# Patient Record
Sex: Male | Born: 1940 | Race: Black or African American | Hispanic: No | Marital: Single | Smoking: Never smoker
Health system: Southern US, Community
[De-identification: ages and names within clinical notes are randomized; demographics above are authoritative.]

## PROBLEM LIST (undated history)

## (undated) DIAGNOSIS — F329 Major depressive disorder, single episode, unspecified: Secondary | ICD-10-CM

## (undated) DIAGNOSIS — D649 Anemia, unspecified: Secondary | ICD-10-CM

## (undated) DIAGNOSIS — K7460 Unspecified cirrhosis of liver: Secondary | ICD-10-CM

## (undated) DIAGNOSIS — I10 Essential (primary) hypertension: Secondary | ICD-10-CM

## (undated) DIAGNOSIS — R092 Respiratory arrest: Secondary | ICD-10-CM

## (undated) DIAGNOSIS — F0390 Unspecified dementia without behavioral disturbance: Secondary | ICD-10-CM

## (undated) DIAGNOSIS — F1010 Alcohol abuse, uncomplicated: Secondary | ICD-10-CM

## (undated) DIAGNOSIS — T783XXA Angioneurotic edema, initial encounter: Secondary | ICD-10-CM

## (undated) DIAGNOSIS — F32A Depression, unspecified: Secondary | ICD-10-CM

## (undated) DIAGNOSIS — E119 Type 2 diabetes mellitus without complications: Secondary | ICD-10-CM

## (undated) DIAGNOSIS — Z8719 Personal history of other diseases of the digestive system: Secondary | ICD-10-CM

## (undated) DIAGNOSIS — E7800 Pure hypercholesterolemia, unspecified: Secondary | ICD-10-CM

## (undated) DIAGNOSIS — Z8674 Personal history of sudden cardiac arrest: Secondary | ICD-10-CM

## (undated) DIAGNOSIS — F419 Anxiety disorder, unspecified: Secondary | ICD-10-CM

## (undated) DIAGNOSIS — I4891 Unspecified atrial fibrillation: Secondary | ICD-10-CM

## (undated) HISTORY — DX: Depression, unspecified: F32.A

## (undated) HISTORY — DX: Anxiety disorder, unspecified: F41.9

## (undated) HISTORY — DX: Type 2 diabetes mellitus without complications: E11.9

## (undated) HISTORY — DX: Major depressive disorder, single episode, unspecified: F32.9

---

## 2012-02-20 DIAGNOSIS — F1010 Alcohol abuse, uncomplicated: Secondary | ICD-10-CM

## 2012-02-20 HISTORY — DX: Alcohol abuse, uncomplicated: F10.10

## 2012-05-20 DIAGNOSIS — T783XXA Angioneurotic edema, initial encounter: Secondary | ICD-10-CM

## 2012-05-20 DIAGNOSIS — Z8674 Personal history of sudden cardiac arrest: Secondary | ICD-10-CM

## 2012-05-20 DIAGNOSIS — I4891 Unspecified atrial fibrillation: Secondary | ICD-10-CM

## 2012-05-20 DIAGNOSIS — R092 Respiratory arrest: Secondary | ICD-10-CM

## 2012-05-20 HISTORY — DX: Personal history of sudden cardiac arrest: Z86.74

## 2012-05-20 HISTORY — DX: Angioneurotic edema, initial encounter: T78.3XXA

## 2012-05-20 HISTORY — DX: Respiratory arrest: R09.2

## 2012-05-20 HISTORY — DX: Unspecified atrial fibrillation: I48.91

## 2012-05-22 ENCOUNTER — Emergency Department (HOSPITAL_COMMUNITY): Payer: Medicare Other

## 2012-05-22 ENCOUNTER — Encounter (HOSPITAL_COMMUNITY): Payer: Self-pay | Admitting: Anesthesiology

## 2012-05-22 ENCOUNTER — Encounter (HOSPITAL_COMMUNITY): Payer: Self-pay | Admitting: Pulmonary Disease

## 2012-05-22 ENCOUNTER — Inpatient Hospital Stay (HOSPITAL_COMMUNITY)
Admission: EM | Admit: 2012-05-22 | Discharge: 2012-05-30 | DRG: 004 | Disposition: A | Discharge status: Home / Self Care | Payer: Medicare Other | Attending: Pulmonary Disease | Admitting: Pulmonary Disease

## 2012-05-22 DIAGNOSIS — J9600 Acute respiratory failure, unspecified whether with hypoxia or hypercapnia: Secondary | ICD-10-CM | POA: Diagnosis present

## 2012-05-22 DIAGNOSIS — T46905A Adverse effect of unspecified agents primarily affecting the cardiovascular system, initial encounter: Secondary | ICD-10-CM | POA: Diagnosis present

## 2012-05-22 DIAGNOSIS — F1010 Alcohol abuse, uncomplicated: Secondary | ICD-10-CM

## 2012-05-22 DIAGNOSIS — T85898A Other specified complication of other internal prosthetic devices, implants and grafts, initial encounter: Secondary | ICD-10-CM | POA: Diagnosis not present

## 2012-05-22 DIAGNOSIS — Y833 Surgical operation with formation of external stoma as the cause of abnormal reaction of the patient, or of later complication, without mention of misadventure at the time of the procedure: Secondary | ICD-10-CM | POA: Diagnosis not present

## 2012-05-22 DIAGNOSIS — E785 Hyperlipidemia, unspecified: Secondary | ICD-10-CM | POA: Diagnosis present

## 2012-05-22 DIAGNOSIS — J9690 Respiratory failure, unspecified, unspecified whether with hypoxia or hypercapnia: Secondary | ICD-10-CM

## 2012-05-22 DIAGNOSIS — J988 Other specified respiratory disorders: Secondary | ICD-10-CM

## 2012-05-22 DIAGNOSIS — Z430 Encounter for attention to tracheostomy: Secondary | ICD-10-CM

## 2012-05-22 DIAGNOSIS — J384 Edema of larynx: Secondary | ICD-10-CM | POA: Diagnosis present

## 2012-05-22 DIAGNOSIS — F1090 Alcohol use, unspecified, uncomplicated: Secondary | ICD-10-CM | POA: Diagnosis present

## 2012-05-22 DIAGNOSIS — I10 Essential (primary) hypertension: Secondary | ICD-10-CM

## 2012-05-22 DIAGNOSIS — Z7289 Other problems related to lifestyle: Secondary | ICD-10-CM | POA: Diagnosis present

## 2012-05-22 DIAGNOSIS — R061 Stridor: Secondary | ICD-10-CM | POA: Diagnosis present

## 2012-05-22 DIAGNOSIS — T783XXA Angioneurotic edema, initial encounter: Principal | ICD-10-CM | POA: Diagnosis present

## 2012-05-22 DIAGNOSIS — E7800 Pure hypercholesterolemia, unspecified: Secondary | ICD-10-CM | POA: Diagnosis present

## 2012-05-22 DIAGNOSIS — R1311 Dysphagia, oral phase: Secondary | ICD-10-CM | POA: Diagnosis not present

## 2012-05-22 HISTORY — DX: Essential (primary) hypertension: I10

## 2012-05-22 HISTORY — PX: TRACHEOSTOMY: SUR1362

## 2012-05-22 HISTORY — DX: Pure hypercholesterolemia, unspecified: E78.00

## 2012-05-22 LAB — URINALYSIS, ROUTINE W REFLEX MICROSCOPIC
Bilirubin Urine: NEGATIVE
Nitrite: NEGATIVE
Specific Gravity, Urine: 1.018 (ref 1.005–1.030)
Urobilinogen, UA: 1 mg/dL (ref 0.0–1.0)
pH: 5 (ref 5.0–8.0)

## 2012-05-22 LAB — CBC WITH DIFFERENTIAL/PLATELET
HCT: 33 % — ABNORMAL LOW (ref 39.0–52.0)
Hemoglobin: 11.4 g/dL — ABNORMAL LOW (ref 13.0–17.0)
Lymphocytes Relative: 38 % (ref 12–46)
Lymphs Abs: 3.7 10*3/uL (ref 0.7–4.0)
MCHC: 34.5 g/dL (ref 30.0–36.0)
Monocytes Absolute: 0.8 10*3/uL (ref 0.1–1.0)
Monocytes Relative: 8 % (ref 3–12)
Neutro Abs: 5 10*3/uL (ref 1.7–7.7)
Neutrophils Relative %: 52 % (ref 43–77)
RBC: 3.59 MIL/uL — ABNORMAL LOW (ref 4.22–5.81)
WBC: 9.7 10*3/uL (ref 4.0–10.5)

## 2012-05-22 LAB — BLOOD GAS, ARTERIAL
Acid-base deficit: 2.3 mmol/L — ABNORMAL HIGH (ref 0.0–2.0)
Drawn by: 34779
FIO2: 1 %
MECHVT: 510 mL
O2 Saturation: 100 %
RATE: 16 resp/min

## 2012-05-22 LAB — COMPREHENSIVE METABOLIC PANEL
Albumin: 3.5 g/dL (ref 3.5–5.2)
Alkaline Phosphatase: 94 U/L (ref 39–117)
BUN: 24 mg/dL — ABNORMAL HIGH (ref 6–23)
CO2: 21 mEq/L (ref 19–32)
Chloride: 96 mEq/L (ref 96–112)
Creatinine, Ser: 1.24 mg/dL (ref 0.50–1.35)
GFR calc non Af Amer: 57 mL/min — ABNORMAL LOW (ref >90)
Glucose, Bld: 117 mg/dL — ABNORMAL HIGH (ref 70–99)
Potassium: 3.4 mEq/L — ABNORMAL LOW (ref 3.5–5.1)
Total Bilirubin: 0.9 mg/dL (ref 0.3–1.2)

## 2012-05-22 LAB — URINE MICROSCOPIC-ADD ON

## 2012-05-22 LAB — PRO B NATRIURETIC PEPTIDE: Pro B Natriuretic peptide (BNP): 22.4 pg/mL (ref 0–125)

## 2012-05-22 LAB — PROTIME-INR
INR: 1.23 (ref 0.00–1.49)
Prothrombin Time: 15.3 seconds — ABNORMAL HIGH (ref 11.6–15.2)

## 2012-05-22 MED ORDER — MIDAZOLAM HCL 2 MG/2ML IJ SOLN
2.0000 mg | INTRAMUSCULAR | Status: DC | PRN
  Administered 2012-05-23: 1 mg via INTRAVENOUS
  Administered 2012-05-24: 2 mg via INTRAVENOUS
  Filled 2012-05-22: qty 2

## 2012-05-22 MED ORDER — PROPOFOL 10 MG/ML IV EMUL
INTRAVENOUS | Status: AC
  Administered 2012-05-22: 18:00:00
  Filled 2012-05-22: qty 100

## 2012-05-22 MED ORDER — EPINEPHRINE 0.15 MG/0.3ML IJ DEVI
INTRAMUSCULAR | Status: AC
  Filled 2012-05-22: qty 0.3

## 2012-05-22 MED ORDER — SODIUM CHLORIDE 0.9 % IV SOLN
INTRAVENOUS | Status: DC
  Administered 2012-05-22: 23:00:00 via INTRAVENOUS
  Filled 2012-05-22 (×6): qty 1000

## 2012-05-22 MED ORDER — MIDAZOLAM HCL 2 MG/2ML IJ SOLN
INTRAMUSCULAR | Status: AC
  Administered 2012-05-22: 2 mg
  Filled 2012-05-22: qty 2

## 2012-05-22 MED ORDER — PANTOPRAZOLE SODIUM 40 MG IV SOLR
40.0000 mg | Freq: Every day | INTRAVENOUS | Status: DC
  Administered 2012-05-22 – 2012-05-27 (×6): 40 mg via INTRAVENOUS
  Filled 2012-05-22 (×7): qty 40

## 2012-05-22 MED ORDER — BIOTENE DRY MOUTH MT LIQD
15.0000 mL | Freq: Four times a day (QID) | OROMUCOSAL | Status: DC
  Administered 2012-05-23 – 2012-05-30 (×28): 15 mL via OROMUCOSAL

## 2012-05-22 MED ORDER — SODIUM CHLORIDE 0.9 % IV SOLN: 250.0000 mL | INTRAVENOUS | Status: DC | PRN

## 2012-05-22 MED ORDER — SUCCINYLCHOLINE CHLORIDE 20 MG/ML IJ SOLN
INTRAMUSCULAR | Status: AC
  Filled 2012-05-22: qty 1

## 2012-05-22 MED ORDER — ETOMIDATE 2 MG/ML IV SOLN
INTRAVENOUS | Status: AC
  Filled 2012-05-22: qty 20

## 2012-05-22 MED ORDER — FENTANYL CITRATE 0.05 MG/ML IJ SOLN
INTRAMUSCULAR | Status: AC
  Administered 2012-05-22: 50 ug via INTRAVENOUS
  Filled 2012-05-22: qty 2

## 2012-05-22 MED ORDER — METHYLPREDNISOLONE SODIUM SUCC 125 MG IJ SOLR
INTRAMUSCULAR | Status: AC
  Filled 2012-05-22: qty 2

## 2012-05-22 MED ORDER — PROPOFOL 10 MG/ML IV BOLUS
INTRAVENOUS | Status: AC
  Filled 2012-05-22: qty 20

## 2012-05-22 MED ORDER — LIDOCAINE HCL (CARDIAC) 20 MG/ML IV SOLN
INTRAVENOUS | Status: AC
  Filled 2012-05-22: qty 5

## 2012-05-22 MED ORDER — ROCURONIUM BROMIDE 50 MG/5ML IV SOLN
INTRAVENOUS | Status: AC
  Filled 2012-05-22: qty 2

## 2012-05-22 MED ORDER — FENTANYL CITRATE 0.05 MG/ML IJ SOLN
INTRAMUSCULAR | Status: AC
  Administered 2012-05-22: 100 ug
  Filled 2012-05-22: qty 2

## 2012-05-22 MED ORDER — MIDAZOLAM BOLUS VIA INFUSION: 2.0000 mg | INTRAVENOUS | Status: DC | PRN

## 2012-05-22 MED ORDER — FENTANYL BOLUS VIA INFUSION
25.0000 ug | INTRAVENOUS | Status: DC | PRN
  Filled 2012-05-22: qty 100

## 2012-05-22 MED ORDER — CHLORHEXIDINE GLUCONATE 0.12 % MT SOLN
15.0000 mL | Freq: Two times a day (BID) | OROMUCOSAL | Status: DC
  Administered 2012-05-22 – 2012-05-30 (×16): 15 mL via OROMUCOSAL
  Filled 2012-05-22 (×18): qty 15

## 2012-05-22 MED ORDER — ENOXAPARIN SODIUM 40 MG/0.4ML ~~LOC~~ SOLN
40.0000 mg | SUBCUTANEOUS | Status: DC
  Administered 2012-05-22: 40 mg via SUBCUTANEOUS
  Filled 2012-05-22 (×2): qty 0.4

## 2012-05-22 MED ORDER — FENTANYL CITRATE 0.05 MG/ML IJ SOLN
50.0000 ug | INTRAMUSCULAR | Status: DC | PRN
  Administered 2012-05-23 (×2): 100 ug via INTRAVENOUS
  Administered 2012-05-23 (×2): 50 ug via INTRAVENOUS
  Administered 2012-05-24 – 2012-05-26 (×12): 100 ug via INTRAVENOUS
  Administered 2012-05-27 – 2012-05-28 (×4): 50 ug via INTRAVENOUS
  Administered 2012-05-28 (×4): 100 ug via INTRAVENOUS
  Administered 2012-05-29: 50 ug via INTRAVENOUS
  Administered 2012-05-29 – 2012-05-30 (×3): 100 ug via INTRAVENOUS
  Filled 2012-05-22 (×26): qty 2

## 2012-05-22 MED ORDER — FENTANYL CITRATE 0.05 MG/ML IJ SOLN
50.0000 ug | Freq: Once | INTRAMUSCULAR | Status: DC
  Filled 2012-05-22: qty 2

## 2012-05-22 MED ORDER — MIDAZOLAM HCL 2 MG/2ML IJ SOLN
2.0000 mg | Freq: Once | INTRAMUSCULAR | Status: DC
  Filled 2012-05-22: qty 2

## 2012-05-22 MED ORDER — IOHEXOL 300 MG/ML  SOLN
75.0000 mL | Freq: Once | INTRAMUSCULAR | Status: AC | PRN
  Administered 2012-05-22: 75 mL via INTRAVENOUS

## 2012-05-22 NOTE — ED Notes (Signed)
LMA palced by anesthesia.  Pt being prepped for trach at this time per Dr.Thompson.

## 2012-05-22 NOTE — ED Notes (Signed)
Pt resting quietly will respond and answer questions appropriately by nodding his head.  Pt's wife at bedside.

## 2012-05-22 NOTE — ED Notes (Signed)
 NS infusion complete, 2nd liter infusing at 125/hr.

## 2012-05-22 NOTE — ED Notes (Signed)
Pt hypotensive, Diprovan paused at this time per Dr. Janee Morn.  IV NS bolus infusing

## 2012-05-22 NOTE — ED Notes (Addendum)
Attempting to place LMA at this time by anesthesia.

## 2012-05-22 NOTE — ED Notes (Signed)
Pt being put in soft restraints.  Time out by Dr. Janee Morn and Dr. Bernette Mayers.

## 2012-05-22 NOTE — Progress Notes (Signed)
Called by nurse to ED to suction pt. Suction moderate bloody secretions from pt. Vitals are stable HR 101 RR 28 Oxygen Saturation 100%. RT will continue to monitor pt.

## 2012-05-22 NOTE — Procedures (Signed)
Brief operative note  Preoperative diagnosis: Upper airway obstruction with need for emergent airway Postoperative diagnosis:1. Upper airway obstruction with need for emergent airway     2. Significant edema within the structures of the neck Procedure: Tracheostomy a #6 Shiley Surgeon: Violeta Gelinas, M.D. Assistant: Karie Soda, M.D. Procedure: Please refer to my consult note. The patient presented with stridor to the emergency department and anesthesia was unable to secure an airway. Emergency tracheostomy was done. Emergency consent was done and we did a time out procedure. Anterior neck was prepped in a sterile fashion. 1.5% lidocaine from the [employer] trach kit was used to infiltrate 2 cm cephalad to the sternal notch. His neck is very edematous. Vertical incision was made over the midline. Subcutaneous tissues were dissected down sharply through the platysma. Anterior jugular veins were quite distended. Neck anatomy was distorted due to significant edema. Strap muscles were split sharply and the anterior surface of the trachea was exposed. Patient was hemodynamically unstable. Blue Rhino kit was used and the Angiocath was inserted into the trachea. We got into her back. Guidewire was threaded. Trachea is dilated with a small blue dilator followed by the Blue Rhino dilator. Next, we did place a #6 trach over a 24 [place] dilator. It did not thread. It was removed. Trachea was reexposed. Incision was made in the trachea itself around the second tracheal ring this was dilated with with a Kelly clamp and a endotracheal tube was placed temporarily. We were able to bag came to achieve better saturations. When that improved, endotracheal tube was removed and the #6 Shiley was placed over 24 French Blue Rhino dilator. Cuff was inflated.We had good breath sounds and air return on the ventilator. Saturations improved into the 90s. A trach dressing was placed and the tracheostomy was sutured to the skin with  2-0 Prolene's. Velcro trach tie was applied. Quarter-inch iodoform was packed on either side to assist with hemostasis. He continued to have good ventilation via the trach. Chest x-ray showed pulmonary edema possibly from negative pressure, tracheostomy in place. Some possible subcutaneous emphysema but no pneumothorax. Patient will be evaluated and admitted to the critical care medicine service. We will follow him closely. His upper airway limited to be further evaluated to see what the cause of all of this is and swallowing study or other evaluation of his esophagus will need to be done before oral intake should be allowed.  Violeta Gelinas, MD, MPH, FACS Pager: [phone]

## 2012-05-22 NOTE — H&P (Signed)
PULMONARY  / CRITICAL CARE MEDICINE  Name: Keith [name] MRN: [id number] DOB: 1940/12/17    ADMISSION DATE:  05/22/2012 CONSULTATION DATE:  05/22/2012  REFERRING MD :  Bernette Mayers PRIMARY SERVICE: PCCM  CHIEF COMPLAINT:  Angioedema  BRIEF PATIENT DESCRIPTION: 72 y/o male with acute onset stridor on 4/3 required emergent tracheostomy in the [facility] ED.  SIGNIFICANT EVENTS / STUDIES:  4/3 emergent tracheostomy in ED >> 4/3 CT neck/soft tissue with contrast >>  LINES / TUBES: 4/3 Tracheostomy >>  CULTURES:   ANTIBIOTICS:    HISTORY OF PRESENT ILLNESS:  72 y/o male on lisinopril for years for HTN developed the sensation of "something in his throat" on 4/3.  This progressed rapidly over the course of three hours and he developed stridor and respiratory failure.  In the [facility] ED he was found to have laryngeal swelling, but no tongue swelling.  He never developed cardiac arrest and his hypoxemia was able to be corrected with bag mask ventilation.  An LMA was placed and an emergent tracheostomy was performed by Dr. Janee Morn of Trauma Surgery in the ED.   His fiance is with him and states that he has been feeling well lately.  She was with him nearly all day and reported no new medications, foods, insect stings or animal contact.  He did not have itching or a rash during the episode.  PAST MEDICAL HISTORY :  Past Medical History  Diagnosis Date  . HTN (hypertension)   . Hypercholesterolemia    Past Surgical History  Procedure Laterality Date  . Tracheostomy  05/22/12    emergent, angioedema   Prior to Admission medications   Medication Sig Start Date End Date Taking? Authorizing Provider  atenolol (TENORMIN) 50 MG tablet Take 50 mg by mouth every morning.   Yes Historical Provider, MD  atorvastatin (LIPITOR) 40 MG tablet Take 40 mg by mouth every morning.   Yes Historical Provider, MD  ipratropium (ATROVENT) 0.06 % nasal spray Place 2 sprays into the nose 4 (four) times daily.   Yes  Historical Provider, MD  lisinopril (PRINIVIL,ZESTRIL) 20 MG tablet Take 20 mg by mouth every morning.   Yes Historical Provider, MD  mometasone (NASONEX) 50 MCG/ACT nasal spray Place 2 sprays into the nose daily.   Yes Historical Provider, MD   No Known Allergies  FAMILY HISTORY:  Reviewed, not relevant to current illness SOCIAL HISTORY:  reports that he drinks about 7.0 ounces of alcohol per week. He reports that he does not use illicit drugs. His tobacco history is not on file.  REVIEW OF SYSTEMS:  Cannot obtain due to intubation  SUBJECTIVE:   VITAL SIGNS: Temp:  [96.8 F (36 C)] 96.8 F (36 C) (04/03 1900) Pulse Rate:  [77-125] 96 (04/03 1900) Resp:  [9-29] 16 (04/03 1900) BP: (65-207)/(40-107) 94/49 mmHg (04/03 1900) SpO2:  [52 %-100 %] 99 % (04/03 1900) FiO2 (%):  [100 %] 100 % (04/03 1800) HEMODYNAMICS:   VENTILATOR SETTINGS: Vent Mode:  [-] PRVC FiO2 (%):  [100 %] 100 % Set Rate:  [16 bmp] 16 bmp Vt Set:  [550 mL] 550 mL PEEP:  [5 cmH20] 5 cmH20 Plateau Pressure:  [19 cmH20] 19 cmH20 INTAKE / OUTPUT: Intake/Output     04/03 0701 - 04/04 0700   I.V. 1000   Total Intake 1000   Net +1000         PHYSICAL EXAMINATION:  Gen: resting comfortably on vent HEENT: NCAT, PERRL, EOMi, Tracheostomy in place, slight oozing; no  tongue swelling, some blood in mouth, no clear pharyngeal bleeding PULM: Rhonchi bilaterally CV: RRR, no mgr, no JVD AB: BS+, soft, nontender, no hsm Ext: warm, trace edema, no clubbing, no cyanosis Derm: no rash or skin breakdown Neuro: Sedated on vent but following commands, interacting with staff; maew   LABS:  Recent Labs Lab 05/22/12 1816 05/22/12 1817 05/22/12 1831  HGB 11.4*  --   --   WBC 9.7  --   --   PLT 130*  --   --   NA 131*  --   --   K 3.4*  --   --   CL 96  --   --   CO2 21  --   --   GLUCOSE 117*  --   --   BUN 24*  --   --   CREATININE 1.24  --   --   CALCIUM 8.6  --   --   AST 106*  --   --   ALT 34  --    --   ALKPHOS 94  --   --   BILITOT 0.9  --   --   PROT 8.5*  --   --   ALBUMIN 3.5  --   --   APTT 37  --   --   INR 1.23  --   --   LATICACIDVEN  --   --  2.80*  TROPONINI  --  <0.30  --   PROBNP  --  22.4  --    No results found for this basename: GLUCAP,  in the last 168 hours  4/3 CXR: bilateral airspace disease, sub cutaneous emphysema left neck, question pneumomediastinum  ASSESSMENT / PLAN:  PULMONARY A: Stridor presumably due to lisinopril, but given question of lesions in throat seen with laryngoscopy will order CT neck to rule out a fluid/blood/pus collection; no urticaria to suggest histamine mediated (ie allergic) angioedema  Negative pressure pulmonary edema improving P:   -CT neck with contrast -tracheostomy care per surgery -full vent support overnight -SBT in AM -trypsin level   CARDIOVASCULAR A: HTN P:  -no more Lisinopril -restart other home meds 4/4  RENAL A:  No acute issues P:   -monitor UOP  GASTROINTESTINAL A:  High risk for esophageal/pharyngeal injury from repeated laryngoscopy attempts P:   -CT neck -hold OG tube 4/3 (MD could not pass in ED) -consider barium swallow before he is allowed to eat  HEMATOLOGIC A: Some oozing from trach P:  -monitor  INFECTIOUS A:  No acute issues P:   -check CT neck to look for abscess  ENDOCRINE A:  No acute issues P:   -monitor glucose  NEUROLOGIC A:   Despite respiratory failure and hypoxemia, he is following commands and interacting normally 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  Lisinopril will be our diagnosis of exclusion if his CT neck is negative.  He has no urticaria to suggest a histamine mediated process.    I have personally obtained a history, examined the patient, evaluated laboratory and imaging results, formulated the assessment and plan  and placed orders. CRITICAL CARE: The patient is critically ill with multiple organ systems failure and requires high complexity decision making for assessment and support, frequent evaluation and titration of therapies, application of advanced monitoring technologies and extensive interpretation of multiple databases. Critical Care  Time devoted to patient care services described in this note is 60 minutes.   Fonnie Jarvis Pulmonary and Critical Care Medicine [facility] Pager: [phone]  05/22/2012, 7:58 PM

## 2012-05-22 NOTE — ED Notes (Signed)
Pt continues to be assisted with ventilations by bag valve mask.

## 2012-05-22 NOTE — ED Notes (Signed)
EMS reports pt st's he was walking in [place] when he had sudden onset of resp. Difficulty.  Pt then drove himself home and called EMS.  On arrival pt is resp. Distress with Epi neb being given.  Pt was also given Benadryl 50mg  IV.  Pt would not tolerate mask.  On arrival to ED pt very anxious with strider.

## 2012-05-22 NOTE — ED Notes (Signed)
#   6 trach placed by Dr. Janee Morn.

## 2012-05-22 NOTE — Consult Note (Signed)
Airway/Intubation Note Called by ED attending physician to evaluate and intubate patient in respiratory distress. Upon arrival noted patient in respiratory distress. ED staff had initiated local anesthesia by spray. By glide scope noted significant airway swelling and edema. Unable to get a clear view of the vocal cords with glide scope and bloody airway noted with no clear area of bleeding observed. Elected not to pass tube blindly with edema and bleeding.  #4  Supreme LMA utilized successfully to maintain airway with ability to maintain O2 sats in 90s occasionally 100%. ED team had simultaneously called trauma for possible trach. After unsuccsessful glide scope attempts Dr. Janee Morn and Dr. Michaell Cowing did a trach while maintaining airway with Supreme LMA. After trach secured. Positive ETCO2 with EZ Cap. BLBS. Tube secured. Plan Care per ED team. Judie Petit MD Anesthesia

## 2012-05-22 NOTE — ED Provider Notes (Signed)
History     CSN: [id number]  Arrival date & time 05/22/12  1712   First MD Initiated Contact with Patient 05/22/12 1803      Chief Complaint  Patient presents with  . Respiratory Distress    (Consider location/radiation/quality/duration/timing/severity/associated sxs/prior treatment) HPI - Level 5 caveat due to urgent need for intevention Pt brought to the ED via EMS who provides history of worsening SOB over the course of about 3 hours while at [facility] this afternoon. On their arrival patient was wheezing, given a neb but then progressed to stridor and hypoxia. Given Benadryl and Racemic epi which helped some initially, but SpO2 and mental status continued to deteriorate prior to arrival. On arrival to the resuscitation bay, he was minimally responsive with stridor and SpO2 60%.   No past medical history on file.  No past surgical history on file.  No family history on file.  History  Substance Use Topics  . Smoking status: Not on file  . Smokeless tobacco: Not on file  . Alcohol Use: Not on file      Review of Systems Unable to assess due to mental status.   Allergies  Review of patient's allergies indicates no known allergies.  Home Medications   Current Outpatient Rx  Name  Route  Sig  Dispense  Refill  . atenolol (TENORMIN) 50 MG tablet   Oral   Take 50 mg by mouth every morning.         Marland Kitchen atorvastatin (LIPITOR) 40 MG tablet   Oral   Take 40 mg by mouth every morning.         Marland Kitchen ipratropium (ATROVENT) 0.06 % nasal spray   Nasal   Place 2 sprays into the nose 4 (four) times daily.         Marland Kitchen lisinopril (PRINIVIL,ZESTRIL) 20 MG tablet   Oral   Take 20 mg by mouth every morning.         . mometasone (NASONEX) 50 MCG/ACT nasal spray   Nasal   Place 2 sprays into the nose daily.           BP 94/49  Pulse 96  Temp(Src) 96.8 F (36 C)  Resp 16  SpO2 99%  Physical Exam  Nursing note and vitals reviewed. Constitutional: He appears  well-developed and well-nourished.  HENT:  Head: Normocephalic and atraumatic.  Eyes: Pupils are equal, round, and reactive to light.  Neck: No tracheal deviation present.  Cardiovascular: Normal rate, normal heart sounds and intact distal pulses.   Pulmonary/Chest: Stridor present. He is in respiratory distress.  Abdominal: Bowel sounds are normal. He exhibits no distension. There is no tenderness.  Musculoskeletal: Normal range of motion. He exhibits no edema and no tenderness.  Neurological:  Moves all four extremities, unable to fully assess  Skin: Skin is warm and dry. No rash noted.  Psychiatric:  Unable to assess    ED Course  Procedures (including critical care time)  Labs Reviewed  CBC WITH DIFFERENTIAL - Abnormal; Notable for the following:    RBC 3.59 (*)    Hemoglobin 11.4 (*)    HCT 33.0 (*)    Platelets 130 (*)    All other components within normal limits  COMPREHENSIVE METABOLIC PANEL - Abnormal; Notable for the following:    Sodium 131 (*)    Potassium 3.4 (*)    Glucose, Bld 117 (*)    BUN 24 (*)    Total Protein 8.5 (*)    AST 106 (*)  GFR calc non Af Amer 57 (*)    GFR calc Af Amer 66 (*)    All other components within normal limits  PROTIME-INR - Abnormal; Notable for the following:    Prothrombin Time 15.3 (*)    All other components within normal limits  URINALYSIS, ROUTINE W REFLEX MICROSCOPIC - Abnormal; Notable for the following:    Color, Urine AMBER (*)    APPearance HAZY (*)    Hgb urine dipstick MODERATE (*)    Protein, ur 100 (*)    All other components within normal limits  URINE MICROSCOPIC-ADD ON - Abnormal; Notable for the following:    Casts HYALINE CASTS (*)    All other components within normal limits  CG4 I-STAT (LACTIC ACID) - Abnormal; Notable for the following:    Lactic Acid, Venous 2.80 (*)    All other components within normal limits  APTT  TROPONIN I  PRO B NATRIURETIC PEPTIDE   Dg Chest Port 1 View  05/22/2012   *RADIOLOGY REPORT*  Clinical Data: Tracheostomy tube  PORTABLE CHEST - 1 VIEW  Comparison: None.  Findings: Diffuse airspace disease right greater than left. Tracheostomy tube in place.  Tip is 7.5 cm from the carina.  Left supraclavicular subcutaneous emphysema is suspected.  No pneumothorax.  Normal heart size.  Low volumes.  IMPRESSION: Tracheostomy tube in place.  Diffuse airspace disease worrisome for pulmonary edema.  Left supraclavicular subcutaneous emphysema is suspected.  No pneumothorax.   Original Report Authenticated By: Jolaine Click, M.D.      1. Angioedema, initial encounter   2. Respiratory failure   3. Acute tracheostomy management   4. Habitual alcohol use   5. Hyperlipidemia   6. Hypertension   7. Laryngeal edema   8. Acute respiratory failure       MDM  Respiratory at bedside on arrival, began BVM with good improvement in SpO2. Anesthesia was called on arrival and promptly arrived at bedside, Trauma Surg also called for possible trach. Please see their notes for the details of their procedures, but briefly multiple attempts at Glidescope showed markedly edematous larynx and adenoids with poorly visualized land marks. Dr. Janee Morn and Dr. Acquanetta Belling with Surgery then performed bedside tracheostomy. With airway intact now will begin workup for etiology of his airway compromise. Suspect an angioedema reaction to lisinopril, but will also eval for other soft tissue neck mass. Discussed with PCCM who will come down to evaluate the patient as well. Discussed this plan and care to this point with fiance who is at bedside. Pt initially sedated with propofol, but BP dropped. Propofol stopped and given a saline bolus with good improvement. He is tolerating trach well now, responding to verbal stimuli.   CRITICAL CARE Performed by: Pollyann Savoy   Total critical care time: 90  Critical care time was exclusive of separately billable procedures and treating other patients.  Critical  care was necessary to treat or prevent imminent or life-threatening deterioration.  Critical care was time spent personally by me on the following activities: development of treatment plan with patient and/or surrogate as well as nursing, discussions with consultants, evaluation of patient's response to treatment, examination of patient, obtaining history from patient or surrogate, ordering and performing treatments and interventions, ordering and review of laboratory studies, ordering and review of radiographic studies, pulse oximetry and re-evaluation of patient's condition.      Willine Schwalbe B. Bernette Mayers, MD 05/22/12 [phone]

## 2012-05-22 NOTE — ED Notes (Signed)
Proprofol d'cd per Dr. Janee Morn

## 2012-05-22 NOTE — ED Notes (Signed)
Dr. Janee Morn at bedside for possible trach.  Pt's throat swollen unable to visualize vocal cords.

## 2012-05-22 NOTE — Consult Note (Signed)
Reason for Consult:Emergency airway Referring Physician: Judie Petit, MD  Keith [name] is an 72 y.o. male.  HPI: Patient presented to the emergency department with stridor. Anesthesia evaluated him and was unable to get an airway. I was: Emergently for possible tracheostomy. On my arrival, patient's saturations were 1%. Anesthesia was able to place a laryngeal mask airway and bagged him. Saturations went into the 90s. Attempts were made by anesthesia with the glide scope multiple times. No good view was able to be obtained. He has significant edema in his posterior pharynx. It was very friable and bloody as well. He continued episodically desaturating. We were able to secure the laryngeal mask airway to get his saturations into the 90s and felt we needed to proceed with emergency tracheostomy. That is dictated in an operative note.  No past medical history on file.  No past surgical history on file.  No family history on file.  Social History:  has no tobacco, alcohol, and drug history on file.  Allergies: Allergies no known allergies  Medications: Prior to Admission:  (Not in a hospital admission)  Results for orders placed during the hospital encounter of 05/22/12 (from the past 48 hour(s))  CBC WITH DIFFERENTIAL     Status: Abnormal   Collection Time    05/22/12  6:16 PM      Result Value Range   WBC 9.7  4.0 - 10.5 K/uL   RBC 3.59 (*) 4.22 - 5.81 MIL/uL   Hemoglobin 11.4 (*) 13.0 - 17.0 g/dL   HCT 16.1 (*) 09.6 - 04.5 %   MCV 91.9  78.0 - 100.0 fL   MCH 31.8  26.0 - 34.0 pg   MCHC 34.5  30.0 - 36.0 g/dL   RDW 40.9  81.1 - 91.4 %   Platelets 130 (*) 150 - 400 K/uL   Neutrophils Relative 52  43 - 77 %   Neutro Abs 5.0  1.7 - 7.7 K/uL   Lymphocytes Relative 38  12 - 46 %   Lymphs Abs 3.7  0.7 - 4.0 K/uL   Monocytes Relative 8  3 - 12 %   Monocytes Absolute 0.8  0.1 - 1.0 K/uL   Eosinophils Relative 2  0 - 5 %   Eosinophils Absolute 0.2  0.0 - 0.7 K/uL   Basophils Relative 0  0 - 1 %   Basophils Absolute 0.0  0.0 - 0.1 K/uL  COMPREHENSIVE METABOLIC PANEL     Status: Abnormal   Collection Time    05/22/12  6:16 PM      Result Value Range   Sodium 131 (*) 135 - 145 mEq/L   Potassium 3.4 (*) 3.5 - 5.1 mEq/L   Chloride 96  96 - 112 mEq/L   CO2 21  19 - 32 mEq/L   Glucose, Bld 117 (*) 70 - 99 mg/dL   BUN 24 (*) 6 - 23 mg/dL   Creatinine, Ser 7.82  0.50 - 1.35 mg/dL   Calcium 8.6  8.4 - 95.6 mg/dL   Total Protein 8.5 (*) 6.0 - 8.3 g/dL   Albumin 3.5  3.5 - 5.2 g/dL   AST 213 (*) 0 - 37 U/L   ALT 34  0 - 53 U/L   Alkaline Phosphatase 94  39 - 117 U/L   Total Bilirubin 0.9  0.3 - 1.2 mg/dL   GFR calc non Af Amer 57 (*) >90 mL/min   GFR calc Af Amer 66 (*) >90 mL/min   Comment:  The eGFR has been calculated     using the CKD EPI equation.     This calculation has not been     validated in all clinical     situations.     eGFR's persistently     <90 mL/min signify     possible Chronic Kidney Disease.  PROTIME-INR     Status: Abnormal   Collection Time    05/22/12  6:16 PM      Result Value Range   Prothrombin Time 15.3 (*) 11.6 - 15.2 seconds   INR 1.23  0.00 - 1.49  APTT     Status: None   Collection Time    05/22/12  6:16 PM      Result Value Range   aPTT 37  24 - 37 seconds   Comment:            IF BASELINE aPTT IS ELEVATED,     SUGGEST PATIENT RISK ASSESSMENT     BE USED TO DETERMINE APPROPRIATE     ANTICOAGULANT THERAPY.  TROPONIN I     Status: None   Collection Time    05/22/12  6:17 PM      Result Value Range   Troponin I <0.30  <0.30 ng/mL   Comment:            Due to the release kinetics of cTnI,     a negative result within the first hours     of the onset of symptoms does not rule out     myocardial infarction with certainty.     If myocardial infarction is still suspected,     repeat the test at appropriate intervals.  PRO B NATRIURETIC PEPTIDE     Status: None   Collection Time    05/22/12   6:17 PM      Result Value Range   Pro B Natriuretic peptide (BNP) 22.4  0 - 125 pg/mL  CG4 I-STAT (LACTIC ACID)     Status: Abnormal   Collection Time    05/22/12  6:31 PM      Result Value Range   Lactic Acid, Venous 2.80 (*) 0.5 - 2.2 mmol/L    Dg Chest Port 1 View  05/22/2012  *RADIOLOGY REPORT*  Clinical Data: Tracheostomy tube  PORTABLE CHEST - 1 VIEW  Comparison: None.  Findings: Diffuse airspace disease right greater than left. Tracheostomy tube in place.  Tip is 7.5 cm from the carina.  Left supraclavicular subcutaneous emphysema is suspected.  No pneumothorax.  Normal heart size.  Low volumes.  IMPRESSION: Tracheostomy tube in place.  Diffuse airspace disease worrisome for pulmonary edema.  Left supraclavicular subcutaneous emphysema is suspected.  No pneumothorax.   Original Report Authenticated By: Jolaine Click, M.D.     ROS Blood pressure 89/50, pulse 113, resp. rate 16, SpO2 100.00%. Physical Exam  Constitutional: He appears well-developed. He appears distressed.  HENT:  Head: Normocephalic.  Eyes: Pupils are equal, round, and reactive to light.  Neck:  Neck appeared swollen and trachea somewhat deviated to the left side  Cardiovascular: Normal rate.   Respiratory: He is in respiratory distress.  Coarse bilaterally with stridor  Neurological:  Spontaneously moving all extremities, unable to vocalize at all  Skin: Skin is warm.    Assessment/Plan: Need for emergency airway. See operative note.  Heaven Meeker E 05/22/2012, 7:02 PM

## 2012-05-22 NOTE — ED Notes (Signed)
Resp. In to suction pt.  Pt attempting to cough, wife at bedside.

## 2012-05-22 NOTE — ED Notes (Signed)
Anesthesia at bedside.  Pt remains awake and alert.  Pt's resp being assisted with bag valve mask.

## 2012-05-22 NOTE — ED Notes (Signed)
NS bolus infusing in right AC.

## 2012-05-22 NOTE — ED Notes (Signed)
Dr. Janee Morn placing trach at this time O2 sats 60%.

## 2012-05-23 ENCOUNTER — Inpatient Hospital Stay (HOSPITAL_COMMUNITY): Payer: Medicare Other

## 2012-05-23 LAB — BASIC METABOLIC PANEL
CO2: 20 mEq/L (ref 19–32)
Calcium: 8.9 mg/dL (ref 8.4–10.5)
Creatinine, Ser: 1.01 mg/dL (ref 0.50–1.35)
GFR calc non Af Amer: 73 mL/min — ABNORMAL LOW (ref >90)
Glucose, Bld: 136 mg/dL — ABNORMAL HIGH (ref 70–99)

## 2012-05-23 LAB — CBC
MCH: 32.3 pg (ref 26.0–34.0)
MCHC: 35.7 g/dL (ref 30.0–36.0)
MCV: 90.5 fL (ref 78.0–100.0)
Platelets: 107 10*3/uL — ABNORMAL LOW (ref 150–400)
RBC: 3.47 MIL/uL — ABNORMAL LOW (ref 4.22–5.81)

## 2012-05-23 LAB — GLUCOSE, CAPILLARY: Glucose-Capillary: 113 mg/dL — ABNORMAL HIGH (ref 70–99)

## 2012-05-23 LAB — PROTIME-INR: Prothrombin Time: 15.9 seconds — ABNORMAL HIGH (ref 11.6–15.2)

## 2012-05-23 LAB — APTT: aPTT: 41 seconds — ABNORMAL HIGH (ref 24–37)

## 2012-05-23 NOTE — Progress Notes (Signed)
Chaplain visited pt fiancee at Ed consultation room B at the request of a nurse. Pt was being accessed by ED doctors in Trauma A to determine the cause of his sob. Chaplain provided ministry of presence and empathically listened to fiancee's story until she was able to visit with pt in the trauma bay. Pt was later admitted to 2110. Family thanked chaplain for his presence and support. Kelle Darting [phone]  05/22/12 2220  Clinical Encounter Type  Visited With Family  Visit Type Initial;Spiritual support;Critical Care;ED;Trauma  Referral From Nurse  Spiritual Encounters  Spiritual Needs Emotional  Stress Factors  Patient Stress Factors Health changes  Family Stress Factors Other (Comment) (prognosis)

## 2012-05-23 NOTE — Progress Notes (Signed)
Patient is alert ans awake.  Wants ice chips.  Still bleeding around tracheostomy site.  Able to remove previous packing and repack for some control.  External drain dressings applied.  Will reassess in the near future.  Check coags again.  Patient probably has some anterior jugular venous bleeding that cannot be controlled surgically at this point because he needs his airway.  If it continues may need to take the patient to the OR for re-intubation and exploration and control of bleeding.  Marta Lamas. Gae Bon, MD, FACS [phone] Trauma Surgeon

## 2012-05-23 NOTE — Progress Notes (Signed)
Dr Lindie Spruce made aware of bleeding around tracheostomy site and new bleeding in urine.   Dawson Bills, RN

## 2012-05-23 NOTE — Progress Notes (Signed)
Care of pt assumed by Connye Burkitt RN at 3:30am.  Upon assessment of the patient I noticed a change in his urine.  It was obviously bloody in the urimeter and yellow in the foley bag.  MD notified.  Coags normal.  Pt denies trauma or pain.  Alicia Amel RN

## 2012-05-23 NOTE — Progress Notes (Signed)
PULMONARY  / CRITICAL CARE MEDICINE  Name: Keith [name] MRN: [id number] DOB: [birth year]    ADMISSION DATE:  05/22/2012 CONSULTATION DATE:  05/22/2012  REFERRING MD :  Bernette Mayers PRIMARY SERVICE: PCCM  CHIEF COMPLAINT:  Angioedema  BRIEF PATIENT DESCRIPTION: 72 y/o male with acute onset stridor on 4/3 required emergent tracheostomy in the [facility] ED.  SIGNIFICANT EVENTS / STUDIES:  4/3 emergent tracheostomy in ED >> 4/3 CT neck/soft tissue with contrast >>  LINES / TUBES: 4/3 Tracheostomy >>  CULTURES:   ANTIBIOTICS:    HISTORY OF PRESENT ILLNESS:  72 y/o male on lisinopril for years for HTN developed the sensation of "something in his throat" on 4/3.  This progressed rapidly over the course of three hours and he developed stridor and respiratory failure.  In the [facility] ED he was found to have laryngeal swelling, but no tongue swelling.  He never developed cardiac arrest and his hypoxemia was able to be corrected with bag mask ventilation.  An LMA was placed and an emergent tracheostomy was performed by Dr. Janee Morn of Trauma Surgery in the ED.   His fiance is with him and states that he has been feeling well lately.  She was with him nearly all day and reported no new medications, foods, insect stings or animal contact.  He did not have itching or a rash during the episode.   SUBJECTIVE:  He is feeling better.  Tolerating TC Bleeding from trach site, continued ooze   VITAL SIGNS: Temp:  [96.8 F (36 C)-98.5 F (36.9 C)] 98.5 F (36.9 C) (04/04 0800) Pulse Rate:  [77-125] 83 (04/04 0800) Resp:  [9-29] 18 (04/04 0800) BP: (65-207)/(40-107) 131/74 mmHg (04/04 0800) SpO2:  [52 %-100 %] 100 % (04/04 0800) FiO2 (%):  [40 %-100 %] 40 % (04/04 0740) Weight:  [204 lb 2.3 oz (92.6 kg)] 204 lb 2.3 oz (92.6 kg) (04/04 0300) HEMODYNAMICS:   VENTILATOR SETTINGS: Vent Mode:  [-] PRVC FiO2 (%):  [40 %-100 %] 40 % Set Rate:  [16 bmp] 16 bmp Vt Set:  [510 mL-550 mL] 510 mL PEEP:  [5  cmH20] 5 cmH20 Plateau Pressure:  [13 cmH20-30 cmH20] 17 cmH20 INTAKE / OUTPUT: Intake/Output     04/03 0701 - 04/04 0700 04/04 0701 - 04/05 0700   I.V. (mL/kg) 1760 (19)    Total Intake(mL/kg) 1760 (19)    Urine (mL/kg/hr) 1000    Total Output 1000     Net +760            PHYSICAL EXAMINATION: Gen: resting comfortably on trach HEENT: NCAT, PERRL, EOMi, Tracheostomy in place still bleed briskly; no tongue swelling, some old blood in pharynx PULM: Rhonchi bilaterally, decreased breath sound at bases L>>R  CV: RRR, no mgr, no JVD  AB: BS+, soft, nontender, no hsm Ext: warm, no edema, no clubbing, no cyanosis Derm: no rash or skin breakdown  Neuro: alert, asking questions  LABS:  Recent Labs Lab 05/22/12 1816 05/22/12 1817 05/22/12 1831 05/22/12 2245 05/23/12 0555 05/23/12 0635  HGB 11.4*  --   --   --  11.2*  --   WBC 9.7  --   --   --  10.2  --   PLT 130*  --   --   --  107*  --   NA 131*  --   --   --  135  --   K 3.4*  --   --   --  4.3  --  CL 96  --   --   --  100  --   CO2 21  --   --   --  20  --   GLUCOSE 117*  --   --   --  136*  --   BUN 24*  --   --   --  29*  --   CREATININE 1.24  --   --   --  1.01  --   CALCIUM 8.6  --   --   --  8.9  --   AST 106*  --   --   --   --   --   ALT 34  --   --   --   --   --   ALKPHOS 94  --   --   --   --   --   BILITOT 0.9  --   --   --   --   --   PROT 8.5*  --   --   --   --   --   ALBUMIN 3.5  --   --   --   --   --   APTT 37  --   --   --   --  41*  INR 1.23  --   --   --   --  1.30  LATICACIDVEN  --   --  2.80*  --   --   --   TROPONINI  --  <0.30  --   --   --   --   PROBNP  --  22.4  --   --   --   --   PHART  --   --   --  7.323*  --   --   PCO2ART  --   --   --  45.4*  --   --   PO2ART  --   --   --  392.0*  --   --     Recent Labs Lab 05/23/12 0027 05/23/12 0353 05/23/12 0828  GLUCAP 102* 113* 140*    4/3 CXR:  pulm edema improved significantly overnight.  Possible left atelectasis  ASSESSMENT  / PLAN:  PULMONARY A: Stridor presumably due to lisinopril. CT showed diffuse pharyngeal and upper airway edema from an unknown process. no urticaria to suggest histamine mediated (ie allergic) angioedema. Negative pressure pulmonary edema improving seen on CXR. P:   -tracheostomy care per surgery -try trach collar today >> try to go 24x7 -Pulmonary hygiene -speech rx for cuff down and PMV -will ask ENT to eval if cannot tolerate cuff down/PMV  CARDIOVASCULAR A: HTN P:  -avoid Lisinopril -resume home meds once he can tolerate PO  RENAL A:  Mild AKI. Cr 1.24--> 1.01 resolved P:   -monitor UOP  GASTROINTESTINAL A:  High risk for esophageal/pharyngeal injury from repeated laryngoscopy attempt. CT neck showed diffuse pharyngeal and upper airway edema from an unknown process. P:   -Will get barium swallow before he is allowed to eat if recommended by speech rx  HEMATOLOGIC A: Hb slightly low at 11.2 likely due to bleeding from trach. Plt is130 --> 107, likely due to alcoholic liver disease P:  -monitor CBC -dc Lovenox -SCD  INFECTIOUS A:  No acute issues. No leukocytosis.  Afebrile.  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 CT neck is negative for inflammatory processes.  Trach collar today to see if he can tolerate. If continue to bleed briskly, surgery may take him to OR for re-intubation.  Will transfer to SDU 4/5 if tolerates ATC all night    HO,MICHELE,PGY-3  05/23/2012, 8:59 AM  Levy Pupa, MD, PhD 05/23/2012, 2:03 PM [facility] [phone] or if no answer [phone]

## 2012-05-23 NOTE — Progress Notes (Signed)
INITIAL NUTRITION ASSESSMENT  DOCUMENTATION CODES Per approved criteria  -Obesity Unspecified   INTERVENTION:  Recommend swallow evaluation with SLP prior to initiating PO diet.  If unable to start PO diet within the next 48 hours, recommend initiate TF via NG tube with Jevity 1.2 at 20 ml/h, increase by 10 ml every 4 hours to goal rate of 60 ml/h with Prostat 30 ml once daily to provide 1828 kcals, 95 gm protein, 1166 ml free water daily.  NUTRITION DIAGNOSIS: Inadequate oral intake related to inability to eat as evidenced by NPO status.   Goal: Intake to meet >90% of estimated nutrition needs.  Monitor:  Diet advancement vs need to start TF, labs, weight trend.  Reason for Assessment: trach; unable to eat  72 y.o. male  Admitting Dx: Laryngeal edema  ASSESSMENT: Patient S/P emergent trach placement last night for angioedema.  Currently on trach collar. Still with a lot of bleeding around trach site this morning. Unsafe for swallow evaluation or PO's at this time per discussion with RN.    Height: Ht Readings from Last 1 Encounters:  05/22/12 5\' 6"  (1.676 m)    Weight: Wt Readings from Last 1 Encounters:  05/23/12 204 lb 2.3 oz (92.6 kg)    Ideal Body Weight: 64.5   % Ideal Body Weight: 144%  Wt Readings from Last 10 Encounters:  05/23/12 204 lb 2.3 oz (92.6 kg)    Usual Body Weight: unknown  BMI:  Body mass index is 32.97 kg/(m^2). obesity, class 1  Estimated Nutritional Needs: Kcal: 1850-2050 Protein: 95-105 gm Fluid: 1.9-2.1 L  Skin: no problems noted  Diet Order:  NPO  EDUCATION NEEDS: -Education not appropriate at this time   Intake/Output Summary (Last 24 hours) at 05/23/12 1034 Last data filed at 05/23/12 1000  Gross per 24 hour  Intake   2045 ml  Output   1000 ml  Net   1045 ml    Last BM: PTA   Labs:   Recent Labs Lab 05/22/12 1816 05/23/12 0555  NA 131* 135  K 3.4* 4.3  CL 96 100  CO2 21 20  BUN 24* 29*  CREATININE 1.24  1.01  CALCIUM 8.6 8.9  GLUCOSE 117* 136*    CBG (last 3)   Recent Labs  05/23/12 0027 05/23/12 0353 05/23/12 0828  GLUCAP 102* 113* 140*    Scheduled Meds: . antiseptic oral rinse  15 mL Mouth Rinse QID  . chlorhexidine  15 mL Mouth Rinse BID  . enoxaparin (LOVENOX) injection  40 mg Subcutaneous Q24H  . fentaNYL  50 mcg Intravenous Once  . midazolam  2 mg Intravenous Once  . pantoprazole (PROTONIX) IV  40 mg Intravenous QHS    Continuous Infusions: . banana bag IV 1000 mL ** MVI currently unavailable ** 75 mL/hr at 05/22/12 2328    Past Medical History  Diagnosis Date  . HTN (hypertension)   . Hypercholesterolemia     Past Surgical History  Procedure Laterality Date  . Tracheostomy  05/22/12    emergent, angioedema    Joaquin Courts, RD, LDN, CNSC Pager [phone] After Hours Pager [phone]

## 2012-05-23 NOTE — Progress Notes (Addendum)
Dr Delton Coombes made aware of patients continued bleeding around tracheostomy requiring multiple dressing changes and blood present in urine. No new orders at this time.   Dawson Bills, RN

## 2012-05-24 LAB — CBC
HCT: 29 % — ABNORMAL LOW (ref 39.0–52.0)
Hemoglobin: 10.2 g/dL — ABNORMAL LOW (ref 13.0–17.0)
MCH: 31.9 pg (ref 26.0–34.0)
MCHC: 35.2 g/dL (ref 30.0–36.0)

## 2012-05-24 LAB — BASIC METABOLIC PANEL
BUN: 24 mg/dL — ABNORMAL HIGH (ref 6–23)
CO2: 27 mEq/L (ref 19–32)
Calcium: 9 mg/dL (ref 8.4–10.5)
GFR calc non Af Amer: 89 mL/min — ABNORMAL LOW (ref >90)
Glucose, Bld: 120 mg/dL — ABNORMAL HIGH (ref 70–99)
Potassium: 3.9 mEq/L (ref 3.5–5.1)

## 2012-05-24 MED ORDER — DEXAMETHASONE SODIUM PHOSPHATE 10 MG/ML IJ SOLN
10.0000 mg | Freq: Three times a day (TID) | INTRAMUSCULAR | Status: AC
  Administered 2012-05-24 – 2012-05-26 (×6): 10 mg via INTRAVENOUS
  Filled 2012-05-24 (×8): qty 1

## 2012-05-24 MED ORDER — FAMOTIDINE IN NACL 20-0.9 MG/50ML-% IV SOLN
20.0000 mg | Freq: Two times a day (BID) | INTRAVENOUS | Status: DC
  Administered 2012-05-24 – 2012-05-27 (×8): 20 mg via INTRAVENOUS
  Filled 2012-05-24 (×10): qty 50

## 2012-05-24 MED ORDER — SODIUM CHLORIDE 0.9 % IV SOLN
2.0000 g | Freq: Three times a day (TID) | INTRAVENOUS | Status: AC
  Administered 2012-05-24 – 2012-05-28 (×14): 2 g via INTRAVENOUS
  Filled 2012-05-24 (×15): qty 2000

## 2012-05-24 MED ORDER — DIPHENHYDRAMINE HCL 50 MG/ML IJ SOLN
25.0000 mg | Freq: Four times a day (QID) | INTRAMUSCULAR | Status: DC
  Administered 2012-05-24 – 2012-05-27 (×12): 25 mg via INTRAVENOUS
  Filled 2012-05-24: qty 1
  Filled 2012-05-24 (×4): qty 0.5
  Filled 2012-05-24: qty 1
  Filled 2012-05-24: qty 0.5
  Filled 2012-05-24 (×2): qty 1
  Filled 2012-05-24 (×2): qty 0.5
  Filled 2012-05-24: qty 1
  Filled 2012-05-24: qty 0.5
  Filled 2012-05-24 (×2): qty 1
  Filled 2012-05-24: qty 0.5
  Filled 2012-05-24: qty 1
  Filled 2012-05-24 (×2): qty 0.5
  Filled 2012-05-24: qty 1
  Filled 2012-05-24: qty 0.5

## 2012-05-24 MED ORDER — THIAMINE HCL 100 MG/ML IJ SOLN
INTRAVENOUS | Status: DC
  Administered 2012-05-24 – 2012-05-27 (×3): via INTRAVENOUS
  Filled 2012-05-24 (×11): qty 1000

## 2012-05-24 NOTE — Evaluation (Addendum)
[facility]y-[facility] Speaking Valve - Evaluation Patient Details  Name: Keith [name] MRN: [id number] Date of Birth: 12-18-1940  Today's Date: 05/24/2012 Time: 7829-5621 SLP Time Calculation (min): 22 min  Past Medical History:  Past Medical History  Diagnosis Date  . HTN (hypertension)   . Hypercholesterolemia    Past Surgical History:  Past Surgical History  Procedure Laterality Date  . Tracheostomy  05/22/12    emergent, angioedema   HPI:  72 yo male admitted to [facility] with acute onset of stridor over 3 hours on 05/22/12, ? allergic reaction to Lisinopril.  Pt required emergent intubation and surgical trach placement after unable to be placed via laryngoscope after multiple attempts due to excessive bleeding/lack of view.  Per CT, pt with diffuse pharyngeal and upper airway edema - unknown source.  CXR 05/22/12 suggest pulmonary edema rather than infiltrates.     Assessment / Plan / Recommendation Clinical Impression  Pt nearly aphonic with phonation attempt using PMSV.  Pt quickly reported sensation of "clogging" in trach within 10 seconds of PMSV placement, SLP removed valve and pt immediately coughed and expectorated approx 1/2 tsp amount of moderately viscous blood tinged secretions.  Suspect edema impacting pt's tolerance, recommend continue cuff deflation if MD agrees.  Also recommend SLP to follow up for PMSV trials with SLP only, hopeful for improvement with decreased edema.   In the interim, pt tolerated finger occlusion on trach for communication.    Pt given communication board and he demonstrated adequate usage, also pt using writing to communicate.        SLP Assessment  Patient needs continued Speech Lanaguage Pathology Services    Follow Up Recommendations    TBD   Frequency and Duration min 2x/week  1 week   Pertinent Vitals/Pain Afebrile, decreased, appears comfortable    SLP Goals Potential to Achieve Goals: Fair SLP Goal #1: Pt will tolerate PMSV for 3 minutes with  all vital signs stable and phonation ability with min assist.   SLP Goal #2: Pt will demonstrate expectoration of secretions via oral cavity with PMSV in place with min assist.    PMSV Trial  PMSV was placed for: phonation ability given pt has trach Able to redirect subglottic air through upper airway: Yes Able to Attain Phonation: Yes Voice Quality: Low vocal intensity;Aphonic (pt essentially aphonic, minimal phonation w/maximum effort) Able to Expectorate Secretions: Yes Level of Secretion Expectoration with PMSV: Tracheal Breath Support for Phonation: Adequate Intelligibility: Intelligible Respirations During Trial: 19 Pulse During Trial: 101 Behavior: Alert;Expresses self well;Smiling;Responsive to questions;Good eye contact   Tracheostomy Tube    #6 cuffed   Vent Dependency    not on vent   Cuff Deflation Trial Tolerated Cuff Deflation: Yes Length of Time for Cuff Deflation Trial: approx 10 minutes, ongoing after SLP left pt in room Behavior: Alert;Good eye contact;Cooperative;Responsive to questions;Expresses self well;Controlled Cuff Deflation Trial - Comments: some cough and expectoration of moderately viscous blood tinged secretions noted   Donavan Burnet, MS [facility] SLP [phone]

## 2012-05-24 NOTE — Progress Notes (Signed)
PULMONARY  / CRITICAL CARE MEDICINE  Name: Keith [name] MRN: [id number] DOB: [birth year]    ADMISSION DATE:  05/22/2012 CONSULTATION DATE:  05/22/2012  REFERRING MD :  Bernette Mayers PRIMARY SERVICE: PCCM  CHIEF COMPLAINT:  Angioedema  BRIEF PATIENT DESCRIPTION: 72 y/o male with acute onset stridor on 4/3 required emergent tracheostomy in the [facility] ED.  SIGNIFICANT EVENTS / STUDIES:  4/3 emergent tracheostomy in ED >> 4/3 CT neck/soft tissue with contrast >> no mass or abscess, widespread swelling   LINES / TUBES: 4/3 Tracheostomy (emergent) >>  CULTURES:  ANTIBIOTICS:   HISTORY OF PRESENT ILLNESS:  72 y/o male on lisinopril for years for HTN developed the sensation of "something in his throat" on 4/3.  This progressed rapidly over the course of three hours and he developed stridor and respiratory failure.  In the [facility] ED he was found to have laryngeal swelling, but no tongue swelling.  He never developed cardiac arrest and his hypoxemia was able to be corrected with bag mask ventilation.  An LMA was placed and an emergent tracheostomy was performed by Dr. Janee Morn of Trauma Surgery in the ED.   His fiance is with him and states that he has been feeling well lately.  She was with him nearly all day and reported no new medications, foods, insect stings or animal contact.  He did not have itching or a rash during the episode.   SUBJECTIVE:  Vent free 24x7 Poor air movement and no real phonation when cuff down 4/5 Still with oozing from trach although seems to be improving > he required suctioning of clots o/n, associated with transient desats.   VITAL SIGNS: Temp:  [98.2 F (36.8 C)-100.1 F (37.8 C)] 100.1 F (37.8 C) (04/05 0700) Pulse Rate:  [70-97] 83 (04/05 0700) Resp:  [16-24] 22 (04/05 0700) BP: (117-155)/(59-85) 155/85 mmHg (04/05 0700) SpO2:  [0 %-100 %] 0 % (04/05 0757) FiO2 (%):  [28 %] 28 % (04/05 0600) HEMODYNAMICS:   VENTILATOR SETTINGS: Vent Mode:  [-]  FiO2 (%):   [28 %] 28 % INTAKE / OUTPUT: Intake/Output     04/04 0701 - 04/05 0700 04/05 0701 - 04/06 0700   I.V. (mL/kg) 2105 (22.7)    Total Intake(mL/kg) 2105 (22.7)    Urine (mL/kg/hr) 3090 (1.4)    Total Output 3090     Net -985            PHYSICAL EXAMINATION: Gen: resting comfortably  HEENT: NCAT, PERRL, EOMi, Tracheostomy in place still w blood on gauze; no tongue swelling, some old blood in pharynx PULM: Rhonchi bilaterally, decreased breath sound at bases L>>R  CV: RRR, no mgr, no JVD  AB: BS+, soft, nontender, no hsm Ext: warm, no edema, no clubbing, no cyanosis Derm: no rash or skin breakdown  Neuro: alert, asking questions  LABS:  Recent Labs Lab 05/22/12 1816 05/22/12 1817 05/22/12 1831 05/22/12 2245 05/23/12 0555 05/23/12 0635 05/24/12 0425  HGB 11.4*  --   --   --  11.2*  --  10.2*  WBC 9.7  --   --   --  10.2  --  9.8  PLT 130*  --   --   --  107*  --  105*  NA 131*  --   --   --  135  --  137  K 3.4*  --   --   --  4.3  --  3.9  CL 96  --   --   --  100  --  102  CO2 21  --   --   --  20  --  27  GLUCOSE 117*  --   --   --  136*  --  120*  BUN 24*  --   --   --  29*  --  24*  CREATININE 1.24  --   --   --  1.01  --  0.78  CALCIUM 8.6  --   --   --  8.9  --  9.0  AST 106*  --   --   --   --   --   --   ALT 34  --   --   --   --   --   --   ALKPHOS 94  --   --   --   --   --   --   BILITOT 0.9  --   --   --   --   --   --   PROT 8.5*  --   --   --   --   --   --   ALBUMIN 3.5  --   --   --   --   --   --   APTT 37  --   --   --   --  41*  --   INR 1.23  --   --   --   --  1.30  --   LATICACIDVEN  --   --  2.80*  --   --   --   --   TROPONINI  --  <0.30  --   --   --   --   --   PROBNP  --  22.4  --   --   --   --   --   PHART  --   --   --  7.323*  --   --   --   PCO2ART  --   --   --  45.4*  --   --   --   PO2ART  --   --   --  392.0*  --   --   --     Recent Labs Lab 05/23/12 0353 05/23/12 0828 05/23/12 1221 05/23/12 1615 05/23/12 1947  GLUCAP  113* 140* 125* 120* 110*    4/3 CXR:  pulm edema improved significantly overnight.  Possible left atelectasis  ASSESSMENT / PLAN:  PULMONARY A: Stridor, then VDRF, presumably due to lisinopril. CT showed diffuse pharyngeal and upper airway edema from an unknown process. no urticaria to suggest histamine mediated (ie allergic) angioedema. Negative pressure pulmonary edema, improved 4/3 CXR. P:   -will ask ENT to help manage timing and location of decanulation (ie in OR), also to assess bleeding; suspect that there is nothing surgically to be done about the oozing at this time (short of ET intubation) as he would not tolerate trach removal for exploration -Trach collar 24x7 -Pulmonary hygiene -speech rx following for improvement phonation w cuff down and PMV  CARDIOVASCULAR A: HTN P:  -add Lisinopril to allergy list -resume home meds once he can tolerate PO  RENAL A:  Mild AKI. Cr 1.24--> 1.01 resolved P:   -monitor UOP  GASTROINTESTINAL A:  High risk for esophageal/pharyngeal injury from repeated laryngoscopy attempt. CT neck showed diffuse pharyngeal and upper airway edema from an unknown process. P:   -Will get barium swallow before he is allowed to eat if  recommended by speech rx  HEMATOLOGIC A: Hb slightly low at 11.2 likely due to bleeding from trach. Plt is130 --> 107 -> 105, likely due to alcoholic liver disease P:  -monitor CBC -dc'd Lovenox -SCD  INFECTIOUS A:  No acute issues. No leukocytosis.  Afebrile.  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

## 2012-05-24 NOTE — Consult Note (Addendum)
Keith [name], Keith [name] [id number] 12-30-1940 Keith Leash, MD  Reason for Consult: angioedema due to lisinopril s/p emergent tracheotomy by general surgery  HPI: 72 yo AAM on lisinopril who had episode of severe angioedema on 05/22/12, anesthesia could not intubate with glidescope in Er so an LMA was placed and he was trach'ed in the ER by Dr. Janee Morn, 6 cuffed shiley trach. Has has persistent laryngeal edema and unable to vocalize but is alert & oriented, ENT is consulted to evaluate the angioedema and assess for possibility of downsizing trach/decannulation at some point.  Allergies:  Allergies  Allergen Reactions  . Lisinopril Other (See Comments)    Presented with laryngeal edema - likely caused is lisinopril     ROS: throat swelling, dyspnea, otherwise negative x 10 systems except per HPI.  PMH:  Past Medical History  Diagnosis Date  . HTN (hypertension)   . Hypercholesterolemia     FH: No family history on file.  SH:  History   Social History  . Marital Status: Single    Spouse Name: N/A    Number of Children: N/A  . Years of Education: N/A   Occupational History  . Not on file.   Social History Main Topics  . Smoking status: Unknown If Ever Smoked  . Smokeless tobacco: Not on file  . Alcohol Use: 7.0 oz/week    14 drink(s) per week     Comment: drinks daily  . Drug Use: No  . Sexually Active: Not on file   Other Topics Concern  . Not on file   Social History Narrative  . No narrative on file    PSH:  Past Surgical History  Procedure Laterality Date  . Tracheostomy  05/22/12    emergent, angioedema    Physical  Exam: CN 2-12 grossly intact and symmetric. Unable to speak but mouths words. EAC/TMs normal BL. Oral cavity mallampatti 3 with midline uvula and some pharyngeal secretions. Skin warm and dry. Nasal cavity without polyps or purulence. External nose and ears without masses or lesions. EOMI, PERRLA. Neck shows a 6 cuffed shiley trach with some  dried blood around the trach and trach is secure with ties and prolenes. Some bloody secretions through trach.   Procedure Note: 31575 flexible fiberoptic laryngoscopy/tracheoscopy. Informed verbal consent was obtained after explaining the risks (including bleeding and infection), benefits and alternatives of the procedure. Verbal timeout was performed prior to the procedure.The 4mm flexible scope was advanced through the nasal cavity. The septum and turbinates appeared normal. The middle meatus was free of polyps or purulence. The choana, posterior pharynx, hypopharynx, arytenoids, false vocal folds, and true vocal folds and subglottis could not be visualized due to copious inspissated mucous, secretions, and bloody secretions. Tracheoscopy demonstated the trach in good position superior to the carina with normal tracheal anatomy. The patient tolerated the procedure with no immediate complications.  A/P: s/p emergency tracheotomy for severe angioedema. Will let trach site heal/stomatize and downsize at bedside in 7-10 days. Once downsized could try trach capping trials but patient will likely need to go home with trach and home health arrangements for home suction and trach supplies, and follow up in our office to re-scope and possibly decannulate if his laryngeal/pharyngeal edema improves. Will add decadron, benadryl, and pepcid for the angioedema in the meantime. Will defer to speech on diet but may have dysphagia for extended period due to edema and fresh trach.   Keith [name] 05/24/2012 11:03 AM

## 2012-05-24 NOTE — Progress Notes (Signed)
Patient doing very well.  Still has some clots coming from the trach site.  We will be available to assist with any future work on his tracheostomy, but I do think that it would be appropriate for ENT to see this patient and assess his upper airway.  He is still having some clots come out of trach with plugging intermittently.  Marta Lamas. Gae Bon, MD, FACS [phone] Trauma Surgeon

## 2012-05-24 NOTE — Progress Notes (Signed)
Patient having difficulty breathing. Suspected Blood Clot and possibly plugging in the lung. Lavaged and Bagged patient with suctioning and patient coughed up three big clots and was suctioned for a lot of smaller clots. Patient felt better post suctioning and bag/lavaging. RR decreased and SATs increased. Deflated patients cuff for a small time to see if it would help and patient was able to breath around trach and coughed up a moderate amount more of thick bloody and brown secretions with clots in it also. re inflated cuff prior to the bag and lavaging and patient did very well and still was able to cough up around the cuff. Will continue to monitor Plugging issues. Assisted by RN Leitha Schuller throughout procedure.

## 2012-05-25 LAB — CBC
HCT: 30.1 % — ABNORMAL LOW (ref 39.0–52.0)
Hemoglobin: 10.6 g/dL — ABNORMAL LOW (ref 13.0–17.0)
MCHC: 35.2 g/dL (ref 30.0–36.0)
RBC: 3.3 MIL/uL — ABNORMAL LOW (ref 4.22–5.81)

## 2012-05-25 LAB — BASIC METABOLIC PANEL
BUN: 19 mg/dL (ref 6–23)
CO2: 28 mEq/L (ref 19–32)
GFR calc non Af Amer: >90 mL/min (ref >90)
Glucose, Bld: 141 mg/dL — ABNORMAL HIGH (ref 70–99)
Potassium: 4.2 mEq/L (ref 3.5–5.1)

## 2012-05-25 MED ORDER — LABETALOL HCL 5 MG/ML IV SOLN: 20.0000 mg | INTRAVENOUS | Status: DC | PRN

## 2012-05-25 NOTE — Progress Notes (Signed)
Patient ID: Keith [name], male   DOB: [birth year], 72 y.o.   MRN: [id number]    Subjective: On HTC  Objective: Vital signs in last 24 hours: Temp:  [98 F (36.7 C)-100 F (37.8 C)] 98 F (36.7 C) (04/06 0714) Pulse Rate:  [80-104] 85 (04/06 0714) Resp:  [11-33] 17 (04/06 0714) BP: (138-172)/(75-99) 172/96 mmHg (04/06 0714) SpO2:  [94 %-100 %] 100 % (04/06 0714) FiO2 (%):  [28 %] 28 % (04/06 0714) Weight:  [92.8 kg (204 lb 9.4 oz)] 92.8 kg (204 lb 9.4 oz) (04/06 0500) Last BM Date:  (Prior to admission)  Intake/Output from previous day: 04/05 0701 - 04/06 0700 In: 607.5 [I.V.:557.5; IV Piggyback:50] Out: 1600 [Urine:1600] Intake/Output this shift: Total I/O In: 75 [I.V.:75] Out: 700 [Urine:700]  General appearance: cooperative Neck: trach in place with some blood tinged secretions Resp: clear after cough  Lab Results: CBC   Recent Labs  05/24/12 0425 05/25/12 0603  WBC 9.8 5.5  HGB 10.2* 10.6*  HCT 29.0* 30.1*  PLT 105* 113*   BMET  Recent Labs  05/24/12 0425 05/25/12 0603  NA 137 135  K 3.9 4.2  CL 102 100  CO2 27 28  GLUCOSE 120* 141*  BUN 24* 19  CREATININE 0.78 0.71  CALCIUM 9.0 9.1   PT/INR  Recent Labs  05/22/12 1816 05/23/12 0635  LABPROT 15.3* 15.9*  INR 1.23 1.30   ABG  Recent Labs  05/22/12 2245  PHART 7.323*  HCO3 22.9    Studies/Results: No results found.  Anti-infectives: Anti-infectives   Start     Dose/Rate Route Frequency Ordered Stop   05/24/12 1530  ampicillin (OMNIPEN) 2 g in sodium chloride 0.9 % 50 mL IVPB    Comments:  For acute pharyngitis   2 g 150 mL/hr over 20 Minutes Intravenous 3 times per day 05/24/12 1435 05/29/12 1359      Assessment/Plan: Oropharyngeal edema ? Due to lisinopril/angioedema S/P emergency trach in ED - appreciate ENT eval and plan for F/U, I spoke to his daughter at the bedside and answered her questions   LOS: 3 days    Violeta Gelinas, MD, MPH, FACS Pager: [phone]   05/25/2012

## 2012-05-25 NOTE — Progress Notes (Signed)
POD#3 from emergent tracheotomy for severe angioedema. ENT called to bedside because of trach site bleeding.   When seen patient stable, awake, alert and breathing well, has bright red oozing from inferior trach site with some vaseline and 4x4 gauze placed loosely around trach site. 6 cuffed Shiley in place and secure with cuff down.  I re-inflated the trach cuff and packed the inferior tracheostomy with surgicel hemostatic gauze. I then placed a 4x4 betadine gauze pressure dressing under the trach flange. The patient tolerated the procedure well with no complications.  A/P: If patient has major hemorrhage related to the tracheotomy should call general surgery to revise trach/obtain hemostasis in the OR since they performed the tracheotomy.   In the interim DO NOT remove betadine trach dressing and DO NOT deflate trach cuff or remove the surgicel packing until trach is changed/downsized ~ POD#7-10.

## 2012-05-25 NOTE — Progress Notes (Signed)
PULMONARY  / CRITICAL CARE MEDICINE  Name: Keith [name] MRN: [id number] DOB: [birth year]    ADMISSION DATE:  05/22/2012 CONSULTATION DATE:  05/22/2012  REFERRING MD :  Bernette Mayers PRIMARY SERVICE: PCCM  CHIEF COMPLAINT:  Angioedema  BRIEF PATIENT DESCRIPTION: 72 y/o male with acute onset stridor on 4/3 required emergent tracheostomy in the [facility] ED.  SIGNIFICANT EVENTS / STUDIES:  4/3 emergent tracheostomy in ED >> 4/3 CT neck/soft tissue with contrast >> no mass or abscess, widespread swelling   LINES / TUBES: 4/3 Tracheostomy (emergent) >>  CULTURES:  ANTIBIOTICS:   HISTORY OF PRESENT ILLNESS:  72 y/o male on lisinopril for years for HTN developed the sensation of "something in his throat" on 4/3.  This progressed rapidly over the course of three hours and he developed stridor and respiratory failure.  In the [facility] ED he was found to have laryngeal swelling, but no tongue swelling.  He never developed cardiac arrest and his hypoxemia was able to be corrected with bag mask ventilation.  An LMA was placed and an emergent tracheostomy was performed by Dr. Janee Morn of Trauma Surgery in the ED.   His fiance is with him and states that he has been feeling well lately.  She was with him nearly all day and reported no new medications, foods, insect stings or animal contact.  He did not have itching or a rash during the episode.   SUBJECTIVE:  Vent free 24x7 Poor air movement and no real phonation when cuff down 4/5 Still with oozing from trach although seems to be improving > he required suctioning of clots o/n,  BP up. Not dyspneic.  VITAL SIGNS: Temp:  [98 F (36.7 C)-100 F (37.8 C)] 98 F (36.7 C) (04/06 0714) Pulse Rate:  [80-104] 95 (04/06 0900) Resp:  [11-33] 22 (04/06 0900) BP: (138-172)/(77-99) 172/96 mmHg (04/06 0714) SpO2:  [94 %-100 %] 100 % (04/06 0900) FiO2 (%):  [28 %] 28 % (04/06 0900) Weight:  [92.8 kg (204 lb 9.4 oz)] 92.8 kg (204 lb 9.4 oz) (04/06  0500) HEMODYNAMICS:   VENTILATOR SETTINGS: Vent Mode:  [-]  FiO2 (%):  [28 %] 28 % INTAKE / OUTPUT: Intake/Output     04/05 0701 - 04/06 0700 04/06 0701 - 04/07 0700   I.V. (mL/kg) 557.5 (6) 75 (0.8)   IV Piggyback 50    Total Intake(mL/kg) 607.5 (6.5) 75 (0.8)   Urine (mL/kg/hr) 1600 (0.7) 700 (2.5)   Total Output 1600 700   Net -992.5 -625          PHYSICAL EXAMINATION: Gen: resting comfortably  HEENT: NCAT, PERRL, EOMi, Tracheostomy in place still w  Significant blood on gauze; no tongue swelling, PULM: Rhonchi bilaterally, decreased breath sound at bases L>>R  CV: RRR, no mgr, no JVD  AB: BS+, soft, nontender, no hsm Ext: warm, no edema, no clubbing, no cyanosis Derm: no rash or skin breakdown  Neuro: alert, asking questions  LABS:  Recent Labs Lab 05/22/12 1816 05/22/12 1817 05/22/12 1831 05/22/12 2245 05/23/12 0555 05/23/12 0635 05/24/12 0425 05/25/12 0603  HGB 11.4*  --   --   --  11.2*  --  10.2* 10.6*  WBC 9.7  --   --   --  10.2  --  9.8 5.5  PLT 130*  --   --   --  107*  --  105* 113*  NA 131*  --   --   --  135  --  137 135  K  3.4*  --   --   --  4.3  --  3.9 4.2  CL 96  --   --   --  100  --  102 100  CO2 21  --   --   --  20  --  27 28  GLUCOSE 117*  --   --   --  136*  --  120* 141*  BUN 24*  --   --   --  29*  --  24* 19  CREATININE 1.24  --   --   --  1.01  --  0.78 0.71  CALCIUM 8.6  --   --   --  8.9  --  9.0 9.1  AST 106*  --   --   --   --   --   --   --   ALT 34  --   --   --   --   --   --   --   ALKPHOS 94  --   --   --   --   --   --   --   BILITOT 0.9  --   --   --   --   --   --   --   PROT 8.5*  --   --   --   --   --   --   --   ALBUMIN 3.5  --   --   --   --   --   --   --   APTT 37  --   --   --   --  41*  --   --   INR 1.23  --   --   --   --  1.30  --   --   LATICACIDVEN  --   --  2.80*  --   --   --   --   --   TROPONINI  --  <0.30  --   --   --   --   --   --   PROBNP  --  22.4  --   --   --   --   --   --   PHART  --    --   --  7.323*  --   --   --   --   PCO2ART  --   --   --  45.4*  --   --   --   --   PO2ART  --   --   --  392.0*  --   --   --   --     Recent Labs Lab 05/23/12 0353 05/23/12 0828 05/23/12 1221 05/23/12 1615 05/23/12 1947  GLUCAP 113* 140* 125* 120* 110*    4/3 CXR:  pulm edema improved significantly overnight.  Possible left atelectasis  ASSESSMENT / PLAN:  PULMONARY A: Stridor, then VDRF, presumably due to lisinopril. CT showed diffuse pharyngeal and upper airway edema from an unknown process. no urticaria to suggest histamine mediated (ie allergic) angioedema. Negative pressure pulmonary edema, improved 4/3 CXR. Family has reported episodic angioedema of face over 2 months PTA. P:   -will ask ENT to help manage timing and location of decanulation (ie in OR), also to assess bleeding; suspect that there is nothing surgically to be done about the oozing at this time (short of ET intubation) as he would not tolerate trach removal for exploration. I've asked nurse  to make ENT aware of ongoing ooze. Possible topical hemostatic. -Trach collar 24x7 -Pulmonary hygiene -speech rx following for improvement phonation w cuff down and PMV -not ready to eat. Watch today on IVF  CARDIOVASCULAR A: HTN P:  -add Lisinopril to allergy list -resume home meds once he can tolerate PO -Will use IV labetalol initially.  RENAL A:  Mild AKI. Cr 1.24--> 1.01 resolved P:   -monitor UOP  GASTROINTESTINAL A:  High risk for esophageal/pharyngeal injury from repeated laryngoscopy attempt. CT neck showed diffuse pharyngeal and upper airway edema from an unknown process. P:   -Will get barium swallow before he is allowed to eat if recommended by speech rx  HEMATOLOGIC A: Hb slightly low at 11.2 likely due to bleeding from trach. Plt is130 --> 107 -> 105, likely due to alcoholic liver disease P:  -monitor CBC -dc'd Lovenox -SCD  INFECTIOUS A:  No acute issues. No leukocytosis.  Afebrile.   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  CD Maple Hudson, MD PCCM p [phone]  [phone]  or if no answer [phone]

## 2012-05-26 ENCOUNTER — Inpatient Hospital Stay (HOSPITAL_COMMUNITY): Payer: Medicare Other

## 2012-05-26 MED ORDER — CHLORDIAZEPOXIDE HCL 25 MG PO CAPS
50.0000 mg | ORAL_CAPSULE | Freq: Four times a day (QID) | ORAL | Status: AC
  Administered 2012-05-27 (×3): 50 mg via ORAL
  Filled 2012-05-26 (×3): qty 2

## 2012-05-26 MED ORDER — CHLORDIAZEPOXIDE HCL 25 MG PO CAPS
50.0000 mg | ORAL_CAPSULE | Freq: Four times a day (QID) | ORAL | Status: DC
Start: 2012-05-26 — End: 2012-05-26
  Administered 2012-05-26: 50 mg via ORAL
  Filled 2012-05-26: qty 2

## 2012-05-26 MED ORDER — CHLORDIAZEPOXIDE HCL 25 MG PO CAPS
25.0000 mg | ORAL_CAPSULE | Freq: Four times a day (QID) | ORAL | Status: DC
  Administered 2012-05-27 – 2012-05-29 (×7): 25 mg via ORAL
  Filled 2012-05-26 (×8): qty 1

## 2012-05-26 MED ORDER — CHLORDIAZEPOXIDE HCL 25 MG PO CAPS
25.0000 mg | ORAL_CAPSULE | Freq: Four times a day (QID) | ORAL | Status: DC
Start: 2012-05-27 — End: 2012-05-26

## 2012-05-26 MED ORDER — SODIUM CHLORIDE 0.9 % IJ SOLN
INTRAMUSCULAR | Status: AC
  Administered 2012-05-26: 3 mL
  Filled 2012-05-26: qty 10

## 2012-05-26 MED ORDER — PRO-STAT SUGAR FREE PO LIQD
30.0000 mL | Freq: Every day | ORAL | Status: DC
  Administered 2012-05-26 – 2012-05-28 (×3): 30 mL
  Filled 2012-05-26 (×5): qty 30

## 2012-05-26 MED ORDER — JEVITY 1.2 CAL PO LIQD
1000.0000 mL | ORAL | Status: DC
  Administered 2012-05-26 – 2012-05-30 (×4): 1000 mL
  Filled 2012-05-26 (×8): qty 1000

## 2012-05-26 NOTE — Progress Notes (Signed)
NUTRITION FOLLOW UP / CONSULT  Intervention:    Initiate TF via NG tube with Jevity 1.2 at 20 ml/h, increase by 10 ml every 4 hours to goal rate of 60 ml/h with Prostat 30 ml once daily to provide 1828 kcals, 95 gm protein, 1166 ml free water daily.  Nutrition Dx:   Inadequate oral intake related to inability to eat as evidenced by NPO status. Ongoing.  Goal:   Intake to meet >90% of estimated nutrition needs. Unmet.  Monitor:   TF tolerance/adequacy, labs, weight trend, diet advancement.  Assessment:   Patient remains NPO on trach collar. SLP following for PMSV. Not ready for PO diet yet. NG tube just placed by RN. RN is awaiting KUB for placement. Received MD Consult for TF initiation and management.  Height: Ht Readings from Last 1 Encounters:  05/22/12 5\' 6"  (1.676 m)    Weight Status:   Wt Readings from Last 1 Encounters:  05/26/12 202 lb 2.6 oz (91.7 kg)  05/23/12  204 lb 2.3 oz (92.6 kg)  Body mass index is 32.65 kg/(m^2).  Re-estimated needs:  Kcal: 1850-2050  Protein: 95-105 gm  Fluid: 1.9-2.1 L  Skin: no problems noted  Diet Order:  NPO   Intake/Output Summary (Last 24 hours) at 05/26/12 1231 Last data filed at 05/26/12 0826  Gross per 24 hour  Intake    675 ml  Output   1625 ml  Net   -950 ml    Last BM: PTA per documentation  Labs:   Recent Labs Lab 05/23/12 0555 05/24/12 0425 05/25/12 0603  NA 135 137 135  K 4.3 3.9 4.2  CL 100 102 100  CO2 20 27 28   BUN 29* 24* 19  CREATININE 1.01 0.78 0.71  CALCIUM 8.9 9.0 9.1  GLUCOSE 136* 120* 141*    CBG (last 3)   Recent Labs  05/23/12 1615 05/23/12 1947  GLUCAP 120* 110*    Scheduled Meds: . ampicillin (OMNIPEN) IV  2 g Intravenous Q8H  . antiseptic oral rinse  15 mL Mouth Rinse QID  . [START ON 05/27/2012] chlordiazePOXIDE  25 mg Oral Q6H  . chlordiazePOXIDE  50 mg Oral Q6H  . chlorhexidine  15 mL Mouth Rinse BID  . diphenhydrAMINE  25 mg Intravenous Q6H  . famotidine (PEPCID) IV   20 mg Intravenous Q12H  . pantoprazole (PROTONIX) IV  40 mg Intravenous QHS    Continuous Infusions: . banana bag IV 1000 mL ** MVI currently unavailable ** 75 mL/hr at 05/26/12 0600    Joaquin Courts, RD, LDN, CNSC Pager [phone] After Hours Pager [phone]

## 2012-05-26 NOTE — Progress Notes (Signed)
PULMONARY  / CRITICAL CARE MEDICINE  Name: Keith [name] MRN: [id number] DOB: 22-Nov-1940    ADMISSION DATE:  05/22/2012 CONSULTATION DATE:  05/22/2012  REFERRING MD :  Bernette Mayers PRIMARY SERVICE: PCCM  CHIEF COMPLAINT:  Angioedema BRIEF 72 y/o male on lisinopril for years for HTN developed the sensation of "something in his throat" on 4/3.  This progressed rapidly over the course of three hours and he developed stridor and respiratory failure.  In the [facility] ED he was found to have laryngeal swelling, but no tongue swelling.  He never developed cardiac arrest and his hypoxemia was able to be corrected with bag mask ventilation.  An LMA was placed and an emergent tracheostomy was performed by Dr. Janee Morn of Trauma Surgery in the ED.   His fiance is with him and states that he has been feeling well lately.  She was with him nearly all day and reported no new medications, foods, insect stings or animal contact.  He did not have itching or a rash during the episode.   CULTURES:  ANTIBIOTICS:   LINES / TUBES: 4/3 Tracheostomy (emergent) >>   SIGNIFICANT EVENTS / STUDIES:  4/3 emergent tracheostomy in ED >> 4/3 CT neck/soft tissue with contrast >> no mass or abscess, widespread swelling    05/25/12: Vent free 24x7, Poor air movement and no real phonation when cuff down 4/5 Still with oozing from trach although seems to be improving > he required suctioning of clots o/n,  BP up. Not dyspnea    SUBJECTIVE/OVERNIGHT/INTERVAL HX 05/26/2012: Had tracheostomy Pak yesterday by Dr. [name]. No bleeding today. He is still n.p.o. Daughter extremely concerned that he might go into alcohol withdrawal. Currently RASS sedation score is +1 and he is oriented   VITAL SIGNS: Temp:  [98.6 F (37 C)-99.2 F (37.3 C)] 98.6 F (37 C) (04/07 0823) Pulse Rate:  [75-114] 76 (04/07 0857) Resp:  [12-21] 12 (04/07 0516) BP: (139-170)/(48-105) 153/93 mmHg (04/07 0857) SpO2:  [97 %-100 %] 100 % (04/07  0857) FiO2 (%):  [28 %] 28 % (04/07 0857) Weight:  [91.7 kg (202 lb 2.6 oz)] 91.7 kg (202 lb 2.6 oz) (04/07 0500) HEMODYNAMICS:   VENTILATOR SETTINGS: Vent Mode:  [-]  FiO2 (%):  [28 %] 28 % INTAKE / OUTPUT: Intake/Output     04/06 0701 - 04/07 0700 04/07 0701 - 04/08 0700   I.V. (mL/kg) 1050 (11.5)    IV Piggyback     Total Intake(mL/kg) 1050 (11.5)    Urine (mL/kg/hr) 2200 (1) 125 (0.4)   Total Output 2200 125   Net -1150 -125          PHYSICAL EXAMINATION: Gen: resting comfortably  HEENT: NCAT, PERRL, EOMi, Tracheostomy in place still w  old blood on gause; no tongue swelling, PULM: Rhonchi bilaterally, decreased breath sound at bases L>>R  CV: RRR, no mgr, no JVD  AB: BS+, soft, nontender, no hsm Ext: warm, no edema, no clubbing, no cyanosis Derm: no rash or skin breakdown  Neuro: alert, asking questions  LABS:  Recent Labs Lab 05/22/12 1816 05/22/12 1817 05/22/12 1831 05/22/12 2245 05/23/12 0555 05/23/12 0635 05/24/12 0425 05/25/12 0603  HGB 11.4*  --   --   --  11.2*  --  10.2* 10.6*  WBC 9.7  --   --   --  10.2  --  9.8 5.5  PLT 130*  --   --   --  107*  --  105* 113*  NA 131*  --   --   --  135  --  137 135  K 3.4*  --   --   --  4.3  --  3.9 4.2  CL 96  --   --   --  100  --  102 100  CO2 21  --   --   --  20  --  27 28  GLUCOSE 117*  --   --   --  136*  --  120* 141*  BUN 24*  --   --   --  29*  --  24* 19  CREATININE 1.24  --   --   --  1.01  --  0.78 0.71  CALCIUM 8.6  --   --   --  8.9  --  9.0 9.1  AST 106*  --   --   --   --   --   --   --   ALT 34  --   --   --   --   --   --   --   ALKPHOS 94  --   --   --   --   --   --   --   BILITOT 0.9  --   --   --   --   --   --   --   PROT 8.5*  --   --   --   --   --   --   --   ALBUMIN 3.5  --   --   --   --   --   --   --   APTT 37  --   --   --   --  41*  --   --   INR 1.23  --   --   --   --  1.30  --   --   LATICACIDVEN  --   --  2.80*  --   --   --   --   --   TROPONINI  --  <0.30  --   --    --   --   --   --   PROBNP  --  22.4  --   --   --   --   --   --   PHART  --   --   --  7.323*  --   --   --   --   PCO2ART  --   --   --  45.4*  --   --   --   --   PO2ART  --   --   --  392.0*  --   --   --   --     Recent Labs Lab 05/23/12 0353 05/23/12 0828 05/23/12 1221 05/23/12 1615 05/23/12 1947  GLUCAP 113* 140* 125* 120* 110*    4/3 CXR:  pulm edema improved significantly overnight.  Possible left atelectasis  ASSESSMENT / PLAN:  PULMONARY A: Stridor, then VDRF, presumably due to lisinopril. CT showed diffuse pharyngeal and upper airway edema from an unknown process. no urticaria to suggest histamine mediated (ie allergic) angioedema. Negative pressure pulmonary edema, improved 4/3 CXR. Family has reported episodic angioedema of face over 2 months PTA.  05/26/2012: Tracheostomy site bleeding has stopped P:   -See Dr. Emeline Darling recommendations from progress note 05/26/2012 -Trach collar 24x7 -Pulmonary hygiene -speech rx following for improvement phonation w cuff down and PMV -not ready to eat. Watch today on IVF  CARDIOVASCULAR  A: HTN P:  -add Lisinopril to allergy list -resume home meds once he can tolerate PO -Will use IV labetalol initially.  RENAL A:  Mild AKI. Cr 1.24--> 1.01 resolved P:   -monitor UOP  GASTROINTESTINAL A:  High risk for esophageal/pharyngeal injury from repeated laryngoscopy attempt. CT neck showed diffuse pharyngeal and upper airway edema from an unknown process. 05/26/2012: Place  NG tube P:   -Will get barium swallow before he is allowed to eat if recommended by speech rx  HEMATOLOGIC A: Hb slightly low at 11.2 likely due to bleeding from trach. Plt is130 --> 107 -> 105, likely due to alcoholic liver disease P:  -monitor CBC -dc'd Lovenox -SCD  INFECTIOUS A:  No acute issues. No leukocytosis.  Afebrile.  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 Kalman Shan, M.D., [facility].C.P Pulmonary and Critical Care Medicine Staff Physician [facility] Pager: [phone], If no answer or between  15:00h - 7:00h: call [phone]  05/26/2012 11:01 AM

## 2012-05-26 NOTE — Progress Notes (Signed)
Subjective: POD#4 from emergent tracheotomy by Dr. Janee Morn from trauma surgery for severe angioedema. Had some tracheostomy site bleeding yesterday that I treated with surgicel hemostatic gauze packing and a betadine gauze pressure dressing. Has had some bloody trach secretions but bleeding from under trach flange has abated.  Objective: Vital signs in last 24 hours: Temp:  [98 F (36.7 C)-99.2 F (37.3 C)] 98.6 F (37 C) (04/07 0350) Pulse Rate:  [75-114] 75 (04/07 0516) Resp:  [12-22] 12 (04/07 0516) BP: (139-172)/(48-105) 139/89 mmHg (04/07 0350) SpO2:  [97 %-100 %] 99 % (04/07 0516) FiO2 (%):  [28 %] 28 % (04/07 0516) Weight:  [91.7 kg (202 lb 2.6 oz)] 91.7 kg (202 lb 2.6 oz) (04/07 0500)  #6 trach secure and patent with some bloody trach secretions that I suctioned off of betadine gauze pressure dressing. No bleeding noted from under trach flange. I tightly packed a large piece of gelfoam underneath the betadine gauze pressure dressing inferiorly to bolster the surgicel packing. Patient alert and oriented, mouths words.  @LABLAST2 (wbc:2,hgb:2,hct:2,plt:2)  Recent Labs  05/24/12 0425 05/25/12 0603  NA 137 135  K 3.9 4.2  CL 102 100  CO2 27 28  GLUCOSE 120* 141*  BUN 24* 19  CREATININE 0.78 0.71  CALCIUM 9.0 9.1    Medications:  Scheduled Meds: . ampicillin (OMNIPEN) IV  2 g Intravenous Q8H  . antiseptic oral rinse  15 mL Mouth Rinse QID  . chlorhexidine  15 mL Mouth Rinse BID  . diphenhydrAMINE  25 mg Intravenous Q6H  . famotidine (PEPCID) IV  20 mg Intravenous Q12H  . pantoprazole (PROTONIX) IV  40 mg Intravenous QHS   Continuous Infusions: . banana bag IV 1000 mL ** MVI currently unavailable ** 75 mL/hr at 05/26/12 0600   PRN Meds:.sodium chloride, fentaNYL, labetalol  Assessment/Plan: POD#4 from emergent trach by trauma surgery for angioedema. Continue decadron, benadryl, and famotidine for angioedema. If patient continues to improve and remains off vent will  likely scope to evaluate pharyngeal/laryngeal edema and consider changing trach to cuffless. Unless pharyngeal edema is markedly improved on repeat scope will likely have to go home with trach and home trach supplies/suction and come back to clinic for repeat laryngoscopy and decannulation once the edema is improved and laryngeal airway is visible. DO NOT change trach dressing or deflate trach cuff. If has persistent bleeding through trach can give racemic epi neb and consider transfusing platelets.   LOS: 4 days   Keith [name] 05/26/2012, 6:58 AM

## 2012-05-26 NOTE — Progress Notes (Signed)
Patient ID: Keith [name], male   DOB: 08-30-1940, 73 y.o.   MRN: [id number]   LOS: 4 days   Subjective: No c/o.   Objective: Vital signs in last 24 hours: Temp:  [98.6 F (37 C)-99.2 F (37.3 C)] 98.6 F (37 C) (04/07 0823) Pulse Rate:  [75-114] 75 (04/07 0516) Resp:  [12-22] 12 (04/07 0516) BP: (139-170)/(48-105) 139/89 mmHg (04/07 0350) SpO2:  [97 %-100 %] 99 % (04/07 0516) FiO2 (%):  [28 %] 28 % (04/07 0516) Weight:  [202 lb 2.6 oz (91.7 kg)] 202 lb 2.6 oz (91.7 kg) (04/07 0500) Last BM Date:  (Prior to admission)   Physical Exam General appearance: alert and no distress Neck: Trach in place, bloody gauze surrounding. Resp: clear to auscultation bilaterally Cardio: regular rate and rhythm   Assessment/Plan: S/p emergent trach -- Given involvement of Dr. Emeline Darling there seems to be no further role for surgery. Will sign off. Please call with questions.   Freeman Caldron, PA-C Pager: [phone] General Trauma PA Pager: [phone]   05/26/2012

## 2012-05-26 NOTE — Progress Notes (Signed)
SLP Cancellation Note  Patient Details Name: Keith [name] MRN: [id number] DOB: 24-Nov-1940   Cancelled treatment:       Following pt for PMSV.  Has had bleeding from trach site; per Dr. Emeline Darling, trach cuff should not be deflated until trach is changed/downsized.  Since PMSV can only be utilized with deflated cuff, will await direction from Dr. Emeline Darling to resume follow-up.  [name] L. Samson Frederic, [place] CCC/SLP Pager [phone]     Blenda Mounts Laurice 05/26/2012, 2:59 PM

## 2012-05-27 LAB — CBC
HCT: 27.9 % — ABNORMAL LOW (ref 39.0–52.0)
Hemoglobin: 9.5 g/dL — ABNORMAL LOW (ref 13.0–17.0)
MCH: 31.6 pg (ref 26.0–34.0)
MCHC: 34.1 g/dL (ref 30.0–36.0)
MCV: 92.7 fL (ref 78.0–100.0)
RBC: 3.01 MIL/uL — ABNORMAL LOW (ref 4.22–5.81)

## 2012-05-27 LAB — BASIC METABOLIC PANEL
BUN: 30 mg/dL — ABNORMAL HIGH (ref 6–23)
CO2: 28 mEq/L (ref 19–32)
Chloride: 105 mEq/L (ref 96–112)
Creatinine, Ser: 0.76 mg/dL (ref 0.50–1.35)
GFR calc Af Amer: >90 mL/min (ref >90)
Glucose, Bld: 101 mg/dL — ABNORMAL HIGH (ref 70–99)
Potassium: 3.6 mEq/L (ref 3.5–5.1)

## 2012-05-27 LAB — GLUCOSE, CAPILLARY
Glucose-Capillary: 92 mg/dL (ref 70–99)
Glucose-Capillary: 85 mg/dL (ref 70–99)

## 2012-05-27 MED ORDER — AMLODIPINE BESYLATE 10 MG PO TABS
10.0000 mg | ORAL_TABLET | Freq: Every day | ORAL | Status: DC
  Administered 2012-05-27 – 2012-05-30 (×4): 10 mg via ORAL
  Filled 2012-05-27 (×4): qty 1

## 2012-05-27 NOTE — Progress Notes (Signed)
SLP Cancellation Note  Patient Details Name: Keith [name] MRN: [id number] DOB: 02-Nov-1940   Cancelled treatment:       Reason Eval/Treat Not Completed: Patient not medically ready. SLP continues to follow pt for PMSV readiness. Await go ahead from ENT. Harlon Ditty, MA CCC-SLP [phone]    Claudine Mouton 05/27/2012, 2:03 PM

## 2012-05-27 NOTE — Evaluation (Signed)
Physical Therapy Evaluation Patient Details Name: Keith [name] MRN: [id number] DOB: [birth year] Today's Date: 05/27/2012 Time: 4098-1191 PT Time Calculation (min): 43 min  PT Assessment / Plan / Recommendation Clinical Impression  Patient is a 72 y/o male admitte due to laryngeal edema and s/p tracheostomy.  Presents with decreased independence with mobility due to generalized weakness, decreasd balance, decreased activity tolerance, decreasd safety awareness and limited knowledge of use of DME.  Will benefit from skilled PT in the acute setting to maximize independence and allow d/c home with HHPT and family assist (if able to stay with patient initially.)      PT Assessment  Patient needs continued PT services    Follow Up Recommendations  Home health PT;Supervision/Assistance - 24 hour       Barriers to Discharge  Question caregiver assist x 24 hours initially      Equipment Recommendations  Rolling walker with 5" wheels    Recommendations for Other Services   OT consult  Frequency Min 3X/week    Precautions / Restrictions Precautions Precautions: Fall Precaution Comments: trach collar, panda   Pertinent Vitals/Pain No pain complaints; SpO2 dropped to 86 % ambulating on room air, up to 91% resting on room air; replaced trach collar at 28% after ambulation      Mobility  Bed Mobility Bed Mobility: Sit to Supine Sit to Supine: 6: Modified independent (Device/Increase time) Transfers Transfers: Sit to Stand;Stand to Sit Sit to Stand: 4: Min assist;From toilet Stand to Sit: 4: Min assist;To chair/3-in-1 Details for Transfer Assistance: assist due to initially unsteady Ambulation/Gait Ambulation/Gait Assistance: 4: Min assist;1: +2 Total assist Ambulation/Gait: Patient Percentage: 80% Ambulation Distance (Feet): 200 Feet Assistive device: 2 person hand held assist;1 person hand held assist Ambulation/Gait Assistance Details: initially loss of balance and tech  assisted on other side for hand hold assist, able to decrease back to one hand hold over time.  Was at times scissoring with leftward loss of balance, but improved some by end of sesion. Gait Pattern: Step-through pattern;Scissoring;Wide base of support        PT Diagnosis: Abnormality of gait;Generalized weakness  PT Problem List: Decreased strength;Decreased activity tolerance;Decreased balance;Decreased mobility;Cardiopulmonary status limiting activity;Decreased knowledge of use of DME PT Treatment Interventions: DME instruction;Gait training;Functional mobility training;Patient/family education;Therapeutic activities;Therapeutic exercise   PT Goals Acute Rehab PT Goals PT Goal Formulation: With patient Time For Goal Achievement: 06/10/12 Potential to Achieve Goals: Good Pt will go Sit to Stand: with modified independence PT Goal: Sit to Stand - Progress: Goal set today Pt will go Stand to Sit: with modified independence PT Goal: Stand to Sit - Progress: Goal set today Pt will Stand: with modified independence PT Goal: Stand - Progress: Goal set today Pt will Ambulate: >150 feet;with modified independence;with least restrictive assistive device Pt will Perform Home Exercise Program: Independently PT Goal: Perform Home Exercise Program - Progress: Goal set today  Visit Information  Last PT Received On: 05/27/12 Assistance Needed: +1    Subjective Data  Subjective: Writing to communicate: I was walking the dog, mowing the grass, washing the car... Patient Stated Goal: To return to independent   Prior Functioning  Home Living Lives With: Alone Available Help at Discharge: Available PRN/intermittently;Family Type of Home: House Home Access: Stairs to enter [employer] of Steps: 1 Home Layout: One level Home Adaptive Equipment: None Prior Function Level of Independence: Independent Able to Take Stairs?: Yes Driving: Yes Vocation: Retired Comments: worked as  Cabin crew x 15 yrs, for [employer] x 30 yrs    Cognition  Cognition Overall Cognitive Status: Difficult to assess Difficult to assess due to: Impaired communication;Tracheostomy Arousal/Alertness: Awake/alert Orientation Level: Appears intact for tasks assessed Behavior During Session: [facility] for tasks performed Cognition - Other Comments: seemed little resistant to having help after d/c at first not naming anyone who could assist at d/c stating was independent, then later named 2 ex-wives, son, and daughter (here from [place],) who could help after d/c.  Also wanted to get someone to bring in clothes for walking,     Extremity/Trunk Assessment Right Lower Extremity Assessment RLE ROM/Strength/Tone: [facility] for tasks assessed Left Lower Extremity Assessment LLE ROM/Strength/Tone: [facility] for tasks assessed   Balance Balance Balance Assessed: Yes Static Standing Balance Static Standing - Balance Support: No upper extremity supported Static Standing - Level of Assistance: 4: Min assist Static Standing - Comment/# of Minutes: standing beside bed prior to walking, was leaning with legs braced against bed  End of Session PT - End of Session Equipment Utilized During Treatment: Gait belt Activity Tolerance: Patient limited by fatigue Patient left: in chair;with call bell/phone within reach  GP     [facility] 05/27/2012, 4:24 PM [place], PT [phone] 05/27/2012

## 2012-05-27 NOTE — Progress Notes (Signed)
Changed water bottle and set up 

## 2012-05-27 NOTE — Progress Notes (Signed)
Utilization Review Completed.   Kasey Ewings, RN, BSN Nurse Case Manager  [phone]  

## 2012-05-27 NOTE — Progress Notes (Signed)
PULMONARY  / CRITICAL CARE MEDICINE  Name: Keith [name] MRN: [id number] DOB: Oct 19, 1940    ADMISSION DATE:  05/22/2012 CONSULTATION DATE:  05/22/2012  REFERRING MD :  Bernette Mayers PRIMARY SERVICE: PCCM  CHIEF COMPLAINT:  Angioedema BRIEF 72 y/o male on lisinopril for years for HTN developed the sensation of "something in his throat" on 4/3.  This progressed rapidly over the course of three hours and he developed stridor and respiratory failure.  In the [facility] ED he was found to have laryngeal swelling, but no tongue swelling.  He never developed cardiac arrest and his hypoxemia was able to be corrected with bag mask ventilation.  An LMA was placed and an emergent tracheostomy was performed by Dr. Janee Morn of Trauma Surgery in the ED.   His fiance is with him and states that he has been feeling well lately.  She was with him nearly all day and reported no new medications, foods, insect stings or animal contact.  He did not have itching or a rash during the episode.   CULTURES:  ANTIBIOTICS:   LINES / TUBES: 4/3 Tracheostomy (emergent) >>   SIGNIFICANT EVENTS / STUDIES:  4/3 emergent tracheostomy in ED >> 4/3 CT neck/soft tissue with contrast >> no mass or abscess, widespread swelling    05/25/12: Vent free 24x7, Poor air movement and no real phonation when cuff down 4/5 Still with oozing from trach although seems to be improving > he required suctioning of clots o/n,  BP up. Not dyspnea    SUBJECTIVE/OVERNIGHT/INTERVAL HX 05/26/2012: Had tracheostomy Pak yesterday by Dr. [name]. No bleeding today. He is still n.p.o. Daughter extremely concerned that he might go into alcohol withdrawal. Currently RASS sedation score is +1 and he is oriented  05/27/2012: His nervousness and anxiety is much better after starting Librium taper yesterday. Tracheostomy site is now wheezing again but very slowly. Nursing plans to contact Dr. Emeline Darling to get his tracheostomy downsized. He is getting tube  feeds through NG tube. Blood pressure running 170 systolic. CIWA score consistently less than 8 and he is oriented   VITAL SIGNS: Temp:  [97.6 F (36.4 C)-98.8 F (37.1 C)] 97.8 F (36.6 C) (04/08 1152) Pulse Rate:  [57-93] 75 (04/08 0841) Resp:  [11-25] 11 (04/08 0841) BP: (115-164)/(57-94) 151/90 mmHg (04/08 0841) SpO2:  [93 %-100 %] 97 % (04/08 0824) FiO2 (%):  [28 %] 28 % (04/08 0824) Weight:  [90.7 kg (199 lb 15.3 oz)] 90.7 kg (199 lb 15.3 oz) (04/08 0500) HEMODYNAMICS:   VENTILATOR SETTINGS: Vent Mode:  [-]  FiO2 (%):  [28 %] 28 % INTAKE / OUTPUT: Intake/Output     04/07 0701 - 04/08 0700 04/08 0701 - 04/09 0700   I.V. (mL/kg) 1725 (19) 300 (3.3)   NG/GT 540 240   IV Piggyback 150 50   Total Intake(mL/kg) 2415 (26.6) 590 (6.5)   Urine (mL/kg/hr) 1475 (0.7) 700 (1.5)   Total Output 1475 700   Net +940 -110          PHYSICAL EXAMINATION: Gen: resting comfortably  HEENT: NCAT, PERRL, EOMi, Tracheostomy in place still w  old blood on gause; no tongue swelling, PULM: Clear to auscultation bilaterally CV: RRR, no mgr, no JVD  AB: BS+, soft, nontender, no hsm Ext: warm, no edema, no clubbing, no cyanosis Derm: no rash or skin breakdown  Neuro: alert, asking questions. CAM-ICU negative for delirium. RASS sedation score is +1  LABS:  Recent Labs Lab 05/22/12 1816 05/22/12 1817 05/22/12 1831  05/22/12 2245  05/23/12 0635 05/24/12 0425 05/25/12 0603 05/27/12 0451  HGB 11.4*  --   --   --   < >  --  10.2* 10.6* 9.5*  WBC 9.7  --   --   --   < >  --  9.8 5.5 8.8  PLT 130*  --   --   --   < >  --  105* 113* 144*  NA 131*  --   --   --   < >  --  137 135 141  K 3.4*  --   --   --   < >  --  3.9 4.2 3.6  CL 96  --   --   --   < >  --  102 100 105  CO2 21  --   --   --   < >  --  27 28 28   GLUCOSE 117*  --   --   --   < >  --  120* 141* 101*  BUN 24*  --   --   --   < >  --  24* 19 30*  CREATININE 1.24  --   --   --   < >  --  0.78 0.71 0.76  CALCIUM 8.6  --   --    --   < >  --  9.0 9.1 8.8  MG  --   --   --   --   --   --   --   --  2.4  PHOS  --   --   --   --   --   --   --   --  3.2  AST 106*  --   --   --   --   --   --   --   --   ALT 34  --   --   --   --   --   --   --   --   ALKPHOS 94  --   --   --   --   --   --   --   --   BILITOT 0.9  --   --   --   --   --   --   --   --   PROT 8.5*  --   --   --   --   --   --   --   --   ALBUMIN 3.5  --   --   --   --   --   --   --   --   APTT 37  --   --   --   --  41*  --   --   --   INR 1.23  --   --   --   --  1.30  --   --   --   LATICACIDVEN  --   --  2.80*  --   --   --   --   --   --   TROPONINI  --  <0.30  --   --   --   --   --   --   --   PROBNP  --  22.4  --   --   --   --   --   --   --   PHART  --   --   --  7.323*  --   --   --   --   --   PCO2ART  --   --   --  45.4*  --   --   --   --   --   PO2ART  --   --   --  392.0*  --   --   --   --   --   < > = values in this interval not displayed.  Recent Labs Lab 05/23/12 0828 05/23/12 1221 05/23/12 1615 05/23/12 1947 05/27/12 0731  GLUCAP 140* 125* 120* 110* 92    4/3 CXR:  pulm edema improved significantly overnight.  Possible left atelectasis  ASSESSMENT / PLAN:  PULMONARY A: Stridor, then VDRF, presumably due to lisinopril. CT showed diffuse pharyngeal and upper airway edema from an unknown process. no urticaria to suggest histamine mediated (ie allergic) angioedema. Negative pressure pulmonary edema, improved 4/3 CXR. Family has reported episodic angioedema of face over 2 months PTA.  05/26/2012: Tracheostomy site bleeding has stopped 05/27/2012: Tolerating tracheostomy collar well. Some loose of blood from tracheostomy site P:   -See Dr. Emeline Darling recommendations from progress note 05/26/2012 -Trach collar 24x7 - Downsize tracheostomy so that he can be and can have Passy-Muir valve; nurse will contact Dr. Emeline Darling -Pulmonary hygiene -speech rx following for improvement phonation w cuff down and PMV -not ready to eat. Watch  today on IVF  CARDIOVASCULAR A: HTN - Blood pressure 170 systolic on 05/27/2012  P:  -Should never get ACE inhibitors  - Start amlodipine 10 mg daily per NG tube -resume home meds once he can tolerate PO -Will use IV labetalol initially.  RENAL  Recent Labs Lab 05/22/12 1816 05/23/12 0555 05/24/12 0425 05/25/12 0603 05/27/12 0451  NA 131* 135 137 135 141  K 3.4* 4.3 3.9 4.2 3.6  CL 96 100 102 100 105  CO2 21 20 27 28 28   GLUCOSE 117* 136* 120* 141* 101*  BUN 24* 29* 24* 19 30*  CREATININE 1.24 1.01 0.78 0.71 0.76  CALCIUM 8.6 8.9 9.0 9.1 8.8  MG  --   --   --   --  2.4  PHOS  --   --   --   --  3.2    A:  Mild AKI. Cr 1.24--> 1.01 resolved P:   -monitor UOP - Discontinue the Foley catheter  GASTROINTESTINAL A:  High risk for esophageal/pharyngeal injury from repeated laryngoscopy attempt. CT neck showed diffuse pharyngeal and upper airway edema from an unknown process. 05/26/2012: Place  NG tube - 05/27/2012: On tube feeds P:   -Will get barium swallow before he is allowed to eat if recommended by speech rx  HEMATOLOGIC  Recent Labs Lab 05/24/12 0425 05/25/12 0603 05/27/12 0451  HGB 10.2* 10.6* 9.5*  HCT 29.0* 30.1* 27.9*  WBC 9.8 5.5 8.8  PLT 105* 113* 144*    Recent Labs Lab 05/22/12 1816 05/23/12 0635  INR 1.23 1.30    A: Hb slightly low at 11.2 likely due to bleeding from trach. 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 No leukocytosis.  Afebrile.  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 Started 72 hour Librium taper - 05/27/2012: Feels better  P:   -Monitor for withdrawal symptoms, with CIWA score - Start 3 days chlordiazepoxide prophylaxis on 05/26/2012    GLOBAL 05/26/2012 and 05/27/2012:: Daughter updated. Keep in step down unit    Dr. Kalman Shan, M.D., [facility].C.P Pulmonary and Critical Care Medicine Staff Physician [facility]  Pulmonary and Critical Care Pager: [phone], If no answer or between  15:00h - 7:00h: call [phone]  05/27/2012 12:15 PM

## 2012-05-28 ENCOUNTER — Encounter (HOSPITAL_COMMUNITY): Payer: Self-pay | Admitting: Internal Medicine

## 2012-05-28 LAB — GLUCOSE, CAPILLARY
Glucose-Capillary: 113 mg/dL — ABNORMAL HIGH (ref 70–99)
Glucose-Capillary: 137 mg/dL — ABNORMAL HIGH (ref 70–99)

## 2012-05-28 MED ORDER — SENNOSIDES-DOCUSATE SODIUM 8.6-50 MG PO TABS
1.0000 | ORAL_TABLET | Freq: Two times a day (BID) | ORAL | Status: DC
  Administered 2012-05-28 – 2012-05-30 (×5): 1 via ORAL
  Filled 2012-05-28 (×5): qty 1

## 2012-05-28 MED ORDER — VITAMIN B-1 100 MG PO TABS
100.0000 mg | ORAL_TABLET | Freq: Every day | ORAL | Status: DC
  Administered 2012-05-28 – 2012-05-29 (×2): 100 mg
  Filled 2012-05-28 (×3): qty 1

## 2012-05-28 MED ORDER — FOLIC ACID 1 MG PO TABS
1.0000 mg | ORAL_TABLET | Freq: Every day | ORAL | Status: DC
  Administered 2012-05-28 – 2012-05-29 (×2): 1 mg
  Filled 2012-05-28 (×3): qty 1

## 2012-05-28 MED ORDER — SODIUM CHLORIDE 0.9 % IV SOLN
INTRAVENOUS | Status: DC
  Administered 2012-05-28: 09:00:00 via INTRAVENOUS

## 2012-05-28 MED ORDER — ATORVASTATIN CALCIUM 40 MG PO TABS
40.0000 mg | ORAL_TABLET | Freq: Every day | ORAL | Status: DC
  Administered 2012-05-28 – 2012-05-29 (×2): 40 mg via ORAL
  Filled 2012-05-28 (×3): qty 1

## 2012-05-28 MED ORDER — PANTOPRAZOLE SODIUM 40 MG PO PACK
40.0000 mg | PACK | Freq: Every day | ORAL | Status: DC
  Administered 2012-05-28 – 2012-05-29 (×2): 40 mg
  Filled 2012-05-28 (×2): qty 20

## 2012-05-28 MED ORDER — FAMOTIDINE 20 MG PO TABS
20.0000 mg | ORAL_TABLET | Freq: Two times a day (BID) | ORAL | Status: DC
  Administered 2012-05-28 – 2012-05-29 (×3): 20 mg via ORAL
  Filled 2012-05-28 (×4): qty 1

## 2012-05-28 NOTE — Progress Notes (Signed)
Physical Therapy Treatment Patient Details Name: Keith [name] MRN: [id number] DOB: 1940-09-25 Today's Date: 05/28/2012 Time: 4782-9562 PT Time Calculation (min): 34 min  PT Assessment / Plan / Recommendation Comments on Treatment Session  Patient needing increased assist today.  Medicated per protocol, but more sleepy and affecting balance.  Will need 24 hour hands on assist available, daughter is eager to provide.  Will need daily mobilization to prevent deconditioning, walker left in room.    Follow Up Recommendations  Home health PT;Supervision/Assistance - 24 hour           Equipment Recommendations  Rolling walker with 5" wheels    Recommendations for Other Services  OT consult  Frequency Min 3X/week   Plan Discharge plan remains appropriate    Precautions / Restrictions Precautions Precautions: Fall Precaution Comments: trach collar, panda Restrictions Weight Bearing Restrictions: No   Pertinent Vitals/Pain No pain complaints    Mobility  Bed Mobility Bed Mobility: Sit to Supine;Supine to Sit;Sitting - Scoot to Edge of Bed Supine to Sit: 5: Supervision;HOB elevated;With rails Sitting - Scoot to Edge of Bed: 5: Supervision Sit to Supine: 5: Supervision Details for Bed Mobility Assistance: cues for movement to center of bed, to scoot to edge of bed Transfers Transfers: Stand Pivot Transfers Sit to Stand: 4: Min assist;From bed;From chair/3-in-1;With upper extremity assist Stand to Sit: 4: Min assist;To chair/3-in-1;To bed Stand Pivot Transfers: 3: Mod assist Details for Transfer Assistance: cue for safety with hand placement, assist for lifting, lowering safely; transferred chair back to bed after linens changed with mod assist without walker Ambulation/Gait Ambulation/Gait Assistance: 3: Mod assist Ambulation Distance (Feet): 200 Feet Assistive device: Rolling walker Ambulation/Gait Assistance Details: lateral loss of balance and posterior loss of balance  about 5 times with ambulation today.  Slight improvement later in session, but seemed more imbalanced today Gait Pattern: Step-through pattern;Narrow base of support;Shuffle;Decreased stride length      PT Goals Acute Rehab PT Goals Pt will go Sit to Stand: with modified independence PT Goal: Sit to Stand - Progress: Progressing toward goal Pt will go Stand to Sit: with modified independence PT Goal: Stand to Sit - Progress: Progressing toward goal Pt will Stand: with modified independence;with unilateral upper extremity support PT Goal: Stand - Progress: Progressing toward goal Pt will Ambulate: >150 feet;with modified independence;with least restrictive assistive device PT Goal: Ambulate - Progress: Progressing toward goal  Visit Information  Last PT Received On: 05/28/12    Subjective Data  Subjective: No complaints (per daughter "I will stay with him at home 24/7.)   Cognition  Cognition Overall Cognitive Status: Difficult to assess Difficult to assess due to: Impaired communication;Tracheostomy Arousal/Alertness: Lethargic Orientation Level: Appears intact for tasks assessed Behavior During Session: [facility] for tasks performed Cognition - Other Comments: sleeping upon my entry, daughter, son and another visitor in room, daughter states given Librium and has slept much of the day.    Balance  Static Standing Balance Static Standing - Balance Support: Bilateral upper extremity supported Static Standing - Level of Assistance: 4: Min assist  End of Session PT - End of Session Equipment Utilized During Treatment: Gait belt;Oxygen Activity Tolerance: Patient tolerated treatment well Patient left: in bed;with call bell/phone within reach;with family/visitor present   GP     [facility] 05/28/2012, 4:43 PM Sheran Lawless, PT [phone] 05/28/2012

## 2012-05-28 NOTE — Care Management Note (Signed)
    Page 1 of 2   05/30/2012     10:37:12 AM   CARE MANAGEMENT NOTE 05/30/2012  Patient:  Keith [name],Keith [name]   Account Number:  [id number]  Date Initiated:  05/23/2012  Documentation initiated by:  [name]  Subjective/Objective Assessment:   Throat swelling requring intubation.     Action/Plan:   PT eval pending   Anticipated DC Date:  05/30/2012   Anticipated DC Plan:  HOME W HOME HEALTH SERVICES      DC Planning Services  CM consult      [name] [place] Choice  HOME HEALTH   Choice offered to / List presented to:  C-4 Adult Children   DME arranged  WALKER - ROLLING      DME agency  [facility]     HH arranged  HH-1 RN  HH-2 PT  HH-3 OT  HH-4 NURSE'S AIDE  HH-6 SOCIAL WORKER      HH agency  [facility]   Status of service:  Completed, signed off Medicare Important Message given?   (If response is "NO", the following Medicare IM given date fields will be blank) Date Medicare IM given:   Date Additional Medicare IM given:    Discharge Disposition:  HOME W HOME HEALTH SERVICES  Per UR Regulation:  Reviewed for med. necessity/level of care/duration of stay  If discussed at Long Length of Stay Meetings, dates discussed:    Comments:  ContactJeneen Rinks   [phone]  05/30/12 10:36 Letha Cape RN, BSN [phone] patient is for dc today, notified Corrie Dandy with [facility].  Order in for rolling walker.  05/29/12 15:27 Letha Cape RN, BSN [phone] spoke with [name] Keith , patient's daughter, she chose [place] from agency list and patient agrees.  Refral made to Elizebeth Koller notified for [name], PT, OT, aide and CSW.  Soc will begin 24-48 hrs post discharge.  [name] Keith will be staying with patient, patient 's trach was downsized today to [name] 4. Patient is on tube feeding but is for swallow eval to see if he can come off of the tube feeds.  05/28/12- 1100- [name] RN BSN [phone] Spoke with pt's daughterKennith [name] at bedside- per conversation she is  from [place]- but plans are to stay [name] few weeks to be able to be here for her dad to assist him at discharge. Pt lived at home alone PTA and daughter states very independent. Daughter would like to take pt home with [facility] she would be there to assist initially when pt is discharged and would be available 24/7. Discussed options of HH vs ST-SNF for rehab and the differences. Daughter is not interested in ST-SNF at this time and desires to take her dad home with [name]. List of [name] agencies for Sister Keith [name] given to daughter. Also talked about private pay assistance as HH is not in the home for extended periods of time- daughter voices understanding of HH services and her need to learn about Trach care if her father is discharged with the Trach. - potential DME needs also discussed- ie- RW, trach setup, - NCM to cont. to follow for d/c needs- pt will need HH orders for home when ready for discharge- as plan is to return home with daughter to stay in town to assist.

## 2012-05-28 NOTE — Progress Notes (Signed)
Paged dr Ellyn Hack office re: if trach could be downsized as per family and md request.  New orders rec'd that dr Emeline Darling will downsize later this week. Will continue to monitor. Beryle Quant

## 2012-05-28 NOTE — Progress Notes (Signed)
PULMONARY  / CRITICAL CARE MEDICINE  Name: Keith [name] MRN: [id number] DOB: March 10, 1940    ADMISSION DATE:  05/22/2012 CONSULTATION DATE:  05/22/2012  REFERRING MD :  Bernette Mayers PRIMARY SERVICE: PCCM  CHIEF COMPLAINT:  Angioedema BRIEF 72 y/o male on lisinopril for years for HTN developed the sensation of "something in his throat" on 4/3.  This progressed rapidly over the course of three hours and he developed stridor and respiratory failure.  In the [facility] ED he was found to have laryngeal swelling, but no tongue swelling.  He never developed cardiac arrest and his hypoxemia was able to be corrected with bag mask ventilation.  An LMA was placed and an emergent tracheostomy was performed by Dr. Janee Morn of Trauma Surgery in the ED.   His fiance is with him and states that he has been feeling well lately.  She was with him nearly all day and reported no new medications, foods, insect stings or animal contact.  He did not have itching or a rash during the episode.   CULTURES:  ANTIBIOTICS:   LINES / TUBES: 4/3 Tracheostomy (emergent) >>   SIGNIFICANT EVENTS / STUDIES:  4/3 emergent tracheostomy in ED >> 4/3 CT neck/soft tissue with contrast >> no mass or abscess, widespread swelling    05/25/12: Vent free 24x7, Poor air movement and no real phonation when cuff down 4/5 Still with oozing from trach although seems to be improving > he required suctioning of clots o/n,  BP up. Not dyspnea  05/26/2012: Had tracheostomy Pak yesterday by Dr. [name]. No bleeding today. He is still n.p.o. Daughter extremely concerned that he might go into alcohol withdrawal. Currently RASS sedation score is +1 and he is oriented  05/27/2012: His nervousness and anxiety is much better after starting Librium taper yesterday. Tracheostomy site is now wheezing again but very slowly. Nursing plans to contact Dr. Emeline Darling to get his tracheostomy downsized. He is getting tube feeds through NG tube. Blood pressure  running 170 systolic. CIWA score consistently less than 8 and he is oriented    SUBJECTIVE/OVERNIGHT/INTERVAL HX 05/28/2012: No further bleeding from tracheostomy anxiety and blood pressure better.  According to the nurse spoke to Dr. Emeline Darling office and has been advised patient is not ready for tracheostomy downsizing and not to remove the dressing    VITAL SIGNS: Temp:  [97.8 F (36.6 C)-99.2 F (37.3 C)] 98 F (36.7 C) (04/09 0738) Pulse Rate:  [81-101] 81 (04/09 0900) Resp:  [13-24] 13 (04/09 0900) BP: (117-150)/(59-81) 137/67 mmHg (04/09 0900) SpO2:  [86 %-100 %] 98 % (04/09 0900) FiO2 (%):  [28 %] 28 % (04/09 0900) Weight:  [90.2 kg (198 lb 13.7 oz)] 90.2 kg (198 lb 13.7 oz) (04/09 0335) HEMODYNAMICS:   VENTILATOR SETTINGS: Vent Mode:  [-]  FiO2 (%):  [28 %] 28 % INTAKE / OUTPUT: Intake/Output     04/08 0701 - 04/09 0700 04/09 0701 - 04/10 0700   I.V. (mL/kg) 1725 (19.1) 225 (2.5)   NG/GT 1440 120   IV Piggyback 250    Total Intake(mL/kg) 3415 (37.9) 345 (3.8)   Urine (mL/kg/hr) 2100 (1) 250 (0.6)   Emesis/NG output 165 (0.1)    Total Output 2265 250   Net +1150 +95          PHYSICAL EXAMINATION: Gen: resting comfortably  HEENT: NCAT, PERRL, EOMi, Tracheostomy in place still w  old blood on gause; no tongue swelling, PULM: Clear to auscultation bilaterally CV: RRR, no mgr, no JVD  AB: BS+, soft, nontender, no hsm Ext: warm, no edema, no clubbing, no cyanosis Derm: no rash or skin breakdown  Neuro: alert, asking questions. CAM-ICU negative for delirium. RASS sedation score is +1  LABS:  Recent Labs Lab 05/22/12 1816 05/22/12 1817 05/22/12 1831 05/22/12 2245  05/23/12 0635 05/24/12 0425 05/25/12 0603 05/27/12 0451  HGB 11.4*  --   --   --   < >  --  10.2* 10.6* 9.5*  WBC 9.7  --   --   --   < >  --  9.8 5.5 8.8  PLT 130*  --   --   --   < >  --  105* 113* 144*  NA 131*  --   --   --   < >  --  137 135 141  K 3.4*  --   --   --   < >  --  3.9 4.2 3.6   CL 96  --   --   --   < >  --  102 100 105  CO2 21  --   --   --   < >  --  27 28 28   GLUCOSE 117*  --   --   --   < >  --  120* 141* 101*  BUN 24*  --   --   --   < >  --  24* 19 30*  CREATININE 1.24  --   --   --   < >  --  0.78 0.71 0.76  CALCIUM 8.6  --   --   --   < >  --  9.0 9.1 8.8  MG  --   --   --   --   --   --   --   --  2.4  PHOS  --   --   --   --   --   --   --   --  3.2  AST 106*  --   --   --   --   --   --   --   --   ALT 34  --   --   --   --   --   --   --   --   ALKPHOS 94  --   --   --   --   --   --   --   --   BILITOT 0.9  --   --   --   --   --   --   --   --   PROT 8.5*  --   --   --   --   --   --   --   --   ALBUMIN 3.5  --   --   --   --   --   --   --   --   APTT 37  --   --   --   --  41*  --   --   --   INR 1.23  --   --   --   --  1.30  --   --   --   LATICACIDVEN  --   --  2.80*  --   --   --   --   --   --   TROPONINI  --  <0.30  --   --   --   --   --   --   --  PROBNP  --  22.4  --   --   --   --   --   --   --   PHART  --   --   --  7.323*  --   --   --   --   --   PCO2ART  --   --   --  45.4*  --   --   --   --   --   PO2ART  --   --   --  392.0*  --   --   --   --   --   < > = values in this interval not displayed.  Recent Labs Lab 05/27/12 1605 05/27/12 2004 05/27/12 2330 05/28/12 0338 05/28/12 0737  GLUCAP 100* 99 113* 137* 117*    4/3 CXR:  pulm edema improved significantly overnight.  Possible left atelectasis  ASSESSMENT / PLAN:  PULMONARY A: Stridor, then VDRF, presumably due to lisinopril. CT showed diffuse pharyngeal and upper airway edema from an unknown process. no urticaria to suggest histamine mediated (ie allergic) angioedema. Negative pressure pulmonary edema, improved 4/3 CXR. Family has reported episodic angioedema of face over 2 months PTA.  05/26/2012: Tracheostomy site bleeding has stopped. DO NOT change trach dressing or deflate trach cuff per Dr Emeline Darling  05/27/2012: Tolerating tracheostomy collar well. Some  loose of blood from tracheostomy site  05/28/12: Tolerating tracheostomy collar collar. No bleeding. No plans to downsize tracheostomy  P:   - -Trach collar 24x7 - Downsize tracheostomy and dressing change only on advice of Dr. Emeline Darling who on 05/28/2012 has advised continued monitoring without dressing change or 14 don't cuff -Pulmonary hygiene -No dressing change,  No downsizing tracheostomy, no cuff deflation as of 05/28/2012  CARDIOVASCULAR A: HTN - Blood pressure 170 systolic on 05/27/2012  - 05/28/2012: Blood pressure improved P:  -Should never get ACE inhibitors  - t amlodipine 10 mg daily per NG tube -resume home meds once he can tolerate PO -Will use IV labetalol initially.  RENAL  Recent Labs Lab 05/22/12 1816 05/23/12 0555 05/24/12 0425 05/25/12 0603 05/27/12 0451  NA 131* 135 137 135 141  K 3.4* 4.3 3.9 4.2 3.6  CL 96 100 102 100 105  CO2 21 20 27 28 28   GLUCOSE 117* 136* 120* 141* 101*  BUN 24* 29* 24* 19 30*  CREATININE 1.24 1.01 0.78 0.71 0.76  CALCIUM 8.6 8.9 9.0 9.1 8.8  MG  --   --   --   --  2.4  PHOS  --   --   --   --  3.2    A:  Mild AKI. Cr 1.24--> 1.01 resolved P:   -monitor UOP - Discontinue the Foley catheter  GASTROINTESTINAL A:  High risk for esophageal/pharyngeal injury from repeated laryngoscopy attempt. CT neck showed diffuse pharyngeal and upper airway edema from an unknown process. 05/26/2012: Place  NG tube - 05/27/2012 and 05/28/2012  On tube feeds P:   - No speech evaluation until cleared by ENT about tracheostomy downsizing  -Will get barium swallow before he is allowed to eat if recommended by speech rx  HEMATOLOGIC  Recent Labs Lab 05/24/12 0425 05/25/12 0603 05/27/12 0451  HGB 10.2* 10.6* 9.5*  HCT 29.0* 30.1* 27.9*  WBC 9.8 5.5 8.8  PLT 105* 113* 144*    Recent Labs Lab 05/22/12 1816 05/23/12 0635  INR 1.23 1.30    A: Hb slightly low at 11.2 likely due to bleeding from  trach. 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 No leukocytosis.  Afebrile.  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 Started 72 hour Librium taper - 04/08/2014And 05/28/2012 : Feels better  P:   -Monitor for withdrawal symptoms, with CIWA score - Start 3 days chlordiazepoxide prophylaxis on 05/26/2012    GLOBAL 05/26/2012 and 05/27/2012:: Daughter updated. Keep in step down unit    05/28/2012: Move to regular medical bed. Daughter not at bedside   Dr. Kalman Shan, M.D., [facility].C.P Pulmonary and Critical Care Medicine Staff Physician [facility]monary and Critical Care Pager: [phone], If no answer or between  15:00h - 7:00h: call [phone]  05/28/2012 11:21 AM

## 2012-05-28 NOTE — Progress Notes (Signed)
Patient received from transferring unit at this time.  Patient is alert.  Mouthing words to family members.  Trach clean.  Respirations are nonlabored.  Skin warm and dry to touch.  Denies discomfort at this time.  Oriented to room and unit.  Call light within reach.    Kelli Hope M

## 2012-05-29 LAB — BASIC METABOLIC PANEL
BUN: 16 mg/dL (ref 6–23)
CO2: 31 mEq/L (ref 19–32)
Calcium: 8.4 mg/dL (ref 8.4–10.5)
Creatinine, Ser: 0.84 mg/dL (ref 0.50–1.35)
GFR calc non Af Amer: 87 mL/min — ABNORMAL LOW (ref >90)
Glucose, Bld: 134 mg/dL — ABNORMAL HIGH (ref 70–99)
Sodium: 137 mEq/L (ref 135–145)

## 2012-05-29 LAB — CBC
HCT: 27.9 % — ABNORMAL LOW (ref 39.0–52.0)
MCH: 32 pg (ref 26.0–34.0)
MCHC: 34.4 g/dL (ref 30.0–36.0)
MCV: 93 fL (ref 78.0–100.0)
RDW: 15.1 % (ref 11.5–15.5)

## 2012-05-29 MED ORDER — SODIUM CHLORIDE 0.9 % IV SOLN: INTRAVENOUS | Status: DC | PRN

## 2012-05-29 MED ORDER — DEXAMETHASONE SODIUM PHOSPHATE 10 MG/ML IJ SOLN
10.0000 mg | Freq: Three times a day (TID) | INTRAMUSCULAR | Status: DC
  Administered 2012-05-29 – 2012-05-30 (×4): 10 mg via INTRAVENOUS
  Filled 2012-05-29 (×7): qty 1

## 2012-05-29 MED ORDER — ACETAMINOPHEN 160 MG/5ML PO SOLN
650.0000 mg | Freq: Four times a day (QID) | ORAL | Status: DC | PRN
  Administered 2012-05-29 (×2): 650 mg
  Filled 2012-05-29 (×2): qty 20.3

## 2012-05-29 MED ORDER — CHLORDIAZEPOXIDE HCL 25 MG PO CAPS
25.0000 mg | ORAL_CAPSULE | Freq: Three times a day (TID) | ORAL | Status: DC
  Administered 2012-05-29 – 2012-05-30 (×2): 25 mg via ORAL
  Filled 2012-05-29 (×2): qty 1

## 2012-05-29 NOTE — Progress Notes (Signed)
eLink Physician-Brief Progress Note Patient Name: Keith [name] DOB: 1940-12-01 MRN: [id number]  Date of Service  05/29/2012   HPI/Events of Note   C/O of headache  eICU Interventions  Plan: PRN tylenol 650 mg q6 hours prn headache   Intervention Category Minor Interventions: Routine modifications to care plan (e.g. PRN medications for pain, fever)  Armoni Kludt 05/29/2012, 2:54 AM

## 2012-05-29 NOTE — Progress Notes (Signed)
PULMONARY  / CRITICAL CARE MEDICINE  Name: Keith [name] MRN: [id number] DOB: 02/20/1940    ADMISSION DATE:  05/22/2012 CONSULTATION DATE:  05/22/2012  REFERRING MD :  Bernette Mayers PRIMARY SERVICE: PCCM  CHIEF COMPLAINT:  Short of breath  BRIEF PATIENT DESCRIPTION: 72 yo male with stridor and laryngeal swelling with hypoxia 2nd to ACE inhibitor induced angioedema requiring emergent tracheostomy in ED.  ANTIBIOTICS: Ampicillin 4/05 >> 4/09  LINES / TUBES: 4/3 Tracheostomy (emergent) >>   SIGNIFICANT EVENTS:  4/3 emergent tracheostomy in ED >> 4/6 off vent 4/7 Librium added for prevention of alcohol withdrawal 4/10 Trach changed by ENT   STUDIES: 4/3 CT neck >> widespread swelling; no mass or abscess.  SUBJECTIVE: Feels good.  Wants to eat.  Denies chest pain, dyspnea, or abdominal pain.    VITAL SIGNS: Temp:  [97.7 F (36.5 C)-98.8 F (37.1 C)] 98.8 F (37.1 C) (04/10 0647) Pulse Rate:  [79-100] 89 (04/10 0647) Resp:  [18-20] 20 (04/10 0647) BP: (121-149)/(63-74) 138/74 mmHg (04/10 0647) SpO2:  [92 %-100 %] 98 % (04/10 0647) FiO2 (%):  [0.3 %-28 %] 28 % (04/10 0647) TC 28% INTAKE / OUTPUT: Intake/Output     04/09 0701 - 04/10 0700 04/10 0701 - 04/11 0700   I.V. (mL/kg) 436.7 (4.8) 210 (2.3)   NG/GT 930    IV Piggyback 50    Total Intake(mL/kg) 1416.7 (15.7) 210 (2.3)   Urine (mL/kg/hr) 1150 (0.5)    Emesis/NG output     Total Output 1150     Net +266.7 +210        Urine Occurrence 1 x      PHYSICAL EXAMINATION: Gen: No distress HEENT: Panda tube in nares, trach site clean PULM: no wheeze CV: regular  AB: soft, nontender Ext: no edema Derm: no rash   Neuro: alert, appropriate, normal strength  LABS:  Recent Labs Lab 05/22/12 1816 05/22/12 1817 05/22/12 1831 05/22/12 2245  05/23/12 0635  05/25/12 0603 05/27/12 0451 05/29/12 0530 05/29/12 0545  HGB 11.4*  --   --   --   < >  --   < > 10.6* 9.5* 9.6*  --   WBC 9.7  --   --   --   < >  --    < > 5.5 8.8 7.5  --   PLT 130*  --   --   --   < >  --   < > 113* 144* 130*  --   NA 131*  --   --   --   < >  --   < > 135 141  --  137  K 3.4*  --   --   --   < >  --   < > 4.2 3.6  --  3.3*  CL 96  --   --   --   < >  --   < > 100 105  --  100  CO2 21  --   --   --   < >  --   < > 28 28  --  31  GLUCOSE 117*  --   --   --   < >  --   < > 141* 101*  --  134*  BUN 24*  --   --   --   < >  --   < > 19 30*  --  16  CREATININE 1.24  --   --   --   < >  --   < >  0.71 0.76  --  0.84  CALCIUM 8.6  --   --   --   < >  --   < > 9.1 8.8  --  8.4  MG  --   --   --   --   --   --   --   --  2.4  --  1.9  PHOS  --   --   --   --   --   --   --   --  3.2  --  3.6  AST 106*  --   --   --   --   --   --   --   --   --   --   ALT 34  --   --   --   --   --   --   --   --   --   --   ALKPHOS 94  --   --   --   --   --   --   --   --   --   --   BILITOT 0.9  --   --   --   --   --   --   --   --   --   --   PROT 8.5*  --   --   --   --   --   --   --   --   --   --   ALBUMIN 3.5  --   --   --   --   --   --   --   --   --   --   APTT 37  --   --   --   --  41*  --   --   --   --   --   INR 1.23  --   --   --   --  1.30  --   --   --   --  1.37  LATICACIDVEN  --   --  2.80*  --   --   --   --   --   --   --   --   TROPONINI  --  <0.30  --   --   --   --   --   --   --   --   --   PROBNP  --  22.4  --   --   --   --   --   --   --   --   --   PHART  --   --   --  7.323*  --   --   --   --   --   --   --   PCO2ART  --   --   --  45.4*  --   --   --   --   --   --   --   PO2ART  --   --   --  392.0*  --   --   --   --   --   --   --   < > = values in this interval not displayed.  Recent Labs Lab 05/27/12 2004 05/27/12 2330 05/28/12 0338 05/28/12 0737 05/28/12 1131  GLUCAP 99 113* 137* 117* 123*   No results found.   ASSESSMENT / PLAN:  Acute respiratory failure 2nd to ACE induced  angioedema s/p tracheostomy. Plan: -Continue trach with plan for outpt ENT f/u -continue decadron per ENT -trach  care per ENT -likely will not need oxygen at home -continue speech valve -d/c pepcid  Hx of HTN, hyperlipidemia. Plan: -avoid ACE inhibitors -continue norvasc, lipitor  Hx of ETOH >> no evidence for withdrawal. Plan: -change librium to 25 mg q8h on 4/10 and wean off as tolerated -continue thiamine, folic acid   Dysphagia. Plan: -f/u with speech therapy -continue panda tube feeds until assessed by speech therapy -will d/c protonix >> SUP not indicated anymore  Anemia/thrombocytopenia of chronic disease >> stable. Plan: -f/u CBC as outpt -SCD for DVT prevention  Steroid induced hyperglycemia. Plan: -monitor CBG's on BMET  Updated family at bedside.  Likely will be ready for d/c home after swallowing evaluation.    Coralyn Helling, MD [facility] Pulmonary/Critical Care 05/29/2012, 2:13 PM Pager:  [phone] After 3pm call: [phone]

## 2012-05-29 NOTE — Progress Notes (Signed)
[facility]y-[facility] Speaking Valve - Treatment Patient Details  Name: Keith [name] MRN: [id number] Date of Birth: 10-04-1940  Today's Date: 05/29/2012 Time: 1050-1130 SLP Time Calculation (min): 40 min  Past Medical History:  Past Medical History  Diagnosis Date  . HTN (hypertension)   . Hypercholesterolemia    Past Surgical History:  Past Surgical History  Procedure Laterality Date  . Tracheostomy  05/22/12    emergent, angioedema    Assessment / Plan / Recommendation Clinical Impression  Pt. seen for skilled intervention with PMSV with daughter at bedside.  ENT changed trach to a # 4 cuffless this a.m.  SLP reviewed rationale, function and purpose of PMSV to pt. and daughter.  PMSV was donned for approximately 25 minutes without signs of respiratory distress or C02 retention.  Vocal quality slightly hoarse and 98% intelligible in conversation.  HR 86, Sp02 98% and RR within defined limits.  Pt. reported valve was "comfortable".  He required minimal verbal cues for coordination and respiration and phonation.  SLP taught daughter how to donn and doff valve with return demonstration.  SLP recommends pt. is safe to wear valve during all waking hours.  Recommend an MBS for tomorrow to assess swallow function ability to initiate po's.      Plan  Continue with current plan of care    Follow Up Recommendations   (to be determined)    Pertinent Vitals/Pain none    SLP Goals Potential to Achieve Goals: Good SLP Goal #1: Pt will tolerate PMSV for 3 minutes with all vital signs stable and phonation ability with min assist.   SLP Goal #1 - Progress: Progressing toward goal SLP Goal #2: Pt will demonstrate expectoration of secretions via oral cavity with PMSV in place with min assist.  SLP Goal #2 - Progress: Progressing toward goal   PMSV Trial  PMSV was placed for: 25 minutes Able to redirect subglottic air through upper airway: Yes Able to Attain Phonation: Yes Voice Quality:  Hoarse Able to Expectorate Secretions:  (not observed) Breath Support for Phonation: Adequate Intelligibility: Intelligible Respirations During Trial:  (WDL) SpO2 During Trial: 98 % Pulse During Trial: 86 Behavior: Alert;Expresses self well;Smiling;Responsive to questions;Good eye contact   Tracheostomy Tube       Vent Dependency       Cuff Deflation Trial  GO     Tolerated Cuff Deflation:  (N/A cuffless trach) Behavior: Alert;Cooperative;Expresses self well;Good eye contact;Responsive to questions   Royce Macadamia M.Ed [employer] [phone]  05/29/2012

## 2012-05-29 NOTE — Progress Notes (Signed)
Subjective: POD#7 from emergent tracheotomy by Trauma surgery for severe angioedema. Has been stable, trach bleeding resolved with surgicel packing.  Objective: Vital signs in last 24 hours: Temp:  [97.7 F (36.5 C)-99.1 F (37.3 C)] 98.4 F (36.9 C) (04/09 2300) Pulse Rate:  [79-100] 79 (04/10 0342) Resp:  [13-20] 19 (04/10 0342) BP: (119-149)/(63-67) 121/64 mmHg (04/09 2300) SpO2:  [91 %-100 %] 96 % (04/10 0342) FiO2 (%):  [0.3 %-28 %] 28 % (04/10 0342)  Awake, alert, neck with 6 cuffed trach with trach cuff up. Oral cavity clear. EOMI, PERRLA  Procedure note: 31502 tracheostomy tube change and 16109 flexible fiberoptic laryngoscopy: after verbal timeout and explaining the risks, benefits, and alternatives, the old 6 cuffed trach cuff was deflated and I removed the prolene sutures and betadine trach dressing. I then removed the velcro trach ties and removed the old 6 cuffed trach. I suctioned out the tracheotomy, and cauterized some skin excoriation with silver nitrate. I removed the old gelfoam and surgicel that I had placed for hemostasis. Good hemostasis was noted at the tracheostomy site. I then placed the new 4 cuffless shiley trach without difficulty and secured it with the velcro trach ties. Next, I performed flexible laryngoscopy and tracheoscopy by advancing the scope through the left nasal cavity. The left septum shows a nonobstructive septal spur but a normal middle meatus and middle and inferior turbinates. The posterior pharyngeal swelling and inspissated secretions are significantly improved from my prior exam. 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 The glottis shows improved but persistent false and true vocal fold edema. I am not able to clearly visualize the true vocal fold due to the edema, and the glottic airway is a narrow, ~0.5cm slit. The scope was then advanced through the new 4  cuffless trach, and this demonstrated good position above the carina, and the carina and tracheal mucosa are normal with no masses or lesions.  @LABLAST2 (wbc:2,hgb:2,hct:2,plt:2)  Recent Labs  05/27/12 0451  NA 141  K 3.6  CL 105  CO2 28  GLUCOSE 101*  BUN 30*  CREATININE 0.76  CALCIUM 8.8    Medications:  Scheduled Meds: . amLODipine  10 mg Oral Daily  . ampicillin (OMNIPEN) IV  2 g Intravenous Q8H  . antiseptic oral rinse  15 mL Mouth Rinse QID  . atorvastatin  40 mg Oral q1800  . chlordiazePOXIDE  25 mg Oral Q6H  . chlorhexidine  15 mL Mouth Rinse BID  . famotidine  20 mg Oral BID  . feeding supplement  30 mL Per Tube Q1500  . folic acid  1 mg Per Tube Daily  . pantoprazole sodium  40 mg Per Tube Daily  . senna-docusate  1 tablet Oral BID  . thiamine  100 mg Per Tube Daily   Continuous Infusions: . sodium chloride 10 mL/hr at 05/28/12 1120  . feeding supplement (JEVITY 1.2 CAL) 1,000 mL (05/29/12 0307)   PRN Meds:.acetaminophen (TYLENOL) oral liquid 160 mg/5 mL, fentaNYL  Assessment/Plan: Improved but persistent laryngeal edema s/p trach for angioedema. I downsized his trach and scoped him today. I don't think his glottic airway is improved enough to be safe for decannulation. I would recommend discharging him home with the 4 cuffless trach with a prednisone taper. Either his home ENT-Dr. Vira Blanco, or myself, can see him back in clinic in 2 weeks for repeat laryngoscopy. If his glottic edema is improved enough at that point he  could potentially be decannulated in the office. I will restart his IV Decadron for the glottic edema. Can start routine trach care now and set up home health for trach and set up home trach suction/supplies. Can have speech reevaluate him for a diet and remove the feeding tube if he passes his swallow eval.   LOS: 7 days   [name] 05/29/2012, 6:33 AM

## 2012-05-30 ENCOUNTER — Inpatient Hospital Stay (HOSPITAL_COMMUNITY): Payer: Medicare Other

## 2012-05-30 MED ORDER — CHLORDIAZEPOXIDE HCL 25 MG PO CAPS: ORAL_CAPSULE | ORAL | Status: DC

## 2012-05-30 MED ORDER — DEXAMETHASONE 0.75 MG PO TABS: 10.0000 mg | ORAL_TABLET | Freq: Three times a day (TID) | ORAL | Status: AC

## 2012-05-30 MED ORDER — VITAMIN B-1 100 MG PO TABS
100.0000 mg | ORAL_TABLET | Freq: Every day | ORAL | Status: DC
  Administered 2012-05-30: 100 mg via ORAL

## 2012-05-30 MED ORDER — FOLIC ACID 1 MG PO TABS: 1.0000 mg | ORAL_TABLET | Freq: Every day | ORAL | Status: DC

## 2012-05-30 MED ORDER — THIAMINE HCL 100 MG PO TABS: 100.0000 mg | ORAL_TABLET | Freq: Every day | ORAL | Status: AC

## 2012-05-30 MED ORDER — ACETAMINOPHEN 325 MG PO TABS
650.0000 mg | ORAL_TABLET | Freq: Four times a day (QID) | ORAL | Status: DC | PRN
Start: 2012-05-30 — End: 2012-05-30

## 2012-05-30 MED ORDER — AMLODIPINE BESYLATE 10 MG PO TABS: 10.0000 mg | ORAL_TABLET | Freq: Every day | ORAL | Status: DC

## 2012-05-30 MED ORDER — FOLIC ACID 1 MG PO TABS
1.0000 mg | ORAL_TABLET | Freq: Every day | ORAL | Status: DC
  Administered 2012-05-30: 1 mg via ORAL
  Filled 2012-05-30: qty 1

## 2012-05-30 NOTE — Discharge Summary (Signed)
Physician Discharge Summary  Patient ID: Keith [name] MRN: [id number] DOB/AGE: 1940-04-01 72 y.o.  Admit date: 05/22/2012 Discharge date: 05/30/2012  Problem List Principal Problem:   Laryngeal edema Active Problems:   Hypertension   Hyperlipidemia   Habitual alcohol use  HPI: 72 y/o male on lisinopril for years for HTN developed the sensation of "something in his throat" on 4/3. This progressed rapidly over the course of three hours and he developed stridor and respiratory failure. In the [facility] ED he was found to have laryngeal swelling, but no tongue swelling. He never developed cardiac arrest and his hypoxemia was able to be corrected with bag mask ventilation. An LMA was placed and an emergent tracheostomy was performed by Dr. Janee Morn of Trauma Surgery in the ED.  His fiance is with him and states that he has been feeling well lately. She was with him nearly all day and reported no new medications, foods, insect stings or animal contact. He did not have itching or a rash during the episode.  Hospital Course: yo male with stridor and laryngeal swelling with hypoxia 2nd to ACE inhibitor induced angioedema requiring emergent tracheostomy in ED.  ANTIBIOTICS:  Ampicillin 4/05 >> 4/09  LINES / TUBES:  4/3 Tracheostomy (emergent) >>  SIGNIFICANT EVENTS:  4/3 emergent tracheostomy in ED >>  4/6 off vent  4/7 Librium added for prevention of alcohol withdrawal  4/10 Trach changed by ENT  4/11 FEES >> D3 diet, thin liquids, chin tuck with swallow  STUDIES:  4/3 CT neck >> widespread swelling; no mass or abscess  ASSESSMENT / PLAN:  Acute respiratory failure 2nd to ACE induced angioedema s/p tracheostomy.  Plan:  -Continue trach with plan for outpt ENT f/u with Dr. Emeline Darling in 1 week  -continue decadron per ENT >> 10 mg q8h x 6 doses started on 4/10  -trach care per ENT  -Will not need oxygen at home  -continue speech valve  Hx of HTN, hyperlipidemia.  Plan:  -avoid ACE inhibitors   -continue norvasc, lipitor >> further assessment as outpt  Hx of ETOH >> no evidence for withdrawal.  Plan:  -gradually wean off librium >> 25 mg bid for 4/12 and 4/13, then 25 mg daily for 4/14 and 4/15, then d/c librium  -continue thiamine, folic acid  -further management as outpt with PCP  Dysphagia.  Plan:  -D3 diet, thin liquids, with chin tuck >> further assessment after trach removed  -d/c panda tube, tube feeds  Anemia/thrombocytopenia of chronic disease >> stable.  Plan:  -f/u CBC as outpt  Steroid induced hyperglycemia.  Plan:  -f/u blood sugar as outpt  Deconditioning.  Plan:  -PT recommending Home Health PT and rolling walker with 5" wheels  Updated family at bedside.  D/c home 4/11 >> will need f/u with PCP (Dr. Larina Bras) and ENT (Dr. Emeline Darling) with in one week.       Labs at discharge Lab Results  Component Value Date   CREATININE 0.84 05/29/2012   BUN 16 05/29/2012   NA 137 05/29/2012   K 3.3* 05/29/2012   CL 100 05/29/2012   CO2 31 05/29/2012   Lab Results  Component Value Date   WBC 7.5 05/29/2012   HGB 9.6* 05/29/2012   HCT 27.9* 05/29/2012   MCV 93.0 05/29/2012   PLT 130* 05/29/2012   Lab Results  Component Value Date   ALT 34 05/22/2012   AST 106* 05/22/2012   ALKPHOS 94 05/22/2012   BILITOT 0.9 05/22/2012   Lab  Results  Component Value Date   INR 1.37 05/29/2012   INR 1.30 05/23/2012   INR 1.23 05/22/2012    Current radiology studies Dg Swallowing Func-speech Pathology  05/30/2012  Breck Coons [place], CCC-SLP     05/30/2012 10:28 AM Objective Swallowing Evaluation: Modified Barium Swallowing Study   Patient Details  Name: Keith [name] MRN: [id number] Date of Birth: 06/02/1940  Today's Date: 05/30/2012 Time: 0865-7846 SLP Time Calculation (min): 20 min  Past Medical History:  Past Medical History  Diagnosis Date  . HTN (hypertension)   . Hypercholesterolemia    Past Surgical History:  Past Surgical History  Procedure Laterality Date  . Tracheostomy  05/22/12     emergent, angioedema   HPI:  72 yo male admitted to [facility] with acute onset of stridor over 3  hours on 05/22/12, ? allergic reaction to Lisinopril.  Pt required  emergent intubation and surgical trach placement after unable to  be placed via laryngoscope after multiple attempts due to  excessive bleeding/lack of view.  Per CT, pt with diffuse  pharyngeal and upper airway edema - unknown soure.  CXR 05/22/12  suggest pulmonary edema rather than infiltrates.       Assessment / Plan / Recommendation Clinical Impression  Dysphagia Diagnosis: Mild oral phase dysphagia;Mild pharyngeal  phase dysphagia Clinical impression: MBS completed with PMSV donned.  Pt.  exhibited mild oral dysphagia with mild delay in transit with  cracker.  Mild pharyngeal dysphagia is primarily motor based with  incomplete laryngeal closure resulting in laryngeal penetration  (silent) with cup sip thin barium.  A chin tuck posture improved  laryngeal protection during the swallow without entrance of  barium.  Min-mild (mod vallecular with cracker) vallecular and  pyriform sinus residue due to decreased tongue base retraction  and laryngeal elevation.  SLP recommends a Dys 3 diet texture and  thin liquids with chin tuck for liquids, pills whole in  applesauce, small sips, wear valve during po consumption.  Pt.  may be discharging soon.  Recommend follow up ST with home health  for further education regarding dysphagia and speaking valve.        Treatment Recommendation  Therapy as outlined in treatment plan below    Diet Recommendation Dysphagia 3 (Mechanical Soft);Thin liquid   Liquid Administration via: Cup;No straw Medication Administration: Whole meds with puree Supervision: Patient able to self feed;Full supervision/cueing  for compensatory strategies Compensations: Slow rate;Small sips/bites;Multiple dry swallows  after each bite/sip Postural Changes and/or Swallow Maneuvers: Seated upright 90  degrees    Other  Recommendations Oral Care  Recommendations: Oral care BID   Follow Up Recommendations  Home health SLP    Frequency and Duration min 2x/week  2 weeks   Pertinent Vitals/Pain none    SLP Swallow Goals Patient will utilize recommended strategies during swallow to  increase swallowing safety with: Modified independent assistance      Reason for Referral Objectively evaluate swallowing function   Oral Phase Oral Preparation/Oral Phase Oral Phase: Impaired Oral - Solids Oral - Puree: Delayed oral transit;Weak lingual manipulation   Pharyngeal Phase Pharyngeal Phase Pharyngeal Phase: Impaired Pharyngeal - Nectar Pharyngeal - Nectar Cup: Pharyngeal residue - pyriform  sinuses;Pharyngeal residue - posterior pharnyx;Reduced tongue  base retraction;Reduced laryngeal elevation Pharyngeal - Thin Pharyngeal - Thin Teaspoon: Pharyngeal residue -  valleculae;Pharyngeal residue - pyriform sinuses;Reduced tongue  base retraction;Reduced laryngeal elevation Pharyngeal - Thin Cup: Penetration/Aspiration during  swallow;Pharyngeal residue - pyriform sinuses;Pharyngeal residue  - posterior pharnyx;Reduced airway/laryngeal closure;Reduced  laryngeal elevation;Reduced epiglottic inversion Penetration/Aspiration details (thin cup): Material enters  airway, remains ABOVE vocal cords and not ejected out Pharyngeal - Thin Straw: Not tested Pharyngeal - Solids Pharyngeal - Regular: Pharyngeal residue - valleculae;Reduced  tongue base retraction  Cervical Esophageal Phase    GO    Cervical Esophageal Phase Cervical Esophageal Phase: Leonarda Salon         [place] Bussing.Ed CCC-SLP Pager [phone]  05/30/2012     Disposition:  Final discharge disposition not confirmed  Discharge Orders   Future Orders Complete By Expires     Discharge patient  As directed         Medication List    STOP taking these medications       atenolol 50 MG tablet  Commonly known as:  TENORMIN     lisinopril 20 MG tablet  Commonly known as:  PRINIVIL,ZESTRIL      TAKE these  medications       amLODipine 10 MG tablet  Commonly known as:  NORVASC  Take 1 tablet (10 mg total) by mouth daily.     atorvastatin 40 MG tablet  Commonly known as:  LIPITOR  Take 40 mg by mouth every morning.     chlordiazePOXIDE 25 MG capsule  Commonly known as:  LIBRIUM  1 tab 2 x a day , then 1 x a day till 4-15 then stop     dexamethasone 0.75 MG tablet  Commonly known as:  DECADRON  Take 13.5 tablets (10.125 mg total) by mouth every 8 (eight) hours.     folic acid 1 MG tablet  Commonly known as:  FOLVITE  Take 1 tablet (1 mg total) by mouth daily.     ipratropium 0.06 % nasal spray  Commonly known as:  ATROVENT  Place 2 sprays into the nose 4 (four) times daily.     mometasone 50 MCG/ACT nasal spray  Commonly known as:  NASONEX  Place 2 sprays into the nose daily.     thiamine 100 MG tablet  Take 1 tablet (100 mg total) by mouth daily.  Start taking on:  05/31/2012           Follow-up Information   Follow up with [name], MD On 06/09/2012. (2:00 pm. Any problems with tracheostomy please call Dr. Emeline Darling. Bring your insurance information to office visit.)    Contact information:   [address] [place] [address] [phone]       Follow up with Marlou Starks, MD On 06/05/2012. (10:30 am)    Contact information:   [address]  [address] [place] [address]        Discharged Condition: good  Time spent on discharge greater than 90 minutes.  Vital signs at Discharge. Temp:  [98.1 F (36.7 C)-98.7 F (37.1 C)] 98.3 F (36.8 C) (04/11 0546) Pulse Rate:  [78-102] 98 (04/11 0830) Resp:  [16-18] 16 (04/11 0830) BP: (137-157)/(68-79) 149/69 mmHg (04/11 1030) SpO2:  [95 %-100 %] 97 % (04/11 0855) FiO2 (%):  [28 %] 28 % (04/11 0830) Weight:  [92.4 kg (203 lb 11.3 oz)] 92.4 kg (203 lb 11.3 oz) (04/11 0700) Office follow up Special Information or instructions. No need for oxygen or humidification at  discharge. Please cal Dr Emeline Darling for any tracheostomy questions. [phone]. Signed: Brett Canales Minor ACNP Adolph Pollack PCCM Pager [phone] till 3 pm If no answer page [phone] 05/30/2012, 12:11 PM     Aliyah Abeyta  Craige Cotta, MD [facility] Pulmonary/Critical Care 05/30/2012, 12:18 PM Pager:  [phone] After 3pm call: [phone]

## 2012-05-30 NOTE — Progress Notes (Signed)
NUTRITION FOLLOW UP  Intervention:   1. Educated pt and daughter and gave handout on mechanical soft diet. 2. Gave contact information to reach diet office if pt or daughter had any questions.  Nutrition Dx:   Inadequate oral intake r/t tracheostomy and laryngeal swelling as AEB previous NPO status.   Goal:   Pt to meet >/= 90% of their estimated nutrition needs; not met.  Monitor:   Po intake, labs, tracheostomy status, weight  Assessment:   Pt was being discharged when I entered the room with instructions to follow a dysphagia 3 diet at home due to him going home with tracheostomy. Pt's daughter and pt had several questions about what he could eat with a mechanically soft diet. They requested a list of foods that were appropriate. Pt discussed with me his plans for what he was going to eat when he got home. He expressed that he understood and was able to teach back everything that he learned.   Height: Ht Readings from Last 1 Encounters:  05/22/12 5\' 6"  (1.676 m)    Weight Status:   Wt Readings from Last 1 Encounters:  05/30/12 203 lb 11.3 oz (92.4 kg)    Re-estimated needs:  Kcal: 2300-2400 Protein: 95-105 g Fluid: 2.3-2.4 L  Skin: WNL  Diet Order: Dysphagia   Intake/Output Summary (Last 24 hours) at 05/30/12 1438 Last data filed at 05/30/12 1300  Gross per 24 hour  Intake    250 ml  Output   1500 ml  Net  -1250 ml    Last BM: none recorded   Labs:   Recent Labs Lab 05/25/12 0603 05/27/12 0451 05/29/12 0545  NA 135 141 137  K 4.2 3.6 3.3*  CL 100 105 100  CO2 28 28 31   BUN 19 30* 16  CREATININE 0.71 0.76 0.84  CALCIUM 9.1 8.8 8.4  MG  --  2.4 1.9  PHOS  --  3.2 3.6  GLUCOSE 141* 101* 134*    CBG (last 3)   Recent Labs  05/28/12 0338 05/28/12 0737 05/28/12 1131  GLUCAP 137* 117* 123*    Scheduled Meds: . amLODipine  10 mg Oral Daily  . antiseptic oral rinse  15 mL Mouth Rinse QID  . atorvastatin  40 mg Oral q1800  . chlordiazePOXIDE   25 mg Oral Q8H  . chlorhexidine  15 mL Mouth Rinse BID  . dexamethasone  10 mg Intravenous Q8H  . folic acid  1 mg Oral Daily  . senna-docusate  1 tablet Oral BID  . [START ON 05/31/2012] thiamine  100 mg Oral Daily    Continuous Infusions:   Ebbie Latus RD, LDN

## 2012-05-30 NOTE — Progress Notes (Signed)
Physical Therapy Treatment Patient Details Name: JVION TURGEON MRN: [id number] DOB: [birth year] Today's Date: 05/30/2012 Time: 7829-5621 PT Time Calculation (min): 29 min  PT Assessment / Plan / Recommendation Comments on Treatment Session  Pt is improving in mobility and is anticipating discharge to home with HHPT.  Pt benefits from supplemental O2 to maintain sats > 90% and and needs continued work on balance and stability.  Pt/daughter instructed to estabilsh a schedule and do short bouts of activity throughout the day to incrase general strength.    Follow Up Recommendations  Home health PT;Supervision/Assistance - 24 hour     Does the patient have the potential to tolerate intense rehabilitation     Barriers to Discharge        Equipment Recommendations  Rolling walker with 5" wheels (pt will decide if 3in 1 is needed)    Recommendations for Other Services    Frequency Min 3X/week   Plan Discharge plan remains appropriate;Frequency remains appropriate    Precautions / Restrictions Precautions Precautions: Fall Precaution Comments: trach collar, panda   Pertinent Vitals/Pain No c/o pain.  Pt used 6L O2 at 28% trach collar attachment to maintain sats > 90% with activity    Mobility  Bed Mobility Bed Mobility: Sit to Supine;Supine to Sit;Sitting - Scoot to Edge of Bed Supine to Sit: 5: Supervision;HOB elevated;With rails Sitting - Scoot to Edge of Bed: 5: Supervision Sit to Supine: 5: Supervision Details for Bed Mobility Assistance: pt with some difficulty moving on soft hospitla bed Transfers Transfers: Sit to Stand;Stand to Sit Sit to Stand: From bed;From chair/3-in-1;5: Supervision Stand to Sit: To chair/3-in-1;To bed;5: Supervision Details for Transfer Assistance: cues to push up with arms from bed consisitently and reach back to control descent Ambulation/Gait Ambulation/Gait Assistance: 4: Min assist Assistive device: Rolling walker Ambulation/Gait  Assistance Details: assist for loss of balance x 2 pt needed repeated cues to extend trunk and acativate core for stability Gait Pattern: Step-through pattern;Narrow base of support;Shuffle;Decreased step length - right;Decreased step length - left;Trunk flexed Gait velocity: decreased General Gait Details: Pt chooses flexed posture with small steps.  He responded to cues to extend trunk, activate core and try to extend step length, but does demonstrate decreased endurance to maintain correction Stairs: No Wheelchair Mobility Wheelchair Mobility: No    Exercises General Exercises - Lower Extremity Ankle Circles/Pumps: AROM;Both;10 reps;Supine Quad Sets: AROM;10 reps;Supine Gluteal Sets: AROM;Both;10 reps;Standing Hip Flexion/Marching: AROM;Both;5 reps;Seated Other Exercises Other Exercises: bilateral upper extremity hip to hip for core activation Other Exercises: repeated sit to stand x 5 reps with trunk extension in standing   PT Diagnosis:    PT Problem List:   PT Treatment Interventions:     PT Goals Acute Rehab PT Goals Pt will go Sit to Stand: with modified independence PT Goal: Sit to Stand - Progress: Progressing toward goal Pt will go Stand to Sit: with modified independence PT Goal: Stand to Sit - Progress: Progressing toward goal Pt will Stand: with modified independence;with unilateral upper extremity support PT Goal: Stand - Progress: Progressing toward goal Pt will Ambulate: >150 feet;with modified independence;with least restrictive assistive device PT Goal: Ambulate - Progress: Progressing toward goal PT Goal: Perform Home Exercise Program - Progress: Progressing toward goal  Visit Information  Last PT Received On: 05/30/12 Assistance Needed: +1    Subjective Data  Subjective: pt with multiple questions Patient Stated Goal: to go home today   Cognition  Cognition Overall Cognitive Status: Appears within functional limits for  tasks  assessed/performed Arousal/Alertness: Awake/alert Orientation Level: Appears intact for tasks assessed Behavior During Session: [facility] for tasks performed    Balance  Balance Balance Assessed: Yes Static Sitting Balance Static Sitting - Level of Assistance: 7: Independent Static Sitting - Comment/# of Minutes: needs cues to extend trunk in sitting  Static Standing Balance Static Standing - Balance Support: Bilateral upper extremity supported;During functional activity Static Standing - Level of Assistance: 6: Modified independent (Device/Increase time)  End of Session PT - End of Session Equipment Utilized During Treatment: Gait belt;Oxygen (trach collar attachment) Activity Tolerance: Patient tolerated treatment well Patient left: in chair;with family/visitor present Nurse Communication: Mobility status   GP    Bayard Hugger. [place], [place] [phone] 05/30/2012, 12:01 PM

## 2012-05-30 NOTE — Progress Notes (Addendum)
[facility] Speaking Valve and Dysphagia Treatment Patient Details  Name: Keith [name] MRN: [id number] Date of Birth: 1940/08/08  Today's Date: 05/30/2012 Time: 1240-1307 SLP Time Calculation (min): 27 min  Past Medical History:  Past Medical History  Diagnosis Date  . HTN (hypertension)   . Hypercholesterolemia    Past Surgical History:  Past Surgical History  Procedure Laterality Date  . Tracheostomy  05/22/12    emergent, angioedema    Assessment / Plan / Recommendation Clinical Impression  SLP returned to pt's room for PMSV and dysphagia treatment.  Goal of session was instruct pt. to donn and doff valve independently.  Pt. standing at mirror with walker and steadying support from daughter.  He required total verbal/visual/tactile cues for correct hand placement and coordination as pt. could have easily pulled trach out.  Educated pt. and daughter for pt. NOT to attempt to donn/doff valve and have caregiver perform.  Daughter understands and is able to place/remove valve.  Treatment also included reiteration of chin tuck strategy which he needed moderate verbal reminders (signs hung on in pt's sight).       Plan  Other (Comment) (discharging today, goals not completely met)    Follow Up Recommendations  Home health SLP    Pertinent Vitals/Pain none    SLP Goals Potential to Achieve Goals: Good Potential Considerations:  (intermittent confusion) Progress/Goals/Alternative treatment plan discussed with pt/caregiver and they: Agree SLP Goal #1: Pt will tolerate PMSV for 3 minutes with all vital signs stable and phonation ability with min assist.   SLP Goal #1 - Progress: Met SLP Goal #2: Pt will demonstrate expectoration of secretions via oral cavity with PMSV in place with min assist.  SLP Goal #2 - Progress: Partly met   PMSV Trial  Able to redirect subglottic air through upper airway: Yes Able to Attain Phonation: Yes Voice Quality: Normal Breath Support for  Phonation: Adequate Intelligibility: Intelligible Respirations During Trial:  (WDL) SpO2 During Trial:  (WDL) Pulse During Trial:  (WDL) Behavior: Alert;Expresses self well;Smiling;Responsive to questions;Good eye contact   Tracheostomy Tube       Vent Dependency  FiO2 (%): 28 %    Cuff Deflation Trial  GO         Breck Coons [employer].Ed [employer] [phone]  05/30/2012

## 2012-05-30 NOTE — Progress Notes (Signed)
05/30/12 Patient being discharged today. Discharge instructions reviewed with pt and daughter.

## 2012-05-30 NOTE — Progress Notes (Signed)
05/30/12 NG tube removed from rt nare. Tube has been discontinued.

## 2012-05-30 NOTE — Procedures (Signed)
Objective Swallowing Evaluation: Modified Barium Swallowing Study  Patient Details  Name: Keith [name] MRN: [id number] Date of Birth: October 16, 1940  Today's Date: 05/30/2012 Time: 3086-5784 SLP Time Calculation (min): 20 min  Past Medical History:  Past Medical History  Diagnosis Date  . HTN (hypertension)   . Hypercholesterolemia    Past Surgical History:  Past Surgical History  Procedure Laterality Date  . Tracheostomy  05/22/12    emergent, angioedema   HPI:  72 yo male admitted to [facility] with acute onset of stridor over 3 hours on 05/22/12, ? allergic reaction to Lisinopril.  Pt required emergent intubation and surgical trach placement after unable to be placed via laryngoscope after multiple attempts due to excessive bleeding/lack of view.  Per CT, pt with diffuse pharyngeal and upper airway edema - unknown soure.  CXR 05/22/12 suggest pulmonary edema rather than infiltrates.       Assessment / Plan / Recommendation Clinical Impression  Dysphagia Diagnosis: Mild oral phase dysphagia;Mild pharyngeal phase dysphagia Clinical impression: MBS completed with PMSV donned.  Pt. exhibited mild oral dysphagia with mild delay in transit with cracker.  Mild pharyngeal dysphagia is primarily motor based with incomplete laryngeal closure resulting in laryngeal penetration (silent) with cup sip thin barium.  A chin tuck posture improved laryngeal protection during the swallow without entrance of barium.  Min-mild (mod vallecular with cracker) vallecular and pyriform sinus residue due to decreased tongue base retraction and laryngeal elevation.  SLP recommends a Dys 3 diet texture and thin liquids with chin tuck for liquids, pills whole in applesauce, small sips, wear valve during po consumption.  Pt. may be discharging soon.  Recommend follow up ST with home health for further education regarding dysphagia and speaking valve.        Treatment Recommendation  Therapy as outlined in treatment plan below     Diet Recommendation Dysphagia 3 (Mechanical Soft);Thin liquid   Liquid Administration via: Cup;No straw Medication Administration: Whole meds with puree Supervision: Patient able to self feed;Full supervision/cueing for compensatory strategies Compensations: Slow rate;Small sips/bites;Multiple dry swallows after each bite/sip Postural Changes and/or Swallow Maneuvers: Seated upright 90 degrees    Other  Recommendations Oral Care Recommendations: Oral care BID   Follow Up Recommendations  Home health SLP    Frequency and Duration min 2x/week  2 weeks   Pertinent Vitals/Pain none    SLP Swallow Goals Patient will utilize recommended strategies during swallow to increase swallowing safety with: Modified independent assistance      Reason for Referral Objectively evaluate swallowing function   Oral Phase Oral Preparation/Oral Phase Oral Phase: Impaired Oral - Solids Oral - Puree: Delayed oral transit;Weak lingual manipulation   Pharyngeal Phase Pharyngeal Phase Pharyngeal Phase: Impaired Pharyngeal - Nectar Pharyngeal - Nectar Cup: Pharyngeal residue - pyriform sinuses;Pharyngeal residue - posterior pharnyx;Reduced tongue base retraction;Reduced laryngeal elevation Pharyngeal - Thin Pharyngeal - Thin Teaspoon: Pharyngeal residue - valleculae;Pharyngeal residue - pyriform sinuses;Reduced tongue base retraction;Reduced laryngeal elevation Pharyngeal - Thin Cup: Penetration/Aspiration during swallow;Pharyngeal residue - pyriform sinuses;Pharyngeal residue - posterior pharnyx;Reduced airway/laryngeal closure;Reduced laryngeal elevation;Reduced epiglottic inversion Penetration/Aspiration details (thin cup): Material enters airway, remains ABOVE vocal cords and not ejected out Pharyngeal - Thin Straw: Not tested Pharyngeal - Solids Pharyngeal - Regular: Pharyngeal residue - valleculae;Reduced tongue base retraction  Cervical Esophageal Phase    GO    Cervical Esophageal  Phase Cervical Esophageal Phase: Leonarda Salon         Darrow Bussing.Ed [employer] [phone]  05/30/2012

## 2012-05-30 NOTE — Progress Notes (Addendum)
PULMONARY  / CRITICAL CARE MEDICINE  Name: Keith [name] MRN: [id number] DOB: 1940/04/06    ADMISSION DATE:  05/22/2012 CONSULTATION DATE:  05/22/2012  REFERRING MD :  Bernette Mayers PRIMARY SERVICE: PCCM  CHIEF COMPLAINT:  Short of breath  BRIEF PATIENT DESCRIPTION: 72 yo male with stridor and laryngeal swelling with hypoxia 2nd to ACE inhibitor induced angioedema requiring emergent tracheostomy in ED.  ANTIBIOTICS: Ampicillin 4/05 >> 4/09  LINES / TUBES: 4/3 Tracheostomy (emergent) >>   SIGNIFICANT EVENTS:  4/3 emergent tracheostomy in ED >> 4/6 off vent 4/7 Librium added for prevention of alcohol withdrawal 4/10 Trach changed by ENT  4/11 FEES >> D3 diet, thin liquids, chin tuck with swallow  STUDIES: 4/3 CT neck >> widespread swelling; no mass or abscess.  SUBJECTIVE: Feels good.  Wants to go home  VITAL SIGNS: Temp:  [98.1 F (36.7 C)-98.7 F (37.1 C)] 98.3 F (36.8 C) (04/11 0546) Pulse Rate:  [78-102] 98 (04/11 0830) Resp:  [16-18] 16 (04/11 0830) BP: (137-157)/(68-79) 140/73 mmHg (04/11 0546) SpO2:  [95 %-100 %] 97 % (04/11 0855) FiO2 (%):  [28 %] 28 % (04/11 0830) Weight:  [203 lb 11.3 oz (92.4 kg)] 203 lb 11.3 oz (92.4 kg) (04/11 0700) TC 28% INTAKE / OUTPUT: Intake/Output     04/10 0701 - 04/11 0700 04/11 0701 - 04/12 0700   I.V. (mL/kg) 400 (4.3)    NG/GT     IV Piggyback     Total Intake(mL/kg) 400 (4.3)    Urine (mL/kg/hr) 1500 (0.7)    Total Output 1500     Net -1100            PHYSICAL EXAMINATION: Gen: No distress HEENT: Panda tube in nares, trach site clean PULM: no wheeze CV: regular  AB: soft, nontender Ext: no edema Derm: no rash   Neuro: alert, appropriate, normal strength  LABS:  Recent Labs Lab 05/25/12 0603 05/27/12 0451 05/29/12 0530 05/29/12 0545  HGB 10.6* 9.5* 9.6*  --   WBC 5.5 8.8 7.5  --   PLT 113* 144* 130*  --   NA 135 141  --  137  K 4.2 3.6  --  3.3*  CL 100 105  --  100  CO2 28 28  --  31  GLUCOSE  141* 101*  --  134*  BUN 19 30*  --  16  CREATININE 0.71 0.76  --  0.84  CALCIUM 9.1 8.8  --  8.4  MG  --  2.4  --  1.9  PHOS  --  3.2  --  3.6  INR  --   --   --  1.37    Recent Labs Lab 05/27/12 2004 05/27/12 2330 05/28/12 0338 05/28/12 0737 05/28/12 1131  GLUCAP 99 113* 137* 117* 123*   No results found.   ASSESSMENT / PLAN:  Acute respiratory failure 2nd to ACE induced angioedema s/p tracheostomy. Plan: -Continue trach with plan for outpt ENT f/u with Dr. Emeline Darling in 1 week -continue decadron per ENT >> 10 mg q8h x 6 doses started on 4/10 -trach care per ENT  -Will not need oxygen at home -continue speech valve  Hx of HTN, hyperlipidemia. Plan: -avoid ACE inhibitors -continue norvasc, lipitor >> further assessment as outpt  Hx of ETOH >> no evidence for withdrawal. Plan: -gradually wean off librium >> 25 mg bid for 4/12 and 4/13, then 25 mg daily for 4/14 and 4/15, then d/c librium -continue thiamine, folic acid  -further management  as outpt with PCP  Dysphagia. Plan: -D3 diet, thin liquids, with chin tuck >> further assessment after trach removed -d/c panda tube, tube feeds  Anemia/thrombocytopenia of chronic disease >> stable. Plan: -f/u CBC as outpt  Steroid induced hyperglycemia. Plan: -f/u blood sugar as outpt  Deconditioning. Plan: -PT recommending Home Health PT and rolling walker with 5" wheels  Updated family at bedside.  D/c home 4/11 >> will need f/u with PCP (Dr. Larina Bras) and ENT (Dr. Emeline Darling) with in one week.  Coralyn Helling, MD [facility] Pulmonary/Critical Care 05/30/2012, 10:10 AM Pager:  [phone] After 3pm call: [phone]

## 2012-05-31 ENCOUNTER — Encounter (HOSPITAL_COMMUNITY): Payer: Self-pay | Admitting: Anesthesiology

## 2012-05-31 ENCOUNTER — Inpatient Hospital Stay (HOSPITAL_COMMUNITY): Payer: Medicare Other

## 2012-05-31 ENCOUNTER — Inpatient Hospital Stay (HOSPITAL_COMMUNITY): Payer: Medicare Other | Admitting: Anesthesiology

## 2012-05-31 ENCOUNTER — Emergency Department (HOSPITAL_COMMUNITY): Payer: Medicare Other

## 2012-05-31 ENCOUNTER — Encounter (HOSPITAL_COMMUNITY): Payer: Self-pay | Admitting: Emergency Medicine

## 2012-05-31 ENCOUNTER — Encounter (HOSPITAL_COMMUNITY)
Admission: EM | Disposition: A | Discharge status: Home Health | Payer: Self-pay | Source: Home / Self Care | Attending: Internal Medicine

## 2012-05-31 ENCOUNTER — Inpatient Hospital Stay (HOSPITAL_COMMUNITY)
Admission: EM | Admit: 2012-05-31 | Discharge: 2012-06-03 | DRG: 166 | Disposition: A | Discharge status: Home Health | Payer: Medicare Other | Attending: Internal Medicine | Admitting: Internal Medicine

## 2012-05-31 DIAGNOSIS — J9819 Other pulmonary collapse: Secondary | ICD-10-CM | POA: Diagnosis present

## 2012-05-31 DIAGNOSIS — F1010 Alcohol abuse, uncomplicated: Secondary | ICD-10-CM

## 2012-05-31 DIAGNOSIS — F1090 Alcohol use, unspecified, uncomplicated: Secondary | ICD-10-CM | POA: Diagnosis present

## 2012-05-31 DIAGNOSIS — E7800 Pure hypercholesterolemia, unspecified: Secondary | ICD-10-CM | POA: Diagnosis present

## 2012-05-31 DIAGNOSIS — J9500 Unspecified tracheostomy complication: Secondary | ICD-10-CM

## 2012-05-31 DIAGNOSIS — J384 Edema of larynx: Secondary | ICD-10-CM

## 2012-05-31 DIAGNOSIS — I10 Essential (primary) hypertension: Secondary | ICD-10-CM | POA: Diagnosis present

## 2012-05-31 DIAGNOSIS — D649 Anemia, unspecified: Secondary | ICD-10-CM | POA: Diagnosis present

## 2012-05-31 DIAGNOSIS — J9509 Other tracheostomy complication: Principal | ICD-10-CM | POA: Diagnosis present

## 2012-05-31 DIAGNOSIS — J9600 Acute respiratory failure, unspecified whether with hypoxia or hypercapnia: Secondary | ICD-10-CM

## 2012-05-31 DIAGNOSIS — R58 Hemorrhage, not elsewhere classified: Secondary | ICD-10-CM

## 2012-05-31 DIAGNOSIS — I4891 Unspecified atrial fibrillation: Secondary | ICD-10-CM

## 2012-05-31 DIAGNOSIS — R092 Respiratory arrest: Secondary | ICD-10-CM

## 2012-05-31 DIAGNOSIS — E785 Hyperlipidemia, unspecified: Secondary | ICD-10-CM | POA: Diagnosis present

## 2012-05-31 DIAGNOSIS — S2220XA Unspecified fracture of sternum, initial encounter for closed fracture: Secondary | ICD-10-CM

## 2012-05-31 DIAGNOSIS — S2249XA Multiple fractures of ribs, unspecified side, initial encounter for closed fracture: Secondary | ICD-10-CM

## 2012-05-31 DIAGNOSIS — I469 Cardiac arrest, cause unspecified: Secondary | ICD-10-CM | POA: Diagnosis present

## 2012-05-31 DIAGNOSIS — E876 Hypokalemia: Secondary | ICD-10-CM | POA: Diagnosis not present

## 2012-05-31 DIAGNOSIS — Z930 Tracheostomy status: Secondary | ICD-10-CM

## 2012-05-31 DIAGNOSIS — R000 Tachycardia, unspecified: Secondary | ICD-10-CM | POA: Diagnosis not present

## 2012-05-31 DIAGNOSIS — I9589 Other hypotension: Secondary | ICD-10-CM | POA: Diagnosis present

## 2012-05-31 DIAGNOSIS — Y849 Medical procedure, unspecified as the cause of abnormal reaction of the patient, or of later complication, without mention of misadventure at the time of the procedure: Secondary | ICD-10-CM | POA: Diagnosis present

## 2012-05-31 DIAGNOSIS — Z7289 Other problems related to lifestyle: Secondary | ICD-10-CM

## 2012-05-31 DIAGNOSIS — Y92009 Unspecified place in unspecified non-institutional (private) residence as the place of occurrence of the external cause: Secondary | ICD-10-CM

## 2012-05-31 DIAGNOSIS — T46905A Adverse effect of unspecified agents primarily affecting the cardiovascular system, initial encounter: Secondary | ICD-10-CM | POA: Diagnosis present

## 2012-05-31 DIAGNOSIS — J40 Bronchitis, not specified as acute or chronic: Secondary | ICD-10-CM | POA: Diagnosis not present

## 2012-05-31 HISTORY — PX: TRACHEOSTOMY TUBE PLACEMENT: SHX814

## 2012-05-31 HISTORY — PX: LARYNGOSCOPY: SHX5203

## 2012-05-31 LAB — COMPREHENSIVE METABOLIC PANEL
ALT: 49 U/L (ref 0–53)
Alkaline Phosphatase: 81 U/L (ref 39–117)
BUN: 18 mg/dL (ref 6–23)
CO2: 25 mEq/L (ref 19–32)
GFR calc Af Amer: >90 mL/min (ref >90)
GFR calc non Af Amer: >90 mL/min (ref >90)
Glucose, Bld: 183 mg/dL — ABNORMAL HIGH (ref 70–99)
Potassium: 3.2 mEq/L — ABNORMAL LOW (ref 3.5–5.1)
Sodium: 140 mEq/L (ref 135–145)
Total Bilirubin: 0.6 mg/dL (ref 0.3–1.2)
Total Protein: 7.8 g/dL (ref 6.0–8.3)

## 2012-05-31 LAB — CBC
HCT: 29.9 % — ABNORMAL LOW (ref 39.0–52.0)
HCT: 26.5 % — ABNORMAL LOW (ref 39.0–52.0)
Hemoglobin: 10.4 g/dL — ABNORMAL LOW (ref 13.0–17.0)
Hemoglobin: 9 g/dL — ABNORMAL LOW (ref 13.0–17.0)
MCHC: 34.8 g/dL (ref 30.0–36.0)
RBC: 3.24 MIL/uL — ABNORMAL LOW (ref 4.22–5.81)
RBC: 2.91 MIL/uL — ABNORMAL LOW (ref 4.22–5.81)
WBC: 11 10*3/uL — ABNORMAL HIGH (ref 4.0–10.5)

## 2012-05-31 LAB — POCT I-STAT 3, ART BLOOD GAS (G3+)
Bicarbonate: 26.4 mEq/L — ABNORMAL HIGH (ref 20.0–24.0)
O2 Saturation: 99 %
TCO2: 28 mmol/L (ref 0–100)
pCO2 arterial: 52.5 mmHg — ABNORMAL HIGH (ref 35.0–45.0)
pO2, Arterial: 147 mmHg — ABNORMAL HIGH (ref 80.0–100.0)

## 2012-05-31 LAB — POCT I-STAT, CHEM 8
BUN: 19 mg/dL (ref 6–23)
Calcium, Ion: 1.19 mmol/L (ref 1.13–1.30)
Chloride: 102 mEq/L (ref 96–112)
Creatinine, Ser: 0.7 mg/dL (ref 0.50–1.35)
Glucose, Bld: 186 mg/dL — ABNORMAL HIGH (ref 70–99)
HCT: 34 % — ABNORMAL LOW (ref 39.0–52.0)
Potassium: 3.2 mEq/L — ABNORMAL LOW (ref 3.5–5.1)

## 2012-05-31 LAB — PROTIME-INR: Prothrombin Time: 15.8 seconds — ABNORMAL HIGH (ref 11.6–15.2)

## 2012-05-31 LAB — TYPE AND SCREEN: Antibody Screen: NEGATIVE

## 2012-05-31 LAB — ABO/RH: ABO/RH(D): O POS

## 2012-05-31 LAB — GLUCOSE, CAPILLARY: Glucose-Capillary: 126 mg/dL — ABNORMAL HIGH (ref 70–99)

## 2012-05-31 SURGERY — CREATION, TRACHEOSTOMY
Anesthesia: General | Site: Neck | Wound class: Clean Contaminated

## 2012-05-31 MED ORDER — FENTANYL CITRATE 0.05 MG/ML IJ SOLN
50.0000 ug | INTRAMUSCULAR | Status: DC | PRN
Start: 2012-05-31 — End: 2012-06-02
  Administered 2012-05-31 (×2): 50 ug via INTRAVENOUS
  Administered 2012-05-31: 100 ug via INTRAVENOUS
  Administered 2012-05-31: 50 ug via INTRAVENOUS
  Administered 2012-06-01 (×2): 100 ug via INTRAVENOUS
  Administered 2012-06-01 (×3): 50 ug via INTRAVENOUS
  Administered 2012-06-02: 100 ug via INTRAVENOUS
  Administered 2012-06-02 (×2): 50 ug via INTRAVENOUS
  Administered 2012-06-02: 100 ug via INTRAVENOUS
  Filled 2012-05-31: qty 4
  Filled 2012-05-31 (×11): qty 2

## 2012-05-31 MED ORDER — ONDANSETRON HCL 4 MG/2ML IJ SOLN
INTRAMUSCULAR | Status: DC | PRN
  Administered 2012-05-31: 4 mg via INTRAVENOUS

## 2012-05-31 MED ORDER — SODIUM CHLORIDE 0.9 % IV SOLN
250.0000 mL | INTRAVENOUS | Status: DC | PRN
  Administered 2012-05-31: 07:00:00 via INTRAVENOUS

## 2012-05-31 MED ORDER — ATORVASTATIN CALCIUM 40 MG PO TABS
40.0000 mg | ORAL_TABLET | Freq: Every day | ORAL | Status: DC
  Administered 2012-06-01 – 2012-06-02 (×2): 40 mg via ORAL
  Filled 2012-05-31 (×5): qty 1

## 2012-05-31 MED ORDER — NEOSTIGMINE METHYLSULFATE 1 MG/ML IJ SOLN
INTRAMUSCULAR | Status: DC | PRN
  Administered 2012-05-31: 3 mg via INTRAVENOUS

## 2012-05-31 MED ORDER — PHENYLEPHRINE HCL 10 MG/ML IJ SOLN
INTRAMUSCULAR | Status: DC | PRN
  Administered 2012-05-31: 80 ug via INTRAVENOUS
  Administered 2012-05-31: 120 ug via INTRAVENOUS
  Administered 2012-05-31: 160 ug via INTRAVENOUS
  Administered 2012-05-31: 80 ug via INTRAVENOUS
  Administered 2012-05-31: 160 ug via INTRAVENOUS

## 2012-05-31 MED ORDER — MORPHINE SULFATE 2 MG/ML IJ SOLN
2.0000 mg | Freq: Once | INTRAMUSCULAR | Status: AC
  Administered 2012-05-31: 2 mg via INTRAVENOUS
  Filled 2012-05-31: qty 1

## 2012-05-31 MED ORDER — VITAMIN B-1 100 MG PO TABS
100.0000 mg | ORAL_TABLET | Freq: Every day | ORAL | Status: DC
  Administered 2012-06-02 – 2012-06-03 (×2): 100 mg via ORAL
  Filled 2012-05-31 (×3): qty 1

## 2012-05-31 MED ORDER — GLYCOPYRROLATE 0.2 MG/ML IJ SOLN
INTRAMUSCULAR | Status: DC | PRN
  Administered 2012-05-31: .4 mg via INTRAVENOUS

## 2012-05-31 MED ORDER — IPRATROPIUM BROMIDE 0.06 % NA SOLN
2.0000 | Freq: Four times a day (QID) | NASAL | Status: DC | PRN
  Administered 2012-06-01 – 2012-06-02 (×2): 2 via NASAL
  Filled 2012-05-31: qty 15

## 2012-05-31 MED ORDER — FAMOTIDINE IN NACL 20-0.9 MG/50ML-% IV SOLN
20.0000 mg | Freq: Two times a day (BID) | INTRAVENOUS | Status: DC
  Administered 2012-05-31 – 2012-06-03 (×6): 20 mg via INTRAVENOUS
  Filled 2012-05-31 (×9): qty 50

## 2012-05-31 MED ORDER — ROCURONIUM BROMIDE 100 MG/10ML IV SOLN
INTRAVENOUS | Status: DC | PRN
  Administered 2012-05-31: 30 mg via INTRAVENOUS

## 2012-05-31 MED ORDER — IOHEXOL 350 MG/ML SOLN
100.0000 mL | Freq: Once | INTRAVENOUS | Status: AC | PRN
  Administered 2012-05-31: 100 mL via INTRAVENOUS

## 2012-05-31 MED ORDER — DEXAMETHASONE 6 MG PO TABS: 10.0000 mg | ORAL_TABLET | Freq: Three times a day (TID) | ORAL | Status: DC

## 2012-05-31 MED ORDER — IPRATROPIUM BROMIDE 0.06 % NA SOLN
2.0000 | Freq: Four times a day (QID) | NASAL | Status: DC
  Filled 2012-05-31: qty 15

## 2012-05-31 MED ORDER — MIDAZOLAM HCL 2 MG/2ML IJ SOLN
2.0000 mg | INTRAMUSCULAR | Status: DC | PRN
  Administered 2012-05-31: 1 mg via INTRAVENOUS
  Filled 2012-05-31: qty 4

## 2012-05-31 MED ORDER — DEXAMETHASONE SODIUM PHOSPHATE 10 MG/ML IJ SOLN
10.0000 mg | Freq: Once | INTRAMUSCULAR | Status: AC
  Administered 2012-05-31: 10 mg via INTRAVENOUS
  Filled 2012-05-31: qty 1

## 2012-05-31 MED ORDER — MIDAZOLAM HCL 5 MG/5ML IJ SOLN
INTRAMUSCULAR | Status: DC | PRN
  Administered 2012-05-31 (×2): 1 mg via INTRAVENOUS

## 2012-05-31 MED ORDER — FENTANYL CITRATE 0.05 MG/ML IJ SOLN
INTRAMUSCULAR | Status: DC | PRN
  Administered 2012-05-31: 50 ug via INTRAVENOUS

## 2012-05-31 MED ORDER — KCL IN DEXTROSE-NACL 20-5-0.45 MEQ/L-%-% IV SOLN
INTRAVENOUS | Status: DC
  Administered 2012-05-31 – 2012-06-03 (×2): via INTRAVENOUS
  Filled 2012-05-31 (×7): qty 1000

## 2012-05-31 MED ORDER — BIOTENE DRY MOUTH MT LIQD
15.0000 mL | Freq: Four times a day (QID) | OROMUCOSAL | Status: DC
  Administered 2012-06-01 (×4): 15 mL via OROMUCOSAL

## 2012-05-31 MED ORDER — CHLORHEXIDINE GLUCONATE 0.12 % MT SOLN
15.0000 mL | Freq: Two times a day (BID) | OROMUCOSAL | Status: DC
  Administered 2012-05-31 – 2012-06-01 (×2): 15 mL via OROMUCOSAL
  Filled 2012-05-31 (×2): qty 15

## 2012-05-31 MED ORDER — FOLIC ACID 1 MG PO TABS
1.0000 mg | ORAL_TABLET | Freq: Every day | ORAL | Status: DC
  Administered 2012-06-02 – 2012-06-03 (×2): 1 mg via ORAL
  Filled 2012-05-31 (×3): qty 1

## 2012-05-31 MED ORDER — FLUTICASONE PROPIONATE 50 MCG/ACT NA SUSP
2.0000 | Freq: Every day | NASAL | Status: DC
  Administered 2012-06-01 – 2012-06-03 (×3): 2 via NASAL
  Filled 2012-05-31 (×2): qty 16

## 2012-05-31 MED FILL — Medication: Qty: 1 | Status: AC

## 2012-05-31 SURGICAL SUPPLY — 26 items
BLADE SURG ROTATE 9660 (MISCELLANEOUS) IMPLANT
CANISTER SUCTION 2500CC (MISCELLANEOUS) ×3 IMPLANT
CLEANER TIP ELECTROSURG 2X2 (MISCELLANEOUS) ×3 IMPLANT
CLOTH BEACON ORANGE TIMEOUT ST (SAFETY) ×3 IMPLANT
COVER SURGICAL LIGHT HANDLE (MISCELLANEOUS) ×3 IMPLANT
CRADLE DONUT ADULT HEAD (MISCELLANEOUS) IMPLANT
DECANTER SPIKE VIAL GLASS SM (MISCELLANEOUS) ×3 IMPLANT
ELECT COATED BLADE 2.86 ST (ELECTRODE) ×3 IMPLANT
ELECT REM PT RETURN 9FT ADLT (ELECTROSURGICAL) ×3
ELECTRODE REM PT RTRN 9FT ADLT (ELECTROSURGICAL) ×2 IMPLANT
GLOVE ECLIPSE 8.0 STRL XLNG CF (GLOVE) ×6 IMPLANT
GOWN PREVENTION PLUS XLARGE (GOWN DISPOSABLE) ×3 IMPLANT
GOWN STRL NON-REIN LRG LVL3 (GOWN DISPOSABLE) ×3 IMPLANT
KIT BASIN OR (CUSTOM PROCEDURE TRAY) ×3 IMPLANT
KIT ROOM TURNOVER OR (KITS) ×3 IMPLANT
NS IRRIG 1000ML POUR BTL (IV SOLUTION) ×3 IMPLANT
PAD ARMBOARD 7.5X6 YLW CONV (MISCELLANEOUS) ×6 IMPLANT
PENCIL BUTTON HOLSTER BLD 10FT (ELECTRODE) ×3 IMPLANT
SPONGE DRAIN TRACH 4X4 STRL 2S (GAUZE/BANDAGES/DRESSINGS) ×3 IMPLANT
SUT CHROMIC 2 0 SH (SUTURE) ×3 IMPLANT
SUT ETHILON 2 0 FS 18 (SUTURE) IMPLANT
SUT SILK 2 0 SH CR/8 (SUTURE) ×3 IMPLANT
TOWEL OR 17X24 6PK STRL BLUE (TOWEL DISPOSABLE) ×3 IMPLANT
TOWEL OR 17X26 10 PK STRL BLUE (TOWEL DISPOSABLE) ×3 IMPLANT
TRAY ENT MC OR (CUSTOM PROCEDURE TRAY) ×3 IMPLANT
WATER STERILE IRR 1000ML POUR (IV SOLUTION) ×3 IMPLANT

## 2012-05-31 NOTE — Progress Notes (Signed)
PULMONARY  / CRITICAL CARE MEDICINE  Name: Keith [name] MRN: [id number] DOB: 29-Nov-1940    ADMISSION DATE:  05/31/2012  REFERRING MD :  EDP  CHIEF COMPLAINT:  Bleeding  BRIEF PATIENT DESCRIPTION:  72 yo male with angioedema from ACE inhibitor s/p tracheostomy and d/c home 4/11.  Returned to ED 4/12 with tracheal bleeding, respiratory/cardiac arrest with 5 minutes CPR.    SIGNIFICANT EVENTS / STUDIES:  4/12 Respiratory/cardiac arrest from tracheal bleeding.  Taken to OR by ENT.  Rib/sternal fracture from CPR.  LINES / TUBES: Trach >>   SUBJECTIVE:  Returned from [place].  VITAL SIGNS: Temp:  [97.5 F (36.4 C)-98.5 F (36.9 C)] 97.5 F (36.4 C) (04/12 1015) Pulse Rate:  [61-141] 73 (04/12 1045) Resp:  [6-30] 6 (04/12 1045) BP: (89-152)/(54-96) 100/63 mmHg (04/12 1045) SpO2:  [96 %-100 %] 100 % (04/12 1045) FiO2 (%):  [28 %-40 %] 40 % (04/12 1015) Weight:  [194 lb 7.1 oz (88.2 kg)] 194 lb 7.1 oz (88.2 kg) (04/12 1015) HEMODYNAMICS:   VENTILATOR SETTINGS: Vent Mode:  [-] PRVC FiO2 (%):  [28 %-40 %] 40 % Set Rate:  [14 bmp] 14 bmp Vt Set:  [540 mL] 540 mL PEEP:  [5 cmH20] 5 cmH20 Plateau Pressure:  [18 cmH20-22 cmH20] 22 cmH20 INTAKE / OUTPUT: Intake/Output     04/11 0701 - 04/12 0700 04/12 0701 - 04/13 0700   I.V. (mL/kg)  800 (9.1)   Total Intake(mL/kg)  800 (9.1)   Net   +800        Urine Occurrence  1 x     PHYSICAL EXAMINATION: General:  No distress Neuro:  Sedated HEENT:  Trach in place Cardiovascular:  s1s2 regular Lungs:  Scattered rhonchi Abdomen: soft, non tender Musculoskeletal: no edema Skin:  No rashes  LABS:  Recent Labs Lab 05/25/12 0603 05/27/12 0451 05/29/12 0530 05/29/12 0545 05/31/12 0553 05/31/12 0605 05/31/12 0619  HGB 10.6* 9.5* 9.6*  --  10.4* 11.6*  --   WBC 5.5 8.8 7.5  --  18.3*  --   --   PLT 113* 144* 130*  --  228  --   --   NA 135 141  --  137 140 142  --   K 4.2 3.6  --  3.3* 3.2* 3.2*  --   CL 100 105  --  100  101 102  --   CO2 28 28  --  31 25  --   --   GLUCOSE 141* 101*  --  134* 183* 186*  --   BUN 19 30*  --  16 18 19   --   CREATININE 0.71 0.76  --  0.84 0.74 0.70  --   CALCIUM 9.1 8.8  --  8.4 9.0  --   --   MG  --  2.4  --  1.9  --   --   --   PHOS  --  3.2  --  3.6  --   --   --   AST  --   --   --   --  67*  --   --   ALT  --   --   --   --  49  --   --   ALKPHOS  --   --   --   --  81  --   --   BILITOT  --   --   --   --  0.6  --   --  PROT  --   --   --   --  7.8  --   --   ALBUMIN  --   --   --   --  3.2*  --   --   INR  --   --   --  1.37 1.29  --   --   LATICACIDVEN  --   --   --   --   --  5.67*  --   PHART  --   --   --   --   --   --  7.310*  PCO2ART  --   --   --   --   --   --  52.5*  PO2ART  --   --   --   --   --   --  147.0*    Recent Labs Lab 05/27/12 2330 05/28/12 0338 05/28/12 0737 05/28/12 1131 05/31/12 1012  GLUCAP 113* 137* 117* 123* 126*    Imaging: Ct Angio Chest Pe W/cm &/or Wo Cm  05/31/2012  *RADIOLOGY REPORT*  Clinical Data: Bleeding from tracheostomy.  CT ANGIOGRAPHY CHEST  Technique:  Multidetector CT imaging of the chest using the standard protocol during bolus administration of intravenous contrast. Multiplanar reconstructed images including MIPs were obtained and reviewed to evaluate the vascular anatomy.  Contrast: OMNIPAQUE IOHEXOL 350 MG/ML SOLN  Comparison: Chest radiograph 05/31/2012  Findings: The study has technical limitations due to excessive motion.  There is no evidence for a large or central pulmonary embolism.  No significant pericardial or pleural fluid.  There is perihepatic ascites.  Tracheostomy tube is present.  There is no significant chest lymphadenopathy.  The trachea and mainstem bronchi are patent.  There are patchy densities in both lungs that are most compatible with areas of atelectasis, particularly in the lower lobes.  There is no large area of parenchymal or airspace disease.  Degenerative changes in the thoracic  spine.  There is a mildly displaced fracture in the lower sternum.  The lower aspect of the sternum is posteriorly displaced.  There is no significant retrosternal fluid or blood. Fractures of the anterior right fourth, fifth and sixth ribs. There may also be a fracture of the right third rib. It is difficult to evaluate for fractures due to the excessive motion.  IMPRESSION: The examination is limited due to excessive patient motion.  There is no evidence to suggest a large or central pulmonary embolism.  Fracture of the sternum and right anterior rib fractures.  Findings are most likely related to recent cardiopulmonary resuscitation.  Bilateral atelectasis in the lungs.  Small amount of perihepatic ascites of unknown etiology.   Original Report Authenticated By: Richarda Overlie, M.D.    Dg Chest Portable 1 View  05/31/2012  *RADIOLOGY REPORT*  Clinical Data: Shortness of breath.  Tracheostomy.  PORTABLE CHEST - 1 VIEW  Comparison: 05/23/2012  Findings: Tracheostomy tube remains in place.  Low lung volumes are seen.  Multiple objects are seen overlying the chest, however no evidence of acute pulmonary consolidation seen.  No definite pleural effusion or pneumothorax visualized.  IMPRESSION: Low lung volumes and overlying objects limit evaluation.  No acute findings demonstrated.   Original Report Authenticated By: Myles Rosenthal, M.D.    Dg Swallowing Func-speech Pathology  05/30/2012  Breck Coons [place], CCC-SLP     05/30/2012 10:28 AM Objective Swallowing Evaluation: Modified Barium Swallowing Study   Patient Details  Name: Keith [name] MRN: [id number] Date of Birth: 1940/12/17  Today's Date: 05/30/2012 Time: 4098-1191 SLP Time Calculation (min): 20 min  Past Medical History:  Past Medical History  Diagnosis Date  . HTN (hypertension)   . Hypercholesterolemia    Past Surgical History:  Past Surgical History  Procedure Laterality Date  . Tracheostomy  05/22/12    emergent, angioedema   HPI:  72 yo male admitted to  [facility] with acute onset of stridor over 3  hours on 05/22/12, ? allergic reaction to Lisinopril.  Pt required  emergent intubation and surgical trach placement after unable to  be placed via laryngoscope after multiple attempts due to  excessive bleeding/lack of view.  Per CT, pt with diffuse  pharyngeal and upper airway edema - unknown soure.  CXR 05/22/12  suggest pulmonary edema rather than infiltrates.       Assessment / Plan / Recommendation Clinical Impression  Dysphagia Diagnosis: Mild oral phase dysphagia;Mild pharyngeal  phase dysphagia Clinical impression: MBS completed with PMSV donned.  Pt.  exhibited mild oral dysphagia with mild delay in transit with  cracker.  Mild pharyngeal dysphagia is primarily motor based with  incomplete laryngeal closure resulting in laryngeal penetration  (silent) with cup sip thin barium.  A chin tuck posture improved  laryngeal protection during the swallow without entrance of  barium.  Min-mild (mod vallecular with cracker) vallecular and  pyriform sinus residue due to decreased tongue base retraction  and laryngeal elevation.  SLP recommends a Dys 3 diet texture and  thin liquids with chin tuck for liquids, pills whole in  applesauce, small sips, wear valve during po consumption.  Pt.  may be discharging soon.  Recommend follow up ST with home health  for further education regarding dysphagia and speaking valve.        Treatment Recommendation  Therapy as outlined in treatment plan below    Diet Recommendation Dysphagia 3 (Mechanical Soft);Thin liquid   Liquid Administration via: Cup;No straw Medication Administration: Whole meds with puree Supervision: Patient able to self feed;Full supervision/cueing  for compensatory strategies Compensations: Slow rate;Small sips/bites;Multiple dry swallows  after each bite/sip Postural Changes and/or Swallow Maneuvers: Seated upright 90  degrees    Other  Recommendations Oral Care Recommendations: Oral care BID   Follow Up Recommendations   Home health SLP    Frequency and Duration min 2x/week  2 weeks   Pertinent Vitals/Pain none    SLP Swallow Goals Patient will utilize recommended strategies during swallow to  increase swallowing safety with: Modified independent assistance      Reason for Referral Objectively evaluate swallowing function   Oral Phase Oral Preparation/Oral Phase Oral Phase: Impaired Oral - Solids Oral - Puree: Delayed oral transit;Weak lingual manipulation   Pharyngeal Phase Pharyngeal Phase Pharyngeal Phase: Impaired Pharyngeal - Nectar Pharyngeal - Nectar Cup: Pharyngeal residue - pyriform  sinuses;Pharyngeal residue - posterior pharnyx;Reduced tongue  base retraction;Reduced laryngeal elevation Pharyngeal - Thin Pharyngeal - Thin Teaspoon: Pharyngeal residue -  valleculae;Pharyngeal residue - pyriform sinuses;Reduced tongue  base retraction;Reduced laryngeal elevation Pharyngeal - Thin Cup: Penetration/Aspiration during  swallow;Pharyngeal residue - pyriform sinuses;Pharyngeal residue  - posterior pharnyx;Reduced airway/laryngeal closure;Reduced  laryngeal elevation;Reduced epiglottic inversion Penetration/Aspiration details (thin cup): Material enters  airway, remains ABOVE vocal cords and not ejected out Pharyngeal - Thin Straw: Not tested Pharyngeal - Solids Pharyngeal - Regular: Pharyngeal residue - valleculae;Reduced  tongue base retraction  Cervical Esophageal Phase    GO    Cervical Esophageal Phase Cervical Esophageal Phase: [facility]  Breck Coons [place].Ed CCC-SLP Pager [phone]  05/30/2012      ASSESSMENT / PLAN:  PULMONARY A: Angioedema 2nd to ACE inhibitor s/p trach. Tracheal bleeding 4/12. Acute respiratory failure. Rib/sternal fracture after CPR. P:   Wean to trach collar as tolerated F/u CXR Decadron per ENT  CARDIOVASCULAR A: Hx of HTN, hyperlipidemia. P:  Resume amlodipine, lipitor when able to swallow  RENAL A:  Hypokalemia. P:   F/u BMET  GASTROINTESTINAL A:  Nutrition. P:    Advance diet once of vent  HEMATOLOGIC A:  Tracheal bleeding. P:  SCD for DVT prevention F/u CBC  INFECTIOUS A:  No evidence for infection. P:   Monitor clinically  ENDOCRINE A:  No hx of DM.   P:   Monitor CBG on BMET  NEUROLOGIC A:  Pain control. Hx of ETOH. P:   PRN versed, fentanyl  CC time 45 minutes.  Coralyn Helling, MD [facility] Pulmonary/Critical Care 05/31/2012, 11:14 AM Pager:  [phone] After 3pm call: [phone]
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
## 2012-05-31 NOTE — Consult Note (Signed)
Jamarkis, Keith [name] 72 y.o., male [id number]     Chief Complaint: trach occlusion, hemorrhage  HPI: 72 yo bm discharged from hospital yesterday after admission for ACE inhibitor angioedema.  Required emergency tracheostomy in ER.  Had persistent swelling and was discharged with trach in place.  Has been oozing for several days.  No blood thinners.  Today, in ER, occluded with blood clots and had resp arrest necessitating CPR. No cardiac arrest.  O2 sats reported in 30's.  Tube successfully changed, and sats recovered.  Add'l bleeding from the tube, and from around the tube.  Almost occluded a second time.  ENT called for assistance.  PMH: Past Medical History  Diagnosis Date  . HTN (hypertension)   . Hypercholesterolemia     Surg Hx: Past Surgical History  Procedure Laterality Date  . Tracheostomy  05/22/12    emergent, angioedema    FHx:  No family history on file. SocHx:  reports that he has never smoked. He does not have any smokeless tobacco history on file. He reports that he drinks about 7.0 ounces of alcohol per week. He reports that he does not use illicit drugs.  ALLERGIES:  Allergies  Allergen Reactions  . Lisinopril Other (See Comments)    Presented with laryngeal edema - likely caused is lisinopril      (Not in a hospital admission)  Results for orders placed during the hospital encounter of 05/31/12 (from the past 48 hour(s))  CBC     Status: Abnormal   Collection Time    05/31/12  5:53 AM      Result Value Range   WBC 18.3 (*) 4.0 - 10.5 K/uL   RBC 3.24 (*) 4.22 - 5.81 MIL/uL   Hemoglobin 10.4 (*) 13.0 - 17.0 g/dL   HCT 91.4 (*) 78.2 - 95.6 %   MCV 92.3  78.0 - 100.0 fL   MCH 32.1  26.0 - 34.0 pg   MCHC 34.8  30.0 - 36.0 g/dL   RDW 21.3  08.6 - 57.8 %   Platelets 228  150 - 400 K/uL  COMPREHENSIVE METABOLIC PANEL     Status: Abnormal   Collection Time    05/31/12  5:53 AM      Result Value Range   Sodium 140  135 - 145 mEq/L   Potassium 3.2 (*) 3.5  - 5.1 mEq/L   Chloride 101  96 - 112 mEq/L   CO2 25  19 - 32 mEq/L   Glucose, Bld 183 (*) 70 - 99 mg/dL   BUN 18  6 - 23 mg/dL   Creatinine, Ser 4.69  0.50 - 1.35 mg/dL   Calcium 9.0  8.4 - 62.9 mg/dL   Total Protein 7.8  6.0 - 8.3 g/dL   Albumin 3.2 (*) 3.5 - 5.2 g/dL   AST 67 (*) 0 - 37 U/L   ALT 49  0 - 53 U/L   Alkaline Phosphatase 81  39 - 117 U/L   Total Bilirubin 0.6  0.3 - 1.2 mg/dL   GFR calc non Af Amer >90  >90 mL/min   GFR calc Af Amer >90  >90 mL/min   Comment:            The eGFR has been calculated     using the CKD EPI equation.     This calculation has not been     validated in all clinical     situations.     eGFR's persistently     <  90 mL/min signify     possible Chronic Kidney Disease.  PROTIME-INR     Status: Abnormal   Collection Time    05/31/12  5:53 AM      Result Value Range   Prothrombin Time 15.8 (*) 11.6 - 15.2 seconds   INR 1.29  0.00 - 1.49  TYPE AND SCREEN     Status: None   Collection Time    05/31/12  6:00 AM      Result Value Range   ABO/RH(D) O POS     Antibody Screen NEG     Sample Expiration 06/03/2012    POCT I-STAT, CHEM 8     Status: Abnormal   Collection Time    05/31/12  6:05 AM      Result Value Range   Sodium 142  135 - 145 mEq/L   Potassium 3.2 (*) 3.5 - 5.1 mEq/L   Chloride 102  96 - 112 mEq/L   BUN 19  6 - 23 mg/dL   Creatinine, Ser 4.09  0.50 - 1.35 mg/dL   Glucose, Bld 811 (*) 70 - 99 mg/dL   Calcium, Ion 9.14  7.82 - 1.30 mmol/L   TCO2 27  0 - 100 mmol/L   Hemoglobin 11.6 (*) 13.0 - 17.0 g/dL   HCT 95.6 (*) 21.3 - 08.6 %  CG4 I-STAT (LACTIC ACID)     Status: Abnormal   Collection Time    05/31/12  6:05 AM      Result Value Range   Lactic Acid, Venous 5.67 (*) 0.5 - 2.2 mmol/L  POCT I-STAT 3, BLOOD GAS (G3+)     Status: Abnormal   Collection Time    05/31/12  6:19 AM      Result Value Range   pH, Arterial 7.310 (*) 7.350 - 7.450   pCO2 arterial 52.5 (*) 35.0 - 45.0 mmHg   pO2, Arterial 147.0 (*) 80.0 -  100.0 mmHg   Bicarbonate 26.4 (*) 20.0 - 24.0 mEq/L   TCO2 28  0 - 100 mmol/L   O2 Saturation 99.0     Collection site RADIAL, ALLEN'S TEST ACCEPTABLE     Drawn by Operator     Sample type ARTERIAL     Ct Angio Chest Pe W/cm &/or Wo Cm  05/31/2012  *RADIOLOGY REPORT*  Clinical Data: Bleeding from tracheostomy.  CT ANGIOGRAPHY CHEST  Technique:  Multidetector CT imaging of the chest using the standard protocol during bolus administration of intravenous contrast. Multiplanar reconstructed images including MIPs were obtained and reviewed to evaluate the vascular anatomy.  Contrast: OMNIPAQUE IOHEXOL 350 MG/ML SOLN  Comparison: Chest radiograph 05/31/2012  Findings: The study has technical limitations due to excessive motion.  There is no evidence for a large or central pulmonary embolism.  No significant pericardial or pleural fluid.  There is perihepatic ascites.  Tracheostomy tube is present.  There is no significant chest lymphadenopathy.  The trachea and mainstem bronchi are patent.  There are patchy densities in both lungs that are most compatible with areas of atelectasis, particularly in the lower lobes.  There is no large area of parenchymal or airspace disease.  Degenerative changes in the thoracic spine.  There is a mildly displaced fracture in the lower sternum.  The lower aspect of the sternum is posteriorly displaced.  There is no significant retrosternal fluid or blood. Fractures of the anterior right fourth, fifth and sixth ribs. There may also be a fracture of the right third rib. It is difficult  to evaluate for fractures due to the excessive motion.  IMPRESSION: The examination is limited due to excessive patient motion.  There is no evidence to suggest a large or central pulmonary embolism.  Fracture of the sternum and right anterior rib fractures.  Findings are most likely related to recent cardiopulmonary resuscitation.  Bilateral atelectasis in the lungs.  Small amount of perihepatic  ascites of unknown etiology.   Original Report Authenticated By: Richarda Overlie, M.D.    Dg Chest Portable 1 View  05/31/2012  *RADIOLOGY REPORT*  Clinical Data: Shortness of breath.  Tracheostomy.  PORTABLE CHEST - 1 VIEW  Comparison: 05/23/2012  Findings: Tracheostomy tube remains in place.  Low lung volumes are seen.  Multiple objects are seen overlying the chest, however no evidence of acute pulmonary consolidation seen.  No definite pleural effusion or pneumothorax visualized.  IMPRESSION: Low lung volumes and overlying objects limit evaluation.  No acute findings demonstrated.   Original Report Authenticated By: Myles Rosenthal, M.D.    Dg Swallowing Func-speech Pathology  05/30/2012  Breck Coons [place], CCC-SLP     05/30/2012 10:28 AM Objective Swallowing Evaluation: Modified Barium Swallowing Study   Patient Details  Name: Keith [name] MRN: [id number] Date of Birth: 1940/03/01  Today's Date: 05/30/2012 Time: 4098-1191 SLP Time Calculation (min): 20 min  Past Medical History:  Past Medical History  Diagnosis Date  . HTN (hypertension)   . Hypercholesterolemia    Past Surgical History:  Past Surgical History  Procedure Laterality Date  . Tracheostomy  05/22/12    emergent, angioedema   HPI:  72 yo male admitted to [facility] with acute onset of stridor over 3  hours on 05/22/12, ? allergic reaction to Lisinopril.  Pt required  emergent intubation and surgical trach placement after unable to  be placed via laryngoscope after multiple attempts due to  excessive bleeding/lack of view.  Per CT, pt with diffuse  pharyngeal and upper airway edema - unknown soure.  CXR 05/22/12  suggest pulmonary edema rather than infiltrates.       Assessment / Plan / Recommendation Clinical Impression  Dysphagia Diagnosis: Mild oral phase dysphagia;Mild pharyngeal  phase dysphagia Clinical impression: MBS completed with PMSV donned.  Pt.  exhibited mild oral dysphagia with mild delay in transit with  cracker.  Mild pharyngeal dysphagia is  primarily motor based with  incomplete laryngeal closure resulting in laryngeal penetration  (silent) with cup sip thin barium.  A chin tuck posture improved  laryngeal protection during the swallow without entrance of  barium.  Min-mild (mod vallecular with cracker) vallecular and  pyriform sinus residue due to decreased tongue base retraction  and laryngeal elevation.  SLP recommends a Dys 3 diet texture and  thin liquids with chin tuck for liquids, pills whole in  applesauce, small sips, wear valve during po consumption.  Pt.  may be discharging soon.  Recommend follow up ST with home health  for further education regarding dysphagia and speaking valve.        Treatment Recommendation  Therapy as outlined in treatment plan below    Diet Recommendation Dysphagia 3 (Mechanical Soft);Thin liquid   Liquid Administration via: Cup;No straw Medication Administration: Whole meds with puree Supervision: Patient able to self feed;Full supervision/cueing  for compensatory strategies Compensations: Slow rate;Small sips/bites;Multiple dry swallows  after each bite/sip Postural Changes and/or Swallow Maneuvers: Seated upright 90  degrees    Other  Recommendations Oral Care Recommendations: Oral care BID   Follow Up Recommendations  Home health  SLP    Frequency and Duration min 2x/week  2 weeks   Pertinent Vitals/Pain none    SLP Swallow Goals Patient will utilize recommended strategies during swallow to  increase swallowing safety with: Modified independent assistance      Reason for Referral Objectively evaluate swallowing function   Oral Phase Oral Preparation/Oral Phase Oral Phase: Impaired Oral - Solids Oral - Puree: Delayed oral transit;Weak lingual manipulation   Pharyngeal Phase Pharyngeal Phase Pharyngeal Phase: Impaired Pharyngeal - Nectar Pharyngeal - Nectar Cup: Pharyngeal residue - pyriform  sinuses;Pharyngeal residue - posterior pharnyx;Reduced tongue  base retraction;Reduced laryngeal elevation Pharyngeal - Thin  Pharyngeal - Thin Teaspoon: Pharyngeal residue -  valleculae;Pharyngeal residue - pyriform sinuses;Reduced tongue  base retraction;Reduced laryngeal elevation Pharyngeal - Thin Cup: Penetration/Aspiration during  swallow;Pharyngeal residue - pyriform sinuses;Pharyngeal residue  - posterior pharnyx;Reduced airway/laryngeal closure;Reduced  laryngeal elevation;Reduced epiglottic inversion Penetration/Aspiration details (thin cup): Material enters  airway, remains ABOVE vocal cords and not ejected out Pharyngeal - Thin Straw: Not tested Pharyngeal - Solids Pharyngeal - Regular: Pharyngeal residue - valleculae;Reduced  tongue base retraction  Cervical Esophageal Phase    GO    Cervical Esophageal Phase Cervical Esophageal Phase: Leonarda Salon         Darrow Bussing.Ed CCC-SLP Pager [phone]  05/30/2012     Blood pressure 103/54, pulse 82, temperature 98.5 F (36.9 C), temperature source Oral, resp. rate 14, height 5\' 8"  (1.727 m), SpO2 99.00%.  PHYSICAL EXAM: Obtunded, but responds appropriately to questions.  Trach secure, with dried blood.  Abd distended, but non-tender.    Studies Reviewed:CXR, CT chest, labs    Assessment/Plan Tracheostomy site hemorrhage with obstruction and arrest.    Plan to OR for exploration and control hemorrhage.  Flo Shanks 05/31/2012, 8:28 AM

## 2012-05-31 NOTE — Transfer of Care (Signed)
Immediate Anesthesia Transfer of Care Note  Patient: Keith [name]  Procedure(s) Performed: Procedure(s): TRACHEOSTOMY (N/A) LARYNGOSCOPY (N/A)  Patient Location: ICU  Anesthesia Type:General  Level of Consciousness: sedated and unresponsive  Airway & Oxygen Therapy: Patient placed on Ventilator (see vital sign flow sheet for setting)  Post-op Assessment: Post -op Vital signs reviewed and stable  Post vital signs: Reviewed and stable  Complications: No apparent anesthesia complications

## 2012-05-31 NOTE — ED Notes (Addendum)
Patient transported to CT.  Respiratory therapist accompanied pt and nurse.

## 2012-05-31 NOTE — Anesthesia Postprocedure Evaluation (Signed)
  Anesthesia Post-op Note  Patient: Keith [name]  Procedure(s) Performed: Procedure(s): TRACHEOSTOMY (N/A) LARYNGOSCOPY (N/A)  Patient Location: ICU  Anesthesia Type:General  Level of Consciousness: sedated and unresponsive  Airway and Oxygen Therapy: Patient placed on Ventilator (see vital sign flow sheet for setting)  Post-op Pain: none  Post-op Assessment: Post-op Vital signs reviewed, Patient's Cardiovascular Status Stable, Respiratory Function Stable, Patent Airway and No signs of Nausea or vomiting  Post-op Vital Signs: Reviewed and stable  Complications: No apparent anesthesia complications

## 2012-05-31 NOTE — ED Notes (Signed)
Returned from CT.  Travel uneventful.

## 2012-05-31 NOTE — Anesthesia Preprocedure Evaluation (Signed)
Anesthesia Evaluation  Patient identified by MRN, date of birth, ID band Patient awake    Reviewed: Allergy & Precautions, H&P , NPO status , Patient's Chart, lab work & pertinent test results, reviewed documented beta blocker date and time   Airway Mallampati: III TM Distance: >3 FB Neck ROM: full Positive for:  Tracheal deviation   Dental   Pulmonary shortness of breath,  + rhonchi         Cardiovascular hypertension, On Medications Rhythm:regular Rate:Tachycardia     Neuro/Psych negative neurological ROS  negative psych ROS   GI/Hepatic negative GI ROS, Neg liver ROS,   Endo/Other  negative endocrine ROS  Renal/GU negative Renal ROS  negative genitourinary   Musculoskeletal   Abdominal   Peds  Hematology negative hematology ROS (+)   Anesthesia Other Findings See surgeon's H&P   See previous trach note from ED on 4/4  Reproductive/Obstetrics negative OB ROS                           Anesthesia Physical Anesthesia Plan  ASA: IV and emergent  Anesthesia Plan: General   Post-op Pain Management:    Induction: Intravenous  Airway Management Planned: Oral ETT, Video Laryngoscope Planned and Tracheostomy  Additional Equipment:   Intra-op Plan:   Post-operative Plan:   Informed Consent: I have reviewed the patients History and Physical, chart, labs and discussed the procedure including the risks, benefits and alternatives for the proposed anesthesia with the patient or authorized representative who has indicated his/her understanding and acceptance.   Dental Advisory Given  Plan Discussed with: CRNA and Surgeon  Anesthesia Plan Comments:         Anesthesia Quick Evaluation

## 2012-05-31 NOTE — Code Documentation (Signed)
Changed trach to Shiley #4, cuffed, disposable inner canula.

## 2012-05-31 NOTE — ED Notes (Signed)
Pt does require frequent trach suctioning.

## 2012-05-31 NOTE — Preoperative (Signed)
Beta Blockers   Reason not to administer Beta Blockers:Not Applicable 

## 2012-05-31 NOTE — ED Notes (Signed)
Family at bedside.  Updated on status.  CCM updated family on medical plan of care.

## 2012-05-31 NOTE — ED Notes (Signed)
Patient with history of acute shortness of breath since 0440 this am.  Patient with recent trach change yesterday.  Patient was in obvious distress per EMS, suctioning and oxygen by blow by enroute to ED.  Patient with increased shortness of breath enroute to ED.  Per family, original trach placed on 05/22/12, with change on 05/30/12.  Patient with cardiac arrest upon arrival to Trauma C stretcher.  MD at bedside.

## 2012-05-31 NOTE — ED Provider Notes (Signed)
History     CSN: [id number]  Arrival date & time 05/31/12  7829   First MD Initiated Contact with Patient 05/31/12 0559      Chief Complaint  Patient presents with  . Respiratory Distress    (Consider location/radiation/quality/duration/timing/severity/associated sxs/prior treatment) HPI Hx per EMS and family. Had trach last week for angioedema and yesterday had repeated trach procedure.  Tonight at home started having bleeding from trach site with trouble breathing. EMS reports adequate pulse ox and suctioning blood. On arrival to the ED, worsening SOB and persistent bleeding. As PT was moved from the stretcher he lost airway and pulses and CPR was initiated. Level 5 caveat applies as PT unable to provide any history Past Medical History  Diagnosis Date  . HTN (hypertension)   . Hypercholesterolemia     Past Surgical History  Procedure Laterality Date  . Tracheostomy  05/22/12    emergent, angioedema    No family history on file.  History  Substance Use Topics  . Smoking status: Never Smoker   . Smokeless tobacco: Not on file  . Alcohol Use: 7.0 oz/week    14 drink(s) per week     Comment: drinks daily      Review of Systems  Unable to perform ROS level 5 caveat applies  Allergies  Lisinopril  Home Medications   Current Outpatient Rx  Name  Route  Sig  Dispense  Refill  . amLODipine (NORVASC) 10 MG tablet   Oral   Take 1 tablet (10 mg total) by mouth daily.   30 tablet   0   . atorvastatin (LIPITOR) 40 MG tablet   Oral   Take 40 mg by mouth every morning.         . chlordiazePOXIDE (LIBRIUM) 25 MG capsule      1 tab 2 x a day , then 1 x a day till 4-15 then stop   8 capsule   0   . dexamethasone (DECADRON) 0.75 MG tablet   Oral   Take 13.5 tablets (10.125 mg total) by mouth every 8 (eight) hours.         . folic acid (FOLVITE) 1 MG tablet   Oral   Take 1 tablet (1 mg total) by mouth daily.         Marland Kitchen ipratropium (ATROVENT) 0.06 % nasal  spray   Nasal   Place 2 sprays into the nose 4 (four) times daily.         . mometasone (NASONEX) 50 MCG/ACT nasal spray   Nasal   Place 2 sprays into the nose daily.         Marland Kitchen thiamine 100 MG tablet   Oral   Take 1 tablet (100 mg total) by mouth daily.           BP 152/90  Pulse 116  Temp(Src) 98.5 F (36.9 C) (Oral)  Resp 24  SpO2 100%  Physical Exam  Constitutional: He appears well-developed and well-nourished.  HENT:  Head: Normocephalic and atraumatic.  Eyes: Conjunctivae are normal.  Neck:  Trach in place with surrounding clot and active bleeding  Cardiovascular:  tachycardic  Pulmonary/Chest:  resp distress on arrival  Abdominal: Soft. He exhibits no distension.  Musculoskeletal:  No c/c/e  Neurological:  intially awake and alert and then became unresponsive  Skin: Skin is warm and dry.    ED Course  Procedures (including critical care time)   Date: 05/31/2012  Rate: 138  Rhythm: atrial fibrillation  QRS Axis: normal  Intervals: normal  ST/T Wave abnormalities: nonspecific ST changes  Conduction Disutrbances:none  Narrative Interpretation:   Old EKG Reviewed: none available  Results for orders placed during the hospital encounter of 05/31/12  CBC      Result Value Range   WBC 18.3 (*) 4.0 - 10.5 K/uL   RBC 3.24 (*) 4.22 - 5.81 MIL/uL   Hemoglobin 10.4 (*) 13.0 - 17.0 g/dL   HCT 16.1 (*) 09.6 - 04.5 %   MCV 92.3  78.0 - 100.0 fL   MCH 32.1  26.0 - 34.0 pg   MCHC 34.8  30.0 - 36.0 g/dL   RDW 40.9  81.1 - 91.4 %   Platelets 228  150 - 400 K/uL  COMPREHENSIVE METABOLIC PANEL      Result Value Range   Sodium 140  135 - 145 mEq/L   Potassium 3.2 (*) 3.5 - 5.1 mEq/L   Chloride 101  96 - 112 mEq/L   CO2 25  19 - 32 mEq/L   Glucose, Bld 183 (*) 70 - 99 mg/dL   BUN 18  6 - 23 mg/dL   Creatinine, Ser 7.82  0.50 - 1.35 mg/dL   Calcium 9.0  8.4 - 95.6 mg/dL   Total Protein 7.8  6.0 - 8.3 g/dL   Albumin 3.2 (*) 3.5 - 5.2 g/dL   AST 67 (*) 0  - 37 U/L   ALT 49  0 - 53 U/L   Alkaline Phosphatase 81  39 - 117 U/L   Total Bilirubin 0.6  0.3 - 1.2 mg/dL   GFR calc non Af Amer >90  >90 mL/min   GFR calc Af Amer >90  >90 mL/min  PROTIME-INR      Result Value Range   Prothrombin Time 15.8 (*) 11.6 - 15.2 seconds   INR 1.29  0.00 - 1.49  POCT I-STAT, CHEM 8      Result Value Range   Sodium 142  135 - 145 mEq/L   Potassium 3.2 (*) 3.5 - 5.1 mEq/L   Chloride 102  96 - 112 mEq/L   BUN 19  6 - 23 mg/dL   Creatinine, Ser 2.13  0.50 - 1.35 mg/dL   Glucose, Bld 086 (*) 70 - 99 mg/dL   Calcium, Ion 5.78  4.69 - 1.30 mmol/L   TCO2 27  0 - 100 mmol/L   Hemoglobin 11.6 (*) 13.0 - 17.0 g/dL   HCT 62.9 (*) 52.8 - 41.3 %  CG4 I-STAT (LACTIC ACID)      Result Value Range   Lactic Acid, Venous 5.67 (*) 0.5 - 2.2 mmol/L  POCT I-STAT 3, BLOOD GAS (G3+)      Result Value Range   pH, Arterial 7.310 (*) 7.350 - 7.450   pCO2 arterial 52.5 (*) 35.0 - 45.0 mmHg   pO2, Arterial 147.0 (*) 80.0 - 100.0 mmHg   Bicarbonate 26.4 (*) 20.0 - 24.0 mEq/L   TCO2 28  0 - 100 mmol/L   O2 Saturation 99.0     Collection site RADIAL, ALLEN'S TEST ACCEPTABLE     Drawn by Operator     Sample type ARTERIAL    TYPE AND SCREEN      Result Value Range   ABO/RH(D) O POS     Antibody Screen NEG     Sample Expiration 06/03/2012     Ct Soft Tissue Neck W Contrast  05/22/2012  *RADIOLOGY REPORT*  Clinical Data: Stridor. Upper airway obstruction.  Emergent tracheostomy.  CT NECK WITH CONTRAST  Technique:  Multidetector CT imaging of the neck was performed with intravenous contrast.  Contrast: 75mL OMNIPAQUE IOHEXOL 300 MG/ML  SOLN  Comparison: None.  Findings: There is extensive subcutaneous emphysema related to tracheostomy placement.  Tracheostomy good position in the subglottic region.  There is diffuse edema of the oropharynx, hypopharynx, supraglottic soft tissues, and larynx.  Airway is completely obliterated at the supraglottic and glottic level. Air column is  considerably collapsed in the oropharynx and hypopharynx.  I do not see evidence for neoplasm or an abscess. The craniocervical vasculature is widely patent.   There is mild cervical spondylosis worst at C5-6 and C6-7.  There is asymmetric airspace opacity in the right upper lobe, with right greater than left pleural effusions.  This could represent dependent edema although aspiration not excluded.  CT chest recommended for further evaluation.  The visualized intracranial compartment is unremarkable.  IMPRESSION: Findings most consistent with diffuse pharyngeal and upper airway edema from an unknown process, possible severe allergic reaction? There is no definite neoplasm or inflammatory process identified although there is considerable distortion from subcutaneous emphysema.  Subglottic tracheostomy good position.  Asymmetric airspace opacity right upper lobe with right greater than left pleural effusions.  Question pulmonary edema versus aspiration pneumonia.  Consider CT chest.   Original Report Authenticated By: Davonna Belling, M.D.    Ct Angio Chest Pe W/cm &/or Wo Cm  05/31/2012  *RADIOLOGY REPORT*  Clinical Data: Bleeding from tracheostomy.  CT ANGIOGRAPHY CHEST  Technique:  Multidetector CT imaging of the chest using the standard protocol during bolus administration of intravenous contrast. Multiplanar reconstructed images including MIPs were obtained and reviewed to evaluate the vascular anatomy.  Contrast: OMNIPAQUE IOHEXOL 350 MG/ML SOLN  Comparison: Chest radiograph 05/31/2012  Findings: The study has technical limitations due to excessive motion.  There is no evidence for a large or central pulmonary embolism.  No significant pericardial or pleural fluid.  There is perihepatic ascites.  Tracheostomy tube is present.  There is no significant chest lymphadenopathy.  The trachea and mainstem bronchi are patent.  There are patchy densities in both lungs that are most compatible with areas of  atelectasis, particularly in the lower lobes.  There is no large area of parenchymal or airspace disease.  Degenerative changes in the thoracic spine.  There is a mildly displaced fracture in the lower sternum.  The lower aspect of the sternum is posteriorly displaced.  There is no significant retrosternal fluid or blood. Fractures of the anterior right fourth, fifth and sixth ribs. There may also be a fracture of the right third rib. It is difficult to evaluate for fractures due to the excessive motion.  IMPRESSION: The examination is limited due to excessive patient motion.  There is no evidence to suggest a large or central pulmonary embolism.  Fracture of the sternum and right anterior rib fractures.  Findings are most likely related to recent cardiopulmonary resuscitation.  Bilateral atelectasis in the lungs.  Small amount of perihepatic ascites of unknown etiology.   Original Report Authenticated By: Richarda Overlie, M.D.    Dg Chest Portable 1 View  05/31/2012  *RADIOLOGY REPORT*  Clinical Data: Shortness of breath.  Tracheostomy.  PORTABLE CHEST - 1 VIEW  Comparison: 05/23/2012  Findings: Tracheostomy tube remains in place.  Low lung volumes are seen.  Multiple objects are seen overlying the chest, however no evidence of acute pulmonary consolidation seen.  No definite pleural effusion or pneumothorax visualized.  IMPRESSION: Low  lung volumes and overlying objects limit evaluation.  No acute findings demonstrated.   Original Report Authenticated By: Myles Rosenthal, M.D.    Dg Chest Port 1 View  05/23/2012  *RADIOLOGY REPORT*  Clinical Data: Post tracheostomy placement, follow-up the  PORTABLE CHEST - 1 VIEW  Comparison: Portable chest x-ray of 05/22/2012  Findings: The tracheostomy appears unchanged in position.  The lungs are not optimally aerated with mild basilar volume loss. Cardiomegaly is stable.  Airspace disease has improved.  IMPRESSION: No apparent change in tracheostomy placement.  Diminished aeration  although airspace disease has improved.   Original Report Authenticated By: Dwyane Dee, M.D.    Dg Chest Port 1 View  05/22/2012  *RADIOLOGY REPORT*  Clinical Data: Tracheostomy tube  PORTABLE CHEST - 1 VIEW  Comparison: None.  Findings: Diffuse airspace disease right greater than left. Tracheostomy tube in place.  Tip is 7.5 cm from the carina.  Left supraclavicular subcutaneous emphysema is suspected.  No pneumothorax.  Normal heart size.  Low volumes.  IMPRESSION: Tracheostomy tube in place.  Diffuse airspace disease worrisome for pulmonary edema.  Left supraclavicular subcutaneous emphysema is suspected.  No pneumothorax.   Original Report Authenticated By: Jolaine Click, M.D.    Dg Abd Portable 1v  05/26/2012  *RADIOLOGY REPORT*  Clinical Data: Evaluate feeding tube placement  PORTABLE ABDOMEN - 1 VIEW  Comparison: Portable abdomen of 05/26/2012  Findings: The tip of the feeding tube has advanced into the fundus of the stomach.  The bowel gas pattern is nonspecific.  IMPRESSION: Tip of feeding tube now in fundus of stomach.   Original Report Authenticated By: Dwyane Dee, M.D.    Dg Abd Portable 1v  05/26/2012  *RADIOLOGY REPORT*  Clinical Data: Verify feeding tube placement.  PORTABLE ABDOMEN - 1 VIEW  Comparison: Chest x-ray 05/23/2012  Findings: Feeding tube has been placed, tip overlying the level of the gastroesophageal junction.  Consider advancing tube tip approximately 10 cm.  Bowel gas pattern is nonobstructive. Visualized osseous structures have a normal appearance.  IMPRESSION: Feeding tube tip overlying the gastroesophageal junction region.   Original Report Authenticated By: Norva Pavlov, M.D.    Dg Swallowing Func-speech Pathology  05/30/2012  Breck Coons Bowers, CCC-SLP     05/30/2012 10:28 AM Objective Swallowing Evaluation: Modified Barium Swallowing Study   Patient Details  Name: Keith [name] MRN: [id number] Date of Birth: 10-11-1940  Today's Date: 05/30/2012 Time: 4098-1191 SLP  Time Calculation (min): 20 min  Past Medical History:  Past Medical History  Diagnosis Date  . HTN (hypertension)   . Hypercholesterolemia    Past Surgical History:  Past Surgical History  Procedure Laterality Date  . Tracheostomy  05/22/12    emergent, angioedema   HPI:  72 yo male admitted to [facility] with acute onset of stridor over 3  hours on 05/22/12, ? allergic reaction to Lisinopril.  Pt required  emergent intubation and surgical trach placement after unable to  be placed via laryngoscope after multiple attempts due to  excessive bleeding/lack of view.  Per CT, pt with diffuse  pharyngeal and upper airway edema - unknown soure.  CXR 05/22/12  suggest pulmonary edema rather than infiltrates.       Assessment / Plan / Recommendation Clinical Impression  Dysphagia Diagnosis: Mild oral phase dysphagia;Mild pharyngeal  phase dysphagia Clinical impression: MBS completed with PMSV donned.  Pt.  exhibited mild oral dysphagia with mild delay in transit with  cracker.  Mild pharyngeal dysphagia is primarily motor based with  incomplete laryngeal closure resulting in laryngeal penetration  (silent) with cup sip thin barium.  A chin tuck posture improved  laryngeal protection during the swallow without entrance of  barium.  Min-mild (mod vallecular with cracker) vallecular and  pyriform sinus residue due to decreased tongue base retraction  and laryngeal elevation.  SLP recommends a Dys 3 diet texture and  thin liquids with chin tuck for liquids, pills whole in  applesauce, small sips, wear valve during po consumption.  Pt.  may be discharging soon.  Recommend follow up ST with home health  for further education regarding dysphagia and speaking valve.        Treatment Recommendation  Therapy as outlined in treatment plan below    Diet Recommendation Dysphagia 3 (Mechanical Soft);Thin liquid   Liquid Administration via: Cup;No straw Medication Administration: Whole meds with puree Supervision: Patient able to self feed;Full  supervision/cueing  for compensatory strategies Compensations: Slow rate;Small sips/bites;Multiple dry swallows  after each bite/sip Postural Changes and/or Swallow Maneuvers: Seated upright 90  degrees    Other  Recommendations Oral Care Recommendations: Oral care BID   Follow Up Recommendations  Home health SLP    Frequency and Duration min 2x/week  2 weeks   Pertinent Vitals/Pain none    SLP Swallow Goals Patient will utilize recommended strategies during swallow to  increase swallowing safety with: Modified independent assistance      Reason for Referral Objectively evaluate swallowing function   Oral Phase Oral Preparation/Oral Phase Oral Phase: Impaired Oral - Solids Oral - Puree: Delayed oral transit;Weak lingual manipulation   Pharyngeal Phase Pharyngeal Phase Pharyngeal Phase: Impaired Pharyngeal - Nectar Pharyngeal - Nectar Cup: Pharyngeal residue - pyriform  sinuses;Pharyngeal residue - posterior pharnyx;Reduced tongue  base retraction;Reduced laryngeal elevation Pharyngeal - Thin Pharyngeal - Thin Teaspoon: Pharyngeal residue -  valleculae;Pharyngeal residue - pyriform sinuses;Reduced tongue  base retraction;Reduced laryngeal elevation Pharyngeal - Thin Cup: Penetration/Aspiration during  swallow;Pharyngeal residue - pyriform sinuses;Pharyngeal residue  - posterior pharnyx;Reduced airway/laryngeal closure;Reduced  laryngeal elevation;Reduced epiglottic inversion Penetration/Aspiration details (thin cup): Material enters  airway, remains ABOVE vocal cords and not ejected out Pharyngeal - Thin Straw: Not tested Pharyngeal - Solids Pharyngeal - Regular: Pharyngeal residue - valleculae;Reduced  tongue base retraction  Cervical Esophageal Phase    GO    Cervical Esophageal Phase Cervical Esophageal Phase: Leonarda Salon         Darrow Bussing.Ed CCC-SLP Pager [phone]  05/30/2012     Cardiopulmonary Resuscitation (CPR) Procedure Note Directed/Performed by: Sunnie Nielsen I personally directed ancillary staff  and/or performed CPR in an effort to regain return of spontaneous circulation and to maintain cardiac, neuro and systemic perfusion. 2-3 minutes of CPR performed with chest compressions performed by me, with continued airway suctioning. Respiratory therapy bedside assisted in removing trach and replacing it with cuffed trach, at which time, we were able to ventilate and shortly after patient regained pulses. Initial EKG reviewed as above narrow complex tachycardial. Once airway secured, patient began to wake up and follow commands.   CRITICAL CARE Performed by: Sunnie Nielsen   Total critical care time: 30  Critical care time was exclusive of separately billable procedures and treating other patients.  Critical care was necessary to treat or prevent imminent or life-threatening deterioration.  Critical care was time spent personally by me on the following activities: development of treatment plan with patient and/or surrogate as well as nursing, discussions with consultants, evaluation of patient's response to treatment, examination of patient, obtaining history from patient  or surrogate, ordering and performing treatments and interventions, ordering and review of laboratory studies, ordering and review of radiographic studies, pulse oximetry and re-evaluation of patient's condition.    6:01 AM d/w ENT Dr Lazarus Salines will see in am  6:01 AM d/w PCCM, will eval in ED for admit   MDM  Cardiac arrest 2/2 resp arrest 2/2 bleeding trach site/ clotted airway  Uncuffed trach replaced with cuffed trach and suctioning/ saline to open airway  Brief CPR with ROSC once airway established, no ACLS meds given  Imaging, labs, ECG, ABG, serial evalautions  ENT and PCCM consults  Placed back onto mechanical ventilation  ICU admit - I suspect cuffed trach has stopped active bleeding      Sunnie Nielsen, MD 05/31/12 [phone]

## 2012-05-31 NOTE — Op Note (Signed)
05/31/2012  10:03 AM    [name], Keith Favre  [id number]   Pre-Op Dx:  Tracheostomy hemorrhage  Post-op Dx: Same  Proc: Direct laryngoscopy, bronchoscopy, revision tracheostomy.   Surg:  Flo Shanks T MD  Anes:  GOT  EBL:  25 cc  Comp:  None  Findings:  Residual supraglottic swelling. No active bleeding in the pharynx. A well-established tracheostomy tract. Clots and proteinaceous eschar in the trach wound. Upon exploring the wound, brisk bleeding from a sidewall injury to the right anterior jugular vein. Moderately large clot evacuated from the trachea. After control of bleeding, the flexible laryngoscope was used to inspect the trachea down to the level of the mainstem bronchi with no additional clots.  Procedure: With the patient in a comfortable supine position, general anesthesia was administered per indwelling tracheostomy tube. The table was turned 90. The patient placed in a slight reverse Trendelenburg. A rubber tooth guard was applied.  The anterior commissure laryngoscope was used to inspect the pharynx. There was some frothy sputum with some blood in it. The endolarynx was inspected with the findings as described above. Laryngoscope was removed. A Jackson sliding laryngoscope was introduced and the patient was intubated with a 6-1/2 ETT. The indwelling tracheostomy tube was removed to allow the oral tube to be placed in a good position. Ventilation was assumed per ETT.  A clean preparation and draping of the trach stoma was accomplished. Under direct vision, several proteinaceous clots and possible some residual Surgicel was removed. Oozing was noticed in the right aspect. Upon exploring this further, there was a sidewall injury in the anterior jugular vein which bled briskly. The vein was isolated between hemostats, divided, and controlled with 3-0 silk ligature. A small amount of electrocautery was required at some oozing areas diffusely. A large clot was noted in the  tracheal lumen was removed directly.  A #4 cuffed Shiley tracheostomy tube was tested and available on the field. The patient was extubated. The flexible laryngoscope was introduced into the trachea through the tracheostomy site and directed down to the carina. No additional clots were noted. The scope was removed.  The tracheostomy tube was placed without difficulty. The cuff was inflated and observed to be intact and containing air. Ventilation was assumed per tracheostomy tube. CO2 return was noted. Hemostasis was observed. The tracheostomy tube was secured with Velcro straps in the standard fashion.  The procedure was completed. The patient was returned to anesthesia and transferred to 2100 in stable condition.  Dispo:    OR to  2100 under the critical care service  Plan:   routine trach care. Anticipate reduction in laryngeal edema sufficient to allow decannulation in the very near future. Will be okay for discharge home from my standpoint promptly.   Cephus Richer MD

## 2012-05-31 NOTE — ED Notes (Signed)
Vital signs stable. 

## 2012-05-31 NOTE — H&P (Signed)
PULMONARY  / CRITICAL CARE MEDICINE  Name: Keith [name] MRN: [id number] DOB: 02/19/1941    ADMISSION DATE:  05/31/2012  PRIMARY SERVICE: PCCM  CHIEF COMPLAINT:  Tracheostomy site bleeding, occlusion and brief cardiac arrest.  BRIEF PATIENT DESCRIPTION: 72 years old male with PMH relevant for HTN, dyslipidemia and alcohol abuse. Recently discharged after an episode of angioedema requiring emergent tracheostomy. Presents with bleeding and total occlusion of tracheostomy that resulted in cardiac arrest requiring CPR. He returned to spontaneous circulation and regain consciousness after 2 minutes of CPR.   LINES / TUBES: - Peripheral IV's  CULTURES: N/A  ANTIBIOTICS: Not indicated  HISTORY OF PRESENT ILLNESS:   72 years old male with PMH relevant for HTN, dyslipidemia and alcohol abuse. Recently discharged after an episode of angioedema likely secondary to ACE inhibitors. He required emergent tracheostomy. Apparently his tracheostomy was downsized today. Presents with bleeding and total occlusion of tracheostomy that resulted in cardiac arrest requiring CPR. He returned to spontaneous circulation and regained consciousness after 2 minutes of CPR. At the time of my examination the patient is awake, alert, oriented and hemodynamically stable. He complains of chest wall pain.   PAST MEDICAL HISTORY :  Past Medical History  Diagnosis Date  . HTN (hypertension)   . Hypercholesterolemia    Past Surgical History  Procedure Laterality Date  . Tracheostomy  05/22/12    emergent, angioedema   Prior to Admission medications   Medication Sig Start Date End Date Taking? Authorizing Provider  amLODipine (NORVASC) 10 MG tablet Take 1 tablet (10 mg total) by mouth daily. 05/30/12   Coralyn Helling, MD  atorvastatin (LIPITOR) 40 MG tablet Take 40 mg by mouth every morning.    Historical Provider, MD  chlordiazePOXIDE (LIBRIUM) 25 MG capsule 1 tab 2 x a day , then 1 x a day till 4-15 then stop  05/30/12   Coralyn Helling, MD  dexamethasone (DECADRON) 0.75 MG tablet Take 13.5 tablets (10.125 mg total) by mouth every 8 (eight) hours. 05/30/12 05/31/12  Coralyn Helling, MD  folic acid (FOLVITE) 1 MG tablet Take 1 tablet (1 mg total) by mouth daily. 05/30/12   Coralyn Helling, MD  ipratropium (ATROVENT) 0.06 % nasal spray Place 2 sprays into the nose 4 (four) times daily.    Historical Provider, MD  mometasone (NASONEX) 50 MCG/ACT nasal spray Place 2 sprays into the nose daily.    Historical Provider, MD  thiamine 100 MG tablet Take 1 tablet (100 mg total) by mouth daily. 05/31/12   Coralyn Helling, MD   Allergies  Allergen Reactions  . Lisinopril Other (See Comments)    Presented with laryngeal edema - likely caused is lisinopril     FAMILY HISTORY:  No family history on file. SOCIAL HISTORY:  reports that he has never smoked. He does not have any smokeless tobacco history on file. He reports that he drinks about 7.0 ounces of alcohol per week. He reports that he does not use illicit drugs.  REVIEW OF SYSTEMS:  All systems reviewed and found negative except for what I mentioned in the HPI.  SUBJECTIVE:   VITAL SIGNS: Temp:  [98.5 F (36.9 C)] 98.5 F (36.9 C) (04/12 0555) Pulse Rate:  [96-141] 122 (04/12 0600) Resp:  [16-30] 30 (04/12 0600) BP: (89-152)/(64-93) 135/88 mmHg (04/12 0600) SpO2:  [97 %-100 %] 100 % (04/12 0600) FiO2 (%):  [28 %] 28 % (04/11 1217) Weight:  [203 lb 11.3 oz (92.4 kg)] 203 lb 11.3 oz (92.4 kg) (04/11  0700) HEMODYNAMICS:   VENTILATOR SETTINGS: Vent Mode:  [-]  FiO2 (%):  [28 %] 28 % INTAKE / OUTPUT: Intake/Output   None     PHYSICAL EXAMINATION: General: No apparent distress. Eyes: Anicteric sclerae. ENT: Oropharynx clear. Moist mucous membranes. No thrush. Tracheostomy site with fresh blood but cannot identify bleeding site. Lymph: No cervical, supraclavicular, or axillary lymphadenopathy. Heart: Normal S1, S2. No murmurs, rubs, or gallops appreciated. No  bruits, equal pulses. Lungs: Normal excursion, no dullness to percussion. Good air movement bilaterally, without wheezes or crackles. Normal upper airway sounds without evidence of stridor. Abdomen: Abdomen soft, non-tender and not distended, normoactive bowel sounds. No hepatosplenomegaly or masses. Musculoskeletal: No clubbing or synovitis. Skin: No rashes or lesions Neuro: No focal neurologic deficits. LABS:  Recent Labs Lab 05/25/12 0603 05/27/12 0451 05/29/12 0530 05/29/12 0545 05/31/12 0553 05/31/12 0605 05/31/12 0619  HGB 10.6* 9.5* 9.6*  --  10.4* 11.6*  --   WBC 5.5 8.8 7.5  --  18.3*  --   --   PLT 113* 144* 130*  --  228  --   --   NA 135 141  --  137  --  142  --   K 4.2 3.6  --  3.3*  --  3.2*  --   CL 100 105  --  100  --  102  --   CO2 28 28  --  31  --   --   --   GLUCOSE 141* 101*  --  134*  --  186*  --   BUN 19 30*  --  16  --  19  --   CREATININE 0.71 0.76  --  0.84  --  0.70  --   CALCIUM 9.1 8.8  --  8.4  --   --   --   MG  --  2.4  --  1.9  --   --   --   PHOS  --  3.2  --  3.6  --   --   --   INR  --   --   --  1.37 1.29  --   --   LATICACIDVEN  --   --   --   --   --  5.67*  --   PHART  --   --   --   --   --   --  7.310*  PCO2ART  --   --   --   --   --   --  52.5*  PO2ART  --   --   --   --   --   --  147.0*    Recent Labs Lab 05/27/12 2004 05/27/12 2330 05/28/12 0338 05/28/12 0737 05/28/12 1131  GLUCAP 99 113* 137* 117* 123*    CXR: No acute infiltrates. Low lung volumes  ASSESSMENT / PLAN:  PULMONARY A: 1) Saturating well on tracheostomy collar 2) Respiratory acidosis ABG: 7.31, 52, 147, 26 (immediatelly after code) P:   - Will continue tracheostomy collar to keep O2 sat >94% - Will follow ABG in 1 hr, suspect his CO2 and pH will be back to normal. - ENT consulted from the ED - Will follow CT of the chest ordered in the ED  CARDIOVASCULAR A:  1) Hemodynamically stable P:  - We will hold antihypertensive drugs for now -  Will follow CBC at 12:00 pm  RENAL A:   1) No issues  GASTROINTESTINAL A:   1)  No issues P:   - Pepcid IV  HEMATOLOGIC A:   1) Hgb stable  P:  - No indication for transfusion. - Will follow CBC at 12:00 pm  INFECTIOUS A:   1) No evidence of acute infection P:   - Will monitor fever curve - No antibiotics  ENDOCRINE A:   1) No issues     NEUROLOGIC A:   1) Awake, following commands P: Pain management with PRN morphine   I have personally obtained a history, examined the patient, evaluated laboratory and imaging results, formulated the assessment and plan and placed orders. CRITICAL CARE:  Critical Care Time devoted to patient care services described in this note is 45 minutes.   Overton Mam, MD Pulmonary and Critical Care Medicine [facility] Pager: [phone]  05/31/2012, 6:34 AM

## 2012-05-31 NOTE — ED Notes (Signed)
Spoke with critical care and ENT in route to see pt. ENT aware pt airway occluded off for the second time but cleared airway with saline and vigorous suction. Pt sats never dropped. Pt 100% on the vent currently and comfortable and resting. Was advised not to move the pt until ENT sees pt due to compromised airway.

## 2012-05-31 NOTE — Progress Notes (Signed)
INITIAL NUTRITION ASSESSMENT  DOCUMENTATION CODES Per approved criteria  -Not Applicable   INTERVENTION: - If pt unable to be weaned in next 1-2 days recommend TF initiation of Osmolite 1.2 start at 60ml/hr increase by 10ml every 4 hours to goal of 33ml/hr with Prostat 30ml BID. This will provide 1784 calories, 103g protein, and free water. If IVF d/c, recommend water flushes 4 times/day.  - Unit RD to continue to monitor   NUTRITION DIAGNOSIS: Inadequate oral intake related to inability to eat as evidenced by NPO.   Goal: Initiation of TF with goal to meet >90% of estimated nutritional needs  Monitor:  Weights, labs, vent status  Reason for Assessment: Ventilated  72 y.o. male  Admitting Dx: Respiratory distress  ASSESSMENT: Pt admitted with bleeding from trach and trouble breathing. On arrival to the ED, pt developed worsening SOB and persistent bleeding. As Pt was moved from the stretcher he lost airway and pulses and CPR was initiated for brief cardiac arrest. Pt with history of HTN, dyslipidemia and alcohol abuse. Pt ventilated this morning due to trach being completely occluded. Pt had surgery of direct laryngoscopy, bronchoscopy, revision tracheostomy this morning.   Pt seen by RD during admission earlier in the month during which time pt had emergent trach placement for angio edema. Pt previously weighed 204 pounds on 05/22/12, now at 194 pounds. Pt previously on TF of Jevity 1.2 at 71ml/hr with Prostat 30ml once daily.   Patient is currently intubated on ventilator support.  MV: 7L/min Temp:Temp (24hrs), Avg:98 F (36.7 C), Min:97.5 F (36.4 C), Max:98.5 F (36.9 C)    Height: Ht Readings from Last 1 Encounters:  05/31/12 5\' 8"  (1.727 m)    Weight: Wt Readings from Last 1 Encounters:  05/31/12 194 lb 7.1 oz (88.2 kg)    Ideal Body Weight: 154 lb  % Ideal Body Weight: 126  Wt Readings from Last 10 Encounters:  05/31/12 194 lb 7.1 oz (88.2 kg)   05/31/12 194 lb 7.1 oz (88.2 kg)  05/30/12 203 lb 11.3 oz (92.4 kg)    Usual Body Weight: 203 lb  % Usual Body Weight: 96  BMI:  Body mass index is 29.57 kg/(m^2).  Estimated Nutritional Needs: Kcal: 1650 Protein: 105-130g Fluid: per MD  Skin: Neck incision  Diet Order:  NPO  EDUCATION NEEDS: -Education not appropriate at this time   Intake/Output Summary (Last 24 hours) at 05/31/12 1107 Last data filed at 05/31/12 1001  Gross per 24 hour  Intake    800 ml  Output      0 ml  Net    800 ml    Last BM: PTA  Labs:   Recent Labs Lab 05/25/12 0603 05/27/12 0451 05/29/12 0545 05/31/12 0553 05/31/12 0605  NA 135 141 137 140 142  K 4.2 3.6 3.3* 3.2* 3.2*  CL 100 105 100 101 102  CO2 28 28 31 25   --   BUN 19 30* 16 18 19   CREATININE 0.71 0.76 0.84 0.74 0.70  CALCIUM 9.1 8.8 8.4 9.0  --   MG  --  2.4 1.9  --   --   PHOS  --  3.2 3.6  --   --   GLUCOSE 141* 101* 134* 183* 186*    CBG (last 3)   Recent Labs  05/28/12 1131 05/31/12 1012  GLUCAP 123* 126*    Scheduled Meds: . atorvastatin  40 mg Oral q1800  . famotidine (PEPCID) IV  20 mg  Intravenous Q12H  . fluticasone  2 spray Each Nare Daily  . folic acid  1 mg Oral Daily  . ipratropium  2 spray Nasal QID  . thiamine  100 mg Oral Daily    Continuous Infusions:   Past Medical History  Diagnosis Date  . HTN (hypertension)   . Hypercholesterolemia     Past Surgical History  Procedure Laterality Date  . Tracheostomy  05/22/12    emergent, angioedema     Levon Hedger MS, RD, LDN [phone] Weekend/After Hours Pager

## 2012-05-31 NOTE — ED Notes (Signed)
I stat chem 8 and I stat lactic acid results given to Dr. Dierdre Highman by B. Bing Plume, EMT

## 2012-05-31 NOTE — Progress Notes (Addendum)
Pt went into PEA following respiratory failure. Existing trach was suctioned and found to be completely occluded. Janina Mayo was removed and replaced with #4 Shiley cuffed trach. Patient was bagged and suctioned. Patient regained pulse and spontaneous breathing. Bleeding has slowed considerably since the placement of the cuffed trach.

## 2012-06-01 ENCOUNTER — Inpatient Hospital Stay (HOSPITAL_COMMUNITY): Payer: Medicare Other

## 2012-06-01 LAB — CBC
HCT: 27.2 % — ABNORMAL LOW (ref 39.0–52.0)
MCH: 32.1 pg (ref 26.0–34.0)
MCV: 91 fL (ref 78.0–100.0)
Platelets: 177 10*3/uL (ref 150–400)
RBC: 2.99 MIL/uL — ABNORMAL LOW (ref 4.22–5.81)
WBC: 10 10*3/uL (ref 4.0–10.5)

## 2012-06-01 LAB — BASIC METABOLIC PANEL
BUN: 21 mg/dL (ref 6–23)
CO2: 29 mEq/L (ref 19–32)
Calcium: 8.2 mg/dL — ABNORMAL LOW (ref 8.4–10.5)
Chloride: 106 mEq/L (ref 96–112)
Creatinine, Ser: 0.77 mg/dL (ref 0.50–1.35)
Glucose, Bld: 110 mg/dL — ABNORMAL HIGH (ref 70–99)

## 2012-06-01 MED ORDER — SODIUM CHLORIDE 0.9 % IV BOLUS (SEPSIS)
500.0000 mL | Freq: Once | INTRAVENOUS | Status: AC
  Administered 2012-06-01: 500 mL via INTRAVENOUS

## 2012-06-01 MED ORDER — METOPROLOL TARTRATE 25 MG/10 ML ORAL SUSPENSION
25.0000 mg | Freq: Two times a day (BID) | ORAL | Status: DC
  Administered 2012-06-01: 25 mg via ORAL
  Filled 2012-06-01 (×2): qty 10

## 2012-06-01 MED ORDER — METOPROLOL TARTRATE 25 MG PO TABS
25.0000 mg | ORAL_TABLET | Freq: Two times a day (BID) | ORAL | Status: DC
  Administered 2012-06-02 – 2012-06-03 (×3): 25 mg via ORAL
  Filled 2012-06-01 (×5): qty 1

## 2012-06-01 MED ORDER — BIOTENE DRY MOUTH MT LIQD
15.0000 mL | Freq: Two times a day (BID) | OROMUCOSAL | Status: DC
  Administered 2012-06-01 – 2012-06-03 (×4): 15 mL via OROMUCOSAL

## 2012-06-01 NOTE — Progress Notes (Signed)
06/01/2012 2:52 PM  Davids, Marilu Favre [id number]  Post-Op Day 1    Temp:  [97.5 F (36.4 C)-98.7 F (37.1 C)] 97.5 F (36.4 C) (04/13 0745) Pulse Rate:  [60-117] 67 (04/13 1400) Resp:  [10-29] 11 (04/13 1400) BP: (84-127)/(47-79) 91/60 mmHg (04/13 1400) SpO2:  [95 %-100 %] 100 % (04/13 1400) FiO2 (%):  [40 %] 40 % (04/13 1212),     Intake/Output Summary (Last 24 hours) at 06/01/12 1452 Last data filed at 06/01/12 1300  Gross per 24 hour  Intake   2065 ml  Output    965 ml  Net   1100 ml    Results for orders placed during the hospital encounter of 05/31/12 (from the past 24 hour(s))  HEMOGLOBIN AND HEMATOCRIT, BLOOD     Status: Abnormal   Collection Time    05/31/12  7:52 PM      Result Value Range   Hemoglobin 8.8 (*) 13.0 - 17.0 g/dL   HCT 40.9 (*) 81.1 - 91.4 %  CBC     Status: Abnormal   Collection Time    06/01/12  5:35 AM      Result Value Range   WBC 10.0  4.0 - 10.5 K/uL   RBC 2.99 (*) 4.22 - 5.81 MIL/uL   Hemoglobin 9.6 (*) 13.0 - 17.0 g/dL   HCT 78.2 (*) 95.6 - 21.3 %   MCV 91.0  78.0 - 100.0 fL   MCH 32.1  26.0 - 34.0 pg   MCHC 35.3  30.0 - 36.0 g/dL   RDW 08.6  57.8 - 46.9 %   Platelets 177  150 - 400 K/uL  BASIC METABOLIC PANEL     Status: Abnormal   Collection Time    06/01/12  5:35 AM      Result Value Range   Sodium 141  135 - 145 mEq/L   Potassium 3.4 (*) 3.5 - 5.1 mEq/L   Chloride 106  96 - 112 mEq/L   CO2 29  19 - 32 mEq/L   Glucose, Bld 110 (*) 70 - 99 mg/dL   BUN 21  6 - 23 mg/dL   Creatinine, Ser 6.29  0.50 - 1.35 mg/dL   Calcium 8.2 (*) 8.4 - 10.5 mg/dL   GFR calc non Af Amer 90 (*) >90 mL/min   GFR calc Af Amer >90  >90 mL/min    SUBJECTIVE:  Min pain.  No SOB.  Ambulatory, limited.  Not yet spont voiding.  Reported Afib.  Eatin solid food.  OBJECTIVE:  Much more awake, alert.  Breathing easily with trach cuff down.  Speaking OK spont, better with PMV.  Wound clean.  IMPRESSION:  Control, tracheostomy wound hemorrhage.  New  onset Afib.  PLAN:    OK for anticoagulation for Afib as deemed necessary.  OK for discharge home with Shelbie Hutching Valve when ambulatory and voiding easily.  Recheck Dr. Emeline Darling this week.  Will probably be ready for decannulation soon.  Since the #4 is so small, would not necessarily downsize to a #4 cuffless.  Flo Shanks

## 2012-06-01 NOTE — Progress Notes (Signed)
PULMONARY  / CRITICAL CARE MEDICINE  Name: Keith [name] MRN: [id number] DOB: 03/31/1940    ADMISSION DATE:  05/31/2012  REFERRING MD :  EDP  CHIEF COMPLAINT:  Bleeding  BRIEF PATIENT DESCRIPTION:  72 yo male with angioedema from ACE inhibitor s/p tracheostomy and d/c home 4/11.  Returned to ED 4/12 with tracheal bleeding, respiratory/cardiac arrest with 5 minutes CPR.    SIGNIFICANT EVENTS / STUDIES:  4/12 Respiratory/cardiac arrest from tracheal bleeding.  Taken to OR by ENT.  Rib/sternal fracture from CPR.  LINES / TUBES: Trach >>   SUBJECTIVE:  Placed on trach collar this am.  VITAL SIGNS: Temp:  [97.5 F (36.4 C)-98.7 F (37.1 C)] 97.5 F (36.4 C) (04/13 0745) Pulse Rate:  [60-110] 91 (04/13 0810) Resp:  [6-29] 18 (04/13 0810) BP: (84-115)/(47-82) 99/60 mmHg (04/13 0600) SpO2:  [96 %-100 %] 100 % (04/13 0810) FiO2 (%):  [40 %] 40 % (04/13 0810) Weight:  [194 lb 7.1 oz (88.2 kg)] 194 lb 7.1 oz (88.2 kg) (04/12 1015) HEMODYNAMICS:   VENTILATOR SETTINGS: Vent Mode:  [-] PRVC FiO2 (%):  [40 %] 40 % Set Rate:  [14 bmp] 14 bmp Vt Set:  [240 mL-540 mL] 240 mL PEEP:  [5 cmH20] 5 cmH20 Plateau Pressure:  [14 cmH20-22 cmH20] 14 cmH20 INTAKE / OUTPUT: Intake/Output     04/12 0701 - 04/13 0700 04/13 0701 - 04/14 0700   I.V. (mL/kg) 1745 (19.8)    IV Piggyback 600    Total Intake(mL/kg) 2345 (26.6)    Urine (mL/kg/hr) 1115 (0.5)    Total Output 1115     Net +1230          Urine Occurrence 2 x      PHYSICAL EXAMINATION: General:  No distress Neuro:  Awake and alert. HEENT:  Trach in place Cardiovascular:  s1s2 irregular. Lungs:  Scattered rhonchi Abdomen: soft, non tender Musculoskeletal: no edema Skin:  No rashes  LABS:  Recent Labs Lab 05/27/12 0451  05/29/12 0545 05/31/12 0553 05/31/12 0605 05/31/12 0619 05/31/12 1328 05/31/12 1952 06/01/12 0535  HGB 9.5*  < >  --  10.4* 11.6*  --  9.0* 8.8* 9.6*  WBC 8.8  < >  --  18.3*  --   --  11.0*   --  10.0  PLT 144*  < >  --  228  --   --  179  --  177  NA 141  --  137 140 142  --   --   --  141  K 3.6  --  3.3* 3.2* 3.2*  --   --   --  3.4*  CL 105  --  100 101 102  --   --   --  106  CO2 28  --  31 25  --   --   --   --  29  GLUCOSE 101*  --  134* 183* 186*  --   --   --  110*  BUN 30*  --  16 18 19   --   --   --  21  CREATININE 0.76  --  0.84 0.74 0.70  --   --   --  0.77  CALCIUM 8.8  --  8.4 9.0  --   --   --   --  8.2*  MG 2.4  --  1.9  --   --   --   --   --   --   PHOS 3.2  --  3.6  --   --   --   --   --   --   AST  --   --   --  67*  --   --   --   --   --   ALT  --   --   --  49  --   --   --   --   --   ALKPHOS  --   --   --  81  --   --   --   --   --   BILITOT  --   --   --  0.6  --   --   --   --   --   PROT  --   --   --  7.8  --   --   --   --   --   ALBUMIN  --   --   --  3.2*  --   --   --   --   --   INR  --   --  1.37 1.29  --   --   --   --   --   LATICACIDVEN  --   --   --   --  5.67*  --   --   --   --   PHART  --   --   --   --   --  7.310*  --   --   --   PCO2ART  --   --   --   --   --  52.5*  --   --   --   PO2ART  --   --   --   --   --  147.0*  --   --   --   < > = values in this interval not displayed.  Recent Labs Lab 05/27/12 2330 05/28/12 0338 05/28/12 0737 05/28/12 1131 05/31/12 1012  GLUCAP 113* 137* 117* 123* 126*    Imaging: Ct Angio Chest Pe W/cm &/or Wo Cm  05/31/2012  *RADIOLOGY REPORT*  Clinical Data: Bleeding from tracheostomy.  CT ANGIOGRAPHY CHEST  Technique:  Multidetector CT imaging of the chest using the standard protocol during bolus administration of intravenous contrast. Multiplanar reconstructed images including MIPs were obtained and reviewed to evaluate the vascular anatomy.  Contrast: OMNIPAQUE IOHEXOL 350 MG/ML SOLN  Comparison: Chest radiograph 05/31/2012  Findings: The study has technical limitations due to excessive motion.  There is no evidence for a large or central pulmonary embolism.  No significant  pericardial or pleural fluid.  There is perihepatic ascites.  Tracheostomy tube is present.  There is no significant chest lymphadenopathy.  The trachea and mainstem bronchi are patent.  There are patchy densities in both lungs that are most compatible with areas of atelectasis, particularly in the lower lobes.  There is no large area of parenchymal or airspace disease.  Degenerative changes in the thoracic spine.  There is a mildly displaced fracture in the lower sternum.  The lower aspect of the sternum is posteriorly displaced.  There is no significant retrosternal fluid or blood. Fractures of the anterior right fourth, fifth and sixth ribs. There may also be a fracture of the right third rib. It is difficult to evaluate for fractures due to the excessive motion.  IMPRESSION: The examination is limited due to excessive patient motion.  There is no evidence to suggest a large or central pulmonary embolism.  Fracture of the sternum and right anterior rib fractures.  Findings are most likely related to recent cardiopulmonary resuscitation.  Bilateral atelectasis in the lungs.  Small amount of perihepatic ascites of unknown etiology.   Original Report Authenticated By: Richarda Overlie, M.D.    Dg Chest Port 1 View  06/01/2012  *RADIOLOGY REPORT*  Clinical Data: Follow up atelectasis.  Chronic ventilator dependent respiratory failure.  Recent cardiopulmonary resuscitation.  PORTABLE CHEST - 1 VIEW 06/01/2012 0614 hours:  Comparison: CTA chest yesterday.  Portable chest x-rays yesterday, 05/23/2012, 05/22/2012.  Findings: Tracheostomy tube tip below the thoracic inlet.  Cardiac silhouette enlarged but stable.  Suboptimal inspiration with mild bibasilar atelectasis, with improved aeration since yesterday. Pulmonary vascularity normal.  No new pulmonary parenchymal abnormalities.  IMPRESSION: Suboptimal inspiration accounts for bibasilar atelectasis, though aeration has improved since yesterday.  No new abnormalities.    Original Report Authenticated By: Hulan Saas, M.D.    Dg Chest Portable 1 View  05/31/2012  *RADIOLOGY REPORT*  Clinical Data: Shortness of breath.  Tracheostomy.  PORTABLE CHEST - 1 VIEW  Comparison: 05/23/2012  Findings: Tracheostomy tube remains in place.  Low lung volumes are seen.  Multiple objects are seen overlying the chest, however no evidence of acute pulmonary consolidation seen.  No definite pleural effusion or pneumothorax visualized.  IMPRESSION: Low lung volumes and overlying objects limit evaluation.  No acute findings demonstrated.   Original Report Authenticated By: Myles Rosenthal, M.D.    Dg Swallowing Func-speech Pathology  05/30/2012  Breck Coons [place], CCC-SLP     05/30/2012 10:28 AM Objective Swallowing Evaluation: Modified Barium Swallowing Study   Patient Details  Name: EMRAN MOLZAHN MRN: [id number] Date of Birth: 11-12-1940  Today's Date: 05/30/2012 Time: 7829-5621 SLP Time Calculation (min): 20 min  Past Medical History:  Past Medical History  Diagnosis Date  . HTN (hypertension)   . Hypercholesterolemia    Past Surgical History:  Past Surgical History  Procedure Laterality Date  . Tracheostomy  05/22/12    emergent, angioedema   HPI:  72 yo male admitted to [facility] with acute onset of stridor over 3  hours on 05/22/12, ? allergic reaction to Lisinopril.  Pt required  emergent intubation and surgical trach placement after unable to  be placed via laryngoscope after multiple attempts due to  excessive bleeding/lack of view.  Per CT, pt with diffuse  pharyngeal and upper airway edema - unknown soure.  CXR 05/22/12  suggest pulmonary edema rather than infiltrates.       Assessment / Plan / Recommendation Clinical Impression  Dysphagia Diagnosis: Mild oral phase dysphagia;Mild pharyngeal  phase dysphagia Clinical impression: MBS completed with PMSV donned.  Pt.  exhibited mild oral dysphagia with mild delay in transit with  cracker.  Mild pharyngeal dysphagia is primarily motor based with   incomplete laryngeal closure resulting in laryngeal penetration  (silent) with cup sip thin barium.  A chin tuck posture improved  laryngeal protection during the swallow without entrance of  barium.  Min-mild (mod vallecular with cracker) vallecular and  pyriform sinus residue due to decreased tongue base retraction  and laryngeal elevation.  SLP recommends a Dys 3 diet texture and  thin liquids with chin tuck for liquids, pills whole in  applesauce, small sips, wear valve during po consumption.  Pt.  may be discharging soon.  Recommend follow up ST with home health  for further education regarding dysphagia and speaking valve.        Treatment Recommendation  Therapy as  outlined in treatment plan below    Diet Recommendation Dysphagia 3 (Mechanical Soft);Thin liquid   Liquid Administration via: Cup;No straw Medication Administration: Whole meds with puree Supervision: Patient able to self feed;Full supervision/cueing  for compensatory strategies Compensations: Slow rate;Small sips/bites;Multiple dry swallows  after each bite/sip Postural Changes and/or Swallow Maneuvers: Seated upright 90  degrees    Other  Recommendations Oral Care Recommendations: Oral care BID   Follow Up Recommendations  Home health SLP    Frequency and Duration min 2x/week  2 weeks   Pertinent Vitals/Pain none    SLP Swallow Goals Patient will utilize recommended strategies during swallow to  increase swallowing safety with: Modified independent assistance      Reason for Referral Objectively evaluate swallowing function   Oral Phase Oral Preparation/Oral Phase Oral Phase: Impaired Oral - Solids Oral - Puree: Delayed oral transit;Weak lingual manipulation   Pharyngeal Phase Pharyngeal Phase Pharyngeal Phase: Impaired Pharyngeal - Nectar Pharyngeal - Nectar Cup: Pharyngeal residue - pyriform  sinuses;Pharyngeal residue - posterior pharnyx;Reduced tongue  base retraction;Reduced laryngeal elevation Pharyngeal - Thin Pharyngeal - Thin Teaspoon:  Pharyngeal residue -  valleculae;Pharyngeal residue - pyriform sinuses;Reduced tongue  base retraction;Reduced laryngeal elevation Pharyngeal - Thin Cup: Penetration/Aspiration during  swallow;Pharyngeal residue - pyriform sinuses;Pharyngeal residue  - posterior pharnyx;Reduced airway/laryngeal closure;Reduced  laryngeal elevation;Reduced epiglottic inversion Penetration/Aspiration details (thin cup): Material enters  airway, remains ABOVE vocal cords and not ejected out Pharyngeal - Thin Straw: Not tested Pharyngeal - Solids Pharyngeal - Regular: Pharyngeal residue - valleculae;Reduced  tongue base retraction  Cervical Esophageal Phase    GO    Cervical Esophageal Phase Cervical Esophageal Phase: Leonarda Salon         Darrow Bussing.Ed CCC-SLP Pager [phone]  05/30/2012      ASSESSMENT / PLAN:  PULMONARY A: Angioedema 2nd to ACE inhibitor s/p trach. Tracheal bleeding 4/12. Acute respiratory failure. Rib/sternal fracture after CPR. P:   Weaned to trach collar this am. Tolerating it well. Decadron per ENT  CARDIOVASCULAR A: Hypotension. Afib. Hx of HTN, hyperlipidemia. P:  - Bolus given. Maintain MAP>65. - No anticoagulation for Afib- considering recent bleeding. - Cardiology evaluation for new onset Afib- inpatient vs. Out-pt after D/C. No acute needs. Not in RVR. Might need 2D ECHO to begin with.  RENAL A:  Hypokalemia. P:   Replete F/u BMET  GASTROINTESTINAL A:  Nutrition. P:   Advance diet.  HEMATOLOGIC A:  Tracheal bleeding. P:  SCD for DVT prevention F/u CBC. Hb stable 9.6<9.0  INFECTIOUS A:  No evidence for infection. P:   Monitor clinically  ENDOCRINE A:  No hx of DM.   P:   Monitor CBG on BMET  NEUROLOGIC A:  Pain control. Hx of ETOH. P:   PRN  Fentanyl for pain. D/C versed.  PATEL,RAVI, MD 9:11 AM   Reviewed above, examined pt, and agree with assessment/plan.  He is having intermittent a fib with RVR.  Will add lopressor.  Will need further cardiac  evaluation at some point.    Will trial him off vent.  Advance diet and use speech valve.   Updated daughter at bedside.  If stable, then possible d/c home in 24 to 48 hrs.  Coralyn Helling, MD [facility] Pulmonary/Critical Care 06/01/2012, 9:05 AM Pager:  [phone] After 3pm call: [phone]

## 2012-06-01 NOTE — Progress Notes (Signed)
Placed patient on 40% trach collar with Sp02=99%. Patient appears to be in no distress at this time

## 2012-06-01 NOTE — Progress Notes (Signed)
Hypotension   NS bolus ordered 

## 2012-06-02 DIAGNOSIS — I4891 Unspecified atrial fibrillation: Secondary | ICD-10-CM

## 2012-06-02 DIAGNOSIS — I469 Cardiac arrest, cause unspecified: Secondary | ICD-10-CM

## 2012-06-02 DIAGNOSIS — J9600 Acute respiratory failure, unspecified whether with hypoxia or hypercapnia: Secondary | ICD-10-CM

## 2012-06-02 LAB — CBC
HCT: 25.5 % — ABNORMAL LOW (ref 39.0–52.0)
MCH: 31.7 pg (ref 26.0–34.0)
MCHC: 34.7 g/dL (ref 30.0–36.0)
MCHC: 34.5 g/dL (ref 30.0–36.0)
MCV: 91.2 fL (ref 78.0–100.0)
Platelets: 148 10*3/uL — ABNORMAL LOW (ref 150–400)
Platelets: 172 10*3/uL (ref 150–400)
RBC: 2.62 MIL/uL — ABNORMAL LOW (ref 4.22–5.81)
RDW: 14.8 % (ref 11.5–15.5)
RDW: 14.8 % (ref 11.5–15.5)
WBC: 11.7 10*3/uL — ABNORMAL HIGH (ref 4.0–10.5)

## 2012-06-02 LAB — TSH: TSH: 1.678 u[IU]/mL (ref 0.350–4.500)

## 2012-06-02 LAB — BASIC METABOLIC PANEL
CO2: 29 mEq/L (ref 19–32)
Calcium: 8.1 mg/dL — ABNORMAL LOW (ref 8.4–10.5)
Creatinine, Ser: 0.73 mg/dL (ref 0.50–1.35)
GFR calc Af Amer: >90 mL/min (ref >90)
GFR calc non Af Amer: >90 mL/min (ref >90)
Sodium: 137 mEq/L (ref 135–145)

## 2012-06-02 LAB — MAGNESIUM: Magnesium: 1.8 mg/dL (ref 1.5–2.5)

## 2012-06-02 MED ORDER — OXYCODONE-ACETAMINOPHEN 5-325 MG PO TABS
1.0000 | ORAL_TABLET | Freq: Four times a day (QID) | ORAL | Status: DC | PRN
  Administered 2012-06-02 – 2012-06-03 (×4): 2 via ORAL
  Filled 2012-06-02 (×4): qty 2

## 2012-06-02 NOTE — Care Management Note (Signed)
    Page 1 of 2   06/03/2012     5:00:41 PM   CARE MANAGEMENT NOTE 06/03/2012  Patient:  Keith [name],Keith [name]   Account Number:  [id number]  Date Initiated:  06/02/2012  Documentation initiated by:  [facility]  Subjective/Objective Assessment:   Readmitted with bleeding from new trach - causing resp arrest briefly.  Daughter and son stay with him 24/7 since last admission.     Action/Plan:   Anticipated DC Date:  06/03/2012   Anticipated DC Plan:  HOME W HOME HEALTH SERVICES      DC Planning Services  CM consult      [facility] Choice  HOME HEALTH  Resumption Of Svcs/PTA Provider   Choice offered to / List presented to:  C-1 Patient   DME arranged  TRACH SUPPLIES  SUCTION      DME agency  [facility]     HH arranged  HH-1 RN  HH-2 PT  HH-3 OT  HH-4 NURSE'S AIDE  HH-6 SOCIAL WORKER      HH agency  [facility]   Status of service:  Completed, signed off Medicare Important Message given?   (If response is "NO", the following Medicare IM given date fields will be blank) Date Medicare IM given:   Date Additional Medicare IM given:    Discharge Disposition:  HOME W HOME HEALTH SERVICES  Per UR Regulation:  Reviewed for med. necessity/level of care/duration of stay  If discussed at Long Length of Stay Meetings, dates discussed:    Comments:  Contact:  Burnett,Tracy Daughter [phone]  06/03/12 Fumiko Cham,RN,BSN [phone] PER DAUGHTER, FAMILY AND PT HAD NO TRACH TEACHINGPRIOR TO DC LAST ADMISSION; ALSO HAD NO TRACH SUPPLY OR SUCTION DELIVERY ARRANGED.Marland Kitchen  TRACH TEACHING DONE TODAY BY RN AND RESPIRATORY THERAPY.  ARRANGED SUCTION SET UP, TRACH SUPPLIES, SUCTION CATHETERS WITH [facility] TO BE DELIVERED THIS EVENING AFTER ARRIVING HOME.  PER DAUGHTER REQUEST, PT TRANSPORTED HOME VIA AMBULANCE ([facility]).  THEY ARE AWARE THAT THEY MAY BE CHARGED FOR THIS.  [facility] AWARE OF DC HOME TODAY, AND PLAN TO SEE PT TOMORROW.  PT ON ROOM AIR; NO O2 OR  HUMIDIFICATION REQUIRED.  DAUGHTER APPRECIATIVE OF ARRANGEMENTS MADE TODAY.  06-02-12 2:24pm Avie Arenas, RNBSN [phone] spoke with patient, daughter and son in room.  Son and daughter take turns being with dad 24/7. however after last incident concerned about caring for him with no medical experiene.  Dad is adament about going only home with family to care for him but is agreeable to having [name] care giver with them 24/7 or whatever hours family wants. Understand this will be [name] self pay as insurance only covers assistance from [facility].  List given to them.  Has been set up with [facility] from last admission but they never got out to start services.  Orders placed for resumption of services for [facility] RN, PT/OT, NA and SW.

## 2012-06-02 NOTE — Progress Notes (Signed)
  Echocardiogram 2D Echocardiogram has been performed.  Jorje Guild 06/02/2012, 12:04 PM

## 2012-06-02 NOTE — Progress Notes (Signed)
PULMONARY  / CRITICAL CARE MEDICINE  Name: Keith [name] MRN: [id number] DOB: 04/23/1940    ADMISSION DATE:  05/31/2012  REFERRING MD :  EDP  CHIEF COMPLAINT:  Bleeding  BRIEF PATIENT DESCRIPTION:  73 yo male with angioedema from ACE inhibitor s/p tracheostomy and d/c home 4/11.  Returned to ED 4/12 with tracheal bleeding, respiratory/cardiac arrest with 5 minutes CPR.    SIGNIFICANT EVENTS / STUDIES:  4/12 Respiratory/cardiac arrest from tracheal bleeding.  Taken to OR by ENT.  Rib/sternal fracture from CPR.  LINES / TUBES: Trach >>   SUBJECTIVE:  Tolerating trach collar. No bleeding  VITAL SIGNS: Temp:  [98 F (36.7 C)-98.2 F (36.8 C)] 98 F (36.7 C) (04/14 0429) Pulse Rate:  [67-117] 78 (04/14 0645) Resp:  [10-20] 14 (04/14 0645) BP: (91-127)/(48-79) 100/59 mmHg (04/14 0600) SpO2:  [95 %-100 %] 99 % (04/14 0645) FiO2 (%):  [40 %] 40 % (04/14 0325) Weight:  [196 lb 6.9 oz (89.1 kg)] 196 lb 6.9 oz (89.1 kg) (04/14 0500) HEMODYNAMICS:   VENTILATOR SETTINGS: Vent Mode:  [-]  FiO2 (%):  [40 %] 40 % INTAKE / OUTPUT: Intake/Output     04/13 0701 - 04/14 0700 04/14 0701 - 04/15 0700   P.O. 840    I.V. (mL/kg) 1225 (13.7)    IV Piggyback 700    Total Intake(mL/kg) 2765 (31)    Urine (mL/kg/hr) 1850 (0.9)    Total Output 1850     Net +915            PHYSICAL EXAMINATION: General:  No distress Neuro:  Awake and alert. HEENT:  Trach in place Cardiovascular:  s1s2 irregular. Lungs:  Scattered rhonchi Abdomen: soft, non tender Musculoskeletal: no edema Skin:  No rashes  LABS:  Recent Labs Lab 05/27/12 0451  05/29/12 0545 05/31/12 0553 05/31/12 0605 05/31/12 0619 05/31/12 1328 05/31/12 1952 06/01/12 0535 06/02/12 0500  HGB 9.5*  < >  --  10.4* 11.6*  --  9.0* 8.8* 9.6* 8.3*  WBC 8.8  < >  --  18.3*  --   --  11.0*  --  10.0 10.0  PLT 144*  < >  --  228  --   --  179  --  177 148*  NA 141  --  137 140 142  --   --   --  141 137  K 3.6  --  3.3*  3.2* 3.2*  --   --   --  3.4* 3.5  CL 105  --  100 101 102  --   --   --  106 103  CO2 28  --  31 25  --   --   --   --  29 29  GLUCOSE 101*  --  134* 183* 186*  --   --   --  110* 115*  BUN 30*  --  16 18 19   --   --   --  21 16  CREATININE 0.76  --  0.84 0.74 0.70  --   --   --  0.77 0.73  CALCIUM 8.8  --  8.4 9.0  --   --   --   --  8.2* 8.1*  MG 2.4  --  1.9  --   --   --   --   --   --  1.8  PHOS 3.2  --  3.6  --   --   --   --   --   --   --  AST  --   --   --  48*  --   --   --   --   --   --   ALT  --   --   --  49  --   --   --   --   --   --   ALKPHOS  --   --   --  81  --   --   --   --   --   --   BILITOT  --   --   --  0.6  --   --   --   --   --   --   PROT  --   --   --  7.8  --   --   --   --   --   --   ALBUMIN  --   --   --  3.2*  --   --   --   --   --   --   INR  --   --  1.37 1.29  --   --   --   --   --   --   LATICACIDVEN  --   --   --   --  5.67*  --   --   --   --   --   PHART  --   --   --   --   --  7.310*  --   --   --   --   PCO2ART  --   --   --   --   --  52.5*  --   --   --   --   PO2ART  --   --   --   --   --  147.0*  --   --   --   --   < > = values in this interval not displayed.  Recent Labs Lab 05/27/12 2330 05/28/12 0338 05/28/12 0737 05/28/12 1131 05/31/12 1012  GLUCAP 113* 137* 117* 123* 126*    Imaging: Dg Chest Port 1 View  06/01/2012  *RADIOLOGY REPORT*  Clinical Data: Follow up atelectasis.  Chronic ventilator dependent respiratory failure.  Recent cardiopulmonary resuscitation.  PORTABLE CHEST - 1 VIEW 06/01/2012 0614 hours:  Comparison: CTA chest yesterday.  Portable chest x-rays yesterday, 05/23/2012, 05/22/2012.  Findings: Tracheostomy tube tip below the thoracic inlet.  Cardiac silhouette enlarged but stable.  Suboptimal inspiration with mild bibasilar atelectasis, with improved aeration since yesterday. Pulmonary vascularity normal.  No new pulmonary parenchymal abnormalities.  IMPRESSION: Suboptimal inspiration accounts for  bibasilar atelectasis, though aeration has improved since yesterday.  No new abnormalities.   Original Report Authenticated By: Hulan Saas, M.D.      ASSESSMENT / PLAN:  PULMONARY A: Angioedema 2nd to ACE inhibitor s/p trach. Tracheal bleeding 4/12. Acute respiratory failure. Rib/sternal fracture after CPR. P:   Tolerating trach collar this am. Tolerating it well. Likely D/C home in 24 hrs PMV, cuff down 4 Swallows d3 Decannulation per ENT, appears strong candidate  CARDIOVASCULAR A: Hypotension. Afib. Hx of HTN, hyperlipidemia. P:  - Bolus given. Maintain MAP>65. - No anticoagulation for Afib- considering recent bleeding. - Cardiology evaluation now to guide coumadin needs, have some concerns with bleeding and reversibility with lovenox bridge if chosen - Will consider starting anticoagulation after cards eval - Continue Lopressor for rate control. - ensure TSH, echo  RENAL A:  Hypokalemia. Resolved P:  F/u BMET kvo  GASTROINTESTINAL A:  Nutrition. P:   Advance diet per swallow eval d3  HEMATOLOGIC A:  Tracheal bleeding resolved P:  SCD for DVT prevention F/u CBC. Hb stable 8.3<9.6<9.0 Repeat cbc now, had some drop  INFECTIOUS A:  No evidence for infection. P:   Monitor clinically  ENDOCRINE A:  No hx of DM.   P:   Monitor CBG on BMET  NEUROLOGIC A:  Pain control. Hx of ETOH. Pain with cough, secondary to cpr  P:   PRN  Fentanyl for pain. D/C versed. Add oral pain management, avoid splinting  PATEL,RAVI, MD 8:21 AM   To tele, call cards I have fully examined this patient and agree with above findings.     Updated daughter at bedside.  If stable, then possible d/c home in 24 to 48 hrs.  Mcarthur Rossetti. Tyson Alias, MD, FACP Pgr: [phone] [place] [facility]

## 2012-06-02 NOTE — Evaluation (Signed)
Physical Therapy Evaluation Patient Details Name: Keith [name] MRN: [id number] DOB: 27-Dec-1940 Today's Date: 06/02/2012 Time: 4098-1191 PT Time Calculation (min): 30 min  PT Assessment / Plan / Recommendation Clinical Impression  pt readmitted after recent discharge with more bleeding and a new plug of the trach with brief cardiac arrest.  On eval, pt has significant balance deficitis and is a risk to fall.  Pt can benefit from PT to maximize safety and Independence.    PT Assessment  Patient needs continued PT services    Follow Up Recommendations  Home health PT;Supervision for mobility/OOB    Does the patient have the potential to tolerate intense rehabilitation      Barriers to Discharge        Equipment Recommendations  None recommended by PT    Recommendations for Other Services     Frequency Min 3X/week    Precautions / Restrictions Precautions Precautions: Fall Precaution Comments: trach collar, panda Restrictions Weight Bearing Restrictions: No   Pertinent Vitals/Pain sats on RA with exertion 95-97%, HR 80's      Mobility  Bed Mobility Bed Mobility: Sit to Supine;Supine to Sit;Sitting - Scoot to Edge of Bed Supine to Sit: 6: Modified independent (Device/Increase time);HOB flat Sitting - Scoot to Edge of Bed: 6: Modified independent (Device/Increase time) Sit to Supine: 6: Modified independent (Device/Increase time) Details for Bed Mobility Assistance: safe mobility Transfers Transfers: Sit to Stand;Stand to Sit Sit to Stand: 5: Supervision;With upper extremity assist;From bed Stand to Sit: 5: Supervision;With upper extremity assist;To bed Details for Transfer Assistance: needed the bed to steady himself upon initially standing up Ambulation/Gait Ambulation/Gait Assistance: 4: Min assist Ambulation Distance (Feet): 380 Feet Assistive device: 1 person hand held assist (IV pole) Ambulation/Gait Assistance Details: noticeably more unsteady when using  no assistive devide than when pushing the pole.  Scanning either direction produce instability, but to the Left patient lost balance to R and unable to recover. Gait Pattern: Step-through pattern;Scissoring;Narrow base of support Gait velocity: decreased Stairs: No Wheelchair Mobility Wheelchair Mobility: No    Exercises     PT Diagnosis: Abnormality of gait  PT Problem List: Decreased activity tolerance;Decreased balance;Decreased mobility;Decreased knowledge of use of DME PT Treatment Interventions: DME instruction;Gait training;Stair training;Functional mobility training;Therapeutic activities;Patient/family education   PT Goals Acute Rehab PT Goals PT Goal Formulation: With patient Time For Goal Achievement: 06/16/12 Potential to Achieve Goals: Good Pt will go Sit to Stand: with modified independence PT Goal: Sit to Stand - Progress: Goal set today Pt will go Stand to Sit: with modified independence PT Goal: Stand to Sit - Progress: Goal set today Pt will Stand: with modified independence;with unilateral upper extremity support PT Goal: Stand - Progress: Goal set today Pt will Ambulate: >150 feet;with modified independence;with least restrictive assistive device PT Goal: Ambulate - Progress: Goal set today  Visit Information  Last PT Received On: 06/02/12    Subjective Data  Subjective: This latest episode happened the other day about 4am Patient Stated Goal: To get home   Prior Functioning  Home Living Lives With: Alone Available Help at Discharge: Available PRN/intermittently;Family Type of Home: House Home Access: Stairs to enter [employer] of Steps: 1 Home Layout: One level Bathroom Shower/Tub: Tub/shower unit;Walk-in shower;Curtain Bathroom Toilet: Standard Home Adaptive Equipment: Walker - rolling Prior Function Level of Independence: Independent Able to Take Stairs?: Yes Driving: Yes Vocation: Retired Comments: worked as Cabin crew x 15 yrs, 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, materials;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    Cognition  Cognition Overall Cognitive Status: Appears within functional limits for tasks assessed/performed Arousal/Alertness: Awake/alert Orientation Level: Appears intact for tasks assessed Behavior During Session: [facility] for tasks performed    Extremity/Trunk Assessment Right Lower Extremity Assessment RLE ROM/Strength/Tone: Within functional levels Left Lower Extremity Assessment LLE ROM/Strength/Tone: Within functional levels Trunk Assessment Trunk Assessment: Normal   Balance Balance Balance Assessed: Yes Static Sitting Balance Static Sitting - Level of Assistance: 7: Independent Static Standing Balance Static Standing - Balance Support: During functional activity;No upper extremity supported Static Standing - Level of Assistance: 5: Stand by assistance  End of Session PT - End of Session Activity Tolerance: Patient tolerated treatment well Patient left: in chair;with family/visitor present Nurse Communication: Mobility status  GP     Damauri Minion, Eliseo Gum 06/02/2012, 5:46 PM 06/02/2012  Carter Bing, PT [phone] (pager)

## 2012-06-02 NOTE — Consult Note (Signed)
CARDIOLOGY CONSULT NOTE  Patient ID: Keith [name] MRN: [id number], DOB/AGE: 05/07/1940   Admit date: 05/31/2012 Date of Consult: 06/02/2012  Primary Physician: Larina Bras, MD in [place], [place] Primary Cardiologist: New to [facility] Reason for Consultation: Atrial fibrillation  History of Present Illness Keith [name] is a 72 year old man with HTN, dyslipidemia and alcohol abuse who was recently discharged after an episode of angioedema requiring emergent tracheostomy. He presented 2 days ago with bleeding and total occlusion of his tracheostomy that resulted in respiratory/bradycardic arrest requiring CPR. According to his chart, he had ROSC and regained consciousness after 2 minutes of CPR. He was taken to the OR by ENT for management of tracheostomy. On admission, his 12-lead ECG revealed rapid AF; therefore, we have been consulted for further cardiac recommendations.  Keith [name] reports intermittent "racing" palpitations at least for 6 months. He states his symptoms occur with exertion usually and resolve with rest. He denies CP or SOB. He denies dizziness, near syncope or syncope. He denies LE swelling, orthopnea or PND. He is quite active and feels his functional status is stable. He denies any history of CAD/MI, valvular heart disease or CHF. He denies thyroid dysfunction. He denies CVA/TIA. He reports drinking 1 pint per daily for many years but denies illicit drug use. His echo is pending.    Past Medical History Past Medical History  Diagnosis Date  . HTN (hypertension)   . Hypercholesterolemia     Past Surgical History Past Surgical History  Procedure Laterality Date  . Tracheostomy  05/22/12    emergent, angioedema    Allergies/Intolerances Allergies  Allergen Reactions  . Lisinopril Other (See Comments)    Presented with laryngeal edema - likely caused is lisinopril     Inpatient Medications . antiseptic oral rinse  15 mL Mouth Rinse BID  . atorvastatin  40 mg Oral  q1800  . famotidine (PEPCID) IV  20 mg Intravenous Q12H  . fluticasone  2 spray Each Nare Daily  . folic acid  1 mg Oral Daily  . metoprolol tartrate  25 mg Oral BID  . thiamine  100 mg Oral Daily   . dextrose 5 % and 0.45 % NaCl with KCl 20 mEq/L 50 mL/hr at 06/02/12 1000   Family History Positive for CAD   Social History Social History  . Marital Status: Single   Social History Main Topics  . Smoking status: Never Smoker   . Smokeless tobacco: Not on file  . Alcohol Use: 7.0 oz/week    14 drink(s) per week     Comment: drinks daily  . Drug Use: No   Review of Systems General: No chills, fever, night sweats or weight changes  Cardiovascular:  No chest pain, dyspnea on exertion, edema, orthopnea, palpitations, paroxysmal nocturnal dyspnea Dermatological: No rash, lesions or masses Respiratory: No cough, dyspnea Urologic: No hematuria, dysuria Abdominal: No nausea, vomiting, diarrhea, bright red blood per rectum, melena, or hematemesis Neurologic: No visual changes, weakness, changes in mental status All other systems reviewed and are otherwise negative except as noted above.  Physical Exam Blood pressure 113/45, pulse 77, temperature 97 F (36.1 C), temperature source Oral, resp. rate 16, height 5\' 8"  (1.727 m), weight 196 lb 6.9 oz (89.1 kg), SpO2 98.00%.  General: Well developed 72 year old male male in no acute distress. HEENT: Normocephalic, atraumatic. EOMs intact. Sclera nonicteric.  Neck: Supple. Trach collar in place. Lungs: Respirations regular and unlabored, CTA bilaterally. No wheezes, rales or rhonchi. Heart: RRR. S1,  S2 present. No murmurs, rub, S3 or S4. Abdomen: Soft, non-tender, non-distended. BS present x 4 quadrants. No hepatosplenomegaly.  Extremities: No clubbing, cyanosis or edema. DP/PT/Radials 2+ and equal bilaterally. Psych: Normal affect. Neuro: Alert and oriented X 3. Moves all extremities spontaneously. Musculoskeletal: No kyphosis. Skin: Intact.  Warm and dry. No rashes or petechiae in exposed areas.   Labs No results found for this basename: CKTOTAL, CKMB, TROPONINI,  in the last 72 hours Lab Results  Component Value Date   WBC 10.0 06/02/2012   HGB 8.3* 06/02/2012   HCT 23.9* 06/02/2012   MCV 91.2 06/02/2012   PLT 148* 06/02/2012    Recent Labs Lab 05/31/12 0553  06/02/12 0500  NA 140  < > 137  K 3.2*  < > 3.5  CL 101  < > 103  CO2 25  < > 29  BUN 18  < > 16  CREATININE 0.74  < > 0.73  CALCIUM 9.0  < > 8.1*  PROT 7.8  --   --   BILITOT 0.6  --   --   ALKPHOS 81  --   --   ALT 49  --   --   AST 67*  --   --   GLUCOSE 183*  < > 115*  < > = values in this interval not displayed.  No results found for this basename: TSH, T4TOTAL, FREET3, T3FREE, THYROIDAB,  in the last 72 hours   Recent Labs  05/31/12 0553  INR 1.29    Radiology/Studies Ct Angio Chest Pe W/cm &/or Wo Cm   05/31/2012  IMPRESSION: The examination is limited due to excessive patient motion.  There is no evidence to suggest a large or central pulmonary embolism.  Fracture of the sternum and right anterior rib fractures.  Findings are most likely related to recent cardiopulmonary resuscitation.  Bilateral atelectasis in the lungs.  Small amount of perihepatic ascites of unknown etiology.   Original Report Authenticated By: Richarda Overlie, M.D.    Dg Chest Port 1 View   06/01/2012    IMPRESSION: Suboptimal inspiration accounts for bibasilar atelectasis, though aeration has improved since yesterday.  No new abnormalities.   Original Report Authenticated By: Hulan Saas, M.D.     12-lead ECG shows an irregular, narrow complex tachycardia - rapid atrial fibrillation 138 bpm Telemetry currently shows SR   Assessment and Plan 1. Atrial fibrillation, newly diagnosed 2. Anemia with recent tracheostomy site/wound hemorrhage 3. Alcohol abuse 4. HTN  Keith [name] has newly diagnosed atrial fibrillation. He is now back in SR. This may be related to his acute  event. However, given his symptoms of intermittent palpitations, it would be prudent to have him wear an event monitor to determine if he has more AF. We agree with ordering an echocardiogram to assess his heart structure and function. Will order thyroid studies for completeness.   If he has recurrent AF, his CHADS2-VASc score is at least 2 so will need chronic anticoagulation for embolic prophylaxis. However, he is currently anemic with recent hemorrhage at his trach site. Another concern regarding anticoagulation with him is his alcohol abuse which would increase his bleeding risk. He was counseled regarding the importance of abstaining from alcohol consumption. For now, will not anticoagulate unless he develops recurrent AF.   Dr. Elease Hashimoto to see Signed, Rick Duff, PA-C 06/02/2012, 1:40 PM  Attending Note:   The patient was seen and examined.  Agree with assessment and plan as noted above.  Changes made  to the above note as needed.  Keith [name] has not had any previous episodes of Atrial fib but has had some palpitations in the past.  He has a hx of heavy ETOH use and is at risk for having A-fib.   His echo reveals normal LV systolic function.   He is currently back in NSR. Lungs are clear Trach in Cor:  RR  I suspect this recent episode of AF was due to the stress of his respiratory arrest which was due to bleeding from his trach.  Given this recent bleed ( with associated complications, I do not think we should start anticoagulation now but should wait until he is over this acute event.  At this point, I think the risk of re-bleeding is higher than the risk of an embolic CVA.   We will place a 30 day event monitor on him and will see him in the next 4-6 weeks.  He can see our NP (LoriGerhardt) Sooner if needed.    Vesta Mixer, Montez Hageman., MD, [facility] 06/02/2012, 4:20 PM

## 2012-06-02 NOTE — Progress Notes (Signed)
Received from 2100.  Placed on telemetry in room 2039. Assessed per flow sheet; no changes noted. Trach in place with pessimier valve in place; on 40% trach collar. Denies any pain or distress at this time.  Call bell near. Keith Levers

## 2012-06-02 NOTE — Progress Notes (Signed)
Trach Team orders received, but no SLP orders.   MD note reports pt tolerating PMSV well. Pt had a mild dysphagia seen on MBS during previous admission with silent penetration of thin liquids. Pt instructed to use chin tuck and f/u with Home Health SLP. If pt to d/c soon, inpatient orders not needed, but please ensure pt has home health SLP orders.   Harlon Ditty, MA CCC-SLP [phone]

## 2012-06-02 NOTE — Progress Notes (Signed)
Report called to [facility], RN on Unit 2000 and patient transferred uneventfully.  Patient handoff at bedside to [place], RN and [place], [place].   Baird Lyons

## 2012-06-02 NOTE — Progress Notes (Signed)
[facility] ADULT ICU REPLACEMENT PROTOCOL FOR AM LAB REPLACEMENT ONLY  The patient does not apply for the [facility] Adult ICU Electrolyte Replacment Protocol based on the criteria listed below:   1. Is GFR >/= 40 ml/min? yes  Patient's GFR today is >90 2. Is urine output >/= 0.5 ml/kg/hr for the last 6 hours? no Patient's UOP is 0 ml/kg/hr 3. Is BUN < 60 mg/dL? yes  Patient's BUN today is 16 4. Abnormal electrolyte(s):K+3.5 5. Ordered repletion with: none 6. If a panic level lab has been reported, has the CCM MD in charge been notified? yes.   Physician:  Dr Juel Burrow, Einar Gip 06/02/2012 5:52 AM

## 2012-06-03 ENCOUNTER — Encounter (HOSPITAL_COMMUNITY): Payer: Self-pay | Admitting: Otolaryngology

## 2012-06-03 MED ORDER — LEVOFLOXACIN 500 MG PO TABS: 500.0000 mg | ORAL_TABLET | Freq: Every day | ORAL | Status: DC

## 2012-06-03 MED ORDER — EPINEPHRINE 0.3 MG/0.3ML IJ DEVI: 0.3000 mg | Freq: Once | INTRAMUSCULAR | Status: DC

## 2012-06-03 MED ORDER — TRAMADOL HCL 50 MG PO TABS: 50.0000 mg | ORAL_TABLET | Freq: Four times a day (QID) | ORAL | Status: DC | PRN

## 2012-06-03 MED ORDER — METOPROLOL TARTRATE 25 MG PO TABS: 25.0000 mg | ORAL_TABLET | Freq: Two times a day (BID) | ORAL | Status: DC

## 2012-06-03 NOTE — Progress Notes (Signed)
Respiratory team provided trach care instructions to patient and daughter, who is at bedside. All questions answered.  Will continue to monitor.

## 2012-06-03 NOTE — Clinical Social Work Psychosocial (Signed)
     Clinical Social Work Department BRIEF PSYCHOSOCIAL ASSESSMENT 06/03/2012  Patient:  Keith [name],Keith [name]     Account Number:  [id number]     Admit date:  05/31/2012  Clinical Social Worker:  Margaree Mackintosh  Date/Time:  06/03/2012 10:34 AM  Referred by:  Physician  Date Referred:  06/03/2012 Referred for  Substance Abuse   Other Referral:   Interview type:  Patient Other interview type:   With dtr present.    PSYCHOSOCIAL DATA Living Status:  ALONE Admitted from facility:   Level of care:   Primary support name:  Keith [name]: [phone] Primary support relationship to patient:  CHILD, ADULT Degree of support available:   Adequate.    CURRENT CONCERNS Current Concerns  Substance Abuse   Other Concerns:    SOCIAL WORK ASSESSMENT / PLAN Clinical Social Worker received referral indicating pt expresses interest in substance abuse resources.  CSW reviewed chart and met with pt and pt's dtr at bedside. CSW introduced self, explained role, and provided support. CSW provided active listening.  Pt endorsed his goal to cease consumption of alcohol and acknowledged the difficulty of this after drinking for 20 years.  CSW and pt reviewed support system; pt identified dtr as [name] support system.  Current plan is for pt to dc home with dtr.  CSW provided list of substance abuse resources.  CSW encouraged pt and dtr to phone resources; dtr and pt stated understanding.  CSW encouraged pt and dtr to inform staff should any additional questions arise.  CSW to sign off at this time.  Please re consult if needed.   Assessment/plan status:  Information/Referral to [facility] Other assessment/ plan:   Information/referral to community resources:   Substance Abuse.    PATIENTS/FAMILYS RESPONSE TO PLAN OF CARE: Pt and dtr thanked CSW for intervention.

## 2012-06-03 NOTE — Progress Notes (Addendum)
O2 removed per MD order. O2 sats at this time while patient is sleeping is 92 %. Continuous pulse ox connected to patient.  Will continue to monitor.

## 2012-06-03 NOTE — Discharge Summary (Signed)
Physician Discharge Summary  Patient ID: Keith [name] MRN: [id number] DOB/AGE: 1940/05/10 72 y.o.  Admit date: 05/31/2012 Discharge date: 06/03/2012  Problem List Active Problems:   Laryngeal edema   Hypertension   Hyperlipidemia   Habitual alcohol use   Cardiac arrest   Bleeding   Tracheostomy complication   Fracture of multiple ribs   Sternal fracture   Acute respiratory failure  HPI: 72 years old male with PMH relevant for HTN, dyslipidemia and alcohol abuse. Recently discharged after an episode of angioedema likely secondary to ACE inhibitors. He required emergent tracheostomy. Apparently his tracheostomy was downsized today. Presents with bleeding and total occlusion of tracheostomy that resulted in cardiac arrest requiring CPR. He returned to spontaneous circulation and regained consciousness after 2 minutes of CPR. At the time of my examination the patient is awake, alert, oriented and hemodynamically stable. He complains of chest wall pain.   Hospital Course: SIGNIFICANT EVENTS / STUDIES:  4/12 Respiratory/cardiac arrest from tracheal bleeding. Taken to OR by ENT. Rib/sternal fracture from CPR.  LINES / TUBES:  Trach >>  ASSESSMENT / PLAN:  PULMONARY  A: Angioedema 2nd to ACE inhibitor s/p trach.  Tracheal bleeding 4/12.  Acute respiratory failure.  Rib/sternal fracture after CPR.  P:  Tolerating trach collar this am. Tolerating it well.  Likely D/C home in 24 hrs  PMV, cuff down 4  Swallows d3  Decannulation per ENT, appears strong candidate  CARDIOVASCULAR  A: Hypotension. Afib. Hx of HTN, hyperlipidemia.  P:  - Bolus given. Maintain MAP>65.  - No anticoagulation for Afib- considering recent bleeding.  - Cardiology evaluation , no anticoagulation. Follow up with cards for monitor. - Continue Lopressor for rate control.  -follow up with heart rate monitor.  RENAL  A: Hypokalemia. Resolved  P:  F/u BMET  kvo  GASTROINTESTINAL  A: Nutrition.  P:   Advance diet per swallow eval d3  HEMATOLOGIC  A: Tracheal bleeding resolved  P:  SCD for DVT prevention    INFECTIOUS  A: Tracheobronchitis P:  5 days of Levaquin. ENDOCRINE  A: No hx of DM.  P:  Monitor CBG on BMET  NEUROLOGIC  A: Pain control.  Hx of ETOH.  Pain with cough, secondary to cpr  P:  Ultram prn   Labs at discharge Lab Results  Component Value Date   CREATININE 0.73 06/02/2012   BUN 16 06/02/2012   NA 137 06/02/2012   K 3.5 06/02/2012   CL 103 06/02/2012   CO2 29 06/02/2012   Lab Results  Component Value Date   WBC 11.7* 06/02/2012   HGB 8.8* 06/02/2012   HCT 25.5* 06/02/2012   MCV 91.7 06/02/2012   PLT 172 06/02/2012   Lab Results  Component Value Date   ALT 49 05/31/2012   AST 67* 05/31/2012   ALKPHOS 81 05/31/2012   BILITOT 0.6 05/31/2012   Lab Results  Component Value Date   INR 1.29 05/31/2012   INR 1.37 05/29/2012   INR 1.30 05/23/2012    Current radiology studies No results found.  Disposition:  01-Home or Self Care   Future Appointments Provider Department Dept Phone   06/05/2012 3:00 PM [facility] [employer] Main Office [place]) [phone]   07/15/2012 9:45 AM Vesta Mixer, MD [facility] Main Office [place]) [phone]       Medication List    STOP taking these medications       chlordiazePOXIDE 25 MG capsule  Commonly known as:  LIBRIUM  dexamethasone 0.75 MG tablet  Commonly known as:  DECADRON      TAKE these medications       amLODipine 10 MG tablet  Commonly known as:  NORVASC  Take 1 tablet (10 mg total) by mouth daily.     atorvastatin 40 MG tablet  Commonly known as:  LIPITOR  Take 40 mg by mouth every morning.     folic acid 1 MG tablet  Commonly known as:  FOLVITE  Take 1 tablet (1 mg total) by mouth daily.     ipratropium 0.06 % nasal spray  Commonly known as:  ATROVENT  Place 2 sprays into the nose 4 (four) times daily.     levofloxacin 500 MG tablet  Commonly known as:   LEVAQUIN  Take 1 tablet (500 mg total) by mouth daily.     metoprolol tartrate 25 MG tablet  Commonly known as:  LOPRESSOR  Take 1 tablet (25 mg total) by mouth 2 (two) times daily.     mometasone 50 MCG/ACT nasal spray  Commonly known as:  NASONEX  Place 2 sprays into the nose daily.     thiamine 100 MG tablet  Take 1 tablet (100 mg total) by mouth daily.           Follow-up Information   Follow up with [name], MD. Schedule an appointment as soon as possible for a visit on 06/09/2012. (2:30 pm)    Contact information:   [address] [place] [address] [phone]       Follow up with [facility] Nurse On 06/05/2012. (At 3:00 PM to pick up heart monitor)    Contact information:   [facility] [address] [place] [address] [phone]      Follow up with Elyn Aquas., MD On 07/15/2012. (At 9:45 AM)    Contact information:   [facility] [address] [place] [address] [phone]      Follow up with Marlou Starks, MD On 06/05/2012. (10:30 am)    Contact information:   [address]  [address]  [address] [place] [address]        Discharged Condition: fair  Time spent on discharge greater than 40 minutes.  Vital signs at Discharge. Temp:  [97 F (36.1 C)-98.5 F (36.9 C)] 98.4 F (36.9 C) (04/15 0437) Pulse Rate:  [62-88] 67 (04/15 0832) Resp:  [11-22] 20 (04/15 0832) BP: (103-133)/(45-74) 103/58 mmHg (04/15 0437) SpO2:  [96 %-100 %] 98 % (04/15 0832) FiO2 (%):  [40 %-50 %] 40 % (04/15 0832) Weight:  [93.713 kg (206 lb 9.6 oz)] 93.713 kg (206 lb 9.6 oz) (04/15 0500) Office follow up Special Information or instructions. He will follow up with [facility] Cardiology and with ENT Dr. Emeline Darling.  Signed: Brett Canales Minor ACNP [facility] Pager [phone] till 3 pm If no answer page [phone] 06/03/2012, 8:59 AM   Levy Pupa, MD, PhD 06/03/2012, 2:20 PM [place]  [facility] [phone] or if no answer [phone]

## 2012-06-03 NOTE — Progress Notes (Signed)
SUBJECTIVE: The patient is doing well today.  At this time, he denies chest pain, shortness of breath, or any new concerns.  Marland Kitchen antiseptic oral rinse  15 mL Mouth Rinse BID  . atorvastatin  40 mg Oral q1800  . famotidine (PEPCID) IV  20 mg Intravenous Q12H  . fluticasone  2 spray Each Nare Daily  . folic acid  1 mg Oral Daily  . metoprolol tartrate  25 mg Oral BID  . thiamine  100 mg Oral Daily   . dextrose 5 % and 0.45 % NaCl with KCl 20 mEq/L 50 mL/hr at 06/03/12 0542    OBJECTIVE: Physical Exam: Filed Vitals:   06/02/12 2301 06/03/12 0316 06/03/12 0437 06/03/12 0500  BP:   103/58   Pulse: 68 62 65   Temp:   98.4 F (36.9 C)   TempSrc:   Oral   Resp: 20 16 18    Height:      Weight:    206 lb 9.6 oz (93.713 kg)  SpO2: 100% 96% 100%     Intake/Output Summary (Last 24 hours) at 06/03/12 0753 Last data filed at 06/02/12 1000  Gross per 24 hour  Intake    510 ml  Output     50 ml  Net    460 ml    Telemetry reveals sinus rhythm  GEN- The patient is well appearing, alert and oriented x 3 today.   Head- normocephalic, atraumatic Eyes-  Sclera clear, conjunctiva pink Ears- hearing intact Oropharynx- clear Neck- tach with thick yellow/ grean secretions Lymph- no cervical lymphadenopathy Lungs- Clear to ausculation bilaterally, normal work of breathing Heart- Regular rate and rhythm, no murmurs, rubs or gallops, PMI not laterally displaced GI- soft, NT, ND, + BS Extremities- no clubbing, cyanosis, or edema Skin- no rash or lesion Psych- euthymic mood, full affect Neuro- strength and sensation are intact  LABS: Basic Metabolic Panel:  Recent Labs  16/10/96 0535 06/02/12 0500  NA 141 137  K 3.4* 3.5  CL 106 103  CO2 29 29  GLUCOSE 110* 115*  BUN 21 16  CREATININE 0.77 0.73  CALCIUM 8.2* 8.1*  MG  --  1.8   Liver Function Tests: No results found for this basename: AST, ALT, ALKPHOS, BILITOT, PROT, ALBUMIN,  in the last 72 hours No results found for  this basename: LIPASE, AMYLASE,  in the last 72 hours CBC:  Recent Labs  06/02/12 0500 06/02/12 1347  WBC 10.0 11.7*  HGB 8.3* 8.8*  HCT 23.9* 25.5*  MCV 91.2 91.7  PLT 148* 172   Cardiac Enzymes: Thyroid Function Tests:  Recent Labs  06/02/12 1347  TSH 1.678    ASSESSMENT AND PLAN:  Active Problems:   Laryngeal edema   Hypertension   Hyperlipidemia   Habitual alcohol use   Cardiac arrest   Bleeding   Tracheostomy complication   Fracture of multiple ribs   Sternal fracture   Acute respiratory failure  1. afib Occurred in the setting of acute medical illness/ respiratory arrest.  As this is a reversible cause and no further afib has been seen, I would not recommend anticoagulation at this point.  It may be reasonable to place a 30 day monitor eventually (once his present medical illness is resolved and he has returned to his baseline) to assess for asymptomatic afib.  This can be ordered upon follow-up with his PCP.   Echo reviewed.  EF is normal, mild MR. No further inpatient CV workup planned.  No  outpatient cardiology follow-up necessary.  Will see as needed. Please call with questions.   Hillis Range, MD 06/03/2012 7:53 AM

## 2012-06-03 NOTE — Progress Notes (Signed)
Physical Therapy Treatment Patient Details Name: Keith [name] MRN: [id number] DOB: 1940/11/25 Today's Date: 06/03/2012 Time:  -     PT Assessment / Plan / Recommendation Comments on Treatment Session  Pt very pleasant & willing to participate in therapy.  Plans are for him to d/c home later today.  Daughter present entire session.  Trialed ambulation again today with pt only using IV pole for support; cont'd to mildly unsteady.  Recommended use of RW at d/c.      Follow Up Recommendations  Home health PT;Supervision for mobility/OOB     Does the patient have the potential to tolerate intense rehabilitation     Barriers to Discharge        Equipment Recommendations  None recommended by PT    Recommendations for Other Services    Frequency Min 3X/week   Plan Discharge plan remains appropriate;Frequency remains appropriate    Precautions / Restrictions Precautions Precautions: Fall Precaution Comments: trach collar, panda Restrictions Weight Bearing Restrictions: No       Mobility  Bed Mobility Bed Mobility: Supine to Sit;Sitting - Scoot to Edge of Bed Supine to Sit: 6: Modified independent (Device/Increase time);HOB flat Sitting - Scoot to Edge of Bed: 6: Modified independent (Device/Increase time) Transfers Transfers: Sit to Stand;Stand to Sit Sit to Stand: 6: Modified independent (Device/Increase time);With upper extremity assist;From bed Stand to Sit: 6: Modified independent (Device/Increase time);With upper extremity assist;With armrests;To chair/3-in-1 Ambulation/Gait Ambulation/Gait Assistance: 4: Min guard Ambulation Distance (Feet): 300 Feet Assistive device: Other (Comment) (pushed IV pole) Ambulation/Gait Assistance Details: Trialed ambulation again today with pt only pushing IV pole but still had some mild unsteadiness noted therefore encouraged him to cont to use RW at d/c to ensure stability & safety.  Pt agreeable.   Gait Pattern: Step-through  pattern;Decreased stride length;Narrow base of support Stairs: No Wheelchair Mobility Wheelchair Mobility: No      PT Goals Acute Rehab PT Goals Time For Goal Achievement: 06/16/12 Potential to Achieve Goals: Good Pt will go Sit to Stand: with modified independence PT Goal: Sit to Stand - Progress: Met Pt will go Stand to Sit: with modified independence PT Goal: Stand to Sit - Progress: Met Pt will Stand: with modified independence;with unilateral upper extremity support Pt will Ambulate: >150 feet;with modified independence;with least restrictive assistive device PT Goal: Ambulate - Progress: Progressing toward goal Pt will Perform Home Exercise Program: Independently  Visit Information  Last PT Received On: 06/03/12 Assistance Needed: +1    Subjective Data      Cognition  Cognition Overall Cognitive Status: Appears within functional limits for tasks assessed/performed Arousal/Alertness: Awake/alert Orientation Level: Appears intact for tasks assessed Behavior During Session: [facility] for tasks performed    Balance  Static Standing Balance Static Standing - Balance Support: No upper extremity supported Static Standing - Level of Assistance: 5: Stand by assistance Static Standing - Comment/# of Minutes: Standing with eyes open/closed, feet together/apart.    End of Session PT - End of Session Equipment Utilized During Treatment: Gait belt Activity Tolerance: Patient tolerated treatment well Patient left: in chair;with call bell/phone within reach;with family/visitor present     Verdell Face, PTA [phone] 06/03/2012

## 2012-06-03 NOTE — Progress Notes (Signed)
Oxygen sats remain 91-93 % on Room air. Will continue to monitor.

## 2012-06-05 ENCOUNTER — Ambulatory Visit (INDEPENDENT_AMBULATORY_CARE_PROVIDER_SITE_OTHER): Payer: Medicare Other

## 2012-06-05 DIAGNOSIS — I4891 Unspecified atrial fibrillation: Secondary | ICD-10-CM

## 2012-06-05 NOTE — Progress Notes (Signed)
Keith [name] gave a 30 day event monitor to [employer] that works for [employer], the instructions was gave to the aide on how to place the monitor on the patient and  how to use it and when to return it.

## 2012-06-09 ENCOUNTER — Other Ambulatory Visit: Payer: Self-pay | Admitting: Pulmonary Disease

## 2012-06-09 ENCOUNTER — Other Ambulatory Visit (HOSPITAL_COMMUNITY): Payer: Self-pay | Admitting: Otolaryngology

## 2012-06-10 ENCOUNTER — Other Ambulatory Visit: Payer: Self-pay

## 2012-06-11 ENCOUNTER — Telehealth: Payer: Self-pay | Admitting: Pulmonary Disease

## 2012-06-11 NOTE — Telephone Encounter (Signed)
Called the number that was provided and it is not a [place] #. It is a # for a personal residence.  The correct # is [phone]. I called this # and I got a fast busy signal. WCB  BQ has not seen this pt in the office. He saw the pt in the hospital. No pending OV's.

## 2012-06-12 NOTE — Telephone Encounter (Signed)
Spoke with [place] at [place]. Informed her that patient has not been seen in Office by Dr. Kendrick Fries. She stated that she saw appt he had Monday with ENT and that was actually what she was looking for for the information she needed. Nothing further needed at this time.

## 2012-06-13 ENCOUNTER — Other Ambulatory Visit: Payer: Self-pay

## 2012-06-13 ENCOUNTER — Inpatient Hospital Stay (HOSPITAL_COMMUNITY)
Admission: EM | Admit: 2012-06-13 | Discharge: 2012-06-20 | DRG: 202 | Disposition: A | Discharge status: Home Health | Payer: Medicare Other | Attending: Internal Medicine | Admitting: Internal Medicine

## 2012-06-13 ENCOUNTER — Emergency Department (HOSPITAL_COMMUNITY): Payer: Medicare Other

## 2012-06-13 ENCOUNTER — Encounter (HOSPITAL_COMMUNITY): Payer: Self-pay | Admitting: Emergency Medicine

## 2012-06-13 DIAGNOSIS — S2231XA Fracture of one rib, right side, initial encounter for closed fracture: Secondary | ICD-10-CM

## 2012-06-13 DIAGNOSIS — Z4789 Encounter for other orthopedic aftercare: Secondary | ICD-10-CM

## 2012-06-13 DIAGNOSIS — T17408A Unspecified foreign body in trachea causing other injury, initial encounter: Secondary | ICD-10-CM | POA: Diagnosis present

## 2012-06-13 DIAGNOSIS — Z79899 Other long term (current) drug therapy: Secondary | ICD-10-CM

## 2012-06-13 DIAGNOSIS — S2241XD Multiple fractures of ribs, right side, subsequent encounter for fracture with routine healing: Secondary | ICD-10-CM

## 2012-06-13 DIAGNOSIS — E785 Hyperlipidemia, unspecified: Secondary | ICD-10-CM | POA: Diagnosis present

## 2012-06-13 DIAGNOSIS — I10 Essential (primary) hypertension: Secondary | ICD-10-CM | POA: Diagnosis present

## 2012-06-13 DIAGNOSIS — J209 Acute bronchitis, unspecified: Principal | ICD-10-CM | POA: Diagnosis present

## 2012-06-13 DIAGNOSIS — S2220XD Unspecified fracture of sternum, subsequent encounter for fracture with routine healing: Secondary | ICD-10-CM

## 2012-06-13 DIAGNOSIS — I4891 Unspecified atrial fibrillation: Secondary | ICD-10-CM | POA: Diagnosis present

## 2012-06-13 DIAGNOSIS — Z430 Encounter for attention to tracheostomy: Secondary | ICD-10-CM

## 2012-06-13 DIAGNOSIS — R58 Hemorrhage, not elsewhere classified: Secondary | ICD-10-CM

## 2012-06-13 DIAGNOSIS — J384 Edema of larynx: Secondary | ICD-10-CM

## 2012-06-13 DIAGNOSIS — Z7289 Other problems related to lifestyle: Secondary | ICD-10-CM

## 2012-06-13 DIAGNOSIS — F1021 Alcohol dependence, in remission: Secondary | ICD-10-CM

## 2012-06-13 DIAGNOSIS — J9600 Acute respiratory failure, unspecified whether with hypoxia or hypercapnia: Secondary | ICD-10-CM

## 2012-06-13 DIAGNOSIS — A4901 Methicillin susceptible Staphylococcus aureus infection, unspecified site: Secondary | ICD-10-CM | POA: Diagnosis present

## 2012-06-13 DIAGNOSIS — IMO0002 Reserved for concepts with insufficient information to code with codable children: Secondary | ICD-10-CM | POA: Diagnosis present

## 2012-06-13 DIAGNOSIS — S2220XA Unspecified fracture of sternum, initial encounter for closed fracture: Secondary | ICD-10-CM

## 2012-06-13 DIAGNOSIS — F411 Generalized anxiety disorder: Secondary | ICD-10-CM | POA: Diagnosis present

## 2012-06-13 DIAGNOSIS — F05 Delirium due to known physiological condition: Secondary | ICD-10-CM | POA: Diagnosis present

## 2012-06-13 DIAGNOSIS — Z8674 Personal history of sudden cardiac arrest: Secondary | ICD-10-CM

## 2012-06-13 DIAGNOSIS — Z930 Tracheostomy status: Secondary | ICD-10-CM

## 2012-06-13 DIAGNOSIS — I469 Cardiac arrest, cause unspecified: Secondary | ICD-10-CM

## 2012-06-13 DIAGNOSIS — R509 Fever, unspecified: Secondary | ICD-10-CM | POA: Diagnosis not present

## 2012-06-13 DIAGNOSIS — G4700 Insomnia, unspecified: Secondary | ICD-10-CM | POA: Diagnosis present

## 2012-06-13 DIAGNOSIS — J9500 Unspecified tracheostomy complication: Secondary | ICD-10-CM

## 2012-06-13 DIAGNOSIS — J988 Other specified respiratory disorders: Secondary | ICD-10-CM | POA: Diagnosis present

## 2012-06-13 DIAGNOSIS — E7800 Pure hypercholesterolemia, unspecified: Secondary | ICD-10-CM | POA: Diagnosis present

## 2012-06-13 DIAGNOSIS — R0600 Dyspnea, unspecified: Secondary | ICD-10-CM

## 2012-06-13 LAB — CBC WITH DIFFERENTIAL/PLATELET
Basophils Absolute: 0 10*3/uL (ref 0.0–0.1)
Basophils Relative: 0 % (ref 0–1)
Eosinophils Absolute: 0.1 10*3/uL (ref 0.0–0.7)
MCH: 30.9 pg (ref 26.0–34.0)
MCHC: 34.5 g/dL (ref 30.0–36.0)
Neutrophils Relative %: 75 % (ref 43–77)
Platelets: 217 10*3/uL (ref 150–400)
RDW: 14.7 % (ref 11.5–15.5)

## 2012-06-13 LAB — BASIC METABOLIC PANEL
BUN: 14 mg/dL (ref 6–23)
Calcium: 9.7 mg/dL (ref 8.4–10.5)
GFR calc non Af Amer: 89 mL/min — ABNORMAL LOW (ref >90)
Glucose, Bld: 162 mg/dL — ABNORMAL HIGH (ref 70–99)

## 2012-06-13 NOTE — ED Provider Notes (Signed)
History     CSN: [id number]  Arrival date & time 06/13/12  2305   First MD Initiated Contact with Patient 06/13/12 2318      Chief Complaint  Patient presents with  . Shortness of Breath     Patient is a 72 y.o. male presenting with shortness of breath. The history is provided by the patient and a relative.  Shortness of Breath Severity:  Severe Onset quality:  Sudden Timing:  Constant Progression:  Resolved Chronicity:  New Relieved by: nebulizers. Worsened by:  Nothing tried Associated symptoms: chest pain   Associated symptoms: no fever and no vomiting   pt with h/o trach, EMS was called for SOB.  On arrival pt was tachypneic and hypoxic.  He was given nebulizers and he was suctioned and he began to improve.  He now feels improved No recent trach bleeding reported in past 24 hours  Pt had emergent trach placed earlier in the month for angioedema, and then complicated by post trach placement bleeding.  He is now at home and had recently started to use saline drops in trach to clear secretions   Past Medical History  Diagnosis Date  . HTN (hypertension)   . Hypercholesterolemia     Past Surgical History  Procedure Laterality Date  . Tracheostomy  05/22/12    emergent, angioedema  . Tracheostomy tube placement N/A 05/31/2012    Procedure: TRACHEOSTOMY;  Surgeon: Flo Shanks, MD;  Location: [facility] OR;  Service: ENT;  Laterality: N/A;  . Laryngoscopy N/A 05/31/2012    Procedure: LARYNGOSCOPY;  Surgeon: Flo Shanks, MD;  Location: [facility] OR;  Service: ENT;  Laterality: N/A;    History reviewed. No pertinent family history.  History  Substance Use Topics  . Smoking status: Never Smoker   . Smokeless tobacco: Not on file  . Alcohol Use: 7.0 oz/week    14 drink(s) per week     Comment: drinks daily      Review of Systems  Constitutional: Negative for fever.  Respiratory: Positive for shortness of breath.   Cardiovascular: Positive for chest pain.       Chronic CP  from recent rib fractures   Gastrointestinal: Negative for vomiting and diarrhea.  All other systems reviewed and are negative.    Allergies  Lisinopril  Home Medications   Current Outpatient Rx  Name  Route  Sig  Dispense  Refill  . amLODipine (NORVASC) 10 MG tablet   Oral   Take 1 tablet (10 mg total) by mouth daily.   30 tablet   0   . atorvastatin (LIPITOR) 40 MG tablet   Oral   Take 40 mg by mouth every morning.         Marland Kitchen EPINEPHrine (EPIPEN) 0.3 mg/0.3 mL DEVI   Intramuscular   Inject 0.3 mLs (0.3 mg total) into the muscle once.   1 Device   0   . folic acid (FOLVITE) 1 MG tablet   Oral   Take 1 tablet (1 mg total) by mouth daily.         Marland Kitchen ipratropium (ATROVENT) 0.06 % nasal spray   Nasal   Place 2 sprays into the nose 4 (four) times daily.         Marland Kitchen levofloxacin (LEVAQUIN) 500 MG tablet   Oral   Take 1 tablet (500 mg total) by mouth daily.         . metoprolol tartrate (LOPRESSOR) 25 MG tablet   Oral   Take 1 tablet (  25 mg total) by mouth 2 (two) times daily.         . mometasone (NASONEX) 50 MCG/ACT nasal spray   Nasal   Place 2 sprays into the nose daily.         Marland Kitchen thiamine 100 MG tablet   Oral   Take 1 tablet (100 mg total) by mouth daily.         . traMADol (ULTRAM) 50 MG tablet   Oral   Take 1 tablet (50 mg total) by mouth every 6 (six) hours as needed for pain.   30 tablet        BP 146/59  Pulse 113  Temp(Src) 98.6 F (37 C) (Oral)  Resp 16  SpO2 96%  Physical Exam CONSTITUTIONAL: Well developed/well nourished HEAD: Normocephalic/atraumatic EYES: EOMI/PERRL ENMT: Mucous membranes moist NECK: supple no meningeal signs. Trach in place.  No stridor.  No crepitance  Noted to neck.  No active bleeding from trach SPINE:entire spine nontender CV: S1/S2 noted, no murmurs/rubs/gallops noted LUNGS: Lungs are clear to auscultation bilaterally, no apparent distress ABDOMEN: soft, nontender, no rebound or guarding NEURO:  Pt is awake/alert, moves all extremitiesx4 EXTREMITIES: pulses normal, full ROM. Symmetric pitting edema to bilateral LE SKIN: warm, color normal PSYCH: no abnormalities of mood noted  ED Course  Procedures  Labs Reviewed  CBC WITH DIFFERENTIAL - Abnormal; Notable for the following:    RBC 3.14 (*)    Hemoglobin 9.7 (*)    HCT 28.1 (*)    All other components within normal limits  BASIC METABOLIC PANEL    12:09 AM Pt currently stable but had significant episode of hypoxia/tachypnea at home that has improved. He has had recent complicated course since trach placement earlier in the month.  Will ask RT to suction trach and then reassess.  Labs/imaging pending at this time  12:55 AM Pt stable.  He was suctioned and feels some improvement He reports continued chest wall pain from recent rib fractures D/w [facility] to admit for OBS for monitoring, frequent suctioning given how ill patient was prior to arrival to the ED Pt agreeable with plan  MDM  Nursing notes including past medical history and social history reviewed and considered in documentation xrays reviewed and considered Labs/vital reviewed and considered Previous records reviewed and considered - h/o recent trach placement        Date: 06/13/2012  Rate: 110  Rhythm: sinus tachycardia  QRS Axis: left  Intervals: normal  ST/T Wave abnormalities: nonspecific ST changes  Conduction Disutrbances:none  Narrative Interpretation: poor quality, artifact noted  Old EKG Reviewed: rate is slower on today's ekg   Joya Gaskins, MD 06/14/12 [phone]

## 2012-06-13 NOTE — ED Notes (Signed)
According to EMS, the daughter called EMS due to her father having a tracheostomy and not being able to breathe.  According to EMS, when they first got there sats in 70's, breathing 45times per minute, patient was alert and would not sit down.  Color was still good.  They started bagging, difficult to bag, started Albuterol and suctioned him.  Did a second treatment of albuterol and atrovent and started an IV.  They administered 125mg  solumedrol.  EMS suctioned the patient about three to four times.  After the treatments, he was easier to suction.  There was a large mucous plug in the nebulizer. The patient was brought to [facility] for evaluation.

## 2012-06-13 NOTE — ED Notes (Signed)
New and old EKG given to Dr. Wickline. Copy placed in pt chart. 

## 2012-06-14 ENCOUNTER — Encounter (HOSPITAL_COMMUNITY): Payer: Self-pay | Admitting: Internal Medicine

## 2012-06-14 DIAGNOSIS — E785 Hyperlipidemia, unspecified: Secondary | ICD-10-CM

## 2012-06-14 DIAGNOSIS — I10 Essential (primary) hypertension: Secondary | ICD-10-CM

## 2012-06-14 DIAGNOSIS — Z930 Tracheostomy status: Secondary | ICD-10-CM

## 2012-06-14 DIAGNOSIS — S2220XA Unspecified fracture of sternum, initial encounter for closed fracture: Secondary | ICD-10-CM

## 2012-06-14 DIAGNOSIS — S2231XA Fracture of one rib, right side, initial encounter for closed fracture: Secondary | ICD-10-CM

## 2012-06-14 DIAGNOSIS — R0989 Other specified symptoms and signs involving the circulatory and respiratory systems: Secondary | ICD-10-CM

## 2012-06-14 DIAGNOSIS — J988 Other specified respiratory disorders: Secondary | ICD-10-CM

## 2012-06-14 LAB — BASIC METABOLIC PANEL
CO2: 28 mEq/L (ref 19–32)
Calcium: 9.6 mg/dL (ref 8.4–10.5)
Chloride: 103 mEq/L (ref 96–112)
Creatinine, Ser: 0.66 mg/dL (ref 0.50–1.35)
Glucose, Bld: 188 mg/dL — ABNORMAL HIGH (ref 70–99)

## 2012-06-14 LAB — CBC
HCT: 26.5 % — ABNORMAL LOW (ref 39.0–52.0)
MCH: 31.3 pg (ref 26.0–34.0)
MCV: 89.2 fL (ref 78.0–100.0)
Platelets: 202 10*3/uL (ref 150–400)
RDW: 14.7 % (ref 11.5–15.5)

## 2012-06-14 LAB — MRSA PCR SCREENING: MRSA by PCR: POSITIVE — AB

## 2012-06-14 MED ORDER — MENTHOL 3 MG MT LOZG
1.0000 | LOZENGE | OROMUCOSAL | Status: DC | PRN
  Filled 2012-06-14: qty 9

## 2012-06-14 MED ORDER — TRAMADOL HCL 50 MG PO TABS
50.0000 mg | ORAL_TABLET | Freq: Four times a day (QID) | ORAL | Status: DC | PRN
  Administered 2012-06-14: 50 mg via ORAL
  Filled 2012-06-14: qty 1

## 2012-06-14 MED ORDER — ALBUTEROL SULFATE (5 MG/ML) 0.5% IN NEBU
2.5000 mg | INHALATION_SOLUTION | RESPIRATORY_TRACT | Status: DC | PRN
  Administered 2012-06-14: 2.5 mg via RESPIRATORY_TRACT
  Filled 2012-06-14: qty 0.5

## 2012-06-14 MED ORDER — ZOLPIDEM TARTRATE 5 MG PO TABS
5.0000 mg | ORAL_TABLET | Freq: Every evening | ORAL | Status: DC | PRN
  Administered 2012-06-14 – 2012-06-19 (×3): 5 mg via ORAL
  Filled 2012-06-14 (×3): qty 1

## 2012-06-14 MED ORDER — METOPROLOL TARTRATE 25 MG PO TABS
25.0000 mg | ORAL_TABLET | Freq: Two times a day (BID) | ORAL | Status: DC
  Administered 2012-06-14 – 2012-06-20 (×13): 25 mg via ORAL
  Filled 2012-06-14 (×15): qty 1

## 2012-06-14 MED ORDER — AMLODIPINE BESYLATE 10 MG PO TABS
10.0000 mg | ORAL_TABLET | Freq: Every day | ORAL | Status: DC
  Administered 2012-06-14 – 2012-06-20 (×6): 10 mg via ORAL
  Filled 2012-06-14 (×7): qty 1

## 2012-06-14 MED ORDER — TRAMADOL HCL 50 MG PO TABS
100.0000 mg | ORAL_TABLET | Freq: Four times a day (QID) | ORAL | Status: DC | PRN
Start: 2012-06-14 — End: 2012-06-20
  Administered 2012-06-14 – 2012-06-18 (×10): 100 mg via ORAL
  Filled 2012-06-14 (×3): qty 2
  Filled 2012-06-14: qty 1
  Filled 2012-06-14 (×3): qty 2
  Filled 2012-06-14: qty 1
  Filled 2012-06-14 (×3): qty 2

## 2012-06-14 MED ORDER — ALBUTEROL SULFATE (5 MG/ML) 0.5% IN NEBU
2.5000 mg | INHALATION_SOLUTION | RESPIRATORY_TRACT | Status: DC
  Administered 2012-06-14 – 2012-06-16 (×13): 2.5 mg via RESPIRATORY_TRACT
  Filled 2012-06-14 (×13): qty 0.5

## 2012-06-14 MED ORDER — IPRATROPIUM BROMIDE 0.06 % NA SOLN
2.0000 | Freq: Four times a day (QID) | NASAL | Status: DC
  Administered 2012-06-14 – 2012-06-19 (×16): 2 via NASAL
  Filled 2012-06-14: qty 15

## 2012-06-14 MED ORDER — ACETAMINOPHEN 325 MG PO TABS
650.0000 mg | ORAL_TABLET | Freq: Four times a day (QID) | ORAL | Status: DC | PRN
  Administered 2012-06-15 – 2012-06-17 (×5): 650 mg via ORAL
  Filled 2012-06-14 (×5): qty 2

## 2012-06-14 MED ORDER — FOLIC ACID 1 MG PO TABS
1.0000 mg | ORAL_TABLET | Freq: Every day | ORAL | Status: DC
  Administered 2012-06-14 – 2012-06-20 (×6): 1 mg via ORAL
  Filled 2012-06-14 (×7): qty 1

## 2012-06-14 MED ORDER — ONDANSETRON HCL 4 MG PO TABS: 4.0000 mg | ORAL_TABLET | Freq: Four times a day (QID) | ORAL | Status: DC | PRN

## 2012-06-14 MED ORDER — SODIUM CHLORIDE 0.9 % IJ SOLN
3.0000 mL | Freq: Two times a day (BID) | INTRAMUSCULAR | Status: DC
  Administered 2012-06-14 – 2012-06-20 (×7): 3 mL via INTRAVENOUS

## 2012-06-14 MED ORDER — ACETAMINOPHEN 650 MG RE SUPP: 650.0000 mg | Freq: Four times a day (QID) | RECTAL | Status: DC | PRN

## 2012-06-14 MED ORDER — ATORVASTATIN CALCIUM 40 MG PO TABS
40.0000 mg | ORAL_TABLET | Freq: Every morning | ORAL | Status: DC
  Administered 2012-06-14 – 2012-06-20 (×6): 40 mg via ORAL
  Filled 2012-06-14 (×7): qty 1

## 2012-06-14 MED ORDER — MUPIROCIN 2 % EX OINT
TOPICAL_OINTMENT | Freq: Two times a day (BID) | CUTANEOUS | Status: DC
  Administered 2012-06-14 – 2012-06-15 (×3): via NASAL
  Administered 2012-06-15: 1 via NASAL
  Administered 2012-06-16 – 2012-06-17 (×3): via NASAL
  Administered 2012-06-17: 1 via NASAL
  Administered 2012-06-18 – 2012-06-20 (×4): via NASAL
  Filled 2012-06-14: qty 22

## 2012-06-14 MED ORDER — SODIUM CHLORIDE 0.9 % IV SOLN
INTRAVENOUS | Status: DC
  Administered 2012-06-14: 20 mL/h via INTRAVENOUS
  Administered 2012-06-15: 1 mL via INTRAVENOUS

## 2012-06-14 MED ORDER — CHLORHEXIDINE GLUCONATE CLOTH 2 % EX PADS
6.0000 | MEDICATED_PAD | Freq: Every day | CUTANEOUS | Status: DC
  Administered 2012-06-14 – 2012-06-20 (×5): 6 via TOPICAL

## 2012-06-14 MED ORDER — FLUTICASONE PROPIONATE 50 MCG/ACT NA SUSP
1.0000 | Freq: Every day | NASAL | Status: DC
  Administered 2012-06-14 – 2012-06-20 (×5): 1 via NASAL
  Filled 2012-06-14: qty 16

## 2012-06-14 MED ORDER — LORAZEPAM 2 MG/ML IJ SOLN
0.5000 mg | Freq: Four times a day (QID) | INTRAMUSCULAR | Status: DC | PRN
  Administered 2012-06-15: 0.5 mg via INTRAVENOUS
  Administered 2012-06-15 – 2012-06-20 (×5): 1 mg via INTRAVENOUS
  Filled 2012-06-14 (×6): qty 1

## 2012-06-14 MED ORDER — VITAMIN B-1 100 MG PO TABS
100.0000 mg | ORAL_TABLET | Freq: Every day | ORAL | Status: DC
  Administered 2012-06-14 – 2012-06-20 (×6): 100 mg via ORAL
  Filled 2012-06-14 (×7): qty 1

## 2012-06-14 MED ORDER — ONDANSETRON HCL 4 MG/2ML IJ SOLN: 4.0000 mg | Freq: Four times a day (QID) | INTRAMUSCULAR | Status: DC | PRN

## 2012-06-14 MED ORDER — MORPHINE SULFATE 4 MG/ML IJ SOLN
4.0000 mg | Freq: Once | INTRAMUSCULAR | Status: AC
  Administered 2012-06-14: 4 mg via INTRAVENOUS
  Filled 2012-06-14: qty 1

## 2012-06-14 NOTE — Progress Notes (Signed)
Utilization review completed.  P.J. Latrelle Bazar,RN,BSN Case Manager [phone]  

## 2012-06-14 NOTE — Progress Notes (Signed)
Pt's daughter came to RN station prior to shift change to state that her father was having trouble breathing.  Staff member told her that she would locate pts RN and have her come to his room.  Daughter came back to desk minutes later and stated that he needed help.  I arrived to patients room and found him standing with increased WOB.  Oxygen sats 98-100% on 28% trach collar, afib in controlled rate, BP 131/68, resp rate 24.  Pt had auditory wheezing and was given PRN breathing treatment by nightshift RN and oncoming dayshift RN.  Respiratory therapy was notified and was in to assess patient.  During treatment, pt stated that it was becoming easier to breathe.  Staff remained with patient throughout treatment.  Respiratory therapy attempted to suction patient following treatment without success.  Inner cannula was removed and was noted to be plugged with dried blood tinged secretions. Pt began to breathe easier with less work of breathing following removal of inner cannula.  Cannula was changed.  Patient was monitored by RN staff at bedside while pt rested.  Pt was instructed to use call bell should another episode like this occur again.  Pt and daughter were provided with day shift RN's ascom number for immediate contact.  Report was completed at bedside to include daughter and patient.  Plan of care was established and included scheduled breathing treatments, frequent inspection of inner cannula status, and suctioning as needed.  Gahel Safley, [facility]

## 2012-06-14 NOTE — Progress Notes (Signed)
[facility] Progress Note [place] TEAM 1 - Stepdown/ICU TEAM   IDUS RATHKE ION:[id number] DOB: March 14, 1940 DOA: 06/13/2012 PCP: No primary provider on file.  Brief narrative: Keith [name] is a 72 y.o. male on tracheal collar because of recent laryngeal edema requiring emergency tracheostomy secondary to angioedema from ACE inhibitors and had subsequent cardiac arrest due to airway obstruction and had CPR presents to the ER because of sudden onset of difficulty breathing and EMS was called. EMS found patient was tachypneic and tachycardic and suctioning was done after which patient improved and was brought to the ER. In the ER chest x-ray does not show anything acute. And was further suctioned in the ER but showed no blood in the suction. But due to patient's recent cardiac arrest due to obstructive airway patient has been admitted for observation. Patient states that his breathing is much improved and denies any pain shortness of breath. Denies any fever chills.   Assessment/Plan: Principal Problem:   Airway obstruction -Likely due to irritation from saline flushes and dried blood in trach -Continue trach care and when necessary saline flushes to remove the dried blood in the trachea -Do not suspect that he has any tracheal bronchitis at this time  Active Problems:   Tracheostomy in place -Per Dr. Jenne Pane to be removed possibly later this week    Hypertension -Continue home meds    Hyperlipidemia -Continue statin    Fracture of rib of right side/Fracture, sternum closed -Continue tramadol  Atrial fibrillation -Noted on EKG on previous admission on 4/12 -He was evaluated by Dr. Melburn Popper and it was decided to forego anticoagulation do to his recent bleeding as a result of the tracheostomy downsizing -He was supposed to be discharged with a 30 day event monitor and to followup with [facility] cardiology -Echo revealed mild LVH and mild aortic regurgitation- no other  significant abnormality   Code Status: Full code Family Communication: With daughter Disposition Plan: Follow in step down Consultants: None  Procedures: None  Antibiotics: None  DVT prophylaxis: SCDs  HPI/Subjective: Patient had an episode this morning when he was unable to breathe. I spoke with the respiratory therapist about this and she told me the inner cannula was clogged with mucus and a small amount of blood. When she cleaned this out his breathing improved automatically. The patient states during my exam that he is breathing well however does have a cough and each time he coughs he has pain in the lower ribs on his right side which were fractured during CPR that was performed last week.  His daughter states that the trach was due to be removed this week however due to dried blood in the trachea Dr. Jenne Pane (ENT) ordered saline flushes which resulted in the current problems we are seeing with tracheal obstruction and excessive mucus production. -Of note the dried blood is secondary to an artery that apparently was nicked during down sizing of the trach-per daughter the patient drained a large amount of blood via the tracheostomy and it was at this time that he had a cardiac arrest and required CPR - artery needed to be cauterized   Objective: Blood pressure 131/59, pulse 77, temperature 97.8 F (36.6 C), temperature source Oral, resp. rate 17, height 5\' 7"  (1.702 m), weight 82.8 kg (182 lb 8.7 oz), SpO2 100.00%.  Intake/Output Summary (Last 24 hours) at 06/14/12 1350 Last data filed at 06/14/12 1300  Gross per 24 hour  Intake 460.67 ml  Output  300 ml  Net 160.67 ml     Exam: General: Awake alert oriented x3, No acute respiratory distress Lungs: Mild rhonchi and upper airway wheezing. Mild cough No acute respiratory distress/trach in place Cardiovascular: Regular rate and rhythm without murmur gallop or rub normal S1 and S2 Abdomen: Nontender, nondistended, soft,  bowel sounds positive, no rebound, no ascites, no appreciable mass Extremities: No significant cyanosis, clubbing, or edema bilateral lower extremities  Data Reviewed: Basic Metabolic Panel:  Recent Labs Lab 06/13/12 2326 06/14/12 0525  NA 141 140  K 3.5 4.1  CL 101 103  CO2 29 28  GLUCOSE 162* 188*  BUN 14 14  CREATININE 0.78 0.66  CALCIUM 9.7 9.6   Liver Function Tests: No results found for this basename: AST, ALT, ALKPHOS, BILITOT, PROT, ALBUMIN,  in the last 168 hours No results found for this basename: LIPASE, AMYLASE,  in the last 168 hours No results found for this basename: AMMONIA,  in the last 168 hours CBC:  Recent Labs Lab 06/13/12 2326 06/14/12 0525  WBC 10.0 8.8  NEUTROABS 7.4  --   HGB 9.7* 9.3*  HCT 28.1* 26.5*  MCV 89.5 89.2  PLT 217 202   Cardiac Enzymes: No results found for this basename: CKTOTAL, CKMB, CKMBINDEX, TROPONINI,  in the last 168 hours BNP (last 3 results)  Recent Labs  05/22/12 1817  PROBNP 22.4   CBG: No results found for this basename: GLUCAP,  in the last 168 hours  Recent Results (from the past 240 hour(s))  MRSA PCR SCREENING     Status: Abnormal   Collection Time    06/14/12  2:30 AM      Result Value Range Status   MRSA by PCR POSITIVE (*) NEGATIVE Final   Comment:            The GeneXpert MRSA Assay (FDA     approved for NASAL specimens     only), is one component of a     comprehensive MRSA colonization     surveillance program. It is not     intended to diagnose MRSA     infection nor to guide or     monitor treatment for     MRSA infections.     RESULT CALLED TO, READ BACK BY AND VERIFIED WITH:     OFORI,E RN [id number] AT [phone] SKEEN,P     Studies:  Recent x-ray studies have been reviewed in detail by the Attending Physician  Scheduled Meds:  Scheduled Meds: . albuterol  2.5 mg Nebulization Q4H  . amLODipine  10 mg Oral Daily  . atorvastatin  40 mg Oral q morning - 10a  . Chlorhexidine Gluconate  Cloth  6 each Topical Q0600  . fluticasone  1 spray Each Nare Daily  . folic acid  1 mg Oral Daily  . ipratropium  2 spray Nasal QID  . metoprolol tartrate  25 mg Oral BID  . mupirocin ointment   Nasal BID  . sodium chloride  3 mL Intravenous Q12H  . thiamine  100 mg Oral Daily   Continuous Infusions: . sodium chloride 20 mL/hr (06/14/12 0428)    Time spent on care of this patient: 35 minutes   [facility]  [facility] Office  [phone] Pager - Text Page per Loretha Stapler as per below:  On-Call/Text Page:      [email or web address]      password [email or web address]  If 7PM-7AM, please contact night-coverage [email or web address] Password [email or web address] 06/14/2012, 1:50 PM   LOS:  1 day

## 2012-06-14 NOTE — H&P (Signed)
[facility] History and Physical  Keith [name] JXB:[id number] DOB: [birth year] DOA: 06/13/2012  Referring physician: ER physician. PCP: No primary provider on file.   Chief Complaint: Airway obstruction.  HPI: Keith [name] is a 72 y.o. male on tracheal collar because of recent laryngeal edema requiring emergency tracheostomy secondary to angioedema from ACE inhibitors and had subsequent cardiac arrest due to airway obstruction and had CPR presents to the ER because of sudden onset of difficulty breathing and EMS was called. EMS found patient was tachypneic and tachycardic and suctioning was done after which patient improved and was brought to the ER. In the ER chest x-ray does not show anything acute. And was further suctioned in the ER but showed no blood in the suction. But due to patient's recent cardiac arrest due to obstructive airway patient has been admitted for observation. Patient states that his breathing is much improved and denies any pain shortness of breath. Denies any fever chills.  Review of Systems: As presented in the history of presenting illness, rest negative.  Past Medical History  Diagnosis Date  . HTN (hypertension)   . Hypercholesterolemia    Past Surgical History  Procedure Laterality Date  . Tracheostomy  05/22/12    emergent, angioedema  . Tracheostomy tube placement N/A 05/31/2012    Procedure: TRACHEOSTOMY;  Surgeon: Flo Shanks, MD;  Location: [facility] OR;  Service: ENT;  Laterality: N/A;  . Laryngoscopy N/A 05/31/2012    Procedure: LARYNGOSCOPY;  Surgeon: Flo Shanks, MD;  Location: [facility] OR;  Service: ENT;  Laterality: N/A;   Social History:  reports that he has never smoked. He does not have any smokeless tobacco history on file. He reports that he drinks about 7.0 ounces of alcohol per week. He reports that he does not use illicit drugs. Lives at home. where does patient live-- Can do ADLs. Can patient participate in ADLs?  Allergies   Allergen Reactions  . Lisinopril Other (See Comments)    Presented with laryngeal edema - likely caused is lisinopril     History reviewed. No pertinent family history.    Prior to Admission medications   Medication Sig Start Date End Date Taking? Authorizing Provider  amLODipine (NORVASC) 10 MG tablet Take 1 tablet (10 mg total) by mouth daily. 05/30/12  Yes Coralyn Helling, MD  atorvastatin (LIPITOR) 40 MG tablet Take 40 mg by mouth every morning.   Yes Historical Provider, MD  EPINEPHrine (EPIPEN) 0.3 mg/0.3 mL DEVI Inject 0.3 mLs (0.3 mg total) into the muscle once. 06/03/12  Yes Leslye Peer, MD  folic acid (FOLVITE) 1 MG tablet Take 1 tablet (1 mg total) by mouth daily. 05/30/12  Yes Coralyn Helling, MD  ipratropium (ATROVENT) 0.06 % nasal spray Place 2 sprays into the nose 4 (four) times daily.   Yes Historical Provider, MD  levofloxacin (LEVAQUIN) 500 MG tablet Take 1 tablet (500 mg total) by mouth daily. 06/03/12  Yes Leslye Peer, MD  metoprolol tartrate (LOPRESSOR) 25 MG tablet Take 1 tablet (25 mg total) by mouth 2 (two) times daily. 06/03/12  Yes Leslye Peer, MD  mometasone (NASONEX) 50 MCG/ACT nasal spray Place 2 sprays into the nose daily.   Yes Historical Provider, MD  thiamine 100 MG tablet Take 1 tablet (100 mg total) by mouth daily. 05/31/12  Yes Coralyn Helling, MD  traMADol (ULTRAM) 50 MG tablet Take 1 tablet (50 mg total) by mouth every 6 (six) hours as needed for pain. 06/03/12  Yes Les Pou  Delton Coombes, MD   Physical Exam: Filed Vitals:   06/13/12 2308 06/13/12 2309 06/14/12 0200 06/14/12 0210  BP: 146/59   148/73  Pulse: 113 108  95  Temp: 98.6 F (37 C)   99.3 F (37.4 C)  TempSrc: Oral   Oral  Resp: 16 21  16   Height:    5\' 7"  (1.702 m)  Weight:   83.6 kg (184 lb 4.9 oz) 82.8 kg (182 lb 8.7 oz)  SpO2: 96%   98%     General:  Well-developed and nourished.  Eyes: Anicteric no pallor.  ENT: Tracheal collar in place. No discharge from the ears eyes nose and the tracheal  collar.  Neck: Tracheal collar in place.  Cardiovascular: S1-S2 heard.  Respiratory: No rhonchi or crepitations.  Abdomen: Soft nontender bowel sounds present.  Skin: No rash.  Musculoskeletal: No edema.  Psychiatric: Appears normal.  Neurologic: Alert and oriented to time place and person. Moves all extremities.  Labs on Admission:  Basic Metabolic Panel:  Recent Labs Lab 06/13/12 2326  NA 141  K 3.5  CL 101  CO2 29  GLUCOSE 162*  BUN 14  CREATININE 0.78  CALCIUM 9.7   Liver Function Tests: No results found for this basename: AST, ALT, ALKPHOS, BILITOT, PROT, ALBUMIN,  in the last 168 hours No results found for this basename: LIPASE, AMYLASE,  in the last 168 hours No results found for this basename: AMMONIA,  in the last 168 hours CBC:  Recent Labs Lab 06/13/12 2326  WBC 10.0  NEUTROABS 7.4  HGB 9.7*  HCT 28.1*  MCV 89.5  PLT 217   Cardiac Enzymes: No results found for this basename: CKTOTAL, CKMB, CKMBINDEX, TROPONINI,  in the last 168 hours  BNP (last 3 results)  Recent Labs  05/22/12 1817  PROBNP 22.4   CBG: No results found for this basename: GLUCAP,  in the last 168 hours  Radiological Exams on Admission: Dg Chest Portable 1 View  06/14/2012  *RADIOLOGY REPORT*  Clinical Data: Shortness of breath.  PORTABLE CHEST - 1 VIEW  Comparison: 06/01/2012  Findings: Tracheostomy remains in place.  Shallow inspiration. Heart size and pulmonary vascularity are normal for technique. There is interval development or increase of linear atelectasis in both mid and lower lungs.  No specific focal consolidation or airspace disease.  No blunting of costophrenic angles.  No pneumothorax.  Mediastinal contours appear intact.  IMPRESSION: Increasing linear atelectasis in the mid and lower lungs.   Original Report Authenticated By: Burman Nieves, M.D.      Assessment/Plan Principal Problem:   Airway obstruction Active Problems:   Hypertension    Hyperlipidemia   1. Airway obstruction in a patient with tracheal collar - since patient has had recent cardiac arrest with tracheal airway obstruction at this time patient has been admitted for observation. Presently patient did not have any bloody discharge from the tracheal collar or is afebrile. Closely observe. 2. Hypertension - continue present medications. 3. Hyperlipidemia - continue present medications. 4. History of alcoholism - patient has not had alcohol for 3 weeks. Continue thiamine.    Code Status: Full code.  Family Communication: Patient's daughter at the bedside.  Disposition Plan: Admit for observation.    Lakoda Raske N. [facility] Pager [phone].  If 7PM-7AM, please contact night-coverage [email or web address] Password [facility] 06/14/2012, 2:41 AM

## 2012-06-14 NOTE — Progress Notes (Signed)
Patient suction per physician order x 2. Minimal amount of tinged pink and tan secretionswith both Trach suctions.Cleaned inner cannula  Due to no extra cannulas available. Will continue to monitor.

## 2012-06-15 DIAGNOSIS — IMO0001 Reserved for inherently not codable concepts without codable children: Secondary | ICD-10-CM

## 2012-06-15 DIAGNOSIS — J9500 Unspecified tracheostomy complication: Secondary | ICD-10-CM

## 2012-06-15 DIAGNOSIS — J9600 Acute respiratory failure, unspecified whether with hypoxia or hypercapnia: Secondary | ICD-10-CM

## 2012-06-15 NOTE — Progress Notes (Signed)
[facility] Progress Note [place] TEAM 1 - Stepdown/ICU TEAM   Keith [name] ZOX:[id number] DOB: 1940/03/27 DOA: 06/13/2012 PCP: No primary provider on file.  Brief narrative: Keith [name] is a 72 y.o. male on tracheal collar because of recent laryngeal edema requiring emergency tracheostomy secondary to angioedema from ACE inhibitors and had subsequent cardiac arrest due to airway obstruction and had CPR presents to the ER because of sudden onset of difficulty breathing and EMS was called. EMS found patient was tachypneic and tachycardic and suctioning was done after which patient improved and was brought to the ER. In the ER chest x-ray does not show anything acute. And was further suctioned in the ER but showed no blood in the suction. But due to patient's recent cardiac arrest due to obstructive airway patient has been admitted for observation. Patient states that his breathing is much improved and denies any pain shortness of breath. Denies any fever chills.  His daughter states that the trach was due to be removed this week however due to dried blood in the trachea, Dr. Jenne [name] (ENT), ordered saline flushes which resulted in the current problems we are seeing with tracheal obstruction and excessive mucus production. -Of note the dried blood is secondary to an artery that apparently was nicked during down sizing of the trach-per daughter the patient drained a large amount of blood via the tracheostomy and it was at this time that he had a cardiac arrest and required CPR - artery needed to be cauterized  Assessment/Plan: Principal Problem:   Airway obstruction -Likely due to irritation from saline flushes and dried blood in trach -Continue trach care and when necessary saline flushes to remove the dried blood in the trachea -Do not suspect that he has any tracheo-bronchitis at this time - consult Dr Keith [name] on Monday (see below)  Active Problems:   Tracheostomy in  place -Per Dr. Jenne [name] to be removed possibly later this week - family requested if we can consult him for removal of trach prior to discharging the patient from the hospital    Hypertension -Continue home meds    Hyperlipidemia -Continue statin    Fracture of rib of right side/Fracture, sternum closed -Continue tramadol  Atrial fibrillation -Noted on EKG on previous admission on 4/12 -He was evaluated by Dr. Melburn [name] and it was decided to forego anticoagulation due to his recent bleeding as a result of the tracheostomy downsizing -He was supposed to be discharged with a 30 day event monitor and to followup with Keith [name] cardiology -Echo revealed mild LVH and mild aortic regurgitation- no other significant abnormality   Code Status: Full code Family Communication: With daughter Disposition Plan: Follow in step down Consultants: None  Procedures: None  Antibiotics: None  DVT prophylaxis: SCDs  HPI/Subjective: Pt still having a large amount of secretions suctioned up from trachea. Some dried blood continues to be suctioned as well. No other complaints. Chest pain from rib/sternal fractures is overall better controlled with increase in Tramadol.   Objective: Blood pressure 106/37, pulse 73, temperature 98.1 F (36.7 C), temperature source Oral, resp. rate 21, height 5\' 7"  (1.702 m), weight 83 kg (182 lb 15.7 oz), SpO2 99.00%.  Intake/Output Summary (Last 24 hours) at 06/15/12 1547 Last data filed at 06/15/12 1400  Gross per 24 hour  Intake    540 ml  Output    825 ml  Net   -285 ml     Exam: General: Awake alert oriented x3, No acute respiratory distress  Lungs: Mild rhonchi and upper airway wheezing. Mild cough No acute respiratory distress/trach in place Cardiovascular: Regular rate and rhythm without murmur gallop or rub normal S1 and S2 Abdomen: Nontender, nondistended, soft, bowel sounds positive, no rebound, no ascites, no appreciable mass Extremities: No  significant cyanosis, clubbing, or edema bilateral lower extremities  Data Reviewed: Basic Metabolic Panel:  Recent Labs Lab 06/13/12 2326 06/14/12 0525  NA 141 140  K 3.5 4.1  CL 101 103  CO2 29 28  GLUCOSE 162* 188*  BUN 14 14  CREATININE 0.78 0.66  CALCIUM 9.7 9.6   Liver Function Tests: No results found for this basename: AST, ALT, ALKPHOS, BILITOT, PROT, ALBUMIN,  in the last 168 hours No results found for this basename: LIPASE, AMYLASE,  in the last 168 hours No results found for this basename: AMMONIA,  in the last 168 hours CBC:  Recent Labs Lab 06/13/12 2326 06/14/12 0525  WBC 10.0 8.8  NEUTROABS 7.4  --   HGB 9.7* 9.3*  HCT 28.1* 26.5*  MCV 89.5 89.2  PLT 217 202   Cardiac Enzymes: No results found for this basename: CKTOTAL, CKMB, CKMBINDEX, TROPONINI,  in the last 168 hours BNP (last 3 results)  Recent Labs  05/22/12 1817  PROBNP 22.4   CBG: No results found for this basename: GLUCAP,  in the last 168 hours  Recent Results (from the past 240 hour(s))  MRSA PCR SCREENING     Status: Abnormal   Collection Time    06/14/12  2:30 AM      Result Value Range Status   MRSA by PCR POSITIVE (*) NEGATIVE Final   Comment:            The GeneXpert MRSA Assay (FDA     approved for NASAL specimens     only), is one component of a     comprehensive MRSA colonization     surveillance program. It is not     intended to diagnose MRSA     infection nor to guide or     monitor treatment for     MRSA infections.     RESULT CALLED TO, READ BACK BY AND VERIFIED WITH:     OFORI,E RN [id number] AT [phone] SKEEN,P     Studies:  Recent x-ray studies have been reviewed in detail by the Attending Physician  Scheduled Meds:  Scheduled Meds: . albuterol  2.5 mg Nebulization Q4H  . amLODipine  10 mg Oral Daily  . atorvastatin  40 mg Oral q morning - 10a  . Chlorhexidine Gluconate Cloth  6 each Topical Q0600  . fluticasone  1 spray Each Nare Daily  . folic acid  1  mg Oral Daily  . ipratropium  2 spray Nasal QID  . metoprolol tartrate  25 mg Oral BID  . mupirocin ointment   Nasal BID  . sodium chloride  3 mL Intravenous Q12H  . thiamine  100 mg Oral Daily   Continuous Infusions: . sodium chloride 1 mL (06/15/12 1146)    Time spent on care of this patient: 25 minutes   [facility]  [facility] Office  [phone] Pager - Text Page per Loretha Stapler as per below:  On-Call/Text Page:      [email or web address]      password [email or web address]  If 7PM-7AM, please contact night-coverage [email or web address] Password [facility] 06/15/2012, 3:47 PM   LOS: 2 days

## 2012-06-16 ENCOUNTER — Encounter (HOSPITAL_COMMUNITY): Payer: Self-pay

## 2012-06-16 MED ORDER — OXYCODONE-ACETAMINOPHEN 5-325 MG PO TABS
1.0000 | ORAL_TABLET | Freq: Once | ORAL | Status: AC
  Administered 2012-06-16: 1 via ORAL
  Filled 2012-06-16: qty 1

## 2012-06-16 MED ORDER — ALBUTEROL SULFATE (5 MG/ML) 0.5% IN NEBU
2.5000 mg | INHALATION_SOLUTION | Freq: Four times a day (QID) | RESPIRATORY_TRACT | Status: DC
  Administered 2012-06-16 – 2012-06-20 (×14): 2.5 mg via RESPIRATORY_TRACT
  Filled 2012-06-16 (×15): qty 0.5

## 2012-06-16 NOTE — Progress Notes (Addendum)
In to introduce nurse, pt sleep. Family in room states that 100mg  Tramadol Q6h isn't enough for pt and requesting something stronger. Also family discuss other concerns about sleep medicine and pain medicine regime.  NP notified via text page.   2012: Discussed pt plan/course of care with NP. Also discussed previous temperature results and that this nurse is unaware if MD's are aware. Orders received. Will update pt and family.

## 2012-06-16 NOTE — Evaluation (Signed)
Physical Therapy Evaluation Patient Details Name: Keith [name] MRN: [id number] DOB: August 13, 1940 Today's Date: 06/16/2012 Time: 7829-5621 PT Time Calculation (min): 36 min  PT Assessment / Plan / Recommendation Clinical Impression  Pt 72 yo male re-admitted for airway obstruction. Pt was functioning at supervision level now requires minA for all transfers and mobility. Pt with good home set up and support. Pt safe to d/c home with HHPT, 24/7 assist and use of RW when medically stable. Acute PT to con't to work with patient to progress ambulation and activity tolerance.    PT Assessment  Patient needs continued PT services    Follow Up Recommendations  Home health PT;Supervision/Assistance - 24 hour    Does the patient have the potential to tolerate intense rehabilitation      Barriers to Discharge None      Equipment Recommendations  None recommended by PT    Recommendations for Other Services     Frequency Min 3X/week    Precautions / Restrictions Precautions Precautions: Fall Precaution Comments: trach Restrictions Weight Bearing Restrictions: No   Pertinent Vitals/Pain Pt denies pain      Mobility  Bed Mobility Bed Mobility: Not assessed (pt up in chair) Transfers Transfers: Sit to Stand;Stand to Sit Sit to Stand: 4: Min assist;With upper extremity assist;With armrests;From chair/3-in-1 Stand to Sit: With upper extremity assist;With armrests;To chair/3-in-1;5: Supervision Details for Transfer Assistance: increased time Ambulation/Gait Ambulation/Gait Assistance: 4: Min assist Ambulation Distance (Feet): 75 Feet Assistive device: Rolling walker Ambulation/Gait Assistance Details: minA for walker management, pt with tendency to run into wall but able to correct Gait Pattern: Step-through pattern;Decreased stride length Gait velocity: decreased General Gait Details: pt with no LOB, pt c/o SOB/onset of fatigue limiting amb endurance Stairs: No Wheelchair  Mobility Wheelchair Mobility: No    Exercises General Exercises - Lower Extremity Long Arc Quad: AROM;Both;5 reps;Seated Hip Flexion/Marching: AROM;Both;5 reps;Seated   PT Diagnosis: Generalized weakness  PT Problem List: Decreased activity tolerance;Decreased balance;Decreased mobility;Decreased knowledge of use of DME PT Treatment Interventions: DME instruction;Gait training;Stair training;Functional mobility training;Therapeutic activities;Patient/family education   PT Goals Acute Rehab PT Goals PT Goal Formulation: With patient Time For Goal Achievement: 06/23/12 Potential to Achieve Goals: Good Pt will go Sit to Stand: with modified independence PT Goal: Sit to Stand - Progress: Goal set today Pt will go Stand to Sit: with modified independence PT Goal: Stand to Sit - Progress: Goal set today Pt will Stand: with modified independence;with unilateral upper extremity support PT Goal: Stand - Progress: Goal set today Pt will Ambulate: >150 feet;with modified independence;with least restrictive assistive device PT Goal: Ambulate - Progress: Goal set today Pt will Perform Home Exercise Program: Independently PT Goal: Perform Home Exercise Program - Progress: Goal set today  Visit Information  Last PT Received On: 06/16/12 Assistance Needed: +1    Subjective Data  Subjective: Pt received sitting up in chair agreeable to ambulate. Daughter present.   Prior Functioning  Home Living Lives With: Daughter Available Help at Discharge: Family;Available 24 hours/day (24/7 caregiver as wel) Type of Home: House Home Access: Stairs to enter [employer] of Steps: 1 Home Layout: One level Bathroom Shower/Tub: Engineer, manufacturing systems: Standard Bathroom Accessibility: Yes How Accessible: Accessible via walker Home Adaptive Equipment: Walker - rolling;Tub transfer bench;Hand-held shower hose Prior Function Level of Independence: Needs assistance Needs Assistance:  Bathing Bath: Minimal Able to Take Stairs?: Yes Driving: No Vocation: Retired Comments: pt has been in hospital x2 in 3 weeks, pt was walking  with walker in community and without device in home. caregiver assist with bath but pt able to dress self Communication Communication: Passy-Muir valve;Tracheostomy;No difficulties Dominant Hand: Right    Cognition  Cognition Arousal/Alertness: Awake/alert Behavior During Therapy: WFL for tasks assessed/performed Overall Cognitive Status: Within Functional Limits for tasks assessed    Extremity/Trunk Assessment Right Upper Extremity Assessment RUE ROM/Strength/Tone: [facility] for tasks assessed Left Upper Extremity Assessment LUE ROM/Strength/Tone: WFL for tasks assessed Left Lower Extremity Assessment LLE ROM/Strength/Tone: WFL for tasks assessed Trunk Assessment Trunk Assessment: Kyphotic (forward head)   Balance Static Standing Balance Static Standing - Balance Support: Bilateral upper extremity supported Static Standing - Level of Assistance: 4: Min assist  End of Session PT - End of Session Equipment Utilized During Treatment: Gait belt Activity Tolerance: Patient tolerated treatment well Patient left: in chair;with call bell/phone within reach;with family/visitor present Nurse Communication: Mobility status  GP Functional Assessment Tool Used: clinical judgement Functional Limitation: Mobility: Walking and moving around Mobility: Walking and Moving Around Current Status ([id number]): At least 20 percent but less than 40 percent impaired, limited or restricted Mobility: Walking and Moving Around Goal Status [phone]): At least 1 percent but less than 20 percent impaired, limited or restricted   Marcene Brawn 06/16/2012, 3:01 PM  Lewis Shock, PT, DPT Pager #: [phone] Office #: [phone]

## 2012-06-16 NOTE — Progress Notes (Signed)
[facility] Progress Note [place] TEAM 1 - Stepdown/ICU TEAM   Keith [name] ZOX:[id number] DOB: 07/03/1940 DOA: 06/13/2012 PCP: No primary provider on file.  Brief narrative: 72 y.o. male on tracheal collar because of recent laryngeal edema requiring emergency tracheostomy secondary to angioedema from ACE inhibitors who had subsequent cardiac arrest due to airway obstruction who re-presented to the ER because of sudden onset of difficulty breathing.  EMS was called. EMS found patient tachypneic and tachycardic and suctioning was done. In the ER chest x-ray did not show anything acute. Suctioning in the ER showed no blood. Due to patient's recent cardiac arrest due to obstructive airway as well as the recurring nature of his airway difficulties (EMS called to the home on multiple occasions, and this representing his 3rd trip back to the hospital), he was not felt to be safe to return home.   Of note, patient's notes for this admission stated that he had massive trach site hemorrhage from an artery that was injured during his tracheostomy tube downsizing. This is incorrect. As can be seen in Dr. Raye Sorrow operative note and Dr. Ellyn Hack trach change procedure note, he had a sidewall injury to his right anterior jugular VEIN from his original tracheostomy by General Surgery, NOT from his tracheostomy tube change, which was uneventful.  Assessment/Plan:  Recurrent Airway obstruction w/ episodic hypoxia -more congested today -low grade fever with TM 101 this am - now that better hydrated may be evolving into tracheo-bronchiti - WBC normal - obtain cx and CXR in am -continue trach care and when necessary saline flushes to remove the dried blood in the trachea -has experienced episodic hypoxia and airway obstruction this admit requiring urgent trach care to correct - is not felt to be safe for d/c andhome   S/p emergency Tracheostomy  -Per Dr. Emeline Darling for direct visualization of trachea  before potential removal or size change - this is planned for Thursday  Hypertension -Continue home meds  Hyperlipidemia -Continue statin  Fracture of rib of right side/Fracture, sternum closed -Continue tramadol  Atrial fibrillation -Noted on EKG on previous admission on 4/12 -He was evaluated by Dr. Melburn Popper and it was decided to forego anticoagulation due to his recent bleeding at the site of the trach -He was supposed to be discharged with a 30 day event monitor and to followup with [place] cardiology -contacted today (4/28) and made aware of admit and need to clarify if monitor ready for dc/any FU appts -Echo revealed mild LVH and mild aortic regurgitation - no other significant abnormality  Code Status: Full code Family Communication: With daughter and patient Disposition Plan: SDU  Consultants: ENT Cardiology  Procedures: None  Antibiotics: None  DVT prophylaxis: SCDs  HPI/Subjective: Patient noting increasing congestion. Discussed with dtr and pt plan to formally consult ENT since at this time not yet ready for dc  Objective: Blood pressure 129/62, pulse 91, temperature 99.3 F (37.4 C), temperature source Oral, resp. rate 23, height 5\' 7"  (1.702 m), weight 84.6 kg (186 lb 8.2 oz), SpO2 100.00%.  Intake/Output Summary (Last 24 hours) at 06/16/12 1243 Last data filed at 06/16/12 1000  Gross per 24 hour  Intake   1360 ml  Output    525 ml  Net    835 ml   Exam: General: Awake alert oriented x3, No acute respiratory distress Lungs: Mild rhonchi and upper airway wheezing. Mild cough No acute respiratory distress at present / trach in place Cardiovascular: Regular rate and rhythm without murmur  gallop or rub normal S1 and S2 Abdomen: Nontender, nondistended, soft, bowel sounds positive, no rebound, no ascites, no appreciable mass Extremities: No significant cyanosis, clubbing, or edema bilateral lower extremities  Data Reviewed: Basic Metabolic Panel:  Recent  Labs Lab 06/13/12 2326 06/14/12 0525  NA 141 140  K 3.5 4.1  CL 101 103  CO2 29 28  GLUCOSE 162* 188*  BUN 14 14  CREATININE 0.78 0.66  CALCIUM 9.7 9.6   CBC:  Recent Labs Lab 06/13/12 2326 06/14/12 0525  WBC 10.0 8.8  NEUTROABS 7.4  --   HGB 9.7* 9.3*  HCT 28.1* 26.5*  MCV 89.5 89.2  PLT 217 202    Recent Results (from the past 240 hour(s))  MRSA PCR SCREENING     Status: Abnormal   Collection Time    06/14/12  2:30 AM      Result Value Range Status   MRSA by PCR POSITIVE (*) NEGATIVE Final   Comment:            The GeneXpert MRSA Assay (FDA     approved for NASAL specimens     only), is one component of a     comprehensive MRSA colonization     surveillance program. It is not     intended to diagnose MRSA     infection nor to guide or     monitor treatment for     MRSA infections.     RESULT CALLED TO, READ BACK BY AND VERIFIED WITH:     OFORI,E RN [id number] AT [phone] SKEEN,P    Studies:  Recent x-ray studies have been reviewed in detail by the Attending Physician  Scheduled Meds:  Scheduled Meds: . albuterol  2.5 mg Nebulization Q4H  . amLODipine  10 mg Oral Daily  . atorvastatin  40 mg Oral q morning - 10a  . Chlorhexidine Gluconate Cloth  6 each Topical Q0600  . fluticasone  1 spray Each Nare Daily  . folic acid  1 mg Oral Daily  . ipratropium  2 spray Nasal QID  . metoprolol tartrate  25 mg Oral BID  . mupirocin ointment   Nasal BID  . sodium chloride  3 mL Intravenous Q12H  . thiamine  100 mg Oral Daily   Continuous Infusions: . sodium chloride Stopped (06/16/12 1000)    Time spent on care of this patient: 35 minutes   [facility] L.ANP  [facility] Office  [phone] Pager - Text Page per Loretha Stapler as per below:  On-Call/Text Page:      [email or web address]      password [email or web address]  If 7PM-7AM, please contact night-coverage [email or web address] Password [facility] 06/16/2012, 12:43 PM   LOS: 3 days   I have personally examined this patient and  reviewed the entire database. I have reviewed the above note, made any necessary editorial changes, and agree with its content.  Lonia Blood, MD [facility]

## 2012-06-16 NOTE — Consult Note (Signed)
Called for inpatient evaluation on patient for recent admission for trach mucous plug with dyspnea and desaturation. Had emergency tracheotomy by Dr. Janee Morn of Trauma Surgery for acute angioedema with airway obstruction. Admitted to stepdown. Patient is already scheduled for direct laryngoscopy with possible tracheostomy site revision vs. Decannulation by Dr. Emeline Darling on 06/19/12 at 0730.   Recommend primary team keep patient in stepdown and observe as an inpatient until surgery 06/19/12. If his laryngeal exam is sufficiently improved to allow decannulation then he can be decannulated in the OR and primary team can observe him for 1-2 days post-op to ensure he tolerates decannulation before discharge. If his laryngeal exam is not sufficiently improved to allow decannulation I will upsize him to a 6 cuffless trach and he can be discharged with outpatient follow up until he can be decannulated. His previous laryngoscopy in the office showed marked improvement of the angioedema/laryngeal edema but he had a large blood clot sitting in the glottic aperture and was uncomfortable breathing with the trach occluded. Until surgery 5/1/ can have routine trach care and suctioning and encourage patient to cough up secretions to prevent mucous plugging.  Of note, patient's notes for this admission state that he had massive trach site hemorrhage from an artery that was injured during his tracheostomy tube downsizing. This is incorrect. As can be seen in Dr. Raye Sorrow operative note and my trach change procedure note, he had a sidewall injury to his right anterior jugular VEIN from his original tracheostomy by general surgery, NOT from his tracheostomy tube change, which was uneventful.

## 2012-06-17 ENCOUNTER — Inpatient Hospital Stay (HOSPITAL_COMMUNITY): Admission: RE | Admit: 2012-06-17 | Payer: Medicare Other | Source: Ambulatory Visit

## 2012-06-17 ENCOUNTER — Inpatient Hospital Stay (HOSPITAL_COMMUNITY): Payer: Medicare Other

## 2012-06-17 DIAGNOSIS — R509 Fever, unspecified: Secondary | ICD-10-CM | POA: Diagnosis not present

## 2012-06-17 LAB — CBC
MCHC: 34.5 g/dL (ref 30.0–36.0)
Platelets: 186 10*3/uL (ref 150–400)
RDW: 14.8 % (ref 11.5–15.5)

## 2012-06-17 LAB — URINALYSIS, ROUTINE W REFLEX MICROSCOPIC
Bilirubin Urine: NEGATIVE
Glucose, UA: NEGATIVE mg/dL
Hgb urine dipstick: NEGATIVE
Ketones, ur: NEGATIVE mg/dL
Leukocytes, UA: NEGATIVE
Nitrite: NEGATIVE
Protein, ur: NEGATIVE mg/dL
Specific Gravity, Urine: 1.019 (ref 1.005–1.030)
Urobilinogen, UA: 1 mg/dL (ref 0.0–1.0)
pH: 5.5 (ref 5.0–8.0)

## 2012-06-17 LAB — PROTIME-INR
INR: 1.4 (ref 0.00–1.49)
Prothrombin Time: 16.8 seconds — ABNORMAL HIGH (ref 11.6–15.2)

## 2012-06-17 LAB — APTT: aPTT: 44 seconds — ABNORMAL HIGH (ref 24–37)

## 2012-06-17 MED ORDER — OXYCODONE-ACETAMINOPHEN 5-325 MG PO TABS
1.0000 | ORAL_TABLET | ORAL | Status: DC | PRN
  Administered 2012-06-17 – 2012-06-20 (×12): 1 via ORAL
  Filled 2012-06-17 (×12): qty 1

## 2012-06-17 MED ORDER — IBUPROFEN 400 MG PO TABS
400.0000 mg | ORAL_TABLET | Freq: Four times a day (QID) | ORAL | Status: DC | PRN
  Administered 2012-06-17: 400 mg via ORAL
  Filled 2012-06-17 (×2): qty 1

## 2012-06-17 MED ORDER — DEXTROSE 5 % IV SOLN
1.0000 g | INTRAVENOUS | Status: DC
  Filled 2012-06-17: qty 10

## 2012-06-17 MED ORDER — LIDOCAINE VISCOUS 2 % MT SOLN
20.0000 mL | Freq: Once | OROMUCOSAL | Status: DC
  Filled 2012-06-17: qty 20

## 2012-06-17 MED ORDER — LIDOCAINE HCL 2 % EX GEL
Freq: Once | CUTANEOUS | Status: DC
  Filled 2012-06-17: qty 5

## 2012-06-17 MED ORDER — DEXTROSE 5 % IV SOLN
500.0000 mg | INTRAVENOUS | Status: DC
  Administered 2012-06-17 – 2012-06-18 (×2): 500 mg via INTRAVENOUS
  Filled 2012-06-17 (×2): qty 500

## 2012-06-17 NOTE — Progress Notes (Signed)
[facility] Progress Note [place] TEAM 1 - Stepdown/ICU TEAM   ILYAS LIPSITZ ZOX:[id number] DOB: 03/08/1940 DOA: 06/13/2012 PCP: No primary provider on file.  Brief narrative: 72 y.o. male on tracheal collar because of recent laryngeal edema requiring emergency tracheostomy secondary to angioedema from ACE inhibitors who had subsequent cardiac arrest due to airway obstruction who re-presented to the ER because of sudden onset of difficulty breathing.  EMS was called. EMS found patient tachypneic and tachycardic and suctioning was done. In the ER chest x-ray did not show anything acute. Suctioning in the ER showed no blood. Due to patient's recent cardiac arrest due to obstructive airway as well as the recurring nature of his airway difficulties (EMS called to the home on multiple occasions, and this representing his 3rd trip back to the hospital), he was not felt to be safe to return home.   Of note, patient's notes for this admission stated that he had massive trach site hemorrhage from an artery that was injured during his tracheostomy tube downsizing. This is incorrect. As can be seen in Dr. Raye Sorrow operative note and Dr. Ellyn Hack trach change procedure note, he had a sidewall injury to his right anterior jugular VEIN from his original tracheostomy by General Surgery, NOT from his tracheostomy tube change, which was uneventful.  Assessment/Plan:  Recurrent Airway obstruction w/ episodic hypoxia/tracheobronchitis/fever -low grade fever with TM 102 past - now that better hydrated may be evolving into tracheo-bronchitis - WBC has increased today- sputum cx pending- CXR non focal -begin anbx's and FU cx's -continue trach care and when necessary saline flushes to remove the dried blood in the trachea -has experienced episodic hypoxia and airway obstruction this admit requiring urgent trach care to correct - is not felt to be safe for d/c to home at this time   S/p emergency  Tracheostomy  -Per Dr. Emeline Darling for direct visualization of trachea before potential removal or size change - this is planned for Thursday  Mild acute delirium -increased confusion but not severe -suspect unfamiliar surroundings and acute infection are causative  Hypertension -Continue home meds  Hyperlipidemia -Continue statin  Fracture of rib of right side/Fracture, sternum closed -Continue tramadol  Atrial fibrillation -Noted on EKG on previous admission on 4/12 -He was evaluated by Dr. Melburn Popper and it was decided to forego anticoagulation due to his recent bleeding at the site of the trach -He was discharged with a 30 day event monitor and to followup with [facility] cardiology -consulted(4/28) and made aware of admit and need to clarify if monitor ready for dc/any FU appts -Echo revealed mild LVH and mild aortic regurgitation - no other significant abnormality  Code Status: Full code Family Communication: With daughter and patient Disposition Plan: SDU  Consultants: ENT Cardiology  Procedures: None  Antibiotics: None  DVT prophylaxis: SCDs  HPI/Subjective: Patient noting stable congestion, dtr noting increasing confusion. Updated on status- all questions answered  Objective: Blood pressure 122/64, pulse 88, temperature 101.4 F (38.6 C), temperature source Oral, resp. rate 19, height 5\' 7"  (1.702 m), weight 84.6 kg (186 lb 8.2 oz), SpO2 100.00%.  Intake/Output Summary (Last 24 hours) at 06/17/12 1224 Last data filed at 06/17/12 0000  Gross per 24 hour  Intake    100 ml  Output    300 ml  Net   -200 ml   Exam: General: Awake alert oriented x2 but seems slightly more confused today, No acute respiratory distress Lungs: Coarse yer CTA posteriorly,. Mild cough, No acute respiratory distress  at present / trach in place Cardiovascular: Regular rate and rhythm without murmur gallop or rub normal S1 and S2 Abdomen: Nontender, nondistended, soft, bowel sounds positive, no  rebound, no ascites, no appreciable mass Extremities: No significant cyanosis, clubbing, or edema bilateral lower extremities  Data Reviewed: Basic Metabolic Panel:  Recent Labs Lab 06/13/12 2326 06/14/12 0525  NA 141 140  K 3.5 4.1  CL 101 103  CO2 29 28  GLUCOSE 162* 188*  BUN 14 14  CREATININE 0.78 0.66  CALCIUM 9.7 9.6   CBC:  Recent Labs Lab 06/13/12 2326 06/14/12 0525 06/17/12 0520  WBC 10.0 8.8 12.1*  NEUTROABS 7.4  --   --   HGB 9.7* 9.3* 9.8*  HCT 28.1* 26.5* 28.4*  MCV 89.5 89.2 89.6  PLT 217 202 186    Recent Results (from the past 240 hour(s))  MRSA PCR SCREENING     Status: Abnormal   Collection Time    06/14/12  2:30 AM      Result Value Range Status   MRSA by PCR POSITIVE (*) NEGATIVE Final   Comment:            The GeneXpert MRSA Assay (FDA     approved for NASAL specimens     only), is one component of a     comprehensive MRSA colonization     surveillance program. It is not     intended to diagnose MRSA     infection nor to guide or     monitor treatment for     MRSA infections.     RESULT CALLED TO, READ BACK BY AND VERIFIED WITH:     OFORI,E RN [phone] AT [phone] SKEEN,P  CULTURE, RESPIRATORY (NON-EXPECTORATED)     Status: None   Collection Time    06/16/12  3:28 PM      Result Value Range Status   Specimen Description TRACHEAL ASPIRATE   Final   Special Requests NONE   Final   Gram Stain     Final   Value: FEW WBC PRESENT, PREDOMINANTLY PMN     NO SQUAMOUS EPITHELIAL CELLS SEEN     FEW GRAM POSITIVE COCCI     IN PAIRS   Culture Culture reincubated for better growth   Final   Report Status PENDING   Incomplete    Studies:  Recent x-ray studies have been reviewed in detail by the Attending Physician  Scheduled Meds:  Scheduled Meds: . albuterol  2.5 mg Nebulization QID  . amLODipine  10 mg Oral Daily  . atorvastatin  40 mg Oral q morning - 10a  . azithromycin  500 mg Intravenous Q24H  . Chlorhexidine Gluconate Cloth  6 each  Topical Q0600  . fluticasone  1 spray Each Nare Daily  . folic acid  1 mg Oral Daily  . ipratropium  2 spray Nasal QID  . metoprolol tartrate  25 mg Oral BID  . mupirocin ointment   Nasal BID  . sodium chloride  3 mL Intravenous Q12H  . thiamine  100 mg Oral Daily   Continuous Infusions: . sodium chloride Stopped (06/16/12 1000)    Time spent on care of this patient: 35 minutes   [facility] L.ANP  [facility] Office  [phone] Pager - Text Page per Loretha Stapler as per below:  On-Call/Text Page:      [email or web address]      password [email or web address]  If 7PM-7AM, please contact night-coverage [email or web address] Password [facility] 06/17/2012, 12:24 PM   LOS: 4  days     I have examined the patient, reviewed the chart and modified the above note which I agree with.   Kameko Hukill,MD [phone] 06/17/2012, 4:51 PM

## 2012-06-17 NOTE — Progress Notes (Signed)
Physical Therapy Treatment Patient Details Name: Keith [name] MRN: [id number] DOB: [birth year] Today's Date: 06/17/2012 Time: 6578-4696 PT Time Calculation (min): 27 min  PT Assessment / Plan / Recommendation Comments on Treatment Session  Pt much moer alert during treatment today. Pt did not c/o SOB or fatigue with walking. Pt with some short term memory deficits- not recalling PT eval the day before or hand placement for transfers. Continues to need 24/7 hands on assist for d/c.    Follow Up Recommendations  Home health PT;Supervision/Assistance - 24 hour     Does the patient have the potential to tolerate intense rehabilitation     Barriers to Discharge        Equipment Recommendations  None recommended by PT    Recommendations for Other Services    Frequency Min 3X/week   Plan Discharge plan remains appropriate;Frequency remains appropriate    Precautions / Restrictions Precautions Precautions: Fall Precaution Comments: trach Restrictions Weight Bearing Restrictions: No   Pertinent Vitals/Pain 0/10    Mobility  Bed Mobility Bed Mobility: Rolling Right;Right Sidelying to Sit;Sitting - Scoot to [place] of Bed;Sit to Supine Rolling Right: 4: Min assist Right Sidelying to Sit: 4: Min assist Supine to Sit: 4: Min assist Sitting - Scoot to [place] of Bed: 4: Min assist Sit to Supine: 4: Min assist Details for Bed Mobility Assistance: Pt needs verbal cues for sequencing of bed mobility Transfers Transfers: Sit to Stand;Stand to Sit Sit to Stand: 3: Mod assist;From bed Stand to Sit: 3: Mod assist;To bed Details for Transfer Assistance: Pt needs verbal cues for hand placement for transfers and requires assistance to stand safely.  Ambulation/Gait Ambulation/Gait Assistance: 4: Min assist Ambulation/Gait: Patient Percentage: 80% Ambulation Distance (Feet): 200 Feet Assistive device: Rolling walker Ambulation/Gait Assistance Details: minA for aid in turning and for  stability when pt fatigues.  Gait Pattern: Step-through pattern;Decreased stride length Gait velocity: decreased General Gait Details: pt with no LOB and not complaining of fatigue at all during ambulation Stairs: No Wheelchair Mobility Wheelchair Mobility: No    Exercises     PT Diagnosis:    PT Problem List:   PT Treatment Interventions:     PT Goals Acute Rehab PT Goals PT Goal Formulation: With patient Time For Goal Achievement: 06/23/12 Potential to Achieve Goals: Good PT Goal: Sit to Stand - Progress: Progressing toward goal PT Goal: Stand to Sit - Progress: Progressing toward goal PT Goal: Stand - Progress: Progressing toward goal PT Goal: Ambulate - Progress: Progressing toward goal PT Goal: Perform Home Exercise Program - Progress: Progressing toward goal  Visit Information  Last PT Received On: 06/17/12 Assistance Needed: +1    Subjective Data  Subjective: Pt recieved sitting in bed agreeable to ambulation. Ex-wife present.   Cognition  Cognition Arousal/Alertness: Awake/alert Behavior During Therapy: WFL for tasks assessed/performed Overall Cognitive Status: Within Functional Limits for tasks assessed Pt with short term memory deficits evident from lack of recall of PT eval yesterday and hand placement for transfers    Balance     End of Session PT - End of Session Equipment Utilized During Treatment: Gait belt;Oxygen Activity Tolerance: Patient tolerated treatment well Patient left: in bed;with call bell/phone within reach;with family/visitor present   GP     Marvis Moeller 06/17/2012, 5:37 PM  Marvis Moeller, Student Physical Therapist Office #: [phone]

## 2012-06-17 NOTE — Progress Notes (Signed)
NP. Talked to NP about pain relief with percocet and possible schedule. Also pt 102.3 temp-tylenol given. New orders. Will continue to monitor.

## 2012-06-17 NOTE — Progress Notes (Signed)
Hops visit for tracheostomy status eval.  Family concerned for incomplete understanding of status and plans.  New fever of concern.  Agree with keeping patient in hospital pending planned laryngoscopy, possible decannulation vs trach site revision on Thursday, especially given several life threatening mishaps at home related to the tracheostomy.  Dr. Ellyn Hack plans appropriate as above.  Pt breathing comfortably.  Warm to touch.  Voice clear using Shelbie Hutching valve.  Stoma clean; no further bleeding.    Plan for OR Thursday.  Will ask for sputum culture.  Increase activity.  Incentive spirometry.  Flo Shanks MD

## 2012-06-17 NOTE — Progress Notes (Signed)
Physical Therapy Treatment Patient Details Name: Keith [name] MRN: [id number] DOB: [birth year] Today's Date: 06/17/2012 Time: 4098-1191 PT Time Calculation (min): 27 min  PT Assessment / Plan / Recommendation Comments on Treatment Session  Pt much more alert during treatment today. Pt did not c/o SOB or fatigue with walking. Pt with some short term memory deficits- not recalling PT eval the day before or hand placement for transfers. Continues to need 24/7 hands on assist for d/c.    Follow Up Recommendations  Home health PT;Supervision/Assistance - 24 hour     Does the patient have the potential to tolerate intense rehabilitation     Barriers to Discharge        Equipment Recommendations  None recommended by PT    Recommendations for Other Services    Frequency Min 3X/week   Plan Discharge plan remains appropriate;Frequency remains appropriate    Precautions / Restrictions Precautions Precautions: Fall Precaution Comments: trach Restrictions Weight Bearing Restrictions: No   Pertinent Vitals/Pain 0/10    Mobility  Bed Mobility Bed Mobility: Rolling Right;Right Sidelying to Sit;Sitting - Scoot to [place] of Bed;Sit to Supine Rolling Right: 4: Min assist Right Sidelying to Sit: 4: Min assist Supine to Sit: 4: Min assist Sitting - Scoot to [place] of Bed: 4: Min assist Sit to Supine: 4: Min assist Details for Bed Mobility Assistance: Pt needs verbal cues for sequencing of bed mobility Transfers Transfers: Sit to Stand;Stand to Sit Sit to Stand: 3: Mod assist;From bed Stand to Sit: 3: Mod assist;To bed Details for Transfer Assistance: Pt needs verbal cues for hand placement for transfers and requires assistance to stand safely.  Ambulation/Gait Ambulation/Gait Assistance: 4: Min assist Ambulation/Gait: Patient Percentage: 80% Ambulation Distance (Feet): 200 Feet Assistive device: Rolling walker Ambulation/Gait Assistance Details: minA for aid in turning and for  stability when pt fatigues.  Gait Pattern: Step-through pattern;Decreased stride length Gait velocity: decreased General Gait Details: pt with no LOB and not complaining of fatigue at all during ambulation Stairs: No Wheelchair Mobility Wheelchair Mobility: No    Exercises     PT Diagnosis:    PT Problem List:   PT Treatment Interventions:     PT Goals Acute Rehab PT Goals PT Goal Formulation: With patient Time For Goal Achievement: 06/23/12 Potential to Achieve Goals: Good PT Goal: Sit to Stand - Progress: Progressing toward goal PT Goal: Stand to Sit - Progress: Progressing toward goal PT Goal: Stand - Progress: Progressing toward goal PT Goal: Ambulate - Progress: Progressing toward goal PT Goal: Perform Home Exercise Program - Progress: Progressing toward goal  Visit Information  Last PT Received On: 06/17/12 Assistance Needed: +1    Subjective Data  Subjective: Pt recieved sitting in bed agreeable to ambulation. Ex-wife present.   Cognition  Cognition Arousal/Alertness: Awake/alert Behavior During Therapy: WFL for tasks assessed/performed Overall Cognitive Status: Within Functional Limits for tasks assessed Pt with short term memory deficits evident from lack of recall of PT eval yesterday and hand placement for transfers    Balance     End of Session PT - End of Session Equipment Utilized During Treatment: Gait belt;Oxygen Activity Tolerance: Patient tolerated treatment well Patient left: in bed;with call bell/phone within reach;with family/visitor present   GP     Marvis Moeller 06/17/2012, 5:37 PM  Marvis Moeller, Student Physical Therapist Office #: [phone]  Agree with above assessment.  Lewis Shock, PT, DPT Pager #: [phone] Office #: [phone]

## 2012-06-18 ENCOUNTER — Encounter (HOSPITAL_COMMUNITY): Payer: Self-pay | Admitting: Anesthesiology

## 2012-06-18 LAB — CBC
HCT: 27 % — ABNORMAL LOW (ref 39.0–52.0)
MCV: 90 fL (ref 78.0–100.0)
RBC: 3 MIL/uL — ABNORMAL LOW (ref 4.22–5.81)
RDW: 14.4 % (ref 11.5–15.5)
WBC: 12.9 10*3/uL — ABNORMAL HIGH (ref 4.0–10.5)

## 2012-06-18 LAB — BASIC METABOLIC PANEL
CO2: 33 mEq/L — ABNORMAL HIGH (ref 19–32)
Chloride: 96 mEq/L (ref 96–112)
Creatinine, Ser: 0.74 mg/dL (ref 0.50–1.35)

## 2012-06-18 LAB — URINE CULTURE: Special Requests: NORMAL

## 2012-06-18 MED ORDER — VANCOMYCIN HCL 10 G IV SOLR
1250.0000 mg | Freq: Two times a day (BID) | INTRAVENOUS | Status: DC
  Administered 2012-06-18 – 2012-06-19 (×2): 1250 mg via INTRAVENOUS
  Filled 2012-06-18 (×3): qty 1250

## 2012-06-18 NOTE — Care Management Note (Signed)
    Page 1 of 1   06/18/2012     2:44:05 PM   CARE MANAGEMENT NOTE 06/18/2012  Patient:  Keith [name],Keith [name]   Account Number:  [id number]  Date Initiated:  06/18/2012  Documentation initiated by:  Rasheida Broden  Subjective/Objective Assessment:   72 yr-old male adm with dx of airway obstruction; lives with daughter, active with [facility]     DC Planning Services  CM consult      Choice offered to / List presented to:  C-4 Adult Children     HH arranged  HH-1 RN  HH-2 PT  HH-3 OT  HH-4 NURSE'S AIDE  HH-6 SOCIAL WORKER      Per UR Regulation:  Reviewed for med. necessity/level of care/duration of stay  Comments:  06/18/12 1426 Karli Wickizer RN MSN BSN CCM Received request from RN to talk with pt about home health services.  Dtr, [name] [phone]) states she would like to change home health agencies and requests referral to [facility] as they are providers of his O2 equipment and trach supplies.  Requests RN, PT, OT, aide, and SW.  Provided dtr with list of home health agencies to review - referred to [facility] liaison after she reviewed list and specified referral to [facility].

## 2012-06-18 NOTE — Progress Notes (Signed)
Occupational Therapy Evaluation Patient Details Name: Keith [name] MRN: [id number] DOB: Nov 07, 1940 Today's Date: 06/18/2012 Time: 7829-5621 OT Time Calculation (min): 45 min  OT Assessment / Plan / Recommendation Clinical Impression  72 yo s/p emergency trach @ 3 weeks ago. since that time, pt with admission due to respiratory distrss and trach bleeding. Pt scheduled for surgery ?tomorrow for trach removal. PTA (initial), pt independent with all ADL and mobility. Pt appropriate wto D/C home with 24/7 S of daughter and [facility] services. Pt refuses to go to SNF. Pt will benefit from skilled OT services to facilitate D/C to home with 24/7 assistance form daughter due to below deficits.    OT Assessment  Patient needs continued OT Services    Follow Up Recommendations  Home health OT    Barriers to Discharge None    Equipment Recommendations  None recommended by OT    Recommendations for Other Services    Frequency  Min 2X/week    Precautions / Restrictions Precautions Precautions: Fall Precaution Comments: trach   Pertinent Vitals/Pain no apparent distress. Vitals stable     ADL  Eating/Feeding: Set up Where Assessed - Eating/Feeding: Chair Grooming: Minimal assistance Where Assessed - Grooming: Unsupported sitting Upper Body Bathing: Moderate assistance Where Assessed - Upper Body Bathing: Unsupported sitting Lower Body Bathing: Moderate assistance Where Assessed - Lower Body Bathing: Unsupported sit to stand Upper Body Dressing: Maximal assistance Where Assessed - Upper Body Dressing: Unsupported sitting Lower Body Dressing: Maximal assistance Where Assessed - Lower Body Dressing: Supported sit to stand Toilet Transfer: Minimal assistance Toilet Transfer Method: Sit to stand Equipment Used: Rolling walker;Gait belt Transfers/Ambulation Related to ADLs: min A ADL Comments: Will benefit from E conservation and AE.    OT Diagnosis: Generalized weakness;Acute pain  OT  Problem List: Decreased strength;Decreased activity tolerance;Decreased knowledge of use of DME or AE;Decreased knowledge of precautions;Cardiopulmonary status limiting activity;Pain OT Treatment Interventions: Self-care/ADL training;Therapeutic exercise;Energy conservation;DME and/or AE instruction;Therapeutic activities;Patient/family education   OT Goals Acute Rehab OT Goals OT Goal Formulation: With patient Time For Goal Achievement: 07/02/12 Potential to Achieve Goals: Good ADL Goals Additional ADL Goal #1: Pt/family will verbalize understanding of use of AE for LB ADL ADL Goal: Additional Goal #1 - Progress: Goal set today Additional ADL Goal #2: Pt/family will verbalize 3 E conservation techniques to use for ADL ADL Goal: Additional Goal #2 - Progress: Goal set today Arm Goals Pt Will Complete Theraband Exer: with supervision, verbal cues required/provided;to increase strength;Bilateral upper extremities;2 sets;Level 1 Theraband;Other (comment) (within pain tolerance) Arm Goal: Theraband Exercises - Progress: Goal set today  Visit Information  Last OT Received On: 06/18/12 Assistance Needed: +1    Subjective Data      Prior Functioning     Home Living Lives With: Daughter Available Help at Discharge: Family;Available 24 hours/day Type of Home: House Home Access: Stairs to enter [employer] of Steps: 1 Home Layout: One level Bathroom Shower/Tub: Engineer, manufacturing systems: Standard Bathroom Accessibility: Yes How Accessible: Accessible via walker Home Adaptive Equipment: Walker - rolling;Tub transfer bench;Hand-held shower hose Prior Function Level of Independence: Needs assistance Needs Assistance: Bathing Bath: Minimal Able to Take Stairs?: Yes Driving: No Vocation: Retired Musician: Engineer, materials;No difficulties Dominant Hand: Right         Vision/Perception     Cognition   Cognition Arousal/Alertness: Awake/alert Behavior During Therapy: WFL for tasks assessed/performed Overall Cognitive Status: Within Functional Limits for tasks assessed    Extremity/Trunk Assessment Right Upper Extremity Assessment  RUE ROM/Strength/Tone: Deficits;Due to pain (broken ribs from CPR) Left Upper Extremity Assessment LUE ROM/Strength/Tone: WFL for tasks assessed Trunk Assessment Trunk Assessment: Normal     Mobility Bed Mobility Bed Mobility: Supine to Sit Rolling Right: 4: Min assist Details for Bed Mobility Assistance: Educated daughter on least painful way for pt to get OOB on L side due to broken ribs Transfers Transfers: Sit to Stand;Stand to Sit Sit to Stand: 4: Min assist;From bed Stand to Sit: 4: Min assist;To chair/3-in-1     Exercise Other Exercises Other Exercises: given theraband to perform within pain tolerance with BUE   Balance  S   End of Session OT - End of Session Equipment Utilized During Treatment: Gait belt Activity Tolerance: Patient tolerated treatment well Patient left: in chair;with call bell/phone within reach;with family/visitor present Nurse Communication: Mobility status;Other (comment) (need to address inner cannula)  GO     Osten Janek,Keith [name] 06/18/2012, 6:24 PM [facility], OTR/L  [phone] 06/18/2012

## 2012-06-18 NOTE — Clinical Documentation Improvement (Signed)
CHANGE MENTAL STATUS DOCUMENTATION CLARIFICATION   CLINICAL DOCUMENTATION QUERIES ARE NOT PART OF THE PERMANENT MEDICAL RECORD          06/18/12  Junious Silk NP,  In an effort to better capture your patient's severity of illness, reflect appropriate length of stay and utilization of resources, a review of the patient medical record has revealed the following information:  "Mild acute delirium  -increased confusion but not severe  -suspect unfamiliar surroundings and acute infection are causative" Progress Notes signed by Calvert Cantor, MD at 06/17/2012 4:51 PM     Based on your clinical judgment, please document in the progress notes and discharge summary if a condition below provides greater specificity regarding the patient's acute delirium:   - Acute Encephalopathy, including TYPE:  Toxic, Metabolic, etc.   - Other Condition   - Unable to Clinically Determine    In responding to this query please exercise your independent judgment.    The fact that a query is asked, does not imply that any particular answer is desired or expected.   Reviewed:  07/10/12 - query disagreed.  Mathis Dad RN  Thank You,  Jerral Ralph  RN BSN CCDS Certified Clinical Documentation Specialist: Cell   [phone]  Health Information Management [place]  TO RESPOND TO THE THIS QUERY, FOLLOW THE INSTRUCTIONS BELOW:  1. If needed, update documentation for the patient's encounter via the notes activity.  2. Access this query again and click edit on the In [employer].  3. After updating, or not, click F2 to complete all highlighted (required) fields concerning your review. Select "additional documentation in the medical record" OR "no additional documentation provided".  4. Click Sign note button.  5. The deficiency will fall out of your In Basket *Please let us know if you are not able to complete this workflow by phone or e-mail (listed below).

## 2012-06-18 NOTE — Progress Notes (Signed)
ANTIBIOTIC CONSULT NOTE - INITIAL  Pharmacy Consult for vancomycin Indication: Tracheobronchitis  Allergies  Allergen Reactions  . Lisinopril Other (See Comments)    Presented with laryngeal edema - likely caused is lisinopril     Patient Measurements: Height: 5\' 7"  (170.2 cm) Weight: 186 lb 8.2 oz (84.6 kg) IBW/kg (Calculated) : 66.1   Vital Signs: Temp: 98.2 F (36.8 C) (04/30 0756) Temp src: Oral (04/30 0756) BP: 114/57 mmHg (04/30 0840) Pulse Rate: 86 (04/30 0840) Intake/Output from previous day: 04/29 0701 - 04/30 0700 In: -  Out: 352 [Urine:352] Intake/Output from this shift: Total I/O In: 250 [IV Piggyback:250] Out: -   Labs:  Recent Labs  06/17/12 0520 06/18/12 0741  WBC 12.1* 12.9*  HGB 9.8* 9.2*  PLT 186 172  CREATININE  --  0.74   Estimated Creatinine Clearance: 89.3 ml/min (by C-G formula based on Cr of 0.74). No results found for this basename: VANCOTROUGH, Leodis Binet, VANCORANDOM, GENTTROUGH, GENTPEAK, GENTRANDOM, TOBRATROUGH, TOBRAPEAK, TOBRARND, AMIKACINPEAK, AMIKACINTROU, AMIKACIN,  in the last 72 hours   Microbiology: Recent Results (from the past 720 hour(s))  MRSA PCR SCREENING     Status: None   Collection Time    05/22/12 10:21 PM      Result Value Range Status   MRSA by PCR NEGATIVE  NEGATIVE Final   Comment:            The GeneXpert MRSA Assay (FDA     approved for NASAL specimens     only), is one component of a     comprehensive MRSA colonization     surveillance program. It is not     intended to diagnose MRSA     infection nor to guide or     monitor treatment for     MRSA infections.  MRSA PCR SCREENING     Status: Abnormal   Collection Time    06/14/12  2:30 AM      Result Value Range Status   MRSA by PCR POSITIVE (*) NEGATIVE Final   Comment:            The GeneXpert MRSA Assay (FDA     approved for NASAL specimens     only), is one component of a     comprehensive MRSA colonization     surveillance program. It is  not     intended to diagnose MRSA     infection nor to guide or     monitor treatment for     MRSA infections.     RESULT CALLED TO, READ BACK BY AND VERIFIED WITH:     OFORI,E RN [phone] AT [phone] SKEEN,P  CULTURE, RESPIRATORY (NON-EXPECTORATED)     Status: None   Collection Time    06/16/12  3:28 PM      Result Value Range Status   Specimen Description TRACHEAL ASPIRATE   Final   Special Requests NONE   Final   Gram Stain     Final   Value: FEW WBC PRESENT, PREDOMINANTLY PMN     NO SQUAMOUS EPITHELIAL CELLS SEEN     FEW GRAM POSITIVE COCCI     IN PAIRS   Culture     Final   Value: ABUNDANT STAPHYLOCOCCUS AUREUS     Note: RIFAMPIN AND GENTAMICIN SHOULD NOT BE USED AS SINGLE DRUGS FOR TREATMENT OF STAPH INFECTIONS.   Report Status PENDING   Incomplete  CULTURE, BLOOD (ROUTINE X 2)     Status: None   Collection Time  06/16/12  9:01 PM      Result Value Range Status   Specimen Description BLOOD LEFT ARM   Final   Special Requests BOTTLES DRAWN AEROBIC ONLY 5CC   Final   Culture  Setup Time 06/17/2012 02:40   Final   Culture     Final   Value:        BLOOD CULTURE RECEIVED NO GROWTH TO DATE CULTURE WILL BE HELD FOR 5 DAYS BEFORE ISSUING A FINAL NEGATIVE REPORT   Report Status PENDING   Incomplete  CULTURE, BLOOD (ROUTINE X 2)     Status: None   Collection Time    06/16/12  9:01 PM      Result Value Range Status   Specimen Description BLOOD RIGHT ARM   Final   Special Requests BOTTLES DRAWN AEROBIC ONLY 5CC   Final   Culture  Setup Time 06/17/2012 02:40   Final   Culture     Final   Value:        BLOOD CULTURE RECEIVED NO GROWTH TO DATE CULTURE WILL BE HELD FOR 5 DAYS BEFORE ISSUING A FINAL NEGATIVE REPORT   Report Status PENDING   Incomplete    Medical History: Past Medical History  Diagnosis Date  . HTN (hypertension)   . Hypercholesterolemia     Medications:  Prescriptions prior to admission  Medication Sig Dispense Refill  . amLODipine (NORVASC) 10 MG tablet Take  1 tablet (10 mg total) by mouth daily.  30 tablet  0  . atorvastatin (LIPITOR) 40 MG tablet Take 40 mg by mouth every morning.      Marland Kitchen EPINEPHrine (EPIPEN) 0.3 mg/0.3 mL DEVI Inject 0.3 mLs (0.3 mg total) into the muscle once.  1 Device  0  . folic acid (FOLVITE) 1 MG tablet Take 1 tablet (1 mg total) by mouth daily.      Marland Kitchen ipratropium (ATROVENT) 0.06 % nasal spray Place 2 sprays into the nose 4 (four) times daily.      Marland Kitchen levofloxacin (LEVAQUIN) 500 MG tablet Take 1 tablet (500 mg total) by mouth daily.      . metoprolol tartrate (LOPRESSOR) 25 MG tablet Take 1 tablet (25 mg total) by mouth 2 (two) times daily.      . mometasone (NASONEX) 50 MCG/ACT nasal spray Place 2 sprays into the nose daily.      Marland Kitchen thiamine 100 MG tablet Take 1 tablet (100 mg total) by mouth daily.      . traMADol (ULTRAM) 50 MG tablet Take 1 tablet (50 mg total) by mouth every 6 (six) hours as needed for pain.  30 tablet     Scheduled:  . albuterol  2.5 mg Nebulization QID  . amLODipine  10 mg Oral Daily  . atorvastatin  40 mg Oral q morning - 10a  . Chlorhexidine Gluconate Cloth  6 each Topical Q0600  . fluticasone  1 spray Each Nare Daily  . folic acid  1 mg Oral Daily  . ipratropium  2 spray Nasal QID  . lidocaine  20 mL Mouth/Throat Once  . metoprolol tartrate  25 mg Oral BID  . mupirocin ointment   Nasal BID  . sodium chloride  3 mL Intravenous Q12H  . thiamine  100 mg Oral Daily  . [DISCONTINUED] azithromycin  500 mg Intravenous Q24H  . [DISCONTINUED] lidocaine   Tracheal Tube Once   Assessment: 72 yo who was admitted for airway obstruction. He has a trach collar due to recent angioedema from  ACEI. Trach culture is growing abundant SA. Empiric vanc has been ordered  Goal of Therapy:  Vancomycin trough level 15-20 mcg/ml  Plan:  Vanc 1250mg  IV q12 F/u with trough if needed  Clide Cliff 06/18/2012,11:00 AM

## 2012-06-18 NOTE — Progress Notes (Signed)
[facility] Progress Note [place] TEAM 1 - Stepdown/ICU TEAM   Keith [name] QIO:[id number] DOB: [birth year] DOA: 06/13/2012 PCP: No primary provider on file.  Brief narrative: 72 y.o. male on trach collar because of recent laryngeal edema requiring emergency tracheostomy secondary to angioedema from ACE inhibitors who had subsequent cardiac arrest due to airway obstruction who re-presented to the ER because of sudden onset of difficulty breathing.  EMS was called. EMS found patient tachypneic and tachycardic and suctioning was done. In the ER chest x-ray did not show anything acute. Suctioning in the ER showed no blood. Due to patient's recent cardiac arrest due to obstructive airway as well as the recurring nature of his airway difficulties (EMS called to the home on multiple occasions, and this representing his 3rd trip back to the hospital), he was not felt to be safe to return home.   Of note, patient's notes for this admission stated that he had trach site hemorrhage from an artery that was injured during his tracheostomy tube downsizing. This is incorrect. As can be seen in Dr. Raye Sorrow operative note and Dr. Ellyn Hack trach change procedure note, he had a sidewall injury to his right anterior jugular VEIN from his original tracheostomy by General Surgery, NOT from his tracheostomy tube change, which was uneventful.  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 Emeline Darling for direct visualization of trachea before potential  removal or size change in OR Thursday 5/1 - will likely require at least 24 hours of inpatient care postop   Mild acute delirium -resolved after defervesced -suspect unfamiliar surroundings and acute infection/fever are causative  Hypertension -Continue home meds  Hyperlipidemia -Continue statin  Fracture of rib of right side/Fracture, sternum closed -Continue tramadol  Atrial fibrillation -Noted on EKG on previous admission on 4/12 -He was evaluated by Dr. Melburn Popper and it was decided to forego anticoagulation due to his recent bleeding at the site of the trach -He was discharged with a 30 day event monitor and to followup with [facility] cardiology -consulted(4/28) and made aware of admit and need to clarify if monitor ready for dc/any FU appts -Echo revealed mild LVH and mild aortic regurgitation - no other significant abnormality  Code Status: Full code Family Communication: With daughter and patient Disposition Plan: SDU  Consultants: ENT Cardiology  Procedures: None  Antibiotics: Zithromax 4/29 >>> 4/30 Vancomycin 4/30 >>>  DVT prophylaxis: SCDs  HPI/Subjective: Patient markedly improved today regarding mental status and states less productive sputum through trach.  Objective: Blood pressure 109/55, pulse 73, temperature 98.3 F (36.8 C), temperature source Oral, resp. rate 14, height 5\' 7"  (1.702 m), weight 84.6 kg (186 lb 8.2 oz), SpO2 100.00%.  Intake/Output Summary (Last 24 hours) at 06/18/12 1203 Last data filed at 06/18/12 0805  Gross per 24 hour  Intake    250 ml  Output      1 ml  Net    249 ml   Exam: General: Awake alert oriented x3 - No acute respiratory distress Lungs: Coarse yet CTA posteriorly, No acute respiratory distress at  present / trach in place Cardiovascular: Regular rate and rhythm without murmur gallop or rub normal S1 and S2 Abdomen: Nontender, nondistended, soft, bowel sounds positive, no rebound, no ascites, no appreciable  mass Extremities: No significant cyanosis, clubbing, or edema bilateral lower extremities  Data Reviewed: Basic Metabolic Panel:  Recent Labs Lab 06/13/12 2326 06/14/12 0525 06/18/12 0741  NA 141 140 134*  K 3.5 4.1 3.8  CL 101 103 96  CO2 29 28 33*  GLUCOSE 162* 188* 150*  BUN 14 14 17   CREATININE 0.78 0.66 0.74  CALCIUM 9.7 9.6 9.4   CBC:  Recent Labs Lab 06/13/12 2326 06/14/12 0525 06/17/12 0520 06/18/12 0741  WBC 10.0 8.8 12.1* 12.9*  NEUTROABS 7.4  --   --   --   HGB 9.7* 9.3* 9.8* 9.2*  HCT 28.1* 26.5* 28.4* 27.0*  MCV 89.5 89.2 89.6 90.0  PLT 217 202 186 172    Recent Results (from the past 240 hour(s))  MRSA PCR SCREENING     Status: Abnormal   Collection Time    06/14/12  2:30 AM      Result Value Range Status   MRSA by PCR POSITIVE (*) NEGATIVE Final   Comment:            The GeneXpert MRSA Assay (FDA     approved for NASAL specimens     only), is one component of a     comprehensive MRSA colonization     surveillance program. It is not     intended to diagnose MRSA     infection nor to guide or     monitor treatment for     MRSA infections.     RESULT CALLED TO, READ BACK BY AND VERIFIED WITH:     OFORI,E RN [phone] AT [phone] SKEEN,P  CULTURE, RESPIRATORY (NON-EXPECTORATED)     Status: None   Collection Time    06/16/12  3:28 PM      Result Value Range Status   Specimen Description TRACHEAL ASPIRATE   Final   Special Requests NONE   Final   Gram Stain     Final   Value: FEW WBC PRESENT, PREDOMINANTLY PMN     NO SQUAMOUS EPITHELIAL CELLS SEEN     FEW GRAM POSITIVE COCCI     IN PAIRS   Culture     Final   Value: ABUNDANT STAPHYLOCOCCUS AUREUS     Note: RIFAMPIN AND GENTAMICIN SHOULD NOT BE USED AS SINGLE DRUGS FOR TREATMENT OF STAPH INFECTIONS.   Report Status PENDING   Incomplete  CULTURE, BLOOD (ROUTINE X 2)     Status: None   Collection Time    06/16/12  9:01 PM      Result Value Range Status   Specimen Description BLOOD LEFT ARM    Final   Special Requests BOTTLES DRAWN AEROBIC ONLY 5CC   Final   Culture  Setup Time 06/17/2012 02:40   Final   Culture     Final   Value:        BLOOD CULTURE RECEIVED NO GROWTH TO DATE CULTURE WILL BE HELD FOR 5 DAYS BEFORE ISSUING A FINAL NEGATIVE REPORT   Report Status PENDING   Incomplete  CULTURE, BLOOD (ROUTINE X 2)     Status: None   Collection Time    06/16/12  9:01 PM      Result Value Range Status   Specimen Description BLOOD RIGHT ARM   Final   Special Requests BOTTLES  DRAWN AEROBIC ONLY 5CC   Final   Culture  Setup Time 06/17/2012 02:40   Final   Culture     Final   Value:        BLOOD CULTURE RECEIVED NO GROWTH TO DATE CULTURE WILL BE HELD FOR 5 DAYS BEFORE ISSUING A FINAL NEGATIVE REPORT   Report Status PENDING   Incomplete    Studies:  Recent x-ray studies have been reviewed in detail by the Attending Physician  Scheduled Meds:  Scheduled Meds: . albuterol  2.5 mg Nebulization QID  . amLODipine  10 mg Oral Daily  . atorvastatin  40 mg Oral q morning - 10a  . Chlorhexidine Gluconate Cloth  6 each Topical Q0600  . fluticasone  1 spray Each Nare Daily  . folic acid  1 mg Oral Daily  . ipratropium  2 spray Nasal QID  . lidocaine  20 mL Mouth/Throat Once  . metoprolol tartrate  25 mg Oral BID  . mupirocin ointment   Nasal BID  . sodium chloride  3 mL Intravenous Q12H  . thiamine  100 mg Oral Daily  . vancomycin  1,250 mg Intravenous Q12H   Continuous Infusions: . sodium chloride Stopped (06/16/12 1000)    Time spent on care of this patient: 25 minutes   [facility]  [facility] Office  [phone] Pager - Text Page per Loretha Stapler as per below:  On-Call/Text Page:      [email or web address]      password [email or web address]  If 7PM-7AM, please contact night-coverage [email or web address] Password [email or web address] 06/18/2012, 12:03 PM   LOS: 5 days   I have personally examined this patient and reviewed the entire database. I have reviewed the above note, made any necessary  editorial changes, and agree with its content.  Lonia Blood, MD [facility]

## 2012-06-19 ENCOUNTER — Encounter (HOSPITAL_COMMUNITY): Payer: Self-pay | Admitting: Anesthesiology

## 2012-06-19 ENCOUNTER — Encounter (HOSPITAL_COMMUNITY)
Admission: EM | Disposition: A | Discharge status: Home Health | Payer: Self-pay | Source: Home / Self Care | Attending: Internal Medicine

## 2012-06-19 ENCOUNTER — Inpatient Hospital Stay (HOSPITAL_COMMUNITY): Payer: Medicare Other | Admitting: Anesthesiology

## 2012-06-19 ENCOUNTER — Ambulatory Visit (HOSPITAL_COMMUNITY): Admission: RE | Admit: 2012-06-19 | Payer: Medicare Other | Source: Ambulatory Visit | Admitting: Otolaryngology

## 2012-06-19 HISTORY — PX: TRACHEOSTOMY REVISION: SHX6133

## 2012-06-19 HISTORY — PX: LARYNGOSCOPY: SHX5203

## 2012-06-19 LAB — BASIC METABOLIC PANEL
BUN: 11 mg/dL (ref 6–23)
Calcium: 9.1 mg/dL (ref 8.4–10.5)
Creatinine, Ser: 0.67 mg/dL (ref 0.50–1.35)
GFR calc Af Amer: >90 mL/min (ref >90)
GFR calc non Af Amer: >90 mL/min (ref >90)

## 2012-06-19 LAB — CBC
HCT: 27 % — ABNORMAL LOW (ref 39.0–52.0)
MCHC: 33.3 g/dL (ref 30.0–36.0)
MCV: 90.3 fL (ref 78.0–100.0)
RDW: 14.6 % (ref 11.5–15.5)

## 2012-06-19 LAB — CULTURE, RESPIRATORY W GRAM STAIN

## 2012-06-19 SURGERY — LARYNGOSCOPY
Anesthesia: General | Site: Throat | Wound class: Clean Contaminated

## 2012-06-19 MED ORDER — LIDOCAINE-EPINEPHRINE 1 %-1:100000 IJ SOLN: INTRAMUSCULAR | Status: DC | PRN

## 2012-06-19 MED ORDER — ONDANSETRON HCL 4 MG/2ML IJ SOLN: 4.0000 mg | Freq: Once | INTRAMUSCULAR | Status: DC | PRN

## 2012-06-19 MED ORDER — ARTIFICIAL TEARS OP OINT
TOPICAL_OINTMENT | OPHTHALMIC | Status: DC | PRN
  Administered 2012-06-19: 1 via OPHTHALMIC

## 2012-06-19 MED ORDER — LIDOCAINE HCL (CARDIAC) 20 MG/ML IV SOLN
INTRAVENOUS | Status: DC | PRN
  Administered 2012-06-19: 60 mg via INTRAVENOUS

## 2012-06-19 MED ORDER — ONDANSETRON HCL 4 MG/2ML IJ SOLN
INTRAMUSCULAR | Status: DC | PRN
  Administered 2012-06-19: 4 mg via INTRAVENOUS

## 2012-06-19 MED ORDER — FENTANYL CITRATE 0.05 MG/ML IJ SOLN
INTRAMUSCULAR | Status: DC | PRN
  Administered 2012-06-19: 100 ug via INTRAVENOUS

## 2012-06-19 MED ORDER — SILVER NITRATE-POT NITRATE 75-25 % EX MISC
3.0000 | CUTANEOUS | Status: AC
  Administered 2012-06-19: 3 via TOPICAL
  Filled 2012-06-19: qty 3

## 2012-06-19 MED ORDER — LACTATED RINGERS IV SOLN
INTRAVENOUS | Status: DC | PRN
  Administered 2012-06-19: 07:00:00 via INTRAVENOUS

## 2012-06-19 MED ORDER — HYDROMORPHONE HCL PF 1 MG/ML IJ SOLN: 0.2500 mg | INTRAMUSCULAR | Status: DC | PRN

## 2012-06-19 MED ORDER — LEVOFLOXACIN 750 MG PO TABS
750.0000 mg | ORAL_TABLET | Freq: Every day | ORAL | Status: DC
  Administered 2012-06-19 – 2012-06-20 (×2): 750 mg via ORAL
  Filled 2012-06-19 (×2): qty 1

## 2012-06-19 MED ORDER — DEXAMETHASONE SODIUM PHOSPHATE 10 MG/ML IJ SOLN
10.0000 mg | Freq: Once | INTRAMUSCULAR | Status: DC
  Filled 2012-06-19: qty 1

## 2012-06-19 MED ORDER — PROPOFOL 10 MG/ML IV BOLUS
INTRAVENOUS | Status: DC | PRN
  Administered 2012-06-19: 100 mg via INTRAVENOUS

## 2012-06-19 MED ORDER — SODIUM CHLORIDE 0.9 % IR SOLN
Status: DC | PRN
  Administered 2012-06-19: 1

## 2012-06-19 MED ORDER — SUCCINYLCHOLINE CHLORIDE 20 MG/ML IJ SOLN
INTRAMUSCULAR | Status: DC | PRN
  Administered 2012-06-19: 100 mg via INTRAVENOUS

## 2012-06-19 MED ORDER — LIDOCAINE-EPINEPHRINE 1 %-1:100000 IJ SOLN
INTRAMUSCULAR | Status: AC
  Filled 2012-06-19: qty 1

## 2012-06-19 MED ORDER — CLINDAMYCIN PHOSPHATE 600 MG/50ML IV SOLN
600.0000 mg | Freq: Once | INTRAVENOUS | Status: AC
  Administered 2012-06-19: 600 mg via INTRAVENOUS
  Filled 2012-06-19: qty 50

## 2012-06-19 MED ORDER — EPINEPHRINE HCL (NASAL) 0.1 % NA SOLN
NASAL | Status: AC
  Filled 2012-06-19: qty 30

## 2012-06-19 MED ORDER — DEXAMETHASONE SODIUM PHOSPHATE 4 MG/ML IJ SOLN
INTRAMUSCULAR | Status: DC | PRN
  Administered 2012-06-19: 10 mg via INTRAVENOUS

## 2012-06-19 SURGICAL SUPPLY — 55 items
BALLN PULM 15 16.5 18X75 (BALLOONS)
BALLOON PULM 15 16.5 18X75 (BALLOONS) IMPLANT
BLADE SURG 11 STRL SS (BLADE) IMPLANT
BLADE SURG 15 STRL LF DISP TIS (BLADE) ×1 IMPLANT
BLADE SURG 15 STRL SS (BLADE) ×2
BLADE SURG ROTATE 9660 (MISCELLANEOUS) IMPLANT
CANISTER SUCTION 2500CC (MISCELLANEOUS) ×2 IMPLANT
CLEANER TIP ELECTROSURG 2X2 (MISCELLANEOUS) ×2 IMPLANT
CLOTH BEACON ORANGE TIMEOUT ST (SAFETY) ×2 IMPLANT
CONT SPEC 4OZ CLIKSEAL STRL BL (MISCELLANEOUS) IMPLANT
COVER SURGICAL LIGHT HANDLE (MISCELLANEOUS) ×2 IMPLANT
COVER TABLE BACK 60X90 (DRAPES) ×2 IMPLANT
DECANTER SPIKE VIAL GLASS SM (MISCELLANEOUS) ×2 IMPLANT
DRAPE PROXIMA HALF (DRAPES) ×2 IMPLANT
ELECT COATED BLADE 2.86 ST (ELECTRODE) ×2 IMPLANT
ELECT REM PT RETURN 9FT ADLT (ELECTROSURGICAL) ×2
ELECTRODE REM PT RTRN 9FT ADLT (ELECTROSURGICAL) ×1 IMPLANT
GAUZE SPONGE 4X4 16PLY XRAY LF (GAUZE/BANDAGES/DRESSINGS) ×2 IMPLANT
GAUZE XEROFORM 5X9 LF (GAUZE/BANDAGES/DRESSINGS) ×1 IMPLANT
GLOVE BIOGEL PI IND STRL 7.0 (GLOVE) IMPLANT
GLOVE BIOGEL PI INDICATOR 7.0 (GLOVE) ×1
GLOVE SURG SS PI 7.5 STRL IVOR (GLOVE) ×4 IMPLANT
GOWN ISOLATION IMPERV (GOWNS) ×1 IMPLANT
GOWN STRL NON-REIN LRG LVL3 (GOWN DISPOSABLE) ×6 IMPLANT
HOLDER TRACH TUBE VELCRO 19.5 (MISCELLANEOUS) IMPLANT
KIT BASIN OR (CUSTOM PROCEDURE TRAY) ×2 IMPLANT
KIT ROOM TURNOVER OR (KITS) ×2 IMPLANT
KIT SUCTION CATH 14FR (SUCTIONS) IMPLANT
MARKER SKIN DUAL TIP RULER LAB (MISCELLANEOUS) ×1 IMPLANT
NDL HYPO 25GX1X1/2 BEV (NEEDLE) ×1 IMPLANT
NEEDLE HYPO 25GX1X1/2 BEV (NEEDLE) ×2 IMPLANT
NS IRRIG 1000ML POUR BTL (IV SOLUTION) ×2 IMPLANT
PACK EENT II TURBAN DRAPE (CUSTOM PROCEDURE TRAY) ×2 IMPLANT
PAD ARMBOARD 7.5X6 YLW CONV (MISCELLANEOUS) ×4 IMPLANT
PATTIES SURGICAL .5 X1 (DISPOSABLE) IMPLANT
PENCIL BUTTON HOLSTER BLD 10FT (ELECTRODE) ×2 IMPLANT
SOLUTION ANTI FOG 6CC (MISCELLANEOUS) ×2 IMPLANT
SPONGE GAUZE 4X4 12PLY (GAUZE/BANDAGES/DRESSINGS) ×2 IMPLANT
SPONGE INTESTINAL PEANUT (DISPOSABLE) IMPLANT
SURGILUBE 2OZ TUBE FLIPTOP (MISCELLANEOUS) IMPLANT
SUT CHROMIC 2 0 SH (SUTURE) ×2 IMPLANT
SUT ETHILON 2 0 FS 18 (SUTURE) ×4 IMPLANT
SUT SILK 2 0 FS (SUTURE) ×2 IMPLANT
SUT SILK 3 0 REEL (SUTURE) ×2 IMPLANT
SYR 20CC LL (SYRINGE) ×2 IMPLANT
SYR BULB IRRIGATION 50ML (SYRINGE) IMPLANT
SYR CONTROL 10ML LL (SYRINGE) ×2 IMPLANT
SYR INFLATE BILIARY GAUGE (MISCELLANEOUS) IMPLANT
TAPE CLOTH SURG 4X10 WHT LF (GAUZE/BANDAGES/DRESSINGS) ×1 IMPLANT
TAPE SURG TRANSPORE 1 IN (GAUZE/BANDAGES/DRESSINGS) IMPLANT
TAPE SURGICAL TRANSPORE 1 IN (GAUZE/BANDAGES/DRESSINGS) ×1
TOWEL OR 17X24 6PK STRL BLUE (TOWEL DISPOSABLE) ×4 IMPLANT
TOWEL OR 17X26 10 PK STRL BLUE (TOWEL DISPOSABLE) ×2 IMPLANT
TUBE CONNECTING 12X1/4 (SUCTIONS) ×2 IMPLANT
WATER STERILE IRR 1000ML POUR (IV SOLUTION) ×2 IMPLANT

## 2012-06-19 NOTE — Anesthesia Postprocedure Evaluation (Signed)
  Anesthesia Post-op Note  Patient: Keith [name]  Procedure(s) Performed: Procedure(s) with comments: DIRECT LARYNGOSCOPY AND BRONCHOSCOPY (N/A) TRACHEOSTOMY DECANNULATION (N/A) - MICROSCOPE/TELESCOPE TRACHEOSTOMY REVISION  Patient Location: PACU  Anesthesia Type:General  Level of Consciousness: awake, oriented and patient cooperative  Airway and Oxygen Therapy: Patient Spontanous Breathing  Post-op Pain: none  Post-op Assessment: Post-op Vital signs reviewed, Patient's Cardiovascular Status Stable, Respiratory Function Stable, Patent Airway, No signs of Nausea or vomiting and Pain level controlled  Post-op Vital Signs: stable  Complications: No apparent anesthesia complications

## 2012-06-19 NOTE — Progress Notes (Signed)
Subjective: POD#0 from direct laryngoscopy and bronchoscopy with decannulation for history of emergency tracheotomy for obstructive angioedema. Stable sats 99% on room air when seen, breathing comfortably with no stridor, stertor, or retractions.  Objective: Vital signs in last 24 hours: Temp:  [97.2 F (36.2 C)-98.7 F (37.1 C)] 97.2 F (36.2 C) (05/01 0945) Pulse Rate:  [66-96] 82 (05/01 0945) Resp:  [9-26] 11 (05/01 0945) BP: (101-143)/(55-71) 134/63 mmHg (05/01 0935) SpO2:  [95 %-100 %] 98 % (05/01 0945) FiO2 (%):  [28 %] 28 % (05/01 0706) Weight:  [85 kg (187 lb 6.3 oz)] 85 kg (187 lb 6.3 oz) (05/01 0407)  Neck flat with occlusive dressing clean, dry, and intact over trach site. Somewhat rough, mildly breathy voice but no stridor or stertor and breathing comfortably and phonating well. CN 2-12 intact and symmetric, EOMI, PERRLA.  @LABLAST2 (wbc:2,hgb:2,hct:2,plt:2)  Recent Labs  06/18/12 0741 06/19/12 0435  NA 134* 138  K 3.8 3.8  CL 96 98  CO2 33* 34*  GLUCOSE 150* 98  BUN 17 11  CREATININE 0.74 0.67  CALCIUM 9.4 9.1    Medications:  Scheduled Meds: . albuterol  2.5 mg Nebulization QID  . amLODipine  10 mg Oral Daily  . atorvastatin  40 mg Oral q morning - 10a  . Chlorhexidine Gluconate Cloth  6 each Topical Q0600  . dexamethasone  10 mg Intravenous Once  . fluticasone  1 spray Each Nare Daily  . folic acid  1 mg Oral Daily  . ipratropium  2 spray Nasal QID  . levofloxacin  750 mg Oral Daily  . lidocaine  20 mL Mouth/Throat Once  . metoprolol tartrate  25 mg Oral BID  . mupirocin ointment   Nasal BID  . sodium chloride  3 mL Intravenous Q12H  . thiamine  100 mg Oral Daily   Continuous Infusions: . sodium chloride Stopped (06/16/12 1000)   PRN Meds:.acetaminophen, acetaminophen, albuterol, ibuprofen, LORazepam, menthol-cetylpyridinium, ondansetron (ZOFRAN) IV, ondansetron, oxyCODONE-acetaminophen, traMADol, zolpidem  Assessment/Plan: Stable s/p direct  laryngoscopy and bronchoscopy with decannulation. Tracheal and laryngeal exam was essentially normal on endoscopic exam so patient should be stable without trach. Would recommend primary team monitor in stepdown for 24-48 hours and if stable sat's and breathing comfortably can be discharged from ENT standpoint.can change occlusive dressing once daily and as needed with large vaseline or xeroform gauze, then 4x4 gauze, then tape. Trach site should heal by secondary intention and patient can follow up with ENT as needed. Once trach site has granulated in/closed can stop occlusive dressings.   LOS: 6 days   Keith [name] 06/19/2012, 10:52 AM

## 2012-06-19 NOTE — Progress Notes (Signed)
ANTIBIOTIC CONSULT NOTE - INITIAL  Pharmacy Consult for levaquin Indication: Tracheobronchitis  Allergies  Allergen Reactions  . Lisinopril Other (See Comments)    Presented with laryngeal edema - likely caused is lisinopril     Patient Measurements: Height: 5\' 7"  (170.2 cm) Weight: 187 lb 6.3 oz (85 kg) IBW/kg (Calculated) : 66.1   Vital Signs: Temp: 97.2 F (36.2 C) (05/01 0945) Temp src: Axillary (05/01 0407) BP: 134/63 mmHg (05/01 0935) Pulse Rate: 82 (05/01 0945) Intake/Output from previous day: 04/30 0701 - 05/01 0700 In: 1230 [P.O.:480; IV Piggyback:750] Out: 502 [Urine:501; Stool:1] Intake/Output from this shift: Total I/O In: 800 [I.V.:800] Out: 1 [Blood:1]  Labs:  Recent Labs  06/17/12 0520 06/18/12 0741 06/19/12 0435  WBC 12.1* 12.9* 12.3*  HGB 9.8* 9.2* 9.0*  PLT 186 172 196  CREATININE  --  0.74 0.67   Estimated Creatinine Clearance: 89.6 ml/min (by C-G formula based on Cr of 0.67). No results found for this basename: VANCOTROUGH, Leodis Binet, VANCORANDOM, GENTTROUGH, GENTPEAK, GENTRANDOM, TOBRATROUGH, TOBRAPEAK, TOBRARND, AMIKACINPEAK, AMIKACINTROU, AMIKACIN,  in the last 72 hours   Microbiology: Recent Results (from the past 720 hour(s))  MRSA PCR SCREENING     Status: None   Collection Time    05/22/12 10:21 PM      Result Value Range Status   MRSA by PCR NEGATIVE  NEGATIVE Final   Comment:            The GeneXpert MRSA Assay (FDA     approved for NASAL specimens     only), is one component of a     comprehensive MRSA colonization     surveillance program. It is not     intended to diagnose MRSA     infection nor to guide or     monitor treatment for     MRSA infections.  MRSA PCR SCREENING     Status: Abnormal   Collection Time    06/14/12  2:30 AM      Result Value Range Status   MRSA by PCR POSITIVE (*) NEGATIVE Final   Comment:            The GeneXpert MRSA Assay (FDA     approved for NASAL specimens     only), is one component  of a     comprehensive MRSA colonization     surveillance program. It is not     intended to diagnose MRSA     infection nor to guide or     monitor treatment for     MRSA infections.     RESULT CALLED TO, READ BACK BY AND VERIFIED WITH:     OFORI,E RN [phone] AT [phone] SKEEN,P  CULTURE, RESPIRATORY (NON-EXPECTORATED)     Status: None   Collection Time    06/16/12  3:28 PM      Result Value Range Status   Specimen Description TRACHEAL ASPIRATE   Final   Special Requests NONE   Final   Gram Stain     Final   Value: FEW WBC PRESENT, PREDOMINANTLY PMN     NO SQUAMOUS EPITHELIAL CELLS SEEN     FEW GRAM POSITIVE COCCI     IN PAIRS   Culture     Final   Value: ABUNDANT STAPHYLOCOCCUS AUREUS     Note: RIFAMPIN AND GENTAMICIN SHOULD NOT BE USED AS SINGLE DRUGS FOR TREATMENT OF STAPH INFECTIONS.   Report Status 06/19/2012 FINAL   Final   Organism ID, Bacteria STAPHYLOCOCCUS AUREUS  Final  CULTURE, BLOOD (ROUTINE X 2)     Status: None   Collection Time    06/16/12  9:01 PM      Result Value Range Status   Specimen Description BLOOD LEFT ARM   Final   Special Requests BOTTLES DRAWN AEROBIC ONLY 5CC   Final   Culture  Setup Time 06/17/2012 02:40   Final   Culture     Final   Value:        BLOOD CULTURE RECEIVED NO GROWTH TO DATE CULTURE WILL BE HELD FOR 5 DAYS BEFORE ISSUING A FINAL NEGATIVE REPORT   Report Status PENDING   Incomplete  CULTURE, BLOOD (ROUTINE X 2)     Status: None   Collection Time    06/16/12  9:01 PM      Result Value Range Status   Specimen Description BLOOD RIGHT ARM   Final   Special Requests BOTTLES DRAWN AEROBIC ONLY 5CC   Final   Culture  Setup Time 06/17/2012 02:40   Final   Culture     Final   Value:        BLOOD CULTURE RECEIVED NO GROWTH TO DATE CULTURE WILL BE HELD FOR 5 DAYS BEFORE ISSUING A FINAL NEGATIVE REPORT   Report Status PENDING   Incomplete  URINE CULTURE     Status: None   Collection Time    06/17/12 10:08 AM      Result Value Range Status    Specimen Description URINE, RANDOM   Final   Special Requests Normal   Final   Culture  Setup Time 06/17/2012 11:48   Final   Colony Count NO GROWTH   Final   Culture NO GROWTH   Final   Report Status 06/18/2012 FINAL   Final    Medical History: Past Medical History  Diagnosis Date  . HTN (hypertension)   . Hypercholesterolemia     Medications:  Prescriptions prior to admission  Medication Sig Dispense Refill  . amLODipine (NORVASC) 10 MG tablet Take 1 tablet (10 mg total) by mouth daily.  30 tablet  0  . atorvastatin (LIPITOR) 40 MG tablet Take 40 mg by mouth every morning.      Marland Kitchen EPINEPHrine (EPIPEN) 0.3 mg/0.3 mL DEVI Inject 0.3 mLs (0.3 mg total) into the muscle once.  1 Device  0  . folic acid (FOLVITE) 1 MG tablet Take 1 tablet (1 mg total) by mouth daily.      Marland Kitchen ipratropium (ATROVENT) 0.06 % nasal spray Place 2 sprays into the nose 4 (four) times daily.      Marland Kitchen levofloxacin (LEVAQUIN) 500 MG tablet Take 1 tablet (500 mg total) by mouth daily.      . metoprolol tartrate (LOPRESSOR) 25 MG tablet Take 1 tablet (25 mg total) by mouth 2 (two) times daily.      . mometasone (NASONEX) 50 MCG/ACT nasal spray Place 2 sprays into the nose daily.      Marland Kitchen thiamine 100 MG tablet Take 1 tablet (100 mg total) by mouth daily.      . traMADol (ULTRAM) 50 MG tablet Take 1 tablet (50 mg total) by mouth every 6 (six) hours as needed for pain.  30 tablet     Scheduled:  . albuterol  2.5 mg Nebulization QID  . amLODipine  10 mg Oral Daily  . atorvastatin  40 mg Oral q morning - 10a  . Chlorhexidine Gluconate Cloth  6 each Topical Q0600  . [COMPLETED] clindamycin (CLEOCIN)  IV  600 mg Intravenous Once  . dexamethasone  10 mg Intravenous Once  . fluticasone  1 spray Each Nare Daily  . folic acid  1 mg Oral Daily  . ipratropium  2 spray Nasal QID  . levofloxacin  750 mg Oral Daily  . lidocaine  20 mL Mouth/Throat Once  . metoprolol tartrate  25 mg Oral BID  . mupirocin ointment   Nasal BID  .  [COMPLETED] silver nitrate applicators  3 Stick Topical To OR  . sodium chloride  3 mL Intravenous Q12H  . thiamine  100 mg Oral Daily  . [DISCONTINUED] vancomycin  1,250 mg Intravenous Q12H   Assessment: 72 yo who was admitted for airway obstruction. He has a trach collar due to recent angioedema from ACEI. Trach culture is growing abundant SA. Staph came back as MSSA. Order to change vanc to levaquin. Quinolones are not the drug of choice for MSSA. Beta lactams work much better.   Plan:   Levaquin 750mg  qday Consider changing to keflex

## 2012-06-19 NOTE — Transfer of Care (Signed)
Immediate Anesthesia Transfer of Care Note  Patient: Keith [name]  Procedure(s) Performed: Procedure(s) with comments: DIRECT LARYNGOSCOPY AND BRONCHOSCOPY (N/A) TRACHEOSTOMY DECANNULATION (N/A) - MICROSCOPE/TELESCOPE TRACHEOSTOMY REVISION  Patient Location: PACU  Anesthesia Type:General  Level of Consciousness: awake, alert  and oriented  Airway & Oxygen Therapy: Patient Spontanous Breathing and Patient connected to face mask oxygen  Post-op Assessment: Report given to PACU RN  Post vital signs: Reviewed and stable  Complications: No apparent anesthesia complications

## 2012-06-19 NOTE — Preoperative (Signed)
Beta Blockers   Reason not to administer Beta Blockers:Not Applicable 

## 2012-06-19 NOTE — Progress Notes (Signed)
Humidified oxygen bottle replaced.

## 2012-06-19 NOTE — Progress Notes (Signed)
[facility] Progress Note [place] TEAM 1 - Stepdown/ICU TEAM   Keith [name]:[id number] DOB: November 03, 1940 DOA: 06/13/2012 PCP: No primary provider on file.  Brief narrative: 72 y.o. male on trach collar because of recent laryngeal edema requiring emergency tracheostomy secondary to angioedema from ACE inhibitors who had subsequent cardiac arrest due to airway obstruction who re-presented to the ER because of sudden onset of difficulty breathing.  EMS was called. EMS found patient tachypneic and tachycardic and suctioning was done. In the ER chest x-ray did not show anything acute. Suctioning in the ER showed no blood. Due to patient's recent cardiac arrest due to obstructive airway as well as the recurring nature of his airway difficulties (EMS called to the home on multiple occasions, and this representing his 3rd trip back to the hospital), he was not felt to be safe to return home.   Of note, patient's notes for this admission stated that he had trach site hemorrhage from an artery that was injured during his tracheostomy tube downsizing. This is incorrect. As can be seen in Dr. Raye Sorrow operative note and Dr. Ellyn Hack trach change procedure note, he had a sidewall injury to his right anterior jugular VEIN from his original tracheostomy by General Surgery, NOT from his tracheostomy tube change, which was uneventful.  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 -sputum cx positive for abundant staph pansensitive so will dc Vanco in favor of Levaquin and cont for 5 more days -fever better after Motrin   S/p emergency Tracheostomy  -trach removed 5/1 and airway stable immediately post op  Mild acute delirium/anxiety -resolved after defervesced -suspect unfamiliar surroundings and acute infection/fever are causative -pt and dtr request cont prn Ativan after dc and they will dw PCP at FU in 2 weeks ti determine if need to continue more  long term  Hypertension -Continue home meds  Hyperlipidemia -Continue statin  Fracture of rib of right side/Fracture, sternum closed -Continue tramadol  Atrial fibrillation -Noted on EKG on previous admission on 4/12 -He was evaluated by Dr. Melburn Popper and it was decided to forego anticoagulation due to his recent bleeding at the site of the trach-since trach out may be able to reconsider -He was discharged with a 30 day event monitor and to followup with [facility] cardiology -consulted(4/28) and made aware of admit and need to clarify if monitor ready for dc/any FU appts -Echo revealed mild LVH and mild aortic regurgitation - no other significant abnormality  Code Status: Full code Family Communication: With daughter and patient Disposition Plan: SDU-plan dc in am with [facility] follow up  Consultants: ENT Cardiology  Procedures: None  Antibiotics: Zithromax 4/29 >>> 4/30 Vancomycin 4/30 >>> 5/1 Levaquin 5/1 >>>  DVT prophylaxis: SCDs  HPI/Subjective: Patient alert post op and requesting food. Overall outlook greatly improved since trach tube dc'd  Objective: Blood pressure 134/63, pulse 82, temperature 97.2 F (36.2 C), temperature source Axillary, resp. rate 11, height 5\' 7"  (1.702 m), weight 85 kg (187 lb 6.3 oz), SpO2 98.00%.  Intake/Output Summary (Last 24 hours) at 06/19/12 1043 Last data filed at 06/19/12 0955  Gross per 24 hour  Intake   1420 ml  Output    501 ml  Net    919 ml   Exam: General: Awake alert oriented x3 - No acute respiratory distress Lungs: Coarse yet CTA posteriorly, No acute respiratory distress at present / trach has been dc'd Cardiovascular: Regular rate and rhythm without murmur gallop or rub normal S1  and S2 Abdomen: Nontender, nondistended, soft, bowel sounds positive, no rebound, no ascites, no appreciable mass Extremities: No significant cyanosis, clubbing, or edema bilateral lower extremities  Data Reviewed: Basic Metabolic  Panel:  Recent Labs Lab 06/13/12 2326 06/14/12 0525 06/18/12 0741 06/19/12 0435  NA 141 140 134* 138  K 3.5 4.1 3.8 3.8  CL 101 103 96 98  CO2 29 28 33* 34*  GLUCOSE 162* 188* 150* 98  BUN 14 14 17 11   CREATININE 0.78 0.66 0.74 0.67  CALCIUM 9.7 9.6 9.4 9.1   CBC:  Recent Labs Lab 06/13/12 2326 06/14/12 0525 06/17/12 0520 06/18/12 0741 06/19/12 0435  WBC 10.0 8.8 12.1* 12.9* 12.3*  NEUTROABS 7.4  --   --   --   --   HGB 9.7* 9.3* 9.8* 9.2* 9.0*  HCT 28.1* 26.5* 28.4* 27.0* 27.0*  MCV 89.5 89.2 89.6 90.0 90.3  PLT 217 202 186 172 196    Recent Results (from the past 240 hour(s))  MRSA PCR SCREENING     Status: Abnormal   Collection Time    06/14/12  2:30 AM      Result Value Range Status   MRSA by PCR POSITIVE (*) NEGATIVE Final   Comment:            The GeneXpert MRSA Assay (FDA     approved for NASAL specimens     only), is one component of a     comprehensive MRSA colonization     surveillance program. It is not     intended to diagnose MRSA     infection nor to guide or     monitor treatment for     MRSA infections.     RESULT CALLED TO, READ BACK BY AND VERIFIED WITH:     OFORI,E RN [phone] AT [phone] SKEEN,P  CULTURE, RESPIRATORY (NON-EXPECTORATED)     Status: None   Collection Time    06/16/12  3:28 PM      Result Value Range Status   Specimen Description TRACHEAL ASPIRATE   Final   Special Requests NONE   Final   Gram Stain     Final   Value: FEW WBC PRESENT, PREDOMINANTLY PMN     NO SQUAMOUS EPITHELIAL CELLS SEEN     FEW GRAM POSITIVE COCCI     IN PAIRS   Culture     Final   Value: ABUNDANT STAPHYLOCOCCUS AUREUS     Note: RIFAMPIN AND GENTAMICIN SHOULD NOT BE USED AS SINGLE DRUGS FOR TREATMENT OF STAPH INFECTIONS.   Report Status 06/19/2012 FINAL   Final   Organism ID, Bacteria STAPHYLOCOCCUS AUREUS   Final  CULTURE, BLOOD (ROUTINE X 2)     Status: None   Collection Time    06/16/12  9:01 PM      Result Value Range Status   Specimen  Description BLOOD LEFT ARM   Final   Special Requests BOTTLES DRAWN AEROBIC ONLY 5CC   Final   Culture  Setup Time 06/17/2012 02:40   Final   Culture     Final   Value:        BLOOD CULTURE RECEIVED NO GROWTH TO DATE CULTURE WILL BE HELD FOR 5 DAYS BEFORE ISSUING A FINAL NEGATIVE REPORT   Report Status PENDING   Incomplete  CULTURE, BLOOD (ROUTINE X 2)     Status: None   Collection Time    06/16/12  9:01 PM      Result Value Range Status  Specimen Description BLOOD RIGHT ARM   Final   Special Requests BOTTLES DRAWN AEROBIC ONLY 5CC   Final   Culture  Setup Time 06/17/2012 02:40   Final   Culture     Final   Value:        BLOOD CULTURE RECEIVED NO GROWTH TO DATE CULTURE WILL BE HELD FOR 5 DAYS BEFORE ISSUING A FINAL NEGATIVE REPORT   Report Status PENDING   Incomplete  URINE CULTURE     Status: None   Collection Time    06/17/12 10:08 AM      Result Value Range Status   Specimen Description URINE, RANDOM   Final   Special Requests Normal   Final   Culture  Setup Time 06/17/2012 11:48   Final   Colony Count NO GROWTH   Final   Culture NO GROWTH   Final   Report Status 06/18/2012 FINAL   Final    Studies:  Recent x-ray studies have been reviewed in detail by the Attending Physician  Scheduled Meds:  Scheduled Meds: . albuterol  2.5 mg Nebulization QID  . amLODipine  10 mg Oral Daily  . atorvastatin  40 mg Oral q morning - 10a  . Chlorhexidine Gluconate Cloth  6 each Topical Q0600  . dexamethasone  10 mg Intravenous Once  . fluticasone  1 spray Each Nare Daily  . folic acid  1 mg Oral Daily  . ipratropium  2 spray Nasal QID  . levofloxacin  750 mg Oral Daily  . lidocaine  20 mL Mouth/Throat Once  . metoprolol tartrate  25 mg Oral BID  . mupirocin ointment   Nasal BID  . sodium chloride  3 mL Intravenous Q12H  . thiamine  100 mg Oral Daily   Continuous Infusions: . sodium chloride Stopped (06/16/12 1000)    Time spent on care of this patient: 25  minutes   [facility]AN[facility]  [facility] Office  [phone] Pager - Text Page per Loretha Stapler as per below:  On-Call/Text Page:      [email or web address]      password [email or web address]  If 7PM-7AM, please contact night-coverage [email or web address] Password [facility] 06/19/2012, 10:43 AM   LOS: 6 days   I have examined the patient, reviewed the chart and modified the above note which I agree with.   Kaitland Lewellyn,MD [phone] 06/19/2012, 4:14 PM

## 2012-06-19 NOTE — Progress Notes (Signed)
PT Cancellation Note  Patient Details Name: Keith [name] MRN: [id number] DOB: 07/01/1940   Cancelled Treatment:    Reason Eval/Treat Not Completed: Patient at procedure or test/unavailable.  Pt in procedure this AM and currently refusing PT due to fatigue.  Will attempt to see tomorrow.   [name] 06/19/2012, 2:03 PM Jake Shark, PT DPT [phone]

## 2012-06-19 NOTE — Op Note (Addendum)
DATE OF OPERATION: 06/19/2012 Surgeon: [name] Procedure Performed: direct laryngoscopy with telescope [id number] [phone] diagnostic rigid bronchoscopy  PREOPERATIVE DIAGNOSIS: history of angioedema with laryngeal edema requiring emergency tracheotomy POSTOPERATIVE DIAGNOSIS: history of angioedema with laryngeal edema requiring emergency tracheotomy  SURGEON: [name] ANESTHESIA: General endotracheal via tracheotomy.  ESTIMATED BLOOD LOSS: minimal DRAINS: none SPECIMENS: old tracheostomy tube removed INDICATIONS: The patient is a 72yo AAM with a history of angioedema with laryngeal edema requiring emergency tracheotomy. His laryngeal edema is improved and he is brought to teh OR for laryngoscopy, bronchoscopy, and possible decannulation. DESCRIPTION OF OPERATION: The patient was brought to the operating room and was placed in the supine position and was placed under general endotracheal anesthesia by anesthesiology.   The teeth were protected with the tooth guard. Direct laryngoscopy was performed using the Dedo laryngoscope. This showed a grade II view and the true vocal folds could not be seen so I switched to the anterior commissure scope. The patient was suspended with the suspension setup. A grade I view was seen with a normal but omega-shaped epiglottis and no false or true vocal fold edema. No glottic or subglottic masses or stenosis were seen. The tongue base and posterior pharynx were normal. The true vocal fold and subglottic mucosa were normal with a widely patent glottic and subglottic and supraglottic airway.  I then removed the 4 cuffed trach and rigid bronchoscopy was performed using the 4mm 0 degree endoscope. The trachea and subglottis appeared normal and widely patent and the trachea and mainstem bronchi and carina were all normal and widely patent. There were some mild/moderate physiologic secretions in the mainstem bronchi but no obstructions or masses.  As the glottic and  subglottic/tracheal airway were all patent, the decision was made to decannulate the patient. He was awakened and once he was spontaneously breathing the trach was removed and the trach site was dressed with a large Xeroform guaze, 4x4 guaze, and tape. He was breathing comfortably with good tidal volumes and oxygen saturations.  The patient was turned back to anesthesia and awakened from anesthesia without difficulty. The patient tolerated the procedure well with no immediate complications and was taken to the postoperative recovery area in good condition.   Dr. [name] was present and performed the entire procedure. 06/19/2012  8:50 AM [name]  Plan: If patient tolerates decannulation he can be monitored in the stepdown for 24-48 hours then discharged if stable. Can have trach occlusive dressing changes per routine until trach site heals and follow up as needed.

## 2012-06-19 NOTE — H&P (Signed)
06/19/2012  Keith [name]  PREOPERATIVE HISTORY AND PHYSICAL  CHIEF COMPLAINT: laryngeal swelling, history of emergency tracheotomy  HISTORY: This is a 72 year old who presented with acute obstructive angioedema requiring emergency tracheotomy by trauma surgery He has had intermittent bleeding from the trach site and difficulty breathing with the trach finger occluded.  He now presents for direct laryngoscopy and possible trach site revision/upsizing vs. decannulation.  Dr. Emeline Darling, [place] Riley has discussed the risks (bleeding, airway obstruction, etc.), benefits, and alternatives of this procedure. The patient understands the risks and would like to proceed with the procedure. The chances of success of the procedure are >25% and the patient understands this. I personally performed an examination of the patient within 24 hours of the procedure.  PAST MEDICAL HISTORY: Past Medical History  Diagnosis Date  . HTN (hypertension)   . Hypercholesterolemia     PAST SURGICAL HISTORY: Past Surgical History  Procedure Laterality Date  . Tracheostomy  05/22/12    emergent, angioedema  . Tracheostomy tube placement N/A 05/31/2012    Procedure: TRACHEOSTOMY;  Surgeon: Flo Shanks, MD;  Location: [facility] OR;  Service: ENT;  Laterality: N/A;  . Laryngoscopy N/A 05/31/2012    Procedure: LARYNGOSCOPY;  Surgeon: Flo Shanks, MD;  Location: [facility] OR;  Service: ENT;  Laterality: N/A;    MEDICATIONS: Current facility-administered medications:0.9 %  sodium chloride infusion, , Intravenous, Continuous, Eduard Clos, MD, 1 mL at 06/15/12 1146;  acetaminophen (TYLENOL) suppository 650 mg, 650 mg, Rectal, Q6H PRN, Eduard Clos, MD;  acetaminophen (TYLENOL) tablet 650 mg, 650 mg, Oral, Q6H PRN, Eduard Clos, MD, 650 mg at 06/17/12 1219 albuterol (PROVENTIL) (5 MG/ML) 0.5% nebulizer solution 2.5 mg, 2.5 mg, Nebulization, Q2H PRN, Eduard Clos, MD, 2.5 mg at 06/14/12 0721;  albuterol (PROVENTIL) (5  MG/ML) 0.5% nebulizer solution 2.5 mg, 2.5 mg, Nebulization, QID, Lonia Blood, MD, 2.5 mg at 06/18/12 2009;  amLODipine (NORVASC) tablet 10 mg, 10 mg, Oral, Daily, Eduard Clos, MD, 10 mg at 06/18/12 1059 atorvastatin (LIPITOR) tablet 40 mg, 40 mg, Oral, q morning - 10a, Eduard Clos, MD, 40 mg at 06/18/12 1059;  Chlorhexidine Gluconate Cloth 2 % PADS 6 each, 6 each, Topical, Q0600, Calvert Cantor, MD, 6 each at 06/18/12 0336;  clindamycin (CLEOCIN) IVPB 600 mg, 600 mg, Intravenous, Once, Keith Beam, MD;  dexamethasone (DECADRON) injection 10 mg, 10 mg, Intravenous, Once, Keith Beam, MD fluticasone (FLONASE) 50 MCG/ACT nasal spray 1 spray, 1 spray, Each Nare, Daily, Eduard Clos, MD, 1 spray at 06/18/12 1100;  folic acid (FOLVITE) tablet 1 mg, 1 mg, Oral, Daily, Eduard Clos, MD, 1 mg at 06/18/12 1059;  ibuprofen (ADVIL,MOTRIN) tablet 400 mg, 400 mg, Oral, Q6H PRN, Russella Dar, NP, 400 mg at 06/17/12 1719 ipratropium (ATROVENT) 0.06 % nasal spray 2 spray, 2 spray, Nasal, QID, Eduard Clos, MD, 2 spray at 06/18/12 2131;  lidocaine (XYLOCAINE) 2 % viscous mouth solution 20 mL, 20 mL, Mouth/Throat, Once, Flo Shanks, MD;  LORazepam (ATIVAN) injection 0.5-1 mg, 0.5-1 mg, Intravenous, Q6H PRN, Calvert Cantor, MD, 1 mg at 06/16/12 0322;  menthol-cetylpyridinium (CEPACOL) lozenge 3 mg, 1 lozenge, Oral, PRN, Calvert Cantor, MD metoprolol tartrate (LOPRESSOR) tablet 25 mg, 25 mg, Oral, BID, Eduard Clos, MD, 25 mg at 06/18/12 2131;  mupirocin ointment (BACTROBAN) 2 %, , Nasal, BID, Calvert Cantor, MD;  ondansetron (ZOFRAN) injection 4 mg, 4 mg, Intravenous, Q6H PRN, Eduard Clos, MD;  ondansetron (ZOFRAN) tablet 4 mg, 4 mg, Oral,  Q6H PRN, Eduard Clos, MD oxyCODONE-acetaminophen (PERCOCET/ROXICET) 5-325 MG per tablet 1 tablet, 1 tablet, Oral, Q4H PRN, Roma Kayser Schorr, NP, 1 tablet at 06/18/12 2353;  sodium chloride 0.9 % injection 3 mL, 3 mL,  Intravenous, Q12H, Eduard Clos, MD, 3 mL at 06/18/12 2133;  thiamine (VITAMIN B-1) tablet 100 mg, 100 mg, Oral, Daily, Eduard Clos, MD, 100 mg at 06/18/12 1059 traMADol (ULTRAM) tablet 100 mg, 100 mg, Oral, Q6H PRN, Calvert Cantor, MD, 100 mg at 06/18/12 2143;  vancomycin (VANCOCIN) 1,250 mg in sodium chloride 0.9 % 250 mL IVPB, 1,250 mg, Intravenous, Q12H, Lonia Blood, MD, 1,250 mg at 06/19/12 0000;  zolpidem (AMBIEN) tablet 5 mg, 5 mg, Oral, QHS PRN, Calvert Cantor, MD, 5 mg at 06/15/12 2132  ALLERGIES: Allergies  Allergen Reactions  . Lisinopril Other (See Comments)    Presented with laryngeal edema - likely caused is lisinopril      SOCIAL HISTORY: History   Social History  . Marital Status: Single    Spouse Name: N/A    Number of Children: N/A  . Years of Education: N/A   Occupational History  . Not on file.   Social History Main Topics  . Smoking status: Never Smoker   . Smokeless tobacco: Not on file  . Alcohol Use: 7.0 oz/week    14 drink(s) per week     Comment: drinks daily  . Drug Use: No  . Sexually Active: Not on file   Other Topics Concern  . Not on file   Social History Narrative  . No narrative on file    FAMILY HISTORY:History reviewed. No pertinent family history.  REVIEW OF SYSTEMS:  HEENT: dyspnea, otherwise negative x 10 systems except per HPI   PHYSICAL EXAM:  GENERAL:  NAD VITAL SIGNS:   Filed Vitals:   06/19/12 0500  BP: 101/56  Pulse: 70  Temp:   Resp: 12   SKIN:  Warm,dry HEENT:  Oral cavity clear, tongue mobile NECK:  4 cuffed trach in place, well-healed tracheostomy site LYMPH:  no LAD LUNGS:  Grossly clear CARDIOVASCULAR:  RRR ABDOMEN:  soft MUSCULOSKELETAL: normal strength PSYCH:  appropriate NEUROLOGIC:  CN 2-12 intact and symmetric  DIAGNOSTIC STUDIES: trach sputum culture pending  ASSESSMENT AND PLAN: Plan to proceed with direct laryngoscopy and possible trach upsize vs. decannulation. Patient  understands the risks, benefits, and alternatives.  06/19/2012  6:36 AM Keith [name]

## 2012-06-19 NOTE — Anesthesia Preprocedure Evaluation (Addendum)
Anesthesia Evaluation  Patient identified by MRN, date of birth, ID band Patient awake    Reviewed: Allergy & Precautions, H&P , NPO status , Patient's Chart, lab work & pertinent test results  Airway       Dental   Pulmonary          Cardiovascular hypertension, + dysrhythmias     Neuro/Psych    GI/Hepatic   Endo/Other    Renal/GU      Musculoskeletal   Abdominal   Peds  Hematology   Anesthesia Other Findings Trach 2nd to Hx of angioedema  Reproductive/Obstetrics                           Anesthesia Physical Anesthesia Plan  ASA: II  Anesthesia Plan: General   Post-op Pain Management:    Induction: Intravenous and Inhalational  Airway Management Planned:   Additional Equipment:   Intra-op Plan:   Post-operative Plan:   Informed Consent: I have reviewed the patients History and Physical, chart, labs and discussed the procedure including the risks, benefits and alternatives for the proposed anesthesia with the patient or authorized representative who has indicated his/her understanding and acceptance.     Plan Discussed with: CRNA, Anesthesiologist and Surgeon  Anesthesia Plan Comments:         Anesthesia Quick Evaluation

## 2012-06-20 MED ORDER — ALBUTEROL SULFATE HFA 108 (90 BASE) MCG/ACT IN AERS: 2.0000 | INHALATION_SPRAY | Freq: Four times a day (QID) | RESPIRATORY_TRACT | Status: AC | PRN

## 2012-06-20 MED ORDER — LEVOFLOXACIN 750 MG PO TABS: 750.0000 mg | ORAL_TABLET | Freq: Every day | ORAL | Status: AC

## 2012-06-20 MED ORDER — LORAZEPAM 0.5 MG PO TABS: 0.5000 mg | ORAL_TABLET | Freq: Three times a day (TID) | ORAL | Status: DC

## 2012-06-20 MED ORDER — OXYCODONE-ACETAMINOPHEN 5-325 MG PO TABS: 1.0000 | ORAL_TABLET | ORAL | Status: DC | PRN

## 2012-06-20 NOTE — Progress Notes (Signed)
Per [facility] liaison, home health RN will make home visit tomorrow if pt is discharged today.  They will also provide PT, OT, Aide, and SW.

## 2012-06-20 NOTE — Progress Notes (Signed)
Subjective: POD#1 from direct laryngoscopy, bronchoscopy, and decannulation. Doing well, sat's >95% on room air, breathing comfortably and eating, drinking well  Objective: Vital signs in last 24 hours: Temp:  [97.2 F (36.2 C)-98.9 F (37.2 C)] 98.5 F (36.9 C) (05/02 0803) Pulse Rate:  [66-101] 82 (05/02 0803) Resp:  [9-17] 17 (05/02 0803) BP: (118-143)/(59-70) 118/63 mmHg (05/02 0803) SpO2:  [95 %-100 %] 97 % (05/02 0803) Weight:  [85 kg (187 lb 6.3 oz)] 85 kg (187 lb 6.3 oz) (05/02 0400)  CN 2-12 intact and symmetric BL, no stridor or stertor, normal voice with no hoarseness, neck flat and supple with occlusive dressing in place and clean, dry, and intact.  @LABLAST2 (wbc:2,hgb:2,hct:2,plt:2)  Recent Labs  06/18/12 0741 06/19/12 0435  NA 134* 138  K 3.8 3.8  CL 96 98  CO2 33* 34*  GLUCOSE 150* 98  BUN 17 11  CREATININE 0.74 0.67  CALCIUM 9.4 9.1    Medications:  Scheduled Meds: . albuterol  2.5 mg Nebulization QID  . amLODipine  10 mg Oral Daily  . atorvastatin  40 mg Oral q morning - 10a  . Chlorhexidine Gluconate Cloth  6 each Topical Q0600  . dexamethasone  10 mg Intravenous Once  . fluticasone  1 spray Each Nare Daily  . folic acid  1 mg Oral Daily  . ipratropium  2 spray Nasal QID  . levofloxacin  750 mg Oral Daily  . lidocaine  20 mL Mouth/Throat Once  . metoprolol tartrate  25 mg Oral BID  . mupirocin ointment   Nasal BID  . sodium chloride  3 mL Intravenous Q12H  . thiamine  100 mg Oral Daily   Continuous Infusions: . sodium chloride Stopped (06/16/12 1000)   PRN Meds:.acetaminophen, acetaminophen, albuterol, ibuprofen, LORazepam, menthol-cetylpyridinium, ondansetron (ZOFRAN) IV, ondansetron, oxyCODONE-acetaminophen, traMADol, zolpidem  Assessment/Plan: Doing well POD#1 from DL/bronch, decannulation. Ok to discharge home from ENT perspective whenever primary team feels he is ready for discharge. Can teach patient/family occlusive dressing changes  until the trach site closes and then can stop occlusive dressing changes. Can follow up with ENT as needed.   LOS: 7 days   [name] 06/20/2012, 8:58 AM

## 2012-06-20 NOTE — Discharge Summary (Signed)
Physician Discharge Summary  Keith [name]:[id number] DOB: May 23, 1940 DOA: 06/13/2012  PCP: No primary provider on file.  Admit date: 06/13/2012 Discharge date: 06/20/2012  Time spent: >30 minutes  Recommendations for Outpatient Follow-up:  1. Followup with ear nose and throat physician as needed 2. Followup with cardiologist as previously planned regarding the Holter monitor and to determine if has had recurrent atrial fibrillation and/or needs anticoagulation in the future. 3. Home health RN, PT, social worker, OT and aide has been established prior to discharge   Discharge Diagnoses:    History of Laryngeal edema due to ACE inhibitor mediated angioedema   Airway obstruction/recurrent secondary to dried blood and acute tracheobronchitis   Hypertension   Hyperlipidemia   Tracheostomy - discontinued this admission   Fracture of rib of right side/Fracture, sternum closed   Fever secondary to acute methicillin sensitive Staphylococcus aureus tracheobronchitis   Recent paroxysmal atrial fibrillation   Discharge Condition: stable  Diet recommendation: Regular  Filed Weights   06/16/12 0500 06/19/12 0407 06/20/12 0400  Weight: 84.6 kg (186 lb 8.2 oz) 85 kg (187 lb 6.3 oz) 85 kg (187 lb 6.3 oz)    History of present illness:  72 y.o. male on trach collar because of recent laryngeal edema requiring emergency tracheostomy secondary to angioedema from ACE inhibitors who had subsequent cardiac arrest due to airway obstruction who re-presented to the ER because of sudden onset of difficulty breathing. EMS was called. EMS found patient tachypneic and tachycardic and suctioning was done. In the ER chest x-ray did not show anything acute. Suctioning in the ER showed no blood. Due to patient's recent cardiac arrest due to obstructive airway as well as the recurring nature of his airway difficulties (EMS called to the home on multiple occasions, and this representing his 3rd trip back to the  hospital), he was not felt to be safe to return home.  Of note, patient's notes for this admission stated that he had trach site hemorrhage from an artery that was injured during his tracheostomy tube downsizing. This is incorrect. As can be seen in Dr. Raye Sorrow operative note and Dr. Ellyn Hack trach change procedure note, he had a sidewall injury to his right anterior jugular VEIN from his original tracheostomy by General Surgery, NOT from his tracheostomy tube change, which was uneventful.   Hospital Course:   Recurrent Airway obstruction w/ episodic hypoxia due to MSSA tracheobronchitis Initially presented with symptoms more consistent with transient obstructive symptoms from recurrent intratracheal bleeding. ENT was consulted. Symptoms improved after initiation of saline flushes directly to trach. Subsequently patient developed fevers and increasing hypoxemia with confusion and thick sputum. He also developed mild leukocytosis. Tracheal aspirate was positive for methicillin sensitive staph aureus. He had been empirically treated with vancomycin but this was switched to Levaquin which he will continue for a total of seven-day course to be completed after discharge.  History of emergency Tracheostomy  Evaluated by ENT this admission. Taken to the operating suite on 06/19/2012 for direct visualization of his airway. Trachea laryngeal exam was normal. The tracheostomy tube was discontinued. ENT recommends daily occlusive trach dressings utilizing large Vaseline and/or Xeroform gauze covered by 4 x 4 dry gauze and taped into place. Anticipation the trach site will heal by secondary intention and patient to followup with the ENT physician as needed.  Once the tracheostomy site has granulated in or closed over he can stop the occlusive dressings.  Paroxysmal Atrial fibrillation Seen on EKG during previous admission 05/31/2012. At that  time he was evaluated by Dr. Elease Hashimoto and anticoagulation was not initiated  due to recent bleeding at the tracheostomy site. They did document that when the tracheostomy tube was discontinued that they may reconsider anticoagulation. In the meantime and prior to this admission patient had a 30 day event monitor placed by [facility] cardiology. This event monitor has remained in place during the hospitalization. The patient will need to followup with cardiology after discharge so he can turn in the event monitor to determine if anticoagulation is indicated. Of note no obvious paroxysmal atrial fibrillation has been documented this hospitalization. He also underwent an echocardiogram recently that revealed mild LVH and mild aortic regurgitation  Mild acute delirium/anxiety This was transient in nature regarding the acute delirium and resolved after patient defervesced and had adequate treatment of upper respiratory infection. Patient is having difficulty with insomnia and we will continue low-dose when necessary Ativan after discharge noting that patient will follow with primary care physician early next week to determine if this medication is to continue more long-term  Fractures a right-sided ribs/nondisplaced Will refill prescription for narcotic pain medication. He will also continue preadmission tramadol for pain that is milder in nature.  Hypertension No changes have been made to the patient's preadmission medications.  Procedures:  Direct laryngoscopy with telescoping diagnostic rigid bronchoscopy with subsequent removal of #4 tracheostomy tube  (5/1) by Dr. [name]  Consultations:  ENT  Discharge Exam: Filed Vitals:   06/20/12 0756 06/20/12 0803 06/20/12 0900 06/20/12 1000  BP:  118/63  124/56  Pulse:  82 93 89  Temp:  98.5 F (36.9 C)    TempSrc:  Oral    Resp:  17 22 18   Height:      Weight:      SpO2: 96% 97% 100% 100%   General: Awake alert oriented x3 - No acute respiratory distress  Lungs: Coarse yet CTA posteriorly, No acute respiratory  distress at present / trach has been dc'd  Cardiovascular: Regular rate and rhythm without murmur gallop or rub normal S1 and S2  Abdomen: Nontender, nondistended, soft, bowel sounds positive, no rebound, no ascites, no appreciable mass  Extremities: No significant cyanosis, clubbing, or edema bilateral lower extremities   Discharge Instructions      Discharge Orders   Future Appointments Provider Department Dept Phone   07/15/2012 9:45 AM Vesta Mixer, MD [facility] Main [facility] [place]) [phone]   Future Orders Complete By Expires     Call MD for:  extreme fatigue  As directed     Call MD for:  persistant dizziness or light-headedness  As directed     Call MD for:  redness, tenderness, or signs of infection (pain, swelling, redness, odor or green/yellow discharge around incision site)  As directed     Call MD for:  severe uncontrolled pain  As directed     Call MD for:  temperature >100.4  As directed     Diet - low sodium heart healthy  As directed     Discharge wound care:  As directed     Comments:      daily occlusive trach dressings utilizing large Vaseline and/or Xeroform calls covered by 4 x 4 dry gauze and taped into place.    Increase activity slowly  As directed         Medication List    TAKE these medications       albuterol 108 (90 BASE) MCG/ACT inhaler  Commonly known as:  PROVENTIL  HFA;VENTOLIN HFA  Inhale 2 puffs into the lungs every 6 (six) hours as needed for wheezing.     amLODipine 10 MG tablet  Commonly known as:  NORVASC  Take 1 tablet (10 mg total) by mouth daily.     atorvastatin 40 MG tablet  Commonly known as:  LIPITOR  Take 40 mg by mouth every morning.     EPINEPHrine 0.3 mg/0.3 mL Devi  Commonly known as:  EPIPEN  Inject 0.3 mLs (0.3 mg total) into the muscle once.     folic acid 1 MG tablet  Commonly known as:  FOLVITE  Take 1 tablet (1 mg total) by mouth daily.     ipratropium 0.06 % nasal spray  Commonly known as:   ATROVENT  Place 2 sprays into the nose 4 (four) times daily.     levofloxacin 750 MG tablet  Commonly known as:  LEVAQUIN  Take 1 tablet (750 mg total) by mouth daily.     LORazepam 0.5 MG tablet  Commonly known as:  ATIVAN  Take 1 tablet (0.5 mg total) by mouth every 8 (eight) hours.     metoprolol tartrate 25 MG tablet  Commonly known as:  LOPRESSOR  Take 1 tablet (25 mg total) by mouth 2 (two) times daily.     mometasone 50 MCG/ACT nasal spray  Commonly known as:  NASONEX  Place 2 sprays into the nose daily.     oxyCODONE-acetaminophen 5-325 MG per tablet  Commonly known as:  PERCOCET/ROXICET  Take 1 tablet by mouth every 4 (four) hours as needed for pain.     thiamine 100 MG tablet  Take 1 tablet (100 mg total) by mouth daily.     traMADol 50 MG tablet  Commonly known as:  ULTRAM  Take 1 tablet (50 mg total) by mouth every 6 (six) hours as needed for pain.       Allergies  Allergen Reactions  . Lisinopril Other (See Comments)    Presented with laryngeal edema - likely caused is lisinopril    Follow-up Information   Call Elyn Aquas., MD. (tO FIND OUT WHEN YOU NEED TO RETURN REGARDING THE HOLTER MONITOR)    Contact information:   [address] [place] [address] [phone]       Follow up with [name], MD. Schedule an appointment as soon as possible for a visit in 2 weeks. (ROUTINE POST OP FOLLOW UP)    Contact information:   [address] [place] [address] [phone]        Microbiology: Recent Results (from the past 240 hour(s))  MRSA PCR SCREENING     Status: Abnormal   Collection Time    06/14/12  2:30 AM      Result Value Range Status   MRSA by PCR POSITIVE (*) NEGATIVE Final   Comment:            The GeneXpert MRSA Assay (FDA     approved for NASAL specimens     only), is one component of a     comprehensive MRSA colonization     surveillance program. It is not     intended to diagnose  MRSA     infection nor to guide or     monitor treatment for     MRSA infections.     RESULT CALLED TO, READ BACK BY AND VERIFIED WITH:     OFORI,E RN [phone] AT [phone] SKEEN,P  CULTURE, RESPIRATORY (  NON-EXPECTORATED)     Status: None   Collection Time    06/16/12  3:28 PM      Result Value Range Status   Specimen Description TRACHEAL ASPIRATE   Final   Special Requests NONE   Final   Gram Stain     Final   Value: FEW WBC PRESENT, PREDOMINANTLY PMN     NO SQUAMOUS EPITHELIAL CELLS SEEN     FEW GRAM POSITIVE COCCI     IN PAIRS   Culture     Final   Value: ABUNDANT STAPHYLOCOCCUS AUREUS     Note: RIFAMPIN AND GENTAMICIN SHOULD NOT BE USED AS SINGLE DRUGS FOR TREATMENT OF STAPH INFECTIONS.   Report Status 06/19/2012 FINAL   Final   Organism ID, Bacteria STAPHYLOCOCCUS AUREUS   Final  CULTURE, BLOOD (ROUTINE X 2)     Status: None   Collection Time    06/16/12  9:01 PM      Result Value Range Status   Specimen Description BLOOD LEFT ARM   Final   Special Requests BOTTLES DRAWN AEROBIC ONLY 5CC   Final   Culture  Setup Time 06/17/2012 02:40   Final   Culture     Final   Value:        BLOOD CULTURE RECEIVED NO GROWTH TO DATE CULTURE WILL BE HELD FOR 5 DAYS BEFORE ISSUING A FINAL NEGATIVE REPORT   Report Status PENDING   Incomplete  CULTURE, BLOOD (ROUTINE X 2)     Status: None   Collection Time    06/16/12  9:01 PM      Result Value Range Status   Specimen Description BLOOD RIGHT ARM   Final   Special Requests BOTTLES DRAWN AEROBIC ONLY 5CC   Final   Culture  Setup Time 06/17/2012 02:40   Final   Culture     Final   Value:        BLOOD CULTURE RECEIVED NO GROWTH TO DATE CULTURE WILL BE HELD FOR 5 DAYS BEFORE ISSUING A FINAL NEGATIVE REPORT   Report Status PENDING   Incomplete  URINE CULTURE     Status: None   Collection Time    06/17/12 10:08 AM      Result Value Range Status   Specimen Description URINE, RANDOM   Final   Special Requests Normal   Final   Culture  Setup Time  06/17/2012 11:48   Final   Colony Count NO GROWTH   Final   Culture NO GROWTH   Final   Report Status 06/18/2012 FINAL   Final     Labs: Basic Metabolic Panel:  Recent Labs Lab 06/13/12 2326 06/14/12 0525 06/18/12 0741 06/19/12 0435  NA 141 140 134* 138  K 3.5 4.1 3.8 3.8  CL 101 103 96 98  CO2 29 28 33* 34*  GLUCOSE 162* 188* 150* 98  BUN 14 14 17 11   CREATININE 0.78 0.66 0.74 0.67  CALCIUM 9.7 9.6 9.4 9.1   CBC:  Recent Labs Lab 06/13/12 2326 06/14/12 0525 06/17/12 0520 06/18/12 0741 06/19/12 0435  WBC 10.0 8.8 12.1* 12.9* 12.3*  NEUTROABS 7.4  --   --   --   --   HGB 9.7* 9.3* 9.8* 9.2* 9.0*  HCT 28.1* 26.5* 28.4* 27.0* 27.0*  MCV 89.5 89.2 89.6 90.0 90.3  PLT 217 202 186 172 196   Signed:  ELLIS,ALLISON L.ANP  [facility] 06/20/2012, 11:52 AM  I have personally examined this patient and reviewed the entire  database. I have reviewed the above note, made any necessary editorial changes, and agree with its content.  Cherene Altes, MD [facility]

## 2012-06-23 ENCOUNTER — Encounter (HOSPITAL_COMMUNITY): Payer: Self-pay | Admitting: Otolaryngology

## 2012-06-23 LAB — CULTURE, BLOOD (ROUTINE X 2)
Culture: NO GROWTH
Culture: NO GROWTH

## 2012-07-15 ENCOUNTER — Encounter: Payer: Self-pay | Admitting: Cardiovascular Disease

## 2012-07-15 ENCOUNTER — Ambulatory Visit (INDEPENDENT_AMBULATORY_CARE_PROVIDER_SITE_OTHER): Payer: Medicare Other | Admitting: Cardiovascular Disease

## 2012-07-15 VITALS — BP 142/66 | HR 83 | Ht 68.0 in | Wt 188.0 lb

## 2012-07-15 DIAGNOSIS — I4891 Unspecified atrial fibrillation: Secondary | ICD-10-CM

## 2012-07-15 MED ORDER — AMLODIPINE BESYLATE 5 MG PO TABS: 5.0000 mg | ORAL_TABLET | Freq: Every day | ORAL | Status: DC

## 2012-07-15 MED ORDER — CARVEDILOL 25 MG PO TABS: 25.0000 mg | ORAL_TABLET | Freq: Two times a day (BID) | ORAL | Status: DC

## 2012-07-15 NOTE — Assessment & Plan Note (Addendum)
At this point is pretty well since been doing fairly well. He's not had any episodes of recurrent atrial fibrillation. The episode that was at  the hospital was associated with a respiratory arrest due to  complications with tracheostomy site.  We've placed a 30 day monitor on him and it did not show any episodes of atrial fibrillation. Has a history of hypertension and is age 72 he has some increased risk for thromboembolic at this point I think that the risk of anticoagulation probably exceeds any benefit. I think that the likelihood of him having recurrent atrial fibrillation is fairly low.  Long discussion regarding his alcohol intake. He is done much better without drinking alcohol I think that obtaining from alcohol would greatly diminished his chances of having recurrent atrial fibrillation.  He is having some leg edema. We'll decrease his amlodipine to 5 mg a day. We'll discontinue his metoprolol and start him on carvedilol 25 mg twice a day to help with his blood pressure control since we are decreasing his amlodipine. We certainly need to avoid ACE inhibitors and ARB's because of his anaphylaxis.

## 2012-07-15 NOTE — Progress Notes (Signed)
Keith [name] Date of Birth  1941-01-14       [facility] Office [address]  [address] [place], [place]  [address]   [place], [place]  [address] [phone]     [phone]   Fax  [phone]    Fax [phone]  Problem List: 1. 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 Hypertension 4. Hyperlipidemia  History of Present Illness:  Keith [name] is a 72 yo with hx of respiratory arrest from a tracheostomy complication.  He had a 30 day monitor and did not have any afib on the monitor.  He has been on norvasc 10 mg a day and has leg edema.  Has not had any recurrent arrhythmias. Used to drink about one time of liquor every day. He's not had anything to drink in the past month.   Current Outpatient Prescriptions on File Prior to Visit  Medication Sig Dispense Refill  . folic acid (FOLVITE) 1 MG tablet Take 1 tablet (1 mg total) by mouth daily.      Marland Kitchen ipratropium (ATROVENT) 0.06 % nasal spray Place 2 sprays into the nose 4 (four) times daily.      . metoprolol tartrate (LOPRESSOR) 25 MG tablet Take 1 tablet (25 mg total) by mouth 2 (two) times daily.      . mometasone (NASONEX) 50 MCG/ACT nasal spray Place 2 sprays into the nose as needed.       Marland Kitchen oxyCODONE-acetaminophen (PERCOCET/ROXICET) 5-325 MG per tablet Take 1 tablet by mouth every 4 (four) hours as needed for pain.  15 tablet  0  . thiamine 100 MG tablet Take 1 tablet (100 mg total) by mouth daily.      Marland Kitchen albuterol (PROVENTIL HFA;VENTOLIN HFA) 108 (90 BASE) MCG/ACT inhaler Inhale 2 puffs into the lungs every 6 (six) hours as needed for wheezing.  1 Inhaler  2  . amLODipine (NORVASC) 10 MG tablet Take 1 tablet (10 mg total) by mouth daily.  30 tablet  0  . atorvastatin (LIPITOR) 40 MG tablet Take 40 mg by mouth every morning.       No current  facility-administered medications on file prior to visit.    Allergies  Allergen Reactions  . Lisinopril Other (See Comments)    Presented with laryngeal edema - likely caused is lisinopril     Past Medical History  Diagnosis Date  . HTN (hypertension)   . Hypercholesterolemia     Past Surgical History  Procedure Laterality Date  . Tracheostomy  05/22/12    emergent, angioedema  . Tracheostomy tube placement N/A 05/31/2012    Procedure: TRACHEOSTOMY;  Surgeon: Flo Shanks, MD;  Location: [facility] OR;  Service: ENT;  Laterality: N/A;  . Laryngoscopy N/A 05/31/2012    Procedure: LARYNGOSCOPY;  Surgeon: Flo Shanks, MD;  Location: [facility] OR;  Service: ENT;  Laterality: N/A;  . Laryngoscopy N/A 06/19/2012    Procedure: DIRECT LARYNGOSCOPY AND BRONCHOSCOPY;  Surgeon: [name], MD;  Location: [facility] OR;  Service: ENT;  Laterality: N/A;  . Tracheostomy revision N/A 06/19/2012    Procedure: TRACHEOSTOMY DECANNULATION;  Surgeon: [name], MD;  Location: [facility] OR;  Service: ENT;  Laterality: N/A;  MICROSCOPE/TELESCOPE TRACHEOSTOMY REVISION    History  Smoking status  . Never Smoker   Smokeless tobacco  . Not on file    History  Alcohol  Use  . 7.0 oz/week  . 14 drink(s) per week    Comment: drinks daily    No family history on file.  Reviw of Systems:  Reviewed in the HPI.  All other systems are negative.  Physical Exam: Blood pressure 142/66, pulse 83, height 5\' 8"  (1.727 m), weight 188 lb (85.276 kg), SpO2 97.00%. General: Well developed, well nourished, in no acute distress.  Head: Normocephalic, atraumatic, sclera non-icteric, mucus membranes are moist,   Neck: Supple. Carotids are 2 + without bruits. No JVD , he has a tracheostomy site that is healing  Lungs: Clear   Heart: RR, normal S1, S2  Abdomen: Soft, non-tender, non-distended with normal bowel sounds.  Msk:  Strength and tone are normal   Extremities: No clubbing or cyanosis.1-2+  edema.  Distal pedal pulses are  2+ and equal    Neuro: CN II - XII intact.  Alert and oriented X 3.   Psych:  Normal   ECG:   Assessment / Plan:

## 2012-07-15 NOTE — Patient Instructions (Addendum)
Lounge doctor lounge rest  Your physician recommends that you schedule a follow-up appointment in: 3 MONTHS   Your physician has recommended you make the following change in your medication:   DECREASE AMLODIPINE TO 5 MG DAILY STOP METOPROLOL START CARVEDILOL/ COREG 25 MG TWICE A DAY, 12 HOURS APART.

## 2012-07-24 ENCOUNTER — Telehealth: Payer: Self-pay | Admitting: Cardiovascular Disease

## 2012-07-24 MED ORDER — AMLODIPINE BESYLATE 2.5 MG PO TABS: 2.5000 mg | ORAL_TABLET | Freq: Every day | ORAL | Status: DC

## 2012-07-24 NOTE — Telephone Encounter (Signed)
Per daughter, pt was started on the amlodipine by PCPjust prior to hospital admission and the leg swelling started then, Per Dr Elease Hashimoto, stop amlodipine. Family aware.

## 2012-07-24 NOTE — Telephone Encounter (Signed)
Follow up  ° ° ° ° °Pt is returning your call  °

## 2012-07-24 NOTE — Telephone Encounter (Signed)
Pt was on amlodipine  2.5 mg at home prior to hospital admission, in the hospital at [place] pt was put on 10 mg. During office visit pt was decreased to 5mg  due to swelling. Information provided by [facility] nurse and bottles were verified. I will  Have Dr Elease Hashimoto advise what strength he should be on.

## 2012-07-24 NOTE — Telephone Encounter (Signed)
MSG LEFT/ I WILL TRY TO MAKE CONTACT LATER.

## 2012-07-24 NOTE — Telephone Encounter (Signed)
New problem   Keith [name]/Keith [name] need to confirm pt's dosage of Norvasc.the pt is having swelling.

## 2012-07-24 NOTE — Telephone Encounter (Signed)
Called HH nurse, BP was low today 100/60, pt took amlodipine 5 mg, per Dr Elease Hashimoto pt is to decrease to 2.5 mg. I called his daughter and left a msg, [facility] nurse said he could be reached at that number, I gave the information and asked to call back to confirm the dose he is to be taking.

## 2012-08-10 ENCOUNTER — Other Ambulatory Visit: Payer: Self-pay | Admitting: Cardiovascular Disease

## 2012-08-11 ENCOUNTER — Other Ambulatory Visit: Payer: Self-pay | Admitting: Cardiovascular Disease

## 2012-08-11 NOTE — Telephone Encounter (Signed)
Fax Received. Refill Completed. Jasier Calabretta Chowoe (R.M.A)   

## 2012-08-12 NOTE — Telephone Encounter (Signed)
Fax Received. Refill Completed. Everlene Cunning Chowoe (R.M.A)   

## 2012-08-14 ENCOUNTER — Other Ambulatory Visit: Payer: Self-pay | Admitting: Cardiovascular Disease

## 2012-08-14 NOTE — Telephone Encounter (Signed)
Fax Received. Refill Completed. Keith [name] (R.M.A)   

## 2012-08-29 ENCOUNTER — Telehealth: Payer: Self-pay | Admitting: Cardiovascular Disease

## 2012-08-29 NOTE — Telephone Encounter (Signed)
New Problem  Pt daughter states that his feet is still continuing to swell, to where he cannot even put shoes on at times. She wants to know what to do.

## 2012-08-29 NOTE — Telephone Encounter (Signed)
No further recommendations at this time.  Agree with salt restriction , compression hose, leg elevation.

## 2012-08-29 NOTE — Telephone Encounter (Signed)
Follow up  ° ° °Pt daughter is returning your call  °

## 2012-08-29 NOTE — Telephone Encounter (Signed)
I spoke with the pt's daughter and she states the pt has not had any improvement in his lower extremity edema since amlodipine was stopped 07/24/12.  The pt also injured his right knee and is complaining of pain. I did discuss diet and the pt consumes salt.  The pt does eat canned foods and has not been watching his salt intake. I recommended that the pt cut out salt from diet, elevate legs when able and wear compression stockings. The pt's daughter will work on these things and I will send this message to Dr Elease Hashimoto for review and further recommendations.

## 2012-08-29 NOTE — Telephone Encounter (Signed)
Left message on machine for pt to contact the office.   Need to clarify if pt is still taking Amlodipine.  It looks like this medication was stopped in 6/5 phone call but the medication is still in chart.

## 2012-09-01 NOTE — Telephone Encounter (Signed)
Pt daughter was informed.

## 2012-09-24 ENCOUNTER — Other Ambulatory Visit: Payer: Self-pay

## 2012-10-13 ENCOUNTER — Ambulatory Visit: Payer: Medicare Other | Admitting: Cardiovascular Disease

## 2012-10-31 ENCOUNTER — Other Ambulatory Visit: Payer: Self-pay | Admitting: Family Medicine

## 2012-10-31 ENCOUNTER — Ambulatory Visit
Admission: RE | Admit: 2012-10-31 | Discharge: 2012-10-31 | Disposition: A | Discharge status: Home / Self Care | Payer: Medicare Other | Source: Ambulatory Visit | Attending: Family Medicine | Admitting: Family Medicine

## 2012-10-31 DIAGNOSIS — M545 Low back pain: Secondary | ICD-10-CM

## 2012-11-18 ENCOUNTER — Ambulatory Visit (HOSPITAL_COMMUNITY): Payer: Medicare Other | Admitting: Licensed Clinical Social Worker

## 2012-12-25 ENCOUNTER — Other Ambulatory Visit: Payer: Self-pay

## 2013-05-13 ENCOUNTER — Encounter (HOSPITAL_COMMUNITY): Payer: Self-pay | Admitting: Emergency Medicine

## 2013-05-13 ENCOUNTER — Emergency Department (HOSPITAL_COMMUNITY): Payer: Medicare Other

## 2013-05-13 ENCOUNTER — Emergency Department (HOSPITAL_COMMUNITY)
Admission: EM | Admit: 2013-05-13 | Discharge: 2013-05-13 | Disposition: A | Discharge status: Home / Self Care | Payer: Medicare Other | Attending: Emergency Medicine | Admitting: Emergency Medicine

## 2013-05-13 DIAGNOSIS — R0609 Other forms of dyspnea: Secondary | ICD-10-CM | POA: Insufficient documentation

## 2013-05-13 DIAGNOSIS — Z043 Encounter for examination and observation following other accident: Secondary | ICD-10-CM | POA: Insufficient documentation

## 2013-05-13 DIAGNOSIS — R350 Frequency of micturition: Secondary | ICD-10-CM | POA: Insufficient documentation

## 2013-05-13 DIAGNOSIS — Y9389 Activity, other specified: Secondary | ICD-10-CM | POA: Insufficient documentation

## 2013-05-13 DIAGNOSIS — F1020 Alcohol dependence, uncomplicated: Secondary | ICD-10-CM | POA: Insufficient documentation

## 2013-05-13 DIAGNOSIS — R0600 Dyspnea, unspecified: Secondary | ICD-10-CM

## 2013-05-13 DIAGNOSIS — R296 Repeated falls: Secondary | ICD-10-CM | POA: Insufficient documentation

## 2013-05-13 DIAGNOSIS — I10 Essential (primary) hypertension: Secondary | ICD-10-CM | POA: Insufficient documentation

## 2013-05-13 DIAGNOSIS — R0989 Other specified symptoms and signs involving the circulatory and respiratory systems: Principal | ICD-10-CM | POA: Insufficient documentation

## 2013-05-13 DIAGNOSIS — Z79899 Other long term (current) drug therapy: Secondary | ICD-10-CM | POA: Insufficient documentation

## 2013-05-13 DIAGNOSIS — Y929 Unspecified place or not applicable: Secondary | ICD-10-CM | POA: Insufficient documentation

## 2013-05-13 DIAGNOSIS — E7800 Pure hypercholesterolemia, unspecified: Secondary | ICD-10-CM | POA: Insufficient documentation

## 2013-05-13 DIAGNOSIS — R609 Edema, unspecified: Secondary | ICD-10-CM | POA: Insufficient documentation

## 2013-05-13 LAB — COMPREHENSIVE METABOLIC PANEL
ALBUMIN: 3.7 g/dL (ref 3.5–5.2)
ALK PHOS: 80 U/L (ref 39–117)
ALT: 21 U/L (ref 0–53)
AST: 58 U/L — AB (ref 0–37)
BILIRUBIN TOTAL: 0.9 mg/dL (ref 0.3–1.2)
BUN: 11 mg/dL (ref 6–23)
CHLORIDE: 103 meq/L (ref 96–112)
CO2: 27 mEq/L (ref 19–32)
Calcium: 9.2 mg/dL (ref 8.4–10.5)
Creatinine, Ser: 0.68 mg/dL (ref 0.50–1.35)
GFR calc Af Amer: >90 mL/min (ref >90)
GFR calc non Af Amer: >90 mL/min (ref >90)
Glucose, Bld: 117 mg/dL — ABNORMAL HIGH (ref 70–99)
POTASSIUM: 3.8 meq/L (ref 3.7–5.3)
Sodium: 145 mEq/L (ref 137–147)
Total Protein: 7.9 g/dL (ref 6.0–8.3)

## 2013-05-13 LAB — URINALYSIS, ROUTINE W REFLEX MICROSCOPIC
BILIRUBIN URINE: NEGATIVE
Glucose, UA: NEGATIVE mg/dL
HGB URINE DIPSTICK: NEGATIVE
Ketones, ur: NEGATIVE mg/dL
Leukocytes, UA: NEGATIVE
Nitrite: NEGATIVE
PH: 6 (ref 5.0–8.0)
Protein, ur: NEGATIVE mg/dL
SPECIFIC GRAVITY, URINE: 1.006 (ref 1.005–1.030)
Urobilinogen, UA: 0.2 mg/dL (ref 0.0–1.0)

## 2013-05-13 LAB — CBC WITH DIFFERENTIAL/PLATELET
BASOS PCT: 1 % (ref 0–1)
Basophils Absolute: 0 10*3/uL (ref 0.0–0.1)
Eosinophils Absolute: 0.3 10*3/uL (ref 0.0–0.7)
Eosinophils Relative: 4 % (ref 0–5)
HCT: 35.3 % — ABNORMAL LOW (ref 39.0–52.0)
HEMOGLOBIN: 12.1 g/dL — AB (ref 13.0–17.0)
LYMPHS PCT: 42 % (ref 12–46)
Lymphs Abs: 3 10*3/uL (ref 0.7–4.0)
MCH: 34 pg (ref 26.0–34.0)
MCHC: 34.3 g/dL (ref 30.0–36.0)
MCV: 99.2 fL (ref 78.0–100.0)
MONOS PCT: 10 % (ref 3–12)
Monocytes Absolute: 0.7 10*3/uL (ref 0.1–1.0)
NEUTROS ABS: 3.1 10*3/uL (ref 1.7–7.7)
NEUTROS PCT: 43 % (ref 43–77)
Platelets: 132 10*3/uL — ABNORMAL LOW (ref 150–400)
RBC: 3.56 MIL/uL — AB (ref 4.22–5.81)
RDW: 15.2 % (ref 11.5–15.5)
WBC: 7.1 10*3/uL (ref 4.0–10.5)

## 2013-05-13 LAB — LIPASE, BLOOD: Lipase: 31 U/L (ref 11–59)

## 2013-05-13 LAB — PRO B NATRIURETIC PEPTIDE: Pro B Natriuretic peptide (BNP): 29.5 pg/mL (ref 0–125)

## 2013-05-13 LAB — ETHANOL: Alcohol, Ethyl (B): 278 mg/dL — ABNORMAL HIGH (ref 0–11)

## 2013-05-13 NOTE — ED Notes (Signed)
Per ems they were called out for a fall, pt states that he didn't really fall, ems says that patient told them he walked the dog at 2am and staggered and that he's been drinking, he was telling them that he wants to quit drinking, pt looks to be short of breath but has no complaints.

## 2013-05-13 NOTE — ED Provider Notes (Signed)
CSN: [id number]     Arrival date & time 05/13/13  0351 History   First MD Initiated Contact with Patient 05/13/13 0422     Chief Complaint  Patient presents with  . Weakness     (Consider location/radiation/quality/duration/timing/severity/associated sxs/prior Treatment) HPI 73 year old male presents to emergency department from home with complaint of weakness after a fall tonight , abnormal breathing, increased urination, swelling of the legs.  Patient has history of hypertension, angioedema from lisinopril requiring tracheostomy, and elevated cholesterol.  Patient is a chronic drinker, has been drinking tonight.  Patient reports he was leaning over in his closet and fell, and had to struggle to get to his feet.  He reports that his feet have been swelling over the last month, along with increased urination.  Patient has dyspnea on exertion, but denies any chest pain.  Daughter reports patient appears to be short of breath most of the time, with some belly breathing.  He has not previously had difficulties breathing since having tracheostomy done   Daughter reports that he has problems talking and breathing at the same time. Past Medical History  Diagnosis Date  . HTN (hypertension)   . Hypercholesterolemia    Past Surgical History  Procedure Laterality Date  . Tracheostomy  05/22/12    emergent, angioedema  . Tracheostomy tube placement N/A 05/31/2012    Procedure: TRACHEOSTOMY;  Surgeon: Flo Shanks, MD;  Location: [facility] OR;  Service: ENT;  Laterality: N/A;  . Laryngoscopy N/A 05/31/2012    Procedure: LARYNGOSCOPY;  Surgeon: Flo Shanks, MD;  Location: [facility] OR;  Service: ENT;  Laterality: N/A;  . Laryngoscopy N/A 06/19/2012    Procedure: DIRECT LARYNGOSCOPY AND BRONCHOSCOPY;  Surgeon: [name], MD;  Location: [facility] OR;  Service: ENT;  Laterality: N/A;  . Tracheostomy revision N/A 06/19/2012    Procedure: TRACHEOSTOMY DECANNULATION;  Surgeon: [name], MD;  Location: [facility] OR;  Service: ENT;   Laterality: N/A;  MICROSCOPE/TELESCOPE TRACHEOSTOMY REVISION   History reviewed. No pertinent family history. History  Substance Use Topics  . Smoking status: Never Smoker   . Smokeless tobacco: Not on file  . Alcohol Use: 7.0 oz/week    14 drink(s) per week     Comment: drinks daily    Review of Systems   See History of Present Illness; otherwise all other systems are reviewed and negative  Allergies  Lisinopril and Amlodipine  Home Medications   Current Outpatient Rx  Name  Route  Sig  Dispense  Refill  . albuterol (PROVENTIL HFA;VENTOLIN HFA) 108 (90 BASE) MCG/ACT inhaler   Inhalation   Inhale 2 puffs into the lungs every 6 (six) hours as needed for wheezing.   1 Inhaler   2   . carvedilol (COREG) 25 MG tablet   Oral   Take 25 mg by mouth 2 (two) times daily with a meal.         . folic acid (FOLVITE) 1 MG tablet   Oral   Take 1 tablet (1 mg total) by mouth daily.         Marland Kitchen ipratropium (ATROVENT) 0.06 % nasal spray   Nasal   Place 2 sprays into the nose 4 (four) times daily.         Marland Kitchen LORazepam (ATIVAN) 0.5 MG tablet   Oral   Take 0.5 mg by mouth every 8 (eight) hours as needed for anxiety.          . mometasone (NASONEX) 50 MCG/ACT nasal spray   Nasal  Place 2 sprays into the nose daily as needed.          . thiamine 100 MG tablet   Oral   Take 1 tablet (100 mg total) by mouth daily.         . traMADol (ULTRAM) 50 MG tablet   Oral   Take 50 mg by mouth every 6 (six) hours as needed for moderate pain.         Marland Kitchen. atorvastatin (LIPITOR) 40 MG tablet   Oral   Take 40 mg by mouth every morning.         Marland Kitchen. EPINEPHrine (EPI-PEN) 0.3 mg/0.3 mL DEVI   Intramuscular   Inject 0.3 mg into the muscle as needed.          BP 153/82  Pulse 80  Resp 17  SpO2 98% Physical Exam  Nursing note and vitals reviewed. Constitutional: He is oriented to person, place, and time. He appears well-developed and well-nourished.  HENT:  Head:  Normocephalic and atraumatic.  Right Ear: External ear normal.  Left Ear: External ear normal.  Nose: Nose normal.  Mouth/Throat: Oropharynx is clear and moist.  Eyes: Conjunctivae and EOM are normal. Pupils are equal, round, and reactive to light.  Neck: Normal range of motion. Neck supple. No JVD present. No tracheal deviation present. No thyromegaly present.  Prior tracheostomy scar noted  Cardiovascular: Normal rate, regular rhythm, normal heart sounds and intact distal pulses.  Exam reveals no gallop and no friction rub.   No murmur heard. Pulmonary/Chest: Breath sounds normal. No stridor. He is in respiratory distress (mild). He has no wheezes. He has no rales. He exhibits no tenderness.  Patient has some abdominal breathing noted.  He is dyspneic with short phrases  Abdominal: Soft. Bowel sounds are normal. He exhibits no distension and no mass. There is no tenderness. There is no rebound and no guarding.  Musculoskeletal: Normal range of motion. He exhibits edema. He exhibits no tenderness.  Lymphadenopathy:    He has no cervical adenopathy.  Neurological: He is alert and oriented to person, place, and time. He has normal reflexes. No cranial nerve deficit. He exhibits normal muscle tone. Coordination normal.  Skin: Skin is warm and dry. No rash noted. No erythema. No pallor.  Psychiatric: He has a normal mood and affect. His behavior is normal. Judgment and thought content normal.    ED Course  Procedures (including critical care time) Labs Review Labs Reviewed  CBC WITH DIFFERENTIAL - Abnormal; Notable for the following:    RBC 3.56 (*)    Hemoglobin 12.1 (*)    HCT 35.3 (*)    Platelets 132 (*)    All other components within normal limits  COMPREHENSIVE METABOLIC PANEL - Abnormal; Notable for the following:    Glucose, Bld 117 (*)    AST 58 (*)    All other components within normal limits  ETHANOL - Abnormal; Notable for the following:    Alcohol, Ethyl (B) 278 (*)     All other components within normal limits  LIPASE, BLOOD  URINALYSIS, ROUTINE W REFLEX MICROSCOPIC  PRO B NATRIURETIC PEPTIDE   Imaging Review Dg Chest 2 View  05/13/2013   CLINICAL DATA:  Chest pain, shortness of breath.  EXAM: CHEST  2 VIEW  COMPARISON:  DG CHEST 1V PORT dated 06/17/2012 cardiac silhouette appears mildly enlarged,  FINDINGS: Cardiac silhouette remains mildly enlarged, mediastinal silhouette is unremarkable. Low inspiratory examination. Similar central pulmonary vasculature congestion without pleural effusions or  focal consolidations. No pneumothorax.  Stab tissue planes and included osseous structures are nonsuspicious, mild degenerative change of thoracic spine.  IMPRESSION: Stable mild cardiomegaly and central pulmonary vasculature congestion.   Electronically Signed   By: Awilda Metro   On: 05/13/2013 05:57     EKG Interpretation   Date/Time:  Wednesday May 13 2013 04:03:15 EDT Ventricular Rate:  82 PR Interval:  199 QRS Duration: 96 QT Interval:  398 QTC Calculation: 465 R Axis:   -51 Text Interpretation:  Sinus rhythm Inferior infarct, old Anterior infarct,  old Since last tracing rate slower Confirmed by Buffey Zabinski  MD, Danna Casella ([id number]) on  05/13/2013 5:01:27 AM      MDM   Final diagnoses:  Dyspnea  Alcoholism  Peripheral edema    73 year old male with dyspnea on exertion, dyspnea, with prolonged talking, chronic alcoholism, and a fall tonight.  Patient did not strike his head, no LOC.  Concern for possible congestive heart failure causing symptoms.  Workup shows elevated alcohol level, normal BNP, chest x-ray with cardiomegaly and pulmonary vascular congestion, but stable.  No signs of florid pulmonary edema.  Will ambulate patient without oxygen to see levels of his O2, if he continues to drop may need CT angioma of chest to evaluate for reasons for hypoxemia admission to the hospital for echo.  7:25 AM No hypoxia with ambulation.  Will d/c to f/u with  pcm.  Olivia Mackie, MD 05/13/13 [phone]

## 2013-05-13 NOTE — Discharge Instructions (Signed)
It is very important for you to cut back or limit any alcohol completely.  For your shortness of breath, and swelling in your legs, please followup with your primary care Dr. for further workup.  You may need referral to a cardiologist for an echocardiogram.  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  Use the handouts below.  Return to the emergency room for worsening condition or new concerning symptoms.   Alcohol and Nutrition Nutrition serves two purposes. It provides energy. It also maintains body structure and function. Food supplies energy. It also provides the building blocks needed to replace worn or damaged cells. Alcoholics often eat poorly. This limits their supply of essential nutrients. This affects energy supply and structure maintenance. Alcohol also affects the body's nutrients in:  Digestion.  Storage.  Using and getting rid of waste products. IMPAIRMENT OF NUTRIENT DIGESTION AND UTILIZATION   Once ingested, food must be broken down into small components (digested). Then it is available for energy. It helps maintain body structure and function. Digestion begins in the mouth. It continues in the stomach and intestines, with help from the pancreas. The nutrients from digested food are absorbed from the intestines into the blood. Then they are carried to the liver. The liver prepares nutrients for:  Immediate use.  Storage and future use.  Alcohol inhibits the breakdown of nutrients into usable molecules.  It decreases secretion of digestive enzymes from the pancreas.  Alcohol impairs nutrient absorption by damaging the cells lining the stomach and intestines.  It also interferes with moving some nutrients into the blood.  In addition, nutritional deficiencies themselves may lead to further absorption problems.  For example, folate deficiency  changes the cells that line the small intestine. This impairs how water is absorbed. It also affects absorbed nutrients. These include glucose, sodium, and additional folate.  Even if nutrients are digested and absorbed, alcohol can prevent them from being fully used. It changes their transport, storage, and excretion. Impaired utilization of nutrients by alcoholics is indicated by:  Decreased liver stores of vitamins, such as vitamin A.  Increased excretion of nutrients such as fat. ALCOHOL AND ENERGY SUPPLY   Three basic nutritional components found in food are:  Carbohydrates.  Proteins.  Fats.  These are used as energy. Some alcoholics take in as much as 50% of their total daily calories from alcohol. They often neglect important foods.  Even when enough food is eaten, alcohol can impair the ways the body controls blood sugar (glucose) levels. It may either increase or decrease blood sugar.  In non-diabetic alcoholics, increased blood sugar (hyperglycemia) is caused by poor insulin secretion. It is usually temporary.  Decreased blood sugar (hypoglycemia) can cause serious injury even if this condition is short-lived. Low blood sugar can happen when a fasting or malnourished person drinks alcohol. When there is no food to supply energy, stored sugar is used up. The products of alcohol inhibit forming glucose from other compounds such as amino acids. As a result, alcohol causes the brain and other body tissue to lack glucose. It is needed for energy and function.  Alcohol is an energy source. But how the body processes and uses the energy from alcohol is complex. Also, when alcohol is substituted for carbohydrates, subjects tend to lose weight. This indicates that they get less energy from alcohol than from food. ALCOHOL - MAINTAINING CELL STRUCTURE  AND FUNCTION  Structure Cells are made mostly of protein. So an adequate protein diet is important for maintaining cell structure. This is  especially true if cells are being damaged. Research indicates that alcohol affects protein nutrition by causing impaired:  Digestion of proteins to amino acids.  Processing of amino acids by the small intestine and liver.  Synthesis of proteins from amino acids.  Protein secretion by the liver. Function Nutrients are essential for the body to function well. They provide the tools that the body needs to work well:   Proteins.  Vitamins.  Minerals. Alcohol can disrupt body function. It may cause nutrient deficiencies. And it may interfere with the way nutrients are processed. Vitamins  Vitamins are essential to maintain growth and normal metabolism. They regulate many of the body`s processes. Chronic heavy drinking causes deficiencies in many vitamins. This is caused by eating less. And, in some cases, vitamins may be poorly absorbed. For example, alcohol inhibits fat absorption. It impairs how the vitamins A, E, and D are normally absorbed along with dietary fats. Not enough vitamin A may cause night blindness. Not enough vitamin D may cause softening of the bones.  Some alcoholics lack vitamins A, C, D, E, K, and the B vitamins. These are all involved in wound healing and cell maintenance. In particular, because vitamin K is necessary for blood clotting, lacking that vitamin can cause delayed clotting. The result is excess bleeding. Lacking other vitamins involved in brain function may cause severe neurological damage. Minerals Deficiencies of minerals such as calcium, magnesium, iron, and zinc are common in alcoholics. The alcohol itself does not seem to affect how these minerals are absorbed. Rather, they seem to occur secondary to other alcohol-related problems, such as:  Less calcium absorbed.  Not enough magnesium.  More urinary excretion.  Vomiting.  Diarrhea.  Not enough iron due to gastrointestinal bleeding.  Not enough zinc or losses related to other nutrient  deficiencies.  Mineral deficiencies can cause a variety of medical consequences. These range from calcium-related bone disease to zinc-related night blindness and skin lesions. ALCOHOL, MALNUTRITION, AND MEDICAL COMPLICATIONS  Liver Disease   Alcoholic liver damage is caused primarily by alcohol itself. But poor nutrition may increase the risk of alcohol-related liver damage. For example, nutrients normally found in the liver are known to be affected by drinking alcohol. These include carotenoids, which are the major sources of vitamin A, and vitamin E compounds. Decreases in such nutrients may play some role in alcohol-related liver damage. Pancreatitis  Research suggests that malnutrition may increase the risk of developing alcoholic pancreatitis. Research suggests that a diet lacking in protein may increase alcohol's damaging effect on the pancreas. Brain  Nutritional deficiencies may have severe effects on brain function. These may be permanent. Specifically, thiamine deficiencies are often seen in alcoholics. They can cause severe neurological problems. These include:  Impaired movement.  Memory loss seen in Wernicke-Korsakoff syndrome. Pregnancy  Alcohol has toxic effects on fetal development. It causes alcohol-related birth defects. They include fetal alcohol syndrome. Alcohol itself is toxic to the fetus. Also, the nutritional deficiency can affect how the fetus develops. That may compound the risk of developmental damage.  Nutritional needs during pregnancy are 10% to 30% greater than normal. Food intake can increase by as much as 140% to cover the needs of both mother and fetus. An alcoholic mother`s nutritional problems may adversely affect the nutrition of the fetus. And alcohol itself can also restrict nutrition flow to the fetus.  NUTRITIONAL STATUS OF ALCOHOLICS  Techniques for assessing nutritional status include:  Taking body measurements to estimate fat reserves. They  include:  Weight.  Height.  Mass.  Skin fold thickness.  Performing blood analysis to provide measurements of circulating:  Proteins.  Vitamins.  Minerals.  These techniques tend to be imprecise. For many nutrients, there is no clear "cut-off" point that would allow an accurate definition of deficiency. So assessing the nutritional status of alcoholics is limited by these techniques. Dietary status may provide information about the risk of developing nutritional problems. Dietary status is assessed by:  Taking patients' dietary histories.  Evaluating the amount and types of food they are eating.  It is difficult to determine what exact amount of alcohol begins to have damaging effects on nutrition. In general, moderate drinkers have 2 drinks or less per day. They seem to be at little risk for nutritional problems. Various medical disorders begin to appear at greater levels.  Research indicates that the majority of even the heaviest drinkers have few obvious nutritional deficiencies. Many alcoholics who are hospitalized for medical complications of their disease do have severe malnutrition. Alcoholics tend to eat poorly. Often they eat less than the amounts of food necessary to provide enough:  Carbohydrates.  Protein.  Fat.  Vitamins A and C.  B vitamins.  Minerals like calcium and iron. Of major concern is alcohol's effect on digesting food and use of nutrients. It may shift a mildly malnourished person toward severe malnutrition. Document Released: 11/30/2004 Document Revised: 04/30/2011 Document Reviewed: 05/16/2005 [facility] Patient Information 2014 [place], [place].   Edema Edema is an abnormal build-up of fluids in tissues. Because this is partly dependent on gravity (water flows to the lowest place), it is more common in the legs and thighs (lower extremities). It is also common in the looser tissues, like around the eyes. Painless swelling of the feet and ankles is  common and increases as a person ages. It may affect both legs and may include the calves or even thighs. When squeezed, the fluid may move out of the affected area and may leave a dent for a few moments. CAUSES   Prolonged standing or sitting in one place for extended periods of time. Movement helps pump tissue fluid into the veins, and absence of movement prevents this, resulting in edema.  Varicose veins. The valves in the veins do not work as well as they should. This causes fluid to leak into the tissues.  Fluid and salt overload.  Injury, burn, or surgery to the leg, ankle, or foot, may damage veins and allow fluid to leak out.  Sunburn damages vessels. Leaky vessels allow fluid to go out into the sunburned tissues.  Allergies (from insect bites or stings, medications or chemicals) cause swelling by allowing vessels to become leaky.  Protein in the blood helps keep fluid in your vessels. Low protein, as in malnutrition, allows fluid to leak out.  Hormonal changes, including pregnancy and menstruation, cause fluid retention. This fluid may leak out of vessels and cause edema.  Medications that cause fluid retention. Examples are sex hormones, blood pressure medications, steroid treatment, or anti-depressants.  Some illnesses cause edema, especially heart failure, kidney disease, or liver disease.  Surgery that cuts veins or lymph nodes, such as surgery done for the heart or for breast cancer, may result in edema. DIAGNOSIS  Your caregiver is usually easily able to determine what is causing your swelling (edema) by simply asking what is wrong (getting a  history) and examining you (doing a physical). Sometimes x-rays, EKG (electrocardiogram or heart tracing), and blood work may be done to evaluate for underlying medical illness. TREATMENT  General treatment includes:  Leg elevation (or elevation of the affected body part).  Restriction of fluid intake.  Prevention of fluid  overload.  Compression of the affected body part. Compression with elastic bandages or support stockings squeezes the tissues, preventing fluid from entering and forcing it back into the blood vessels.  Diuretics (also called water pills or fluid pills) pull fluid out of your body in the form of increased urination. These are effective in reducing the swelling, but can have side effects and must be used only under your caregiver's supervision. Diuretics are appropriate only for some types of edema. The specific treatment can be directed at any underlying causes discovered. Heart, liver, or kidney disease should be treated appropriately. HOME CARE INSTRUCTIONS   Elevate the legs (or affected body part) above the level of the heart, while lying down.  Avoid sitting or standing still for prolonged periods of time.  Avoid putting anything directly under the knees when lying down, and do not wear constricting clothing or garters on the upper legs.  Exercising the legs causes the fluid to work back into the veins and lymphatic channels. This may help the swelling go down.  The pressure applied by elastic bandages or support stockings can help reduce ankle swelling.  A low-salt diet may help reduce fluid retention and decrease the ankle swelling.  Take any medications exactly as prescribed. SEEK MEDICAL CARE IF:  Your edema is not responding to recommended treatments. SEEK IMMEDIATE MEDICAL CARE IF:   You develop shortness of breath or chest pain.  You cannot breathe when you lay down; or if, while lying down, you have to get up and go to the window to get your breath.  You are having increasing swelling without relief from treatment.  You develop a fever over 102 F (38.9 C).  You develop pain or redness in the areas that are swollen.  Tell your caregiver right away if you have gained 03 lb/1.4 kg in 1 day or 05 lb/2.3 kg in a week. MAKE SURE YOU:   Understand these  instructions.  Will watch your condition.  Will get help right away if you are not doing well or get worse. Document Released: 02/05/2005 Document Revised: 08/07/2011 Document Reviewed: 09/24/2007 [facility] Patient Information 2014 [place], [place].  Shortness of Breath Shortness of breath means you have trouble breathing. Shortness of breath may indicate that you have a medical problem. You should seek immediate medical care for shortness of breath. CAUSES   Not enough oxygen in the air (as with high altitudes or a smoke-filled room).  Short-term (acute) lung disease, including:  Infections, such as pneumonia.  Fluid in the lungs, such as heart failure.  A blood clot in the lungs (pulmonary embolism).  Long-term (chronic) lung diseases.  Heart disease (heart attack, angina, heart failure, and others).  Low red blood cells (anemia).  Poor physical fitness. This can cause shortness of breath when you exercise.  Chest or back injuries or stiffness.  Being overweight.  Smoking.  Anxiety. This can make you feel like you are not getting enough air. DIAGNOSIS  Serious medical problems can usually be found during your physical exam. Tests may also be done to determine why you are having shortness of breath. Tests may include:  Chest X-rays.  Lung function tests.  Blood tests.  Electrocardiography.  Exercise testing.  Echocardiography.  Imaging scans. Your caregiver may not be able to find a cause for your shortness of breath after your exam. In this case, it is important to have a follow-up exam with your caregiver as directed.  TREATMENT  Treatment for shortness of breath depends on the cause of your symptoms and can vary greatly. HOME CARE INSTRUCTIONS   Do not smoke. Smoking is a common cause of shortness of breath. If you smoke, ask for help to quit.  Avoid being around chemicals or things that may bother your breathing, such as paint fumes and dust.  Rest as  needed. Slowly resume your usual activities.  If medicines were prescribed, take them as directed for the full length of time directed. This includes oxygen and any inhaled medicines.  Keep all follow-up appointments as directed by your caregiver. SEEK MEDICAL CARE IF:   Your condition does not improve in the time expected.  You have a hard time doing your normal activities even with rest.  You have any side effects or problems with the medicines prescribed.  You develop any new symptoms. SEEK IMMEDIATE MEDICAL CARE IF:   Your shortness of breath gets worse.  You feel lightheaded, faint, or develop a cough not controlled with medicines.  You start coughing up blood.  You have pain with breathing.  You have chest pain or pain in your arms, shoulders, or abdomen.  You have a fever.  You are unable to walk up stairs or exercise the way you normally do. MAKE SURE YOU:  Understand these instructions.  Will watch your condition.  Will get help right away if you are not doing well or get worse. Document Released: 10/31/2000 Document Revised: 08/07/2011 Document Reviewed: 04/23/2011 [facility] Patient Information 2014 [place], [place].

## 2013-05-13 NOTE — ED Notes (Signed)
Bed: WA16 Expected date:  Expected time:  Means of arrival:  Comments: EMS 

## 2013-05-13 NOTE — ED Notes (Signed)
Pt states that he was bending over this am and fell, no complaints of any pain, pt admits to drinking vodka tonight, but states that's not why he fell.

## 2013-07-17 ENCOUNTER — Other Ambulatory Visit (HOSPITAL_COMMUNITY): Payer: Self-pay | Admitting: Internal Medicine

## 2013-07-17 ENCOUNTER — Ambulatory Visit (HOSPITAL_COMMUNITY)
Admission: RE | Admit: 2013-07-17 | Discharge: 2013-07-17 | Disposition: A | Discharge status: Home / Self Care | Payer: Medicare Other | Source: Ambulatory Visit | Attending: Internal Medicine | Admitting: Internal Medicine

## 2013-07-17 DIAGNOSIS — R0600 Dyspnea, unspecified: Secondary | ICD-10-CM

## 2013-07-17 DIAGNOSIS — I517 Cardiomegaly: Secondary | ICD-10-CM

## 2013-07-17 DIAGNOSIS — R600 Localized edema: Secondary | ICD-10-CM

## 2013-07-17 DIAGNOSIS — R0609 Other forms of dyspnea: Secondary | ICD-10-CM | POA: Insufficient documentation

## 2013-07-17 DIAGNOSIS — R0989 Other specified symptoms and signs involving the circulatory and respiratory systems: Principal | ICD-10-CM | POA: Insufficient documentation

## 2013-07-17 DIAGNOSIS — R609 Edema, unspecified: Secondary | ICD-10-CM | POA: Insufficient documentation

## 2013-07-17 NOTE — Progress Notes (Signed)
2D Echo Performed 07/17/2013    Lindzy Rupert, RCS  

## 2013-07-20 ENCOUNTER — Ambulatory Visit
Admission: RE | Admit: 2013-07-20 | Discharge: 2013-07-20 | Disposition: A | Discharge status: Home / Self Care | Payer: Medicare Other | Source: Ambulatory Visit | Attending: Internal Medicine | Admitting: Internal Medicine

## 2013-07-20 ENCOUNTER — Other Ambulatory Visit: Payer: Self-pay | Admitting: Internal Medicine

## 2013-07-20 DIAGNOSIS — R0609 Other forms of dyspnea: Principal | ICD-10-CM

## 2013-08-19 ENCOUNTER — Other Ambulatory Visit: Payer: Self-pay | Admitting: Cardiovascular Disease

## 2013-09-30 ENCOUNTER — Other Ambulatory Visit: Payer: Self-pay | Admitting: Cardiovascular Disease

## 2013-11-14 ENCOUNTER — Other Ambulatory Visit: Payer: Self-pay | Admitting: Cardiovascular Disease

## 2013-12-04 ENCOUNTER — Other Ambulatory Visit: Payer: Self-pay

## 2013-12-13 ENCOUNTER — Other Ambulatory Visit: Payer: Self-pay | Admitting: Cardiovascular Disease

## 2014-03-27 ENCOUNTER — Other Ambulatory Visit: Payer: Self-pay | Admitting: Cardiovascular Disease

## 2014-07-21 DIAGNOSIS — K7460 Unspecified cirrhosis of liver: Secondary | ICD-10-CM

## 2014-07-21 HISTORY — DX: Unspecified cirrhosis of liver: K74.60

## 2014-08-03 ENCOUNTER — Inpatient Hospital Stay (HOSPITAL_COMMUNITY)
Admission: EM | Admit: 2014-08-03 | Discharge: 2014-08-16 | DRG: 981 | Disposition: A | Discharge status: Home Health | Payer: Medicare Other | Attending: Internal Medicine | Admitting: Internal Medicine

## 2014-08-03 ENCOUNTER — Encounter (HOSPITAL_COMMUNITY)
Admission: EM | Disposition: A | Discharge status: Home Health | Payer: Self-pay | Source: Home / Self Care | Attending: Internal Medicine

## 2014-08-03 ENCOUNTER — Inpatient Hospital Stay (HOSPITAL_COMMUNITY): Payer: Medicare Other

## 2014-08-03 ENCOUNTER — Encounter (HOSPITAL_COMMUNITY): Payer: Self-pay | Admitting: Emergency Medicine

## 2014-08-03 DIAGNOSIS — F10231 Alcohol dependence with withdrawal delirium: Secondary | ICD-10-CM | POA: Diagnosis present

## 2014-08-03 DIAGNOSIS — K649 Unspecified hemorrhoids: Secondary | ICD-10-CM | POA: Insufficient documentation

## 2014-08-03 DIAGNOSIS — E870 Hyperosmolality and hypernatremia: Secondary | ICD-10-CM | POA: Diagnosis present

## 2014-08-03 DIAGNOSIS — Z823 Family history of stroke: Secondary | ICD-10-CM | POA: Diagnosis not present

## 2014-08-03 DIAGNOSIS — D6959 Other secondary thrombocytopenia: Secondary | ICD-10-CM | POA: Diagnosis present

## 2014-08-03 DIAGNOSIS — I10 Essential (primary) hypertension: Secondary | ICD-10-CM | POA: Diagnosis present

## 2014-08-03 DIAGNOSIS — E876 Hypokalemia: Secondary | ICD-10-CM | POA: Diagnosis not present

## 2014-08-03 DIAGNOSIS — R52 Pain, unspecified: Secondary | ICD-10-CM

## 2014-08-03 DIAGNOSIS — K7682 Hepatic encephalopathy: Secondary | ICD-10-CM | POA: Diagnosis present

## 2014-08-03 DIAGNOSIS — K7290 Hepatic failure, unspecified without coma: Secondary | ICD-10-CM | POA: Diagnosis present

## 2014-08-03 DIAGNOSIS — K3189 Other diseases of stomach and duodenum: Secondary | ICD-10-CM | POA: Diagnosis present

## 2014-08-03 DIAGNOSIS — K7030 Alcoholic cirrhosis of liver without ascites: Secondary | ICD-10-CM | POA: Diagnosis not present

## 2014-08-03 DIAGNOSIS — I868 Varicose veins of other specified sites: Secondary | ICD-10-CM | POA: Diagnosis present

## 2014-08-03 DIAGNOSIS — K5730 Diverticulosis of large intestine without perforation or abscess without bleeding: Secondary | ICD-10-CM | POA: Diagnosis present

## 2014-08-03 DIAGNOSIS — I8511 Secondary esophageal varices with bleeding: Secondary | ICD-10-CM | POA: Diagnosis present

## 2014-08-03 DIAGNOSIS — Z8674 Personal history of sudden cardiac arrest: Secondary | ICD-10-CM | POA: Diagnosis not present

## 2014-08-03 DIAGNOSIS — F1010 Alcohol abuse, uncomplicated: Secondary | ICD-10-CM | POA: Diagnosis present

## 2014-08-03 DIAGNOSIS — E780 Pure hypercholesterolemia: Secondary | ICD-10-CM | POA: Diagnosis present

## 2014-08-03 DIAGNOSIS — I5032 Chronic diastolic (congestive) heart failure: Secondary | ICD-10-CM | POA: Diagnosis present

## 2014-08-03 DIAGNOSIS — G9340 Encephalopathy, unspecified: Secondary | ICD-10-CM | POA: Diagnosis not present

## 2014-08-03 DIAGNOSIS — K7460 Unspecified cirrhosis of liver: Secondary | ICD-10-CM

## 2014-08-03 DIAGNOSIS — E785 Hyperlipidemia, unspecified: Secondary | ICD-10-CM | POA: Diagnosis present

## 2014-08-03 DIAGNOSIS — K7031 Alcoholic cirrhosis of liver with ascites: Principal | ICD-10-CM | POA: Diagnosis present

## 2014-08-03 DIAGNOSIS — R140 Abdominal distension (gaseous): Secondary | ICD-10-CM

## 2014-08-03 DIAGNOSIS — K56609 Unspecified intestinal obstruction, unspecified as to partial versus complete obstruction: Secondary | ICD-10-CM | POA: Diagnosis not present

## 2014-08-03 DIAGNOSIS — K560 Paralytic ileus: Secondary | ICD-10-CM

## 2014-08-03 DIAGNOSIS — D62 Acute posthemorrhagic anemia: Secondary | ICD-10-CM | POA: Diagnosis present

## 2014-08-03 DIAGNOSIS — R109 Unspecified abdominal pain: Secondary | ICD-10-CM

## 2014-08-03 DIAGNOSIS — Z803 Family history of malignant neoplasm of breast: Secondary | ICD-10-CM

## 2014-08-03 DIAGNOSIS — I8501 Esophageal varices with bleeding: Secondary | ICD-10-CM | POA: Insufficient documentation

## 2014-08-03 DIAGNOSIS — I8510 Secondary esophageal varices without bleeding: Secondary | ICD-10-CM | POA: Diagnosis present

## 2014-08-03 DIAGNOSIS — Z79899 Other long term (current) drug therapy: Secondary | ICD-10-CM | POA: Diagnosis not present

## 2014-08-03 DIAGNOSIS — K315 Obstruction of duodenum: Secondary | ICD-10-CM | POA: Diagnosis not present

## 2014-08-03 DIAGNOSIS — D72829 Elevated white blood cell count, unspecified: Secondary | ICD-10-CM | POA: Diagnosis not present

## 2014-08-03 DIAGNOSIS — R1030 Lower abdominal pain, unspecified: Secondary | ICD-10-CM

## 2014-08-03 DIAGNOSIS — K625 Hemorrhage of anus and rectum: Secondary | ICD-10-CM | POA: Diagnosis present

## 2014-08-03 DIAGNOSIS — Z888 Allergy status to other drugs, medicaments and biological substances status: Secondary | ICD-10-CM

## 2014-08-03 DIAGNOSIS — K922 Gastrointestinal hemorrhage, unspecified: Secondary | ICD-10-CM

## 2014-08-03 DIAGNOSIS — R278 Other lack of coordination: Secondary | ICD-10-CM | POA: Diagnosis present

## 2014-08-03 DIAGNOSIS — R4182 Altered mental status, unspecified: Secondary | ICD-10-CM

## 2014-08-03 DIAGNOSIS — I469 Cardiac arrest, cause unspecified: Secondary | ICD-10-CM | POA: Diagnosis present

## 2014-08-03 DIAGNOSIS — K567 Ileus, unspecified: Secondary | ICD-10-CM

## 2014-08-03 HISTORY — DX: Personal history of sudden cardiac arrest: Z86.74

## 2014-08-03 HISTORY — DX: Unspecified cirrhosis of liver: K74.60

## 2014-08-03 HISTORY — DX: Angioneurotic edema, initial encounter: T78.3XXA

## 2014-08-03 HISTORY — DX: Personal history of other diseases of the digestive system: Z87.19

## 2014-08-03 HISTORY — DX: Respiratory arrest: R09.2

## 2014-08-03 HISTORY — DX: Alcohol abuse, uncomplicated: F10.10

## 2014-08-03 HISTORY — DX: Unspecified atrial fibrillation: I48.91

## 2014-08-03 HISTORY — DX: Anemia, unspecified: D64.9

## 2014-08-03 LAB — HEMOGLOBIN AND HEMATOCRIT, BLOOD
HCT: 28.1 % — ABNORMAL LOW (ref 39.0–52.0)
HEMATOCRIT: 25.8 % — AB (ref 39.0–52.0)
HEMOGLOBIN: 9.5 g/dL — AB (ref 13.0–17.0)
Hemoglobin: 8.7 g/dL — ABNORMAL LOW (ref 13.0–17.0)

## 2014-08-03 LAB — BASIC METABOLIC PANEL
ANION GAP: 9 (ref 5–15)
BUN: 21 mg/dL — ABNORMAL HIGH (ref 6–20)
CHLORIDE: 107 mmol/L (ref 101–111)
CO2: 24 mmol/L (ref 22–32)
Calcium: 9 mg/dL (ref 8.9–10.3)
Creatinine, Ser: 0.56 mg/dL — ABNORMAL LOW (ref 0.61–1.24)
GFR calc non Af Amer: >60 mL/min (ref >60)
Glucose, Bld: 129 mg/dL — ABNORMAL HIGH (ref 65–99)
POTASSIUM: 4.1 mmol/L (ref 3.5–5.1)
SODIUM: 140 mmol/L (ref 135–145)

## 2014-08-03 LAB — CBC
HCT: 21.7 % — ABNORMAL LOW (ref 39.0–52.0)
Hemoglobin: 7.2 g/dL — ABNORMAL LOW (ref 13.0–17.0)
MCH: 34.3 pg — ABNORMAL HIGH (ref 26.0–34.0)
MCHC: 33.2 g/dL (ref 30.0–36.0)
MCV: 103.3 fL — ABNORMAL HIGH (ref 78.0–100.0)
PLATELETS: 262 10*3/uL (ref 150–400)
RBC: 2.1 MIL/uL — ABNORMAL LOW (ref 4.22–5.81)
RDW: 16.5 % — AB (ref 11.5–15.5)
WBC: 8.9 10*3/uL (ref 4.0–10.5)

## 2014-08-03 LAB — I-STAT CHEM 8, ED
BUN: 24 mg/dL — ABNORMAL HIGH (ref 6–20)
Calcium, Ion: 1.19 mmol/L (ref 1.13–1.30)
Chloride: 106 mmol/L (ref 101–111)
Creatinine, Ser: 0.5 mg/dL — ABNORMAL LOW (ref 0.61–1.24)
Glucose, Bld: 127 mg/dL — ABNORMAL HIGH (ref 65–99)
HEMATOCRIT: 24 % — AB (ref 39.0–52.0)
HEMOGLOBIN: 8.2 g/dL — AB (ref 13.0–17.0)
POTASSIUM: 4.1 mmol/L (ref 3.5–5.1)
Sodium: 143 mmol/L (ref 135–145)
TCO2: 22 mmol/L (ref 0–100)

## 2014-08-03 LAB — ETHANOL: Alcohol, Ethyl (B): <5 mg/dL (ref <5)

## 2014-08-03 LAB — HEPATIC FUNCTION PANEL
ALBUMIN: 2.9 g/dL — AB (ref 3.5–5.0)
ALK PHOS: 91 U/L (ref 38–126)
ALT: 34 U/L (ref 17–63)
AST: 84 U/L — ABNORMAL HIGH (ref 15–41)
BILIRUBIN INDIRECT: 1.4 mg/dL — AB (ref 0.3–0.9)
Bilirubin, Direct: 0.9 mg/dL — ABNORMAL HIGH (ref 0.1–0.5)
Total Bilirubin: 2.3 mg/dL — ABNORMAL HIGH (ref 0.3–1.2)
Total Protein: 6.7 g/dL (ref 6.5–8.1)

## 2014-08-03 LAB — CBG MONITORING, ED: GLUCOSE-CAPILLARY: 117 mg/dL — AB (ref 65–99)

## 2014-08-03 LAB — LIPASE, BLOOD: Lipase: 24 U/L (ref 22–51)

## 2014-08-03 LAB — POC OCCULT BLOOD, ED: Fecal Occult Bld: POSITIVE — AB

## 2014-08-03 LAB — I-STAT CG4 LACTIC ACID, ED: Lactic Acid, Venous: 1.7 mmol/L (ref 0.5–2.0)

## 2014-08-03 LAB — ABO/RH: ABO/RH(D): O POS

## 2014-08-03 LAB — PREPARE RBC (CROSSMATCH)

## 2014-08-03 LAB — PROTIME-INR
INR: 1.42 (ref 0.00–1.49)
Prothrombin Time: 17.4 seconds — ABNORMAL HIGH (ref 11.6–15.2)

## 2014-08-03 LAB — APTT: aPTT: 37 seconds (ref 24–37)

## 2014-08-03 LAB — GLUCOSE, CAPILLARY: Glucose-Capillary: 96 mg/dL (ref 65–99)

## 2014-08-03 LAB — AMMONIA: Ammonia: 122 umol/L — ABNORMAL HIGH (ref 9–35)

## 2014-08-03 LAB — MRSA PCR SCREENING: MRSA BY PCR: NEGATIVE

## 2014-08-03 LAB — BRAIN NATRIURETIC PEPTIDE: B NATRIURETIC PEPTIDE 5: 35.6 pg/mL (ref 0.0–100.0)

## 2014-08-03 SURGERY — CANCELLED PROCEDURE

## 2014-08-03 MED ORDER — SODIUM CHLORIDE 0.9 % IV SOLN
Freq: Once | INTRAVENOUS | Status: AC
Start: 2014-08-03 — End: 2014-08-03
  Administered 2014-08-03: 07:00:00 via INTRAVENOUS

## 2014-08-03 MED ORDER — THIAMINE HCL 100 MG/ML IJ SOLN
100.0000 mg | Freq: Every day | INTRAMUSCULAR | Status: DC
  Administered 2014-08-03 – 2014-08-10 (×6): 100 mg via INTRAVENOUS
  Filled 2014-08-03 (×7): qty 2

## 2014-08-03 MED ORDER — LACTULOSE 10 GM/15ML PO SOLN
30.0000 g | Freq: Three times a day (TID) | ORAL | Status: DC
  Filled 2014-08-03 (×4): qty 45

## 2014-08-03 MED ORDER — LORAZEPAM 2 MG/ML IJ SOLN
2.0000 mg | Freq: Once | INTRAMUSCULAR | Status: AC
  Administered 2014-08-03: 2 mg via INTRAVENOUS

## 2014-08-03 MED ORDER — LORAZEPAM 2 MG/ML IJ SOLN
2.0000 mg | INTRAMUSCULAR | Status: DC | PRN
  Administered 2014-08-03 – 2014-08-04 (×2): 2 mg via INTRAVENOUS
  Filled 2014-08-03 (×4): qty 1

## 2014-08-03 MED ORDER — LORAZEPAM 2 MG/ML IJ SOLN: 0.0000 mg | Freq: Two times a day (BID) | INTRAMUSCULAR | Status: DC

## 2014-08-03 MED ORDER — DEXTROSE 5 % IV SOLN
1.0000 g | INTRAVENOUS | Status: DC
  Administered 2014-08-03 – 2014-08-04 (×2): 1 g via INTRAVENOUS
  Administered 2014-08-05: 07:00:00 via INTRAVENOUS
  Administered 2014-08-06 – 2014-08-08 (×3): 1 g via INTRAVENOUS
  Filled 2014-08-03 (×7): qty 10

## 2014-08-03 MED ORDER — HYDRALAZINE HCL 20 MG/ML IJ SOLN
5.0000 mg | INTRAMUSCULAR | Status: DC | PRN
  Administered 2014-08-03 – 2014-08-04 (×5): 5 mg via INTRAVENOUS
  Filled 2014-08-03 (×5): qty 1

## 2014-08-03 MED ORDER — LIP MEDEX EX OINT
TOPICAL_OINTMENT | CUTANEOUS | Status: AC
  Administered 2014-08-03: 21:00:00
  Filled 2014-08-03: qty 7

## 2014-08-03 MED ORDER — PANTOPRAZOLE SODIUM 40 MG IV SOLR: 40.0000 mg | Freq: Two times a day (BID) | INTRAVENOUS | Status: DC

## 2014-08-03 MED ORDER — FOLIC ACID 1 MG PO TABS
1.0000 mg | ORAL_TABLET | Freq: Every day | ORAL | Status: DC
  Administered 2014-08-04 – 2014-08-16 (×8): 1 mg via ORAL
  Filled 2014-08-03 (×10): qty 1

## 2014-08-03 MED ORDER — ATORVASTATIN CALCIUM 40 MG PO TABS
40.0000 mg | ORAL_TABLET | Freq: Every morning | ORAL | Status: DC
  Administered 2014-08-04 – 2014-08-08 (×4): 40 mg via ORAL
  Filled 2014-08-03 (×6): qty 1

## 2014-08-03 MED ORDER — SODIUM CHLORIDE 0.9 % IV SOLN
8.0000 mg/h | INTRAVENOUS | Status: AC
  Administered 2014-08-03 – 2014-08-06 (×6): 8 mg/h via INTRAVENOUS
  Filled 2014-08-03 (×14): qty 80

## 2014-08-03 MED ORDER — SODIUM CHLORIDE 0.9 % IV SOLN
50.0000 ug/h | INTRAVENOUS | Status: DC
  Administered 2014-08-03 – 2014-08-06 (×7): 50 ug/h via INTRAVENOUS
  Filled 2014-08-03 (×17): qty 1

## 2014-08-03 MED ORDER — IPRATROPIUM BROMIDE 0.06 % NA SOLN: 2.0000 | Freq: Four times a day (QID) | NASAL | Status: DC | PRN

## 2014-08-03 MED ORDER — SODIUM CHLORIDE 0.9 % IV SOLN
80.0000 mg | Freq: Once | INTRAVENOUS | Status: AC
  Administered 2014-08-03: 80 mg via INTRAVENOUS
  Filled 2014-08-03: qty 80

## 2014-08-03 MED ORDER — LORAZEPAM 2 MG/ML IJ SOLN: 1.0000 mg | Freq: Four times a day (QID) | INTRAMUSCULAR | Status: DC | PRN

## 2014-08-03 MED ORDER — SODIUM CHLORIDE 0.9 % IV SOLN
Freq: Once | INTRAVENOUS | Status: AC
  Administered 2014-08-03: 05:00:00 via INTRAVENOUS

## 2014-08-03 MED ORDER — SODIUM CHLORIDE 0.9 % IV SOLN
INTRAVENOUS | Status: DC
  Administered 2014-08-03: 500 mL via INTRAVENOUS

## 2014-08-03 MED ORDER — MIDAZOLAM HCL 5 MG/ML IJ SOLN
INTRAMUSCULAR | Status: AC
  Filled 2014-08-03: qty 2

## 2014-08-03 MED ORDER — EPINEPHRINE 0.3 MG/0.3ML IJ SOAJ: 0.3000 mg | INTRAMUSCULAR | Status: DC | PRN

## 2014-08-03 MED ORDER — IOHEXOL 300 MG/ML  SOLN: 50.0000 mL | Freq: Once | INTRAMUSCULAR | Status: AC | PRN

## 2014-08-03 MED ORDER — IPRATROPIUM-ALBUTEROL 0.5-2.5 (3) MG/3ML IN SOLN: 3.0000 mL | RESPIRATORY_TRACT | Status: DC | PRN

## 2014-08-03 MED ORDER — FLUTICASONE PROPIONATE 50 MCG/ACT NA SUSP
1.0000 | Freq: Two times a day (BID) | NASAL | Status: DC | PRN
Start: 2014-08-03 — End: 2014-08-16
  Administered 2014-08-13: 1 via NASAL
  Filled 2014-08-03: qty 16

## 2014-08-03 MED ORDER — IOHEXOL 300 MG/ML  SOLN
100.0000 mL | Freq: Once | INTRAMUSCULAR | Status: AC | PRN
  Administered 2014-08-03: 100 mL via INTRAVENOUS

## 2014-08-03 MED ORDER — ADULT MULTIVITAMIN W/MINERALS CH
1.0000 | ORAL_TABLET | Freq: Every day | ORAL | Status: DC
  Administered 2014-08-04 – 2014-08-16 (×8): 1 via ORAL
  Filled 2014-08-03 (×10): qty 1

## 2014-08-03 MED ORDER — LORAZEPAM 1 MG PO TABS: 1.0000 mg | ORAL_TABLET | Freq: Four times a day (QID) | ORAL | Status: DC | PRN

## 2014-08-03 MED ORDER — LORAZEPAM 2 MG/ML IJ SOLN
0.0000 mg | Freq: Four times a day (QID) | INTRAMUSCULAR | Status: DC
  Administered 2014-08-03 (×2): 2 mg via INTRAVENOUS
  Filled 2014-08-03 (×3): qty 1

## 2014-08-03 MED ORDER — PANTOPRAZOLE SODIUM 40 MG IV SOLR
40.0000 mg | Freq: Two times a day (BID) | INTRAVENOUS | Status: DC
  Administered 2014-08-07 – 2014-08-13 (×12): 40 mg via INTRAVENOUS
  Filled 2014-08-03 (×14): qty 40

## 2014-08-03 MED ORDER — VITAMIN B-1 100 MG PO TABS
100.0000 mg | ORAL_TABLET | Freq: Every day | ORAL | Status: DC
  Administered 2014-08-05 – 2014-08-16 (×7): 100 mg via ORAL
  Filled 2014-08-03 (×7): qty 1

## 2014-08-03 MED ORDER — SODIUM CHLORIDE 0.9 % IJ SOLN
3.0000 mL | Freq: Two times a day (BID) | INTRAMUSCULAR | Status: DC
  Administered 2014-08-03 – 2014-08-15 (×20): 3 mL via INTRAVENOUS

## 2014-08-03 MED ORDER — SODIUM CHLORIDE 0.9 % IV SOLN
Freq: Once | INTRAVENOUS | Status: AC
  Administered 2014-08-03: 18:00:00 via INTRAVENOUS

## 2014-08-03 MED ORDER — FENTANYL CITRATE (PF) 100 MCG/2ML IJ SOLN
INTRAMUSCULAR | Status: AC
  Filled 2014-08-03: qty 2

## 2014-08-03 NOTE — ED Provider Notes (Signed)
CSN: [id number]     Arrival date & time 08/03/14  0309 History   First MD Initiated Contact with Patient 08/03/14 0356     Chief Complaint  Patient presents with  . Rectal Bleeding     (Consider location/radiation/quality/duration/timing/severity/associated sxs/prior Treatment) HPI 74 year old male presents to emergency department via EMS from home with complaint of lower GI bleed.  Patient is confused, and is a difficult historian.  Daughter is at the bedside, is health care power of attorney.  She reports that they went to the doctor on Friday for refill of blood pressure medication, and the treating physician was concerned for abdominal distention.  They were to have an ultrasound, but were unable to do it until today.  Daughter was away this weekend, called her father to remind him of the appointment for the ultrasound.  She was not able to get in touch with them later in the day became concerned.  She reports that she came home at 2 AM and found the front door open and unlocked and him lying on the floor.  Patient has dried blood in his shorts.  Patient reports that he had an episode of rectal bleeding at 2 PM that was dark in color, and another episode that ended around 5 PM.  Patient is confused, reports that he needs to get the ultrasound done today and that he has to have blood work done yesterday.  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  Daughter reports that hemoglobin usually is 13, but at the doctor's office on Friday, had dropped to 9.  At that time, patient does not have any reported GI bleeding.  Patient has remote history of a bleeding ulcer.  Patient has history of heavy alcohol use.  No reported history of esophageal varices or ongoing liver disease. Past Medical History  Diagnosis Date  . HTN (hypertension)   . Hypercholesterolemia    Past Surgical History  Procedure Laterality Date  . Tracheostomy  05/22/12   emergent, angioedema  . Tracheostomy tube placement N/A 05/31/2012    Procedure: TRACHEOSTOMY;  Surgeon: Flo Shanks, MD;  Location: [facility] OR;  Service: ENT;  Laterality: N/A;  . Laryngoscopy N/A 05/31/2012    Procedure: LARYNGOSCOPY;  Surgeon: Flo Shanks, MD;  Location: [facility] OR;  Service: ENT;  Laterality: N/A;  . Laryngoscopy N/A 06/19/2012    Procedure: DIRECT LARYNGOSCOPY AND BRONCHOSCOPY;  Surgeon: [name], MD;  Location: [facility] OR;  Service: ENT;  Laterality: N/A;  . Tracheostomy revision N/A 06/19/2012    Procedure: TRACHEOSTOMY DECANNULATION;  Surgeon: [name], MD;  Location: [facility] OR;  Service: ENT;  Laterality: N/A;  MICROSCOPE/TELESCOPE TRACHEOSTOMY REVISION   History reviewed. No pertinent family history. History  Substance Use Topics  . Smoking status: Never Smoker   . Smokeless tobacco: Not on file  . Alcohol Use: 7.0 oz/week    14 drink(s) per week     Comment: drinks daily    Review of Systems Level V caveat, patient confused   Allergies  Lisinopril and Amlodipine  Home Medications   Prior to Admission medications   Medication Sig Start Date End Date Taking? Authorizing Provider  albuterol (PROVENTIL HFA;VENTOLIN HFA) 108 (90 BASE) MCG/ACT inhaler Inhale 2 puffs into the lungs every 6 (six) hours as needed for wheezing. 06/20/12  Yes Russella Dar, NP  atorvastatin (LIPITOR) 40 MG tablet Take 40 mg by mouth every morning.   Yes Historical Provider, MD  carvedilol (COREG) 25  MG tablet TAKE 1 TABLET BY MOUTH TWICE A DAY 03/30/14  Yes Vesta Mixer, MD  EPINEPHrine (EPI-PEN) 0.3 mg/0.3 mL DEVI Inject 0.3 mg into the muscle as needed (for allergic reaction).  06/03/12  Yes Leslye Peer, MD  fluticasone (FLONASE) 50 MCG/ACT nasal spray Place 1 spray into both nostrils 2 (two) times daily as needed. For nasal congestion. 07/21/14  Yes Historical Provider, MD  furosemide (LASIX) 20 MG tablet Take 20 mg by mouth daily. 07/27/14  Yes Historical Provider, MD  ipratropium  (ATROVENT) 0.06 % nasal spray Place 2 sprays into the nose 4 (four) times daily as needed (for nasal congestion).    Yes Historical Provider, MD  mometasone (NASONEX) 50 MCG/ACT nasal spray Place 2 sprays into the nose daily as needed (nasal congestion).    Yes Historical Provider, MD  spironolactone (ALDACTONE) 25 MG tablet Take 25 mg by mouth daily. 07/27/14  Yes Historical Provider, MD  thiamine 100 MG tablet Take 1 tablet (100 mg total) by mouth daily. Patient not taking: Reported on 08/03/2014 05/31/12   Coralyn Helling, MD   BP 143/69 mmHg  Pulse 81  Temp(Src) 97.5 F (36.4 C) (Oral)  Resp 21  SpO2 97% Physical Exam  Constitutional: He is oriented to person, place, and time. He appears well-developed and well-nourished. No distress.  HENT:  Head: Normocephalic and atraumatic.  Nose: Nose normal.  Mouth/Throat: Oropharynx is clear and moist.  Eyes: Conjunctivae and EOM are normal. Pupils are equal, round, and reactive to light.  Patient's conjunctiva are pale  Neck: Normal range of motion. Neck supple. No JVD present. No tracheal deviation present. No thyromegaly present.  Cardiovascular: Normal rate, regular rhythm, normal heart sounds and intact distal pulses.  Exam reveals no gallop and no friction rub.   No murmur heard. Pulmonary/Chest: Effort normal and breath sounds normal. No stridor. No respiratory distress. He has no wheezes. He has no rales. He exhibits no tenderness.  Abdominal: Soft. He exhibits no distension and no mass. There is no tenderness. There is no rebound and no guarding.  Patient's abdomen is distended but not tympanic.  There is no tenderness to palpation.  He has hypoactive bowel sounds.  Genitourinary: Guaiac positive stool.  Musculoskeletal: Normal range of motion. He exhibits no edema or tenderness.  Lymphadenopathy:    He has no cervical adenopathy.  Neurological: He is alert and oriented to person, place, and time. He displays normal reflexes. He exhibits  normal muscle tone. Coordination normal.  Patient is somnolent but arousable.  Patient trails off while answering questions.  He is confused as to day, month and location  Skin: Skin is warm and dry. No rash noted. No erythema. No pallor.  Psychiatric: He has a normal mood and affect. His behavior is normal. Judgment and thought content normal.    ED Course  Procedures (including critical care time) Labs Review Labs Reviewed  CBC - Abnormal; Notable for the following:    RBC 2.10 (*)    Hemoglobin 7.2 (*)    HCT 21.7 (*)    MCV 103.3 (*)    MCH 34.3 (*)    RDW 16.5 (*)    All other components within normal limits  BASIC METABOLIC PANEL - Abnormal; Notable for the following:    Glucose, Bld 129 (*)    BUN 21 (*)    Creatinine, Ser 0.56 (*)    All other components within normal limits  PROTIME-INR - Abnormal; Notable for the following:  Prothrombin Time 17.4 (*)    All other components within normal limits  CBG MONITORING, ED - Abnormal; Notable for the following:    Glucose-Capillary 117 (*)    All other components within normal limits  I-STAT CHEM 8, ED - Abnormal; Notable for the following:    BUN 24 (*)    Creatinine, Ser 0.50 (*)    Glucose, Bld 127 (*)    Hemoglobin 8.2 (*)    HCT 24.0 (*)    All other components within normal limits  POC OCCULT BLOOD, ED - Abnormal; Notable for the following:    Fecal Occult Bld POSITIVE (*)    All other components within normal limits  ETHANOL  AMMONIA  I-STAT CG4 LACTIC ACID, ED  TYPE AND SCREEN  PREPARE RBC (CROSSMATCH)  ABO/RH    Imaging Review No results found.   EKG Interpretation   Date/Time:  Tuesday August 03 2014 03:25:11 EDT Ventricular Rate:  79 PR Interval:  205 QRS Duration: 106 QT Interval:  425 QTC Calculation: 487 R Axis:   -34 Text Interpretation:  Sinus rhythm Left axis deviation Abnormal R-wave  progression, early transition Borderline prolonged QT interval Confirmed  by Tierre Gerard  MD, Consepcion Utt ([id number]) on  08/03/2014 4:25:49 AM     CRITICAL CARE Performed by: Olivia Mackie Total critical care time: 30 min Critical care time was exclusive of separately billable procedures and treating other patients. Critical care was necessary to treat or prevent imminent or life-threatening deterioration. Critical care was time spent personally by me on the following activities: development of treatment plan with patient and/or surrogate as well as nursing, discussions with consultants, evaluation of patient's response to treatment, examination of patient, obtaining history from patient or surrogate, ordering and performing treatments and interventions, ordering and review of laboratory studies, ordering and review of radiographic studies, pulse oximetry and re-evaluation of patient's condition.  MDM   Final diagnoses:  Acute blood loss anemia  Lower GI bleed    74 year old male with lower GI bleeding.  Concern for diverticular bleed, other differential would be bleeding ulcer that is bleeding more briskly.  Labs have been ordered.  Hemoglobin is noted to be 7.2, which is 2 points lower than reported on this past Friday.  Blood transfusion has been ordered.  Patient will need admission to the hospital and upper and lower endoscopy for further evaluation.  At this time, he is hemodynamically stable.    Marisa Severin, MD 08/03/14 [phone]

## 2014-08-03 NOTE — ED Notes (Signed)
Pt's daughter states she called her father at 2330 hours last night and he answered the phone but sounded tired.  Pt's daughter called this am and continued to call all day but got no response form her father.  Pt has seen PCP who was states that the patient was anemic and needed a scan today but apparently he didn't go today.

## 2014-08-03 NOTE — ED Notes (Signed)
Bed: OH60 Expected date: 08/03/14 Expected time: 2:55 AM Means of arrival: Ambulance Comments: Rectal bleed

## 2014-08-03 NOTE — Progress Notes (Signed)
Date:  August 03, 2014 U.R. performed for needs and level of care. Will continue to follow for Case Management needs.  Mehran Guderian, RN, BSN, CCM   [phone] 

## 2014-08-03 NOTE — H&P (View-Only) (Signed)
Referring Provider:[facility] Primary Care Physician:  Wenda Low, MD Primary Gastroenterologist:  unassigned  Reason for Consultation:  GI bleed     HPI: Keith [name] is a 74 y.o. male who was admitted through the emergency room early this morning. Patient has been given 2 mg IV Ativan in the ER and is very sleepy. His daughter who is his power of attorney is with him and provides much of that history. Patient is followed by Belinda Block at [place] [facility] [employer] in [facility]. He has a history of an Emergent admission for angioedema due to ACE inhibitor's in 2014 at [facility]. He had a tracheostomy at that time. He also had atrial fibrillation but after discharge had a 30 day monitor and did not have any A. fib on the monitor. He was felt his A. fib was associated with respiratory arrest due to complications with his tracheostomy site. Patient's daughter also reports that in 1990 patient had an admission to [facility] and was told that he had ulcers. Daughter reports patient had complained of intermittent abdominal pain since January but had not had a workup anywhere.  Patient's daughter recently moved to [place] and lives with the patient to care for him. Over the past year they have established care with a primary care provider in [facility] as stated as above. About 6 months ago the daughter states the patient was found to have abnormal LFTs. He was advised to stop drinking. Patient apparently has drank heavily for many years he typically drinks a pint to a pint and a half of vodka daily. Last week he was feeling tired and was evaluated by his PCP. His hemoglobin was found to be 9 down from his baseline of 13. He was scheduled for an ultrasound in [facility] but was unable to have it done before yesterday. His daughter went away for the weekend and states that Sunday she tried calling her father multiple times and he did not answer the phone. She got home to [place]  around 2 AM this morning and found the patient's front tore opened with the screening for locked the entire house was dark. She pounded  on the door for several minutes and finally her father stumbled to the door. She states he was very confused and it took him about 5 minutes to figure out how to open the door. As the father turned around she noted that he had blood all over his undergarments and that there was blood on the floor. She brought the patient to the [facility] long emergency room. She states the patient is somewhat confused but reported that he started having dark bowel movements Sunday night and throughout Monday. She thinks he passed out and laid on the floor for a while. In the emergency room he was found to have a hemoglobin of 7.2 with Hemoccult-positive stools. He was given 1 unit of packed cells in the emergency room and a second unit is to be given. 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   Past Medical History  Diagnosis Date  . HTN (hypertension)   . Hypercholesterolemia   . Alcohol abuse   . A-fib   . History of GI bleed     bleeding from ulcer  . History of cardiac arrest   . Chronic diastolic CHF (congestive heart failure)  Past Surgical History  Procedure Laterality Date  . Tracheostomy  05/22/12    emergent, angioedema  . Tracheostomy tube placement N/A 05/31/2012    Procedure: TRACHEOSTOMY;  Surgeon: Jodi Marble, MD;  Location: [place];  Service: ENT;  Laterality: N/A;  . Laryngoscopy N/A 05/31/2012    Procedure: LARYNGOSCOPY;  Surgeon: Jodi Marble, MD;  Location: [place];  Service: ENT;  Laterality: N/A;  . Laryngoscopy N/A 06/19/2012    Procedure: DIRECT LARYNGOSCOPY AND BRONCHOSCOPY;  Surgeon: Ruby Cola, MD;  Location: [place];  Service: ENT;  Laterality: N/A;  . Tracheostomy revision N/A 06/19/2012    Procedure:  TRACHEOSTOMY DECANNULATION;  Surgeon: Ruby Cola, MD;  Location: [place];  Service: ENT;  Laterality: N/A;  [place]    Prior to Admission medications   Medication Sig Start Date End Date Taking? Authorizing Provider  albuterol (PROVENTIL HFA;VENTOLIN HFA) 108 (90 BASE) MCG/ACT inhaler Inhale 2 puffs into the lungs every 6 (six) hours as needed for wheezing. 06/20/12  Yes Samella Parr, NP  atorvastatin (LIPITOR) 40 MG tablet Take 40 mg by mouth every morning.   Yes Historical Provider, MD  carvedilol (COREG) 25 MG tablet TAKE 1 TABLET BY MOUTH TWICE A DAY 03/30/14  Yes Thayer Headings, MD  EPINEPHrine (EPI-PEN) 0.3 mg/0.3 mL DEVI Inject 0.3 mg into the muscle as needed (for allergic reaction).  06/03/12  Yes Collene Gobble, MD  fluticasone (FLONASE) 50 MCG/ACT nasal spray Place 1 spray into both nostrils 2 (two) times daily as needed. For nasal congestion. 07/21/14  Yes Historical Provider, MD  furosemide (LASIX) 20 MG tablet Take 20 mg by mouth daily. 07/27/14  Yes Historical Provider, MD  ipratropium (ATROVENT) 0.06 % nasal spray Place 2 sprays into the nose 4 (four) times daily as needed (for nasal congestion).    Yes Historical Provider, MD  mometasone (NASONEX) 50 MCG/ACT nasal spray Place 2 sprays into the nose daily as needed (nasal congestion).    Yes Historical Provider, MD  spironolactone (ALDACTONE) 25 MG tablet Take 25 mg by mouth daily. 07/27/14  Yes Historical Provider, MD  thiamine 100 MG tablet Take 1 tablet (100 mg total) by mouth daily. Patient not taking: Reported on 08/03/2014 05/31/12   Chesley Mires, MD    Current Facility-Administered Medications  Medication Dose Route Frequency Provider Last Rate Last Dose  . [START ON 08/04/2014] atorvastatin (LIPITOR) tablet 40 mg  40 mg Oral q morning - 10a Ivor Costa, MD      . cefTRIAXone (ROCEPHIN) 1 g in dextrose 5 % 50 mL IVPB  1 g Intravenous Q24H Ivor Costa, MD      . EPINEPHrine (EPI-PEN) injection 0.3 mg   0.3 mg Intramuscular PRN Ivor Costa, MD      . fluticasone (FLONASE) 50 MCG/ACT nasal spray 1 spray  1 spray Each Nare BID PRN Ivor Costa, MD      . folic acid (FOLVITE) tablet 1 mg  1 mg Oral Daily Ivor Costa, MD      . hydrALAZINE (APRESOLINE) injection 5 mg  5 mg Intravenous Q2H PRN Ivor Costa, MD      . iohexol (OMNIPAQUE) 300 MG/ML solution 50 mL  50 mL Oral Once PRN Medication Radiologist, MD      . ipratropium (ATROVENT) 0.06 % nasal spray 2 spray  2 spray Nasal QID PRN Ivor Costa, MD      . ipratropium-albuterol (DUONEB) 0.5-2.5 (3) MG/3ML nebulizer solution 3 mL  3 mL Nebulization Q4H PRN  Ivor Costa, MD      . lactulose (CHRONULAC) 10 GM/15ML solution 30 g  30 g Oral TID Albertine Patricia, MD      . LORazepam (ATIVAN) injection 0-4 mg  0-4 mg Intravenous Q6H Ivor Costa, MD   2 mg at 08/03/14 0708   Followed by  . [START ON 08/05/2014] LORazepam (ATIVAN) injection 0-4 mg  0-4 mg Intravenous Q12H Ivor Costa, MD      . LORazepam (ATIVAN) tablet 1 mg  1 mg Oral Q6H PRN Ivor Costa, MD       Or  . LORazepam (ATIVAN) injection 1 mg  1 mg Intravenous Q6H PRN Ivor Costa, MD      . multivitamin with minerals tablet 1 tablet  1 tablet Oral Daily Ivor Costa, MD      . octreotide (SANDOSTATIN) 500 mcg in sodium chloride 0.9 % 250 mL (2 mcg/mL) infusion  50 mcg/hr Intravenous Continuous Silver Huguenin Elgergawy, MD      . pantoprazole (PROTONIX) 80 mg in sodium chloride 0.9 % 100 mL IVPB  80 mg Intravenous Once Albertine Patricia, MD      . pantoprazole (PROTONIX) 80 mg in sodium chloride 0.9 % 250 mL (0.32 mg/mL) infusion  8 mg/hr Intravenous Continuous Albertine Patricia, MD      . Derrill Memo ON 08/06/2014] pantoprazole (PROTONIX) injection 40 mg  40 mg Intravenous Q12H Dawood S Elgergawy, MD      . sodium chloride 0.9 % injection 3 mL  3 mL Intravenous Q12H Ivor Costa, MD      . thiamine (VITAMIN B-1) tablet 100 mg  100 mg Oral Daily Ivor Costa, MD       Or  . thiamine (B-1) injection 100 mg  100 mg Intravenous Daily  Ivor Costa, MD        Allergies as of 08/03/2014 - Review Complete 08/03/2014  Allergen Reaction Noted  . Lisinopril Other (See Comments) 05/22/2012  . Amlodipine Swelling 07/24/2012    Family History  Problem Relation Age of Onset  . Breast cancer Mother   . Stroke Father     History   Social History  . Marital Status: Single    Spouse Name: N/A  . Number of Children: N/A  . Years of Education: N/A   Occupational History  . Not on file.   Social History Main Topics  . Smoking status: Never Smoker   . Smokeless tobacco: Not on file  . Alcohol Use: 7.0 oz/week    14 drink(s) per week     Comment: drinks daily  . Drug Use: No  . Sexual Activity: Not on file   Other Topics Concern  . Not on file   Social History Narrative    Review of Systems: Patient is very sleepy after having received IV Ativan and is unable to answer all of the review of systems questions.  Physical Exam: Vital signs in last 24 hours: Temp:  [97.5 F (36.4 C)-97.6 F (36.4 C)] 97.5 F (36.4 C) (06/14 0723) Pulse Rate:  [72-89] 89 (06/14 0800) Resp:  [16-27] 27 (06/14 0800) BP: (112-158)/(55-78) 131/62 mmHg (06/14 0723) SpO2:  [95 %-99 %] 99 % (06/14 0800) Weight:  [205 lb 0.4 oz (93 kg)] 205 lb 0.4 oz (93 kg) (06/14 0800)   General:  Somnolent Head:  Normocephalic and atraumatic. Eyes:  Sclera clear, no icterus.   Conjunctiva pink. Ears:  Normal auditory acuity. Nose:  No deformity, discharge,  or lesions. Mouth:  No deformity  or lesions.   Neck:  Supple; no masses or thyromegaly. No JVD Lungs:  Clear throughout to auscultation.   No wheezes, crackles, or rhonchi.  Heart:  Regular rate and rhythm; no murmurs, clicks, rubs,  or gallops. Abdomen:  Soft,nontender, distended BS active,nonpalp mass or hsm.   Rectal:  Deferred , ER exam with heme-positive stools Msk:  Symmetrical without gross deformities. . Pulses:  Normal pulses noted. Extremities: 1+ lower extremity edema  bilaterally Neurologic somnolent and not oriented to time and place, able to recognize daughter Skin: No rashes Psych: Unable to assess  Intake/Output from previous day: 06/13 0701 - 06/14 0700 In: 1350 [I.V.:1000; Blood:350] Out: -  Intake/Output this shift:    Lab Results:  Recent Labs  08/03/14 0339 08/03/14 0351  WBC 8.9  --   HGB 7.2* 8.2*  HCT 21.7* 24.0*  PLT 262  --    BMET  Recent Labs  08/03/14 0339 08/03/14 0351  NA 140 143  K 4.1 4.1  CL 107 106  CO2 24  --   GLUCOSE 129* 127*  BUN 21* 24*  CREATININE 0.56* 0.50*  CALCIUM 9.0  --    Ammonia 122  PT/INR  Recent Labs  08/03/14 0404  LABPROT 17.4*  INR 1.42   Comprehensive metabolic panel 16/11/9602 which is the last on record in this EMR revealed AST 58, ALT 21, alkaline phosphatase 80, total bili 0.9.    IMPRESSION/PLAN: 74 year old man with a history of alcohol abuse admitted with GI bleed etiology of which is currently unclear. Patient did report to his daughter that he was having dark stools. Patient has a prior history of ulcers per her daughter in differential includes gastric or duodenal ulcers, esophagitis, varices, gastritis. Patient has been admitted to SDU.Marland Kitchen Has received 1 unit of blood thus far and second unit is pending. Continue IV pantoprazole and octreotide. Monitor H&H. Avoid heparin/Lovenox etc. at this point. We will obtain a hepatic panel today along with hepatitis panel to determine if patient will need immunization on follow-up.  Confusion/altered mental status. Patient has been noted to have an ammonia of 122 possibly due to hepatic encephalopathy. Will start lactulose. Patient has been started on Rocephin to cover SBP.  Alcohol abuse. Patient will need counseling to help discontinue alcohol intake. CIWA protocol.  Chronic diastolic CHF. Last echo was in May 2015 and showed ejection fraction 60-65%. Patient was on Lasix 20 mg and spironolactone 25 mg at home. Lasix and  spironolactone are currently on hold due to GI B and AMS.  History of intermittent abdominal pain. Patient unable to answer questions at this time. Daughter reports complaints of vague abdominal pain intermittently since January. Daughter reports patient has never seen a gastroenterologist and had a colonoscopy in the past. She did report a prior history of ulcers in 1990.  Will review with Dr. Ardis Hughs as to further recommendations. Hvozdovic, Deloris Ping 08/03/2014,  Pager [phone]   ________________________________________________________________________  Velora Heckler GI MD note:  I personally examined the patient, reviewed the data and agree with the assessment and plan described above.  Met with his daughter. He has tried to stop drinking in past but never successful. She is not too hopeful that he will be able to in the future.  Planning on EGD today.     Owens Loffler, MD [facility] Pager [phone]

## 2014-08-03 NOTE — Progress Notes (Signed)
PT Cancellation Note  Patient Details Name: Keith [name] MRN: [id number] DOB: [birth year]   Cancelled Treatment:    Reason Eval/Treat Not Completed: Other (comment) (order to start 6/15)   [facility] 08/03/2014, 9:01 AM

## 2014-08-03 NOTE — Consult Note (Signed)
Referring Provider:[facility] Primary Care Physician:  Wenda Low, MD Primary Gastroenterologist:  unassigned  Reason for Consultation:  GI bleed     HPI: Keith [name] is a 74 y.o. male who was admitted through the emergency room early this morning. Patient has been given 2 mg IV Ativan in the ER and is very sleepy. His daughter who is his power of attorney is with him and provides much of that history. Patient is followed by Belinda Block at [place] [facility] [employer] in [facility]. He has a history of an Emergent admission for angioedema due to ACE inhibitor's in 2014 at [facility]. He had a tracheostomy at that time. He also had atrial fibrillation but after discharge had a 30 day monitor and did not have any A. fib on the monitor. He was felt his A. fib was associated with respiratory arrest due to complications with his tracheostomy site. Patient's daughter also reports that in 1990 patient had an admission to [facility] and was told that he had ulcers. Daughter reports patient had complained of intermittent abdominal pain since January but had not had a workup anywhere.  Patient's daughter recently moved to [place] and lives with the patient to care for him. Over the past year they have established care with a primary care provider in [facility] as stated as above. About 6 months ago the daughter states the patient was found to have abnormal LFTs. He was advised to stop drinking. Patient apparently has drank heavily for many years he typically drinks a pint to a pint and a half of vodka daily. Last week he was feeling tired and was evaluated by his PCP. His hemoglobin was found to be 9 down from his baseline of 13. He was scheduled for an ultrasound in [facility] but was unable to have it done before yesterday. His daughter went away for the weekend and states that Sunday she tried calling her father multiple times and he did not answer the phone. She got home to [place]  around 2 AM this morning and found the patient's front tore opened with the screening for locked the entire house was dark. She pounded  on the door for several minutes and finally her father stumbled to the door. She states he was very confused and it took him about 5 minutes to figure out how to open the door. As the father turned around she noted that he had blood all over his undergarments and that there was blood on the floor. She brought the patient to the [facility] long emergency room. She states the patient is somewhat confused but reported that he started having dark bowel movements Sunday night and throughout Monday. She thinks he passed out and laid on the floor for a while. In the emergency room he was found to have a hemoglobin of 7.2 with Hemoccult-positive stools. He was given 1 unit of packed cells in the emergency room and a second unit is to be given. 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   Past Medical History  Diagnosis Date  . HTN (hypertension)   . Hypercholesterolemia   . Alcohol abuse   . A-fib   . History of GI bleed     bleeding from ulcer  . History of cardiac arrest   . Chronic diastolic CHF (congestive heart failure)  Past Surgical History  Procedure Laterality Date  . Tracheostomy  05/22/12    emergent, angioedema  . Tracheostomy tube placement N/A 05/31/2012    Procedure: TRACHEOSTOMY;  Surgeon: Jodi Marble, MD;  Location: [place];  Service: ENT;  Laterality: N/A;  . Laryngoscopy N/A 05/31/2012    Procedure: LARYNGOSCOPY;  Surgeon: Jodi Marble, MD;  Location: [place];  Service: ENT;  Laterality: N/A;  . Laryngoscopy N/A 06/19/2012    Procedure: DIRECT LARYNGOSCOPY AND BRONCHOSCOPY;  Surgeon: Ruby Cola, MD;  Location: [place];  Service: ENT;  Laterality: N/A;  . Tracheostomy revision N/A 06/19/2012    Procedure:  TRACHEOSTOMY DECANNULATION;  Surgeon: Ruby Cola, MD;  Location: [place];  Service: ENT;  Laterality: N/A;  [place]    Prior to Admission medications   Medication Sig Start Date End Date Taking? Authorizing Provider  albuterol (PROVENTIL HFA;VENTOLIN HFA) 108 (90 BASE) MCG/ACT inhaler Inhale 2 puffs into the lungs every 6 (six) hours as needed for wheezing. 06/20/12  Yes Samella Parr, NP  atorvastatin (LIPITOR) 40 MG tablet Take 40 mg by mouth every morning.   Yes Historical Provider, MD  carvedilol (COREG) 25 MG tablet TAKE 1 TABLET BY MOUTH TWICE A DAY 03/30/14  Yes Thayer Headings, MD  EPINEPHrine (EPI-PEN) 0.3 mg/0.3 mL DEVI Inject 0.3 mg into the muscle as needed (for allergic reaction).  06/03/12  Yes Collene Gobble, MD  fluticasone (FLONASE) 50 MCG/ACT nasal spray Place 1 spray into both nostrils 2 (two) times daily as needed. For nasal congestion. 07/21/14  Yes Historical Provider, MD  furosemide (LASIX) 20 MG tablet Take 20 mg by mouth daily. 07/27/14  Yes Historical Provider, MD  ipratropium (ATROVENT) 0.06 % nasal spray Place 2 sprays into the nose 4 (four) times daily as needed (for nasal congestion).    Yes Historical Provider, MD  mometasone (NASONEX) 50 MCG/ACT nasal spray Place 2 sprays into the nose daily as needed (nasal congestion).    Yes Historical Provider, MD  spironolactone (ALDACTONE) 25 MG tablet Take 25 mg by mouth daily. 07/27/14  Yes Historical Provider, MD  thiamine 100 MG tablet Take 1 tablet (100 mg total) by mouth daily. Patient not taking: Reported on 08/03/2014 05/31/12   Chesley Mires, MD    Current Facility-Administered Medications  Medication Dose Route Frequency Provider Last Rate Last Dose  . [START ON 08/04/2014] atorvastatin (LIPITOR) tablet 40 mg  40 mg Oral q morning - 10a Ivor Costa, MD      . cefTRIAXone (ROCEPHIN) 1 g in dextrose 5 % 50 mL IVPB  1 g Intravenous Q24H Ivor Costa, MD      . EPINEPHrine (EPI-PEN) injection 0.3 mg   0.3 mg Intramuscular PRN Ivor Costa, MD      . fluticasone (FLONASE) 50 MCG/ACT nasal spray 1 spray  1 spray Each Nare BID PRN Ivor Costa, MD      . folic acid (FOLVITE) tablet 1 mg  1 mg Oral Daily Ivor Costa, MD      . hydrALAZINE (APRESOLINE) injection 5 mg  5 mg Intravenous Q2H PRN Ivor Costa, MD      . iohexol (OMNIPAQUE) 300 MG/ML solution 50 mL  50 mL Oral Once PRN Medication Radiologist, MD      . ipratropium (ATROVENT) 0.06 % nasal spray 2 spray  2 spray Nasal QID PRN Ivor Costa, MD      . ipratropium-albuterol (DUONEB) 0.5-2.5 (3) MG/3ML nebulizer solution 3 mL  3 mL Nebulization Q4H PRN  Ivor Costa, MD      . lactulose (CHRONULAC) 10 GM/15ML solution 30 g  30 g Oral TID Albertine Patricia, MD      . LORazepam (ATIVAN) injection 0-4 mg  0-4 mg Intravenous Q6H Ivor Costa, MD   2 mg at 08/03/14 0708   Followed by  . [START ON 08/05/2014] LORazepam (ATIVAN) injection 0-4 mg  0-4 mg Intravenous Q12H Ivor Costa, MD      . LORazepam (ATIVAN) tablet 1 mg  1 mg Oral Q6H PRN Ivor Costa, MD       Or  . LORazepam (ATIVAN) injection 1 mg  1 mg Intravenous Q6H PRN Ivor Costa, MD      . multivitamin with minerals tablet 1 tablet  1 tablet Oral Daily Ivor Costa, MD      . octreotide (SANDOSTATIN) 500 mcg in sodium chloride 0.9 % 250 mL (2 mcg/mL) infusion  50 mcg/hr Intravenous Continuous Silver Huguenin Elgergawy, MD      . pantoprazole (PROTONIX) 80 mg in sodium chloride 0.9 % 100 mL IVPB  80 mg Intravenous Once Albertine Patricia, MD      . pantoprazole (PROTONIX) 80 mg in sodium chloride 0.9 % 250 mL (0.32 mg/mL) infusion  8 mg/hr Intravenous Continuous Albertine Patricia, MD      . Derrill Memo ON 08/06/2014] pantoprazole (PROTONIX) injection 40 mg  40 mg Intravenous Q12H Dawood S Elgergawy, MD      . sodium chloride 0.9 % injection 3 mL  3 mL Intravenous Q12H Ivor Costa, MD      . thiamine (VITAMIN B-1) tablet 100 mg  100 mg Oral Daily Ivor Costa, MD       Or  . thiamine (B-1) injection 100 mg  100 mg Intravenous Daily  Ivor Costa, MD        Allergies as of 08/03/2014 - Review Complete 08/03/2014  Allergen Reaction Noted  . Lisinopril Other (See Comments) 05/22/2012  . Amlodipine Swelling 07/24/2012    Family History  Problem Relation Age of Onset  . Breast cancer Mother   . Stroke Father     History   Social History  . Marital Status: Single    Spouse Name: N/A  . Number of Children: N/A  . Years of Education: N/A   Occupational History  . Not on file.   Social History Main Topics  . Smoking status: Never Smoker   . Smokeless tobacco: Not on file  . Alcohol Use: 7.0 oz/week    14 drink(s) per week     Comment: drinks daily  . Drug Use: No  . Sexual Activity: Not on file   Other Topics Concern  . Not on file   Social History Narrative    Review of Systems: Patient is very sleepy after having received IV Ativan and is unable to answer all of the review of systems questions.  Physical Exam: Vital signs in last 24 hours: Temp:  [97.5 F (36.4 C)-97.6 F (36.4 C)] 97.5 F (36.4 C) (06/14 0723) Pulse Rate:  [72-89] 89 (06/14 0800) Resp:  [16-27] 27 (06/14 0800) BP: (112-158)/(55-78) 131/62 mmHg (06/14 0723) SpO2:  [95 %-99 %] 99 % (06/14 0800) Weight:  [205 lb 0.4 oz (93 kg)] 205 lb 0.4 oz (93 kg) (06/14 0800)   General:  Somnolent Head:  Normocephalic and atraumatic. Eyes:  Sclera clear, no icterus.   Conjunctiva pink. Ears:  Normal auditory acuity. Nose:  No deformity, discharge,  or lesions. Mouth:  No deformity  or lesions.   Neck:  Supple; no masses or thyromegaly. No JVD Lungs:  Clear throughout to auscultation.   No wheezes, crackles, or rhonchi.  Heart:  Regular rate and rhythm; no murmurs, clicks, rubs,  or gallops. Abdomen:  Soft,nontender, distended BS active,nonpalp mass or hsm.   Rectal:  Deferred , ER exam with heme-positive stools Msk:  Symmetrical without gross deformities. . Pulses:  Normal pulses noted. Extremities: 1+ lower extremity edema  bilaterally Neurologic somnolent and not oriented to time and place, able to recognize daughter Skin: No rashes Psych: Unable to assess  Intake/Output from previous day: 06/13 0701 - 06/14 0700 In: 1350 [I.V.:1000; Blood:350] Out: -  Intake/Output this shift:    Lab Results:  Recent Labs  08/03/14 0339 08/03/14 0351  WBC 8.9  --   HGB 7.2* 8.2*  HCT 21.7* 24.0*  PLT 262  --    BMET  Recent Labs  08/03/14 0339 08/03/14 0351  NA 140 143  K 4.1 4.1  CL 107 106  CO2 24  --   GLUCOSE 129* 127*  BUN 21* 24*  CREATININE 0.56* 0.50*  CALCIUM 9.0  --    Ammonia 122  PT/INR  Recent Labs  08/03/14 0404  LABPROT 17.4*  INR 1.42   Comprehensive metabolic panel 16/11/9602 which is the last on record in this EMR revealed AST 58, ALT 21, alkaline phosphatase 80, total bili 0.9.    IMPRESSION/PLAN: 74 year old man with a history of alcohol abuse admitted with GI bleed etiology of which is currently unclear. Patient did report to his daughter that he was having dark stools. Patient has a prior history of ulcers per her daughter in differential includes gastric or duodenal ulcers, esophagitis, varices, gastritis. Patient has been admitted to SDU.Marland Kitchen Has received 1 unit of blood thus far and second unit is pending. Continue IV pantoprazole and octreotide. Monitor H&H. Avoid heparin/Lovenox etc. at this point. We will obtain a hepatic panel today along with hepatitis panel to determine if patient will need immunization on follow-up.  Confusion/altered mental status. Patient has been noted to have an ammonia of 122 possibly due to hepatic encephalopathy. Will start lactulose. Patient has been started on Rocephin to cover SBP.  Alcohol abuse. Patient will need counseling to help discontinue alcohol intake. CIWA protocol.  Chronic diastolic CHF. Last echo was in May 2015 and showed ejection fraction 60-65%. Patient was on Lasix 20 mg and spironolactone 25 mg at home. Lasix and  spironolactone are currently on hold due to GI B and AMS.  History of intermittent abdominal pain. Patient unable to answer questions at this time. Daughter reports complaints of vague abdominal pain intermittently since January. Daughter reports patient has never seen a gastroenterologist and had a colonoscopy in the past. She did report a prior history of ulcers in 1990.  Will review with Dr. Ardis Hughs as to further recommendations. Hvozdovic, Deloris Ping 08/03/2014,  Pager [phone]   ________________________________________________________________________  Velora Heckler GI MD note:  I personally examined the patient, reviewed the data and agree with the assessment and plan described above.  Met with his daughter. He has tried to stop drinking in past but never successful. She is not too hopeful that he will be able to in the future.  Planning on EGD today.     Owens Loffler, MD [facility]astro[facility] Pager [phone]

## 2014-08-03 NOTE — H&P (Addendum)
[facility] History and Physical  [name]:[id number] DOB: [birth year] DOA: 08/03/2014  Referring physician: ED physician PCP: Georgann Housekeeper, MD  Specialists:   Chief Complaint: Rectal bleeding, altered mental status and abdominal pain  HPI: Keith [name] is a 74 y.o. male with PMH of hypertension, hyperlipidemia, diastolic congestive heart failure, history of GI bleeding, alcohol abuse, who presents with rectal bleeding, altered mental status and abdominal pain.  Patient has altered mental status, history is obtained from his daughter. Per his daughter, pt has been having intermittent abdominal pain since January 2 016. His abdominal pain is located in the lower abdomen, mild to moderate. 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 Patient has dried blood in his shorts per his daughter. Patient reports that he had an episode of rectal bleeding at 2 PM that was dark in color, and another episode around 5 PM. His hemoglobin was 12.1 on 05/13/13, but at the doctor's office on Friday, had dropped to 9. At that time, patient did not have any reported GI bleeding.Per his daughter, patient had negative colonoscopy 6 years ago. Not sure whether patient had any EGD recently. When I saw patient in emergency room, patient is confused, reporting no diarrhea or abdominal pain.  In ED, patient was found to have hemoglobin drop from 9.0 on Friday to 7.2 today, lactate 1.7, and INR 1.42, temperature 97.2, electrolytes okay, FOBT positive, alcohol level less than 5. Patient is admitted to inpatient for further evaluation and treatment. GI  was consulted, will see in morning.  Where does patient live?   At home    Can patient participate in ADLs? Some   Review of Systems: Not obtained due to altered mental status.  Allergy:  Allergies  Allergen Reactions  . Lisinopril Other (See Comments)    Presented with laryngeal edema - likely caused is lisinopril   . Amlodipine Swelling    Past Medical History  Diagnosis Date  . HTN (hypertension)   . Hypercholesterolemia   . Alcohol abuse   . A-fib   . History of GI bleed     bleeding from ulcer  . History of cardiac arrest   . Chronic diastolic CHF (congestive heart failure)     Past Surgical History  Procedure Laterality Date  . Tracheostomy  05/22/12    emergent, angioedema  . Tracheostomy tube placement N/A 05/31/2012    Procedure: TRACHEOSTOMY;  Surgeon: Flo Shanks, MD;  Location: [facility] OR;  Service: ENT;  Laterality: N/A;  . Laryngoscopy N/A 05/31/2012    Procedure: LARYNGOSCOPY;  Surgeon: Flo Shanks, MD;  Location: [facility] OR;  Service: ENT;  Laterality: N/A;  . Laryngoscopy N/A 06/19/2012    Procedure: DIRECT LARYNGOSCOPY AND BRONCHOSCOPY;  Surgeon: [name], MD;  Location: [facility] OR;  Service: ENT;  Laterality: N/A;  . Tracheostomy revision N/A 06/19/2012    Procedure: TRACHEOSTOMY DECANNULATION;  Surgeon: [name], MD;  Location: [facility] OR;  Service: ENT;  Laterality: N/A;  MICROSCOPE/TELESCOPE TRACHEOSTOMY REVISION    Social History:  reports that he has never smoked. He does not have any smokeless tobacco history on file. He reports that he drinks about 7.0 oz of alcohol per week. He  reports that he does not use illicit drugs.  Family History:  Family History  Problem Relation Age of Onset  . Breast cancer Mother   . Stroke Father      Prior to Admission medications   Medication Sig Start Date End Date Taking? Authorizing Provider  albuterol (PROVENTIL HFA;VENTOLIN HFA) 108 (90 BASE) MCG/ACT inhaler Inhale 2 puffs into the lungs every 6 (six) hours as needed  for wheezing. 06/20/12  Yes Russella Dar, NP  atorvastatin (LIPITOR) 40 MG tablet Take 40 mg by mouth every morning.   Yes Historical Provider, MD  carvedilol (COREG) 25 MG tablet TAKE 1 TABLET BY MOUTH TWICE A DAY 03/30/14  Yes Vesta Mixer, MD  EPINEPHrine (EPI-PEN) 0.3 mg/0.3 mL DEVI Inject 0.3 mg into the muscle as needed (for allergic reaction).  06/03/12  Yes Leslye Peer, MD  fluticasone (FLONASE) 50 MCG/ACT nasal spray Place 1 spray into both nostrils 2 (two) times daily as needed. For nasal congestion. 07/21/14  Yes Historical Provider, MD  furosemide (LASIX) 20 MG tablet Take 20 mg by mouth daily. 07/27/14  Yes Historical Provider, MD  ipratropium (ATROVENT) 0.06 % nasal spray Place 2 sprays into the nose 4 (four) times daily as needed (for nasal congestion).    Yes Historical Provider, MD  mometasone (NASONEX) 50 MCG/ACT nasal spray Place 2 sprays into the nose daily as needed (nasal congestion).    Yes Historical Provider, MD  spironolactone (ALDACTONE) 25 MG tablet Take 25 mg by mouth daily. 07/27/14  Yes Historical Provider, MD  thiamine 100 MG tablet Take 1 tablet (100 mg total) by mouth daily. Patient not taking: Reported on 08/03/2014 05/31/12   Coralyn Helling, MD    Physical Exam: Filed Vitals:   08/03/14 0620 08/03/14 0640 08/03/14 0659 08/03/14 0723  BP: 142/60 132/58 132/58 131/62  Pulse: 76 76 76 76  Temp:    97.5 F (36.4 C)  TempSrc:    Oral  Resp: SpO2: 98% 97%  97%   General: Not in acute distress HEENT:       Eyes: PERRL, EOMI, no scleral icterus.       ENT: No discharge from the ears and nose, no pharynx injection, no tonsillar enlargement.        Neck: No JVD, no bruit, no mass felt. Heme: No neck lymph node enlargement. Cardiac: S1/S2, RRR, No murmurs, No gallops or rubs. Pulm:  No rales, wheezing, rhonchi or rubs. Abd: Soft, distended, nontender, no rebound pain, no organomegaly, BS present. Ext: 1+ pitting leg edema bilaterally. 2+DP/PT pulse  bilaterally. Musculoskeletal: No joint deformities, No joint redness or warmth, no limitation of ROM in spin. Skin: No rashes.  Neuro: somnolent, not oriented to time and place, able to recognize her daughter, cranial nerves II-XII grossly intact, moves all extremities. Brachial reflex 1+ bilaterally. Knee reflex 1+ bilaterally. Negative Babinski's sign.  Psych: unable to assess.  Labs on Admission:  Basic Metabolic Panel:  Recent Labs Lab 08/03/14 0339 08/03/14 0351  NA 140 143  K 4.1 4.1  CL 107 106  CO2 24  --   GLUCOSE 129* 127*  BUN 21* 24*  CREATININE 0.56* 0.50*  CALCIUM 9.0  --    Liver Function Tests: No results for input(s): AST, ALT, ALKPHOS, BILITOT, PROT, ALBUMIN in the last 168 hours. No results for input(s): LIPASE, AMYLASE in the last 168 hours.  Recent Labs Lab 08/03/14 0605  AMMONIA 122*   CBC:  Recent  Labs Lab 08/03/14 0339 08/03/14 0351  WBC 8.9  --   HGB 7.2* 8.2*  HCT 21.7* 24.0*  MCV 103.3*  --   PLT 262  --    Cardiac Enzymes: No results for input(s): CKTOTAL, CKMB, CKMBINDEX, TROPONINI in the last 168 hours.  BNP (last 3 results) No results for input(s): BNP in the last 8760 hours.  ProBNP (last 3 results) No results for input(s): PROBNP in the last 8760 hours.  CBG:  Recent Labs Lab 08/03/14 0341  GLUCAP 117*    Radiological Exams on Admission: No results found.  EKG: Independently reviewed.  Abnormal findings:  LAD, QTc=487.   Assessment/Plan Principal Problem:   Rectal bleeding Active Problems:   Hypertension   Hyperlipidemia   Cardiac arrest   Acute encephalopathy   Alcohol abuse   Chronic diastolic CHF (congestive heart failure)   GIB (gastrointestinal bleeding)  Rectal bleeding: Etiology is not clear. Differential diagnosis includes diverticular bleeding, peptic ulcer disease, and esophageal varices given his hx of heavy drinking.  - Admit to SDU - 2 units of blood were ordered by ED - GI consulted, will  follow up recommendations - NPO - NS at 50 mL/hr - Start IV pantoprazole - Avoid NSAIDs and SQ heparin - Maintain IV access (2 large bore IVs if possible). - Monitor closely and follow q6h cbc, transfuse as necessary. - LaB: INR, PTT, Lactate  Acute encephalopathy: Etiology is not clear. One potential differential diagnosis is hepatic encephalopathy. Since patient has GI bleeding, I will get GI consultation first before starting lactulose. -get CT-head without contrast to r/o acute intracranial abnormalities -Start IV Rocephin empirically to cover SBP -neuro check q2h  Intermittent abdominal pain: Patient has abdominal pain since January. Etiology is not clear. Currently denies abdominal pain, but the patient has altered mental status now, therefore it is not reliable. SBP is certainly on the differential diagnosis list. -will get CT-abdomen with contrast -follow up GI Recs.  Chronic diastolic CHF (congestive heart failure): 2-D echo on 07/17/13 showed EF 60-65 5% with grade 1 diastolic dysfunction. Patient is on Lasix 20 mg daily and spironolactone 25 mg daily at home. Patient has 1+ leg edema on admission, but seems not acutely exacerbated. Now presents with GIB and AMS. -will hold lasix and spironolactone -Check BNP  HTN:  -Continue Coreg -hold diuretics as above -IV hydralazine when necessary  Alcohol abuse: -Did counseling about the importance of quitting drinking -CIWA protocol  DVT ppx: SCD  Code Status: Full code Family Communication:  Yes, patient's  daughter  at bed side Disposition Plan: Admit to inpatient   Date of Service 08/03/2014    Lorretta Harp [facility] Pager [phone]  If 7PM-7AM, please contact night-coverage [email or web address] Password [email or web address] 08/03/2014, 7:45 AM

## 2014-08-03 NOTE — Progress Notes (Signed)
He was combative and disoriented in endoscopy as we were positioning him for EGD.  I do not think we will be able to sedate him adequately with moderate sedation alone. He has been hemodynamically stable, having only smears of dark stool and so I suspect his acute bleeding has stopped.  He should continue on PPI drip, octreotide drip and we have rescheduled the EGD to tomorrow when anesthesia is available to assist with deep (MAC) sedation.  I will alert his daughter whom I met earlier today.

## 2014-08-03 NOTE — Progress Notes (Signed)
Patient off unit going to ultrasound, CT and endo. H&H and other post transfusion labs are pending. Dr. Randol Kern and lab tech aware of patient's absence. Will call lab and obtain stat labs upon patients return to unit.

## 2014-08-03 NOTE — Interval H&P Note (Signed)
History and Physical Interval Note:  08/03/2014 4:04 PM  Keith [name]  has presented today for surgery, with the diagnosis of GI bleed  The various methods of treatment have been discussed with the patient and family. After consideration of risks, benefits and other options for treatment, the patient has consented to  Procedure(s): ESOPHAGOGASTRODUODENOSCOPY (EGD) (N/A) as a surgical intervention .  The patient's history has been reviewed, patient examined, no change in status, stable for surgery.  I have reviewed the patient's chart and labs.  Questions were answered to the patient's satisfaction.     Rachael Fee

## 2014-08-03 NOTE — ED Notes (Signed)
Pt arrived to the ED from home via EMS who were called out by patients daughter.  Pt's daughter came home this am and had a hard time arousing patient.  Pt had blood on the bottom sheets and presented as lethargic.  Pt base line according to daughter is alert and orientated.

## 2014-08-03 NOTE — Progress Notes (Signed)
Patient brought back to procedure room, found to be wet with condom cath laying in bed.  Pt became agitated and combative while getting cleaned up. MD thought the procedure today would be unsafe under these circumstances therefore per MD, procedure was cancelled and are looking to reschedule for tomorrow as MD stated pt. Was hemodynamically stable.

## 2014-08-03 NOTE — Care Management Note (Signed)
Case Management Note  Patient Details  Name: Keith [name] MRN: [id number] Date of Birth: 1940/03/08  Subjective/Objective:              Gi bleed      Action/Plan: home when stable   Expected Discharge Date:   Crissie Figures)              [id number] Expected Discharge Plan:  Home/Self Care  In-House Referral:  NA  Discharge planning Services  CM Consult  Post Acute Care Choice:  NA Choice offered to:  NA  DME Arranged:  N/A DME Agency:  NA  HH Arranged:  NA HH Agency:  NA  Status of Service:  In process, will continue to follow  Medicare Important Message Given:    Date Medicare IM Given:    Medicare IM give by:    Date Additional Medicare IM Given:    Additional Medicare Important Message give by:     If discussed at Long Length of Stay Meetings, dates discussed:    Additional Comments:  Golda Acre, RN 08/03/2014, 1:32 PM

## 2014-08-03 NOTE — Progress Notes (Signed)
Patient Demographics  Keith [name], is a 74 y.o. male, DOB - 1940-12-22, AVW:[id number]  Admit date - 08/03/2014   Admitting Physician Lorretta Harp, MD  Outpatient Primary MD for the patient is Georgann Housekeeper, MD  LOS - 0   Chief Complaint  Patient presents with  . Rectal Bleeding       Admission HPI/Brief narrative: 74 y.o. male with PMH of hypertension, hyperlipidemia, diastolic congestive heart failure, history of GI bleeding, alcohol abuse, who presents with rectal bleeding, altered mental status and abdominal pain. Patient was found with hemoglobin of 7.2 fused 2 units packed red blood cells, slight hemoglobin 12.1, on your level was found to be at 122. Subjective:   Keith [name] today has, she is hard to arouse given sedative medication for DTs, can't provide any complaints.  Assessment & Plan    Principal Problem:   Rectal bleeding Active Problems:   Hypertension   Hyperlipidemia   Cardiac arrest   Acute encephalopathy   Alcohol abuse   Chronic diastolic CHF (congestive heart failure)   GIB (gastrointestinal bleeding)  GI bleed - She presented with complaints of rectal bleeding, unclear if upper or lower GI bleed, as well unclear if patient had history of varices. - Transfused 2 units packed red blood cells. - Continue with Protonix and octreotide drip. - GI consult greatly appreciated, plan for EGD today. - Monitor H&H closely and transfuse as needed.  Acute encephalopathy - Patient to be multifactorial loading alcohol withdrawals and hepatic encephalopathy with elevated ammonia level. - Continue with lactulose, ammonia levels closely. - follow on CT head   Abdominal pain - Patient to be chronic  - CT abdomen with contrast pending  - Empirically on Rocephin until SBP is ruled out   Chronic diastolic CHF  - 2-D echo May/2015 showing EF 60-65% with grade 1  diastolic dysfunction  - hold Lasix and Aldactone until more stable  - Appears euvolemic   HTN:  -Continue Coreg -hold diuretics as above -IV hydralazine when necessary  Alcohol abuse  - Continue with CIWA  Code Status: full  Family Communication: daughter at bedside  Disposition Plan: Remains in stepdown    Procedures  2 unit packed red blood cell transfusion  6/14    Consults   GI   Medications  Scheduled Meds: . [START ON 08/04/2014] atorvastatin  40 mg Oral q morning - 10a  . cefTRIAXone (ROCEPHIN)  IV  1 g Intravenous Q24H  . folic acid  1 mg Oral Daily  . lactulose  30 g Oral TID  . LORazepam  0-4 mg Intravenous Q6H   Followed by  . [START ON 08/05/2014] LORazepam  0-4 mg Intravenous Q12H  . multivitamin with minerals  1 tablet Oral Daily  . [START ON 08/06/2014] pantoprazole (PROTONIX) IV  40 mg Intravenous Q12H  . sodium chloride  3 mL Intravenous Q12H  . thiamine  100 mg Oral Daily   Or  . thiamine  100 mg Intravenous Daily   Continuous Infusions: . octreotide  (SANDOSTATIN)    IV infusion 50 mcg/hr (08/03/14 0902)  . pantoprozole (PROTONIX) infusion 8 mg/hr (08/03/14 0926)   PRN Meds:.EPINEPHrine, fluticasone, hydrALAZINE, iohexol, ipratropium, ipratropium-albuterol, LORazepam **OR** LORazepam  DVT Prophylaxis   SCDs  Lab Results  Component Value Date   PLT 262 08/03/2014    Antibiotics   Anti-infectives    Start     Dose/Rate Route Frequency Ordered Stop   08/03/14 0745  cefTRIAXone (ROCEPHIN) 1 g in dextrose 5 % 50 mL IVPB     1 g 100 mL/hr over 30 Minutes Intravenous Every 24 hours 08/03/14 0731            Objective:   Filed Vitals:   08/03/14 1015 08/03/14 1019 08/03/14 1100 08/03/14 1200  BP:  192/84  181/74  Pulse:    107  Temp:   98.5 F (36.9 C) 98 F (36.7 C)  TempSrc:   Oral Oral  Resp:    31  Weight:      SpO2: 98%   100%    Wt Readings from Last 3 Encounters:  08/03/14 93 kg (205 lb 0.4 oz)  07/15/12 85.276 kg  (188 lb)  06/20/12 85 kg (187 lb 6.3 oz)     Intake/Output Summary (Last 24 hours) at 08/03/14 1321 Last data filed at 08/03/14 1012  Gross per 24 hour  Intake 1741.66 ml  Output      0 ml  Net 1741.66 ml     Physical exam Lethargic, hard to arouse as received Ativan dry oral mucosa Dry oral mucosa supple Neck,No JVD, No cervical lymphadenopathy appriciated.  Symmetrical Chest wall movement, Good air movement bilaterally, RRR,No Gallops,Rubs or new Murmurs, No Parasternal Heave +ve B.Sounds, Abd Soft,  mildly distended , did not appreciate large ascites ,No tenderness, No organomegaly appriciated, No rebound - guarding or rigidity. No Cyanosis, Clubbing, mild edema, No new Rash or bruise     Data Review   Micro Results Recent Results (from the past 240 hour(s))  MRSA PCR Screening     Status: None   Collection Time: 08/03/14  8:04 AM  Result Value Ref Range Status   MRSA by PCR NEGATIVE NEGATIVE Final    Comment:        The GeneXpert MRSA Assay (FDA approved for NASAL specimens only), is one component of a comprehensive MRSA colonization surveillance program. It is not intended to diagnose MRSA infection nor to guide or monitor treatment for MRSA infections.     Radiology Reports No results found.   CBC  Recent Labs Lab 08/03/14 0339 08/03/14 0351  WBC 8.9  --   HGB 7.2* 8.2*  HCT 21.7* 24.0*  PLT 262  --   MCV 103.3*  --   MCH 34.3*  --   MCHC 33.2  --   RDW 16.5*  --     Chemistries   Recent Labs Lab 08/03/14 0339 08/03/14 0351  NA 140 143  K 4.1 4.1  CL 107 106  CO2 24  --   GLUCOSE 129* 127*  BUN 21* 24*  CREATININE 0.56* 0.50*  CALCIUM 9.0  --    ------------------------------------------------------------------------------------------------------------------ CrCl cannot be calculated (Unknown ideal weight.). ------------------------------------------------------------------------------------------------------------------ No  results for input(s): HGBA1C in the last 72 hours. ------------------------------------------------------------------------------------------------------------------ No results for input(s): CHOL, HDL, LDLCALC, TRIG, CHOLHDL, LDLDIRECT in the last 72 hours. ------------------------------------------------------------------------------------------------------------------ No results for input(s): TSH, T4TOTAL, T3FREE, THYROIDAB in the last 72 hours.  Invalid input(s): FREET3 ------------------------------------------------------------------------------------------------------------------ No results for input(s): VITAMINB12, FOLATE, FERRITIN, TIBC, IRON, RETICCTPCT in the last 72 hours.  Coagulation profile  Recent Labs Lab 08/03/14 0404  INR 1.42    No results for input(s): DDIMER in the last 72 hours.  Cardiac Enzymes No results for  input(s): CKMB, TROPONINI, MYOGLOBIN in the last 168 hours.  Invalid input(s): CK ------------------------------------------------------------------------------------------------------------------ Invalid input(s): POCBNP     Time Spent in minutes  no charge  [facility], Hamzeh Tall M.D on 08/03/2014 at 1:21 PM  Between 7am to 7pm - Pager - [phone]  After 7pm go to [email or web address] - password [facility]  [facility]   Office  [phone]

## 2014-08-04 ENCOUNTER — Encounter (HOSPITAL_COMMUNITY)
Admission: EM | Disposition: A | Discharge status: Home Health | Payer: Self-pay | Source: Home / Self Care | Attending: Internal Medicine

## 2014-08-04 ENCOUNTER — Inpatient Hospital Stay (HOSPITAL_COMMUNITY): Payer: Medicare Other

## 2014-08-04 ENCOUNTER — Encounter (HOSPITAL_COMMUNITY): Payer: Self-pay | Admitting: Anesthesiology

## 2014-08-04 DIAGNOSIS — K7682 Hepatic encephalopathy: Secondary | ICD-10-CM | POA: Diagnosis present

## 2014-08-04 DIAGNOSIS — K7460 Unspecified cirrhosis of liver: Secondary | ICD-10-CM | POA: Diagnosis present

## 2014-08-04 DIAGNOSIS — D62 Acute posthemorrhagic anemia: Secondary | ICD-10-CM | POA: Diagnosis present

## 2014-08-04 DIAGNOSIS — R109 Unspecified abdominal pain: Secondary | ICD-10-CM

## 2014-08-04 DIAGNOSIS — K922 Gastrointestinal hemorrhage, unspecified: Secondary | ICD-10-CM

## 2014-08-04 DIAGNOSIS — K7290 Hepatic failure, unspecified without coma: Secondary | ICD-10-CM | POA: Diagnosis present

## 2014-08-04 LAB — CBC
HCT: 26.6 % — ABNORMAL LOW (ref 39.0–52.0)
HEMOGLOBIN: 8.9 g/dL — AB (ref 13.0–17.0)
MCH: 31.6 pg (ref 26.0–34.0)
MCHC: 33.5 g/dL (ref 30.0–36.0)
MCV: 94.3 fL (ref 78.0–100.0)
PLATELETS: 178 10*3/uL (ref 150–400)
RBC: 2.82 MIL/uL — AB (ref 4.22–5.81)
RDW: 19 % — ABNORMAL HIGH (ref 11.5–15.5)
WBC: 8.7 10*3/uL (ref 4.0–10.5)

## 2014-08-04 LAB — TYPE AND SCREEN
ABO/RH(D): O POS
Antibody Screen: NEGATIVE
UNIT DIVISION: 0
UNIT DIVISION: 0
Unit division: 0

## 2014-08-04 LAB — COMPREHENSIVE METABOLIC PANEL
ALK PHOS: 90 U/L (ref 38–126)
ALT: 36 U/L (ref 17–63)
AST: 90 U/L — ABNORMAL HIGH (ref 15–41)
Albumin: 2.7 g/dL — ABNORMAL LOW (ref 3.5–5.0)
Anion gap: 7 (ref 5–15)
BILIRUBIN TOTAL: 2.5 mg/dL — AB (ref 0.3–1.2)
BUN: 21 mg/dL — ABNORMAL HIGH (ref 6–20)
CO2: 23 mmol/L (ref 22–32)
Calcium: 8.3 mg/dL — ABNORMAL LOW (ref 8.9–10.3)
Chloride: 115 mmol/L — ABNORMAL HIGH (ref 101–111)
Creatinine, Ser: 0.82 mg/dL (ref 0.61–1.24)
GFR calc non Af Amer: >60 mL/min (ref >60)
Glucose, Bld: 158 mg/dL — ABNORMAL HIGH (ref 65–99)
POTASSIUM: 3.7 mmol/L (ref 3.5–5.1)
SODIUM: 145 mmol/L (ref 135–145)
TOTAL PROTEIN: 6.5 g/dL (ref 6.5–8.1)

## 2014-08-04 LAB — URINALYSIS, ROUTINE W REFLEX MICROSCOPIC
Bilirubin Urine: NEGATIVE
Glucose, UA: NEGATIVE mg/dL
Hgb urine dipstick: NEGATIVE
Ketones, ur: NEGATIVE mg/dL
LEUKOCYTES UA: NEGATIVE
Nitrite: NEGATIVE
PROTEIN: NEGATIVE mg/dL
Specific Gravity, Urine: 1.022 (ref 1.005–1.030)
UROBILINOGEN UA: 0.2 mg/dL (ref 0.0–1.0)
pH: 6 (ref 5.0–8.0)

## 2014-08-04 LAB — AMMONIA: AMMONIA: 132 umol/L — AB (ref 9–35)

## 2014-08-04 LAB — BASIC METABOLIC PANEL
ANION GAP: 10 (ref 5–15)
BUN: 20 mg/dL (ref 6–20)
CALCIUM: 8.8 mg/dL — AB (ref 8.9–10.3)
CO2: 24 mmol/L (ref 22–32)
CREATININE: 0.56 mg/dL — AB (ref 0.61–1.24)
Chloride: 114 mmol/L — ABNORMAL HIGH (ref 101–111)
GFR calc non Af Amer: >60 mL/min (ref >60)
Glucose, Bld: 129 mg/dL — ABNORMAL HIGH (ref 65–99)
Potassium: 3.6 mmol/L (ref 3.5–5.1)
Sodium: 148 mmol/L — ABNORMAL HIGH (ref 135–145)

## 2014-08-04 LAB — HEPATITIS B SURFACE ANTIGEN: HEP B S AG: NEGATIVE

## 2014-08-04 LAB — HEMOGLOBIN AND HEMATOCRIT, BLOOD
HCT: 30 % — ABNORMAL LOW (ref 39.0–52.0)
Hemoglobin: 10 g/dL — ABNORMAL LOW (ref 13.0–17.0)

## 2014-08-04 LAB — MAGNESIUM: MAGNESIUM: 2.1 mg/dL (ref 1.7–2.4)

## 2014-08-04 LAB — HEPATITIS A ANTIBODY, TOTAL: Hep A Total Ab: POSITIVE — AB

## 2014-08-04 LAB — HEPATITIS B SURFACE ANTIBODY,QUALITATIVE: HEP B S AB: REACTIVE

## 2014-08-04 LAB — HEPATITIS C ANTIBODY: HCV Ab: 0.3 s/co ratio (ref 0.0–0.9)

## 2014-08-04 SURGERY — EGD (ESOPHAGOGASTRODUODENOSCOPY)
Anesthesia: Monitor Anesthesia Care

## 2014-08-04 SURGERY — CANCELLED PROCEDURE

## 2014-08-04 MED ORDER — POTASSIUM CHLORIDE 10 MEQ/100ML IV SOLN
10.0000 meq | INTRAVENOUS | Status: AC
  Administered 2014-08-04 – 2014-08-05 (×4): 10 meq via INTRAVENOUS
  Filled 2014-08-04 (×2): qty 100

## 2014-08-04 MED ORDER — SODIUM CHLORIDE 0.9 % IJ SOLN
10.0000 mL | INTRAMUSCULAR | Status: DC | PRN
  Administered 2014-08-07: 10 mL
  Administered 2014-08-15: 20 mL
  Administered 2014-08-16: 30 mL
  Filled 2014-08-04 (×3): qty 40

## 2014-08-04 MED ORDER — RIFAXIMIN 550 MG PO TABS
550.0000 mg | ORAL_TABLET | Freq: Two times a day (BID) | ORAL | Status: DC
  Administered 2014-08-04 – 2014-08-10 (×10): 550 mg
  Filled 2014-08-04 (×16): qty 1

## 2014-08-04 MED ORDER — METOPROLOL TARTRATE 1 MG/ML IV SOLN
5.0000 mg | Freq: Three times a day (TID) | INTRAVENOUS | Status: DC
  Administered 2014-08-04 – 2014-08-16 (×36): 5 mg via INTRAVENOUS
  Filled 2014-08-04 (×36): qty 5

## 2014-08-04 MED ORDER — LACTULOSE 10 GM/15ML PO SOLN
45.0000 g | Freq: Three times a day (TID) | ORAL | Status: DC
  Administered 2014-08-04 – 2014-08-05 (×6): 45 g via ORAL
  Filled 2014-08-04 (×9): qty 90

## 2014-08-04 MED ORDER — PROPOFOL 10 MG/ML IV BOLUS
INTRAVENOUS | Status: AC
  Filled 2014-08-04: qty 20

## 2014-08-04 MED ORDER — HYDRALAZINE HCL 20 MG/ML IJ SOLN
10.0000 mg | Freq: Four times a day (QID) | INTRAMUSCULAR | Status: DC | PRN
  Administered 2014-08-04 – 2014-08-06 (×3): 10 mg via INTRAVENOUS
  Filled 2014-08-04 (×4): qty 1

## 2014-08-04 MED ORDER — DEXTROSE 5 % IV SOLN
INTRAVENOUS | Status: DC
  Administered 2014-08-04 – 2014-08-08 (×5): via INTRAVENOUS
  Filled 2014-08-04 (×18): qty 1000

## 2014-08-04 MED ORDER — MORPHINE SULFATE 2 MG/ML IJ SOLN
0.5000 mg | INTRAMUSCULAR | Status: DC | PRN
  Administered 2014-08-04: 0.5 mg via INTRAVENOUS
  Filled 2014-08-04: qty 1

## 2014-08-04 MED ORDER — METOPROLOL TARTRATE 1 MG/ML IV SOLN
INTRAVENOUS | Status: AC
  Administered 2014-08-04: 5 mg via INTRAVENOUS
  Filled 2014-08-04: qty 5

## 2014-08-04 MED ORDER — SODIUM CHLORIDE 0.9 % IJ SOLN
10.0000 mL | Freq: Two times a day (BID) | INTRAMUSCULAR | Status: DC
  Administered 2014-08-04: 20 mL
  Administered 2014-08-06 – 2014-08-15 (×16): 10 mL

## 2014-08-04 MED ORDER — LACTULOSE ENEMA
300.0000 mL | Freq: Two times a day (BID) | ORAL | Status: DC
  Administered 2014-08-04 – 2014-08-10 (×10): 300 mL via RECTAL
  Filled 2014-08-04 (×23): qty 300

## 2014-08-04 SURGICAL SUPPLY — 15 items

## 2014-08-04 NOTE — Progress Notes (Signed)
Spoke with on call re: continued pt lethargy.  Agreed that pt needs rest vs. agitation at this point.  VSS, AF, sats >95% on RA. Reviewed labs. Concerned re: possible rise in ammonia levels due to pt refusing HS lactulose.  On call to order ammonia level to be drawn this am.  Will page with results.  Will cont to monitor pt status.  ~ Marrion Coy, RN

## 2014-08-04 NOTE — Progress Notes (Signed)
OT Cancellation Note  Patient Details Name: Keith [name] MRN: [id number] DOB: 1941/02/15   Cancelled Treatment:    Reason Eval/Treat Not Completed: Fatigue/lethargy limiting ability to participate  Ardis Lawley 08/04/2014, 1:27 PM  Marica Otter, OTR/L [phone] 08/04/2014

## 2014-08-04 NOTE — Progress Notes (Signed)
Abdominal swelling shortly after NG tube placement, KUB shows ileus pattern. NG to LIS, clamping for 2 hours after lactulose.  Contintue PR lactulose BID and xifaxan.  He is obtunded from hep encephalopathy. Seems to be protecting his airway currently but that may change. NG output red tinged, I still do not think he is have active bleeding (HD stable, Hb stable). I discussed all this with daughter in room.

## 2014-08-04 NOTE — Progress Notes (Signed)
[facility] Progress Note    Since last GI note: EGD postponed yesterday due to combativeness, inability to adequately sedate. Was planning EGD for this AM however he is obtunded now.  RN report no clinically significant persistent bleeding (smears of dark stools only).  Objective: Vital signs in last 24 hours: Temp:  [97.5 F (36.4 C)-98.9 F (37.2 C)] 98.5 F (36.9 C) (06/15 0823) Pulse Rate:  [81-108] 81 (06/15 0900) Resp:  [21-42] 24 (06/15 0900) BP: (113-204)/(36-121) 145/64 mmHg (06/15 0900) SpO2:  [94 %-100 %] 100 % (06/15 0900) Weight:  [209 lb 3.5 oz (94.9 kg)] 209 lb 3.5 oz (94.9 kg) (06/15 0400) Last BM Date: 08/03/14 General: alert and oriented times 0 Heart: regular rate and rythm Abdomen: soft, slightly distended,  normal bowel sounds   Lab Results:  Recent Labs  08/03/14 0339  08/03/14 1730 08/03/14 2315 08/04/14 0353  WBC 8.9  --   --   --  8.7  HGB 7.2*  < > 8.7* 9.5* 8.9*  PLT 262  --   --   --  178  MCV 103.3*  --   --   --  94.3  < > = values in this interval not displayed.  Recent Labs  08/03/14 0339 08/03/14 0351 08/04/14 0353  NA 140 143 145  K 4.1 4.1 3.7  CL 107 106 115*  CO2 24  --  23  GLUCOSE 129* 127* 158*  BUN 21* 24* 21*  CREATININE 0.56* 0.50* 0.82  CALCIUM 9.0  --  8.3*    Recent Labs  08/03/14 1730 08/04/14 0353  PROT 6.7 6.5  ALBUMIN 2.9* 2.7*  AST 84* 90*  ALT 34 36  ALKPHOS 91 90  BILITOT 2.3* 2.5*  BILIDIR 0.9*  --   IBILI 1.4*  --     Recent Labs  08/03/14 0404  INR 1.42     Studies/Results: Ct Head Wo Contrast  08/03/2014   CLINICAL DATA:  Altered mental status and confusion. Initial encounter.  EXAM: CT HEAD WITHOUT CONTRAST  TECHNIQUE: Contiguous axial images were obtained from the base of the skull through the vertex without intravenous contrast.  COMPARISON:  Neck CT 05/22/2012.  FINDINGS: Age expected atrophy is present. There is mild motion artifact present on the examination. No  mass lesion, mass effect, midline shift, hydrocephalus, hemorrhage. No territorial ischemia or acute infarction. Mastoid air cells are clear. Intracranial atherosclerosis.  IMPRESSION: No acute intracranial abnormality.  Age-appropriate atrophy.   Electronically Signed   By: Andreas Newport M.D.   On: 08/03/2014 15:16   Ct Abdomen Pelvis W Contrast  08/03/2014   CLINICAL DATA:  74 year old male with a history of rectal bleeding and abdominal pain.  EXAM: CT ABDOMEN AND PELVIS WITH CONTRAST  TECHNIQUE: Multidetector CT imaging of the abdomen and pelvis was performed using the standard protocol following bolus administration of intravenous contrast.  CONTRAST:  OMNIPAQUE IOHEXOL 300 MG/ML  SOLN  COMPARISON:  Chest CT 05/31/2012, ultrasound abdomen 08/03/2014  FINDINGS: Lower chest:  Unremarkable appearance of the soft tissues of the chest wall.  Heart size borderline enlarged.  No pericardial fluid/thickening.  No lower mediastinal adenopathy.  Unremarkable appearance of the distal esophagus.  Small hiatal hernia.  Evidence of esophageal varices.  No confluent airspace disease, pleural fluid, or pneumothorax within visualized lung.  Abdomen/pelvis:  Cirrhotic changes of the liver including nodular liver surface, present on comparison ultrasound.  Unremarkable appearance of the spleen.  Unremarkable appearance the bilateral adrenal glands.  Unremarkable appearance of pancreas.  Gallbladder appears somewhat distended with mild associated inflammatory changes of the pericholecystic fat. Trace free fluid adjacent to the gallbladder.  Inflammatory changes adjacent to the gallbladder extend medially, continuous with the second portion of the duodenum. Ill-defined duodenum wall with associated inflammatory changes on 33 of series 2. Adjacent small lymph nodes. No free air. Small duodenum diverticulum at the pancreatic head. Circumferential duodenum wall thickening in the third portion the duodenum.  No evidence of  right-sided hydronephrosis or nephrolithiasis. 2 cm lesion at the superior medial cortex measures low Hounsfield units on 2 series, compatible with Bosniak 1 cyst. Low-density lesion at the inferior cortex does not enhance, compatible with benign cyst.  No evidence of left-sided hydronephrosis or nephrolithiasis. Sub cm lesions on the left kidney, too small to completely characterize.  No abnormally distended small bowel or colon. Circumferential wall thickening with associated inflammatory changes of the third portion the duodenum. No transition point of the small bowel or colon. No significant stool burden. Multiple colonic diverticula, without associated inflammatory changes of the mesenteric. Normal appendix.  Trace free fluid within the dependent aspects of the abdomen/pelvis.  Evidence of esophageal varices at the lower mediastinum, with engorged right gastric vein and omental veins adjacent to the lesser curvature. No engorged spleen renal shunt.  No significant atherosclerotic disease of the abdominal aorta and iliac vessels. No aneurysm. No dissection flap.  Mesenteric vessels remain patent. Tortuosity of the bilateral iliac system.  Unremarkable appearance of the urinary bladder.  Transverse prostate measures 4.4 cm.  No acute fracture identified. Multilevel degenerative changes of the visualized spine.  IMPRESSION: Inflammatory changes involving the third portion the duodenum, concerning for duodenal ulcer/inflammation. Alternative differential diagnosis includes inflammation related to a groove pancreatitis, or other causes of duodenitis. Correlation with upper endoscopy may be useful.  Borderline dilated gallbladder, without radiopaque stones. If there is concern for cholecystitis, correlation with lab values, physical exam, and nuclear medicine HIDA study may be useful.  Changes of cirrhosis with evidence of portal hypertension and developing esophageal varices.  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  Reactive free fluid within the right abdomen and pelvis.  These results were called by telephone at the time of interpretation on 08/03/2014 at 3:47 pm to Dr. Randol Kern, who verbally acknowledged these results.  Signed,  Yvone Neu. Loreta Ave, DO  [facility]  [facility]   Electronically Signed   By: Gilmer Mor D.O.   On: 08/03/2014 15:48   US Abdomen Limited Ruq  08/03/2014   CLINICAL DATA:  GI bleeding, elevated LFTs, hypertension, ethanol abuse, obesity  EXAM: US ABDOMEN LIMITED - RIGHT UPPER QUADRANT  COMPARISON:  08/02/2014  FINDINGS: Gallbladder:  Distended without stones. Patient sedated, unable to adequately assess for presence of a sonographic Murphy sign. Gallbladder wall appears mildly thickened. No pericholecystic fluid.  Common bile duct:  Diameter: Suboptimally visualized, only a short segment at the porta hepatis seen, 5 mm in diameter, normal  Liver:  Heterogeneous in echogenicity and slightly nodular in appearance suggesting cirrhosis. No discrete hepatic mass or intrahepatic biliary dilatation. No flow was detected in the main portal or LEFT/RIGHT portal veins on color Doppler imaging, though though uncertain if this represents thrombosis, portal hypertension, or technical artifact.  No RIGHT upper quadrant free fluid.  IMPRESSION: Cirrhotic appearing liver.  Unable to detect flow within the portal vein though this could be related to technical limitations of exam; a subsequent CT abdomen exam (please refer  to that report) confirms patency of the portal vein.  Nonspecific thickening of gallbladder wall without visualization of calculi.  Cannot exclude acute cholecystitis with this appearance; consider radionuclide hepatobiliary imaging to assess patency of the cystic duct and exclude acute cholecystitis.   Electronically Signed   By: Ulyses Southward M.D.   On: 08/03/2014 15:14     Medications: Scheduled Meds: . atorvastatin   40 mg Oral q morning - 10a  . cefTRIAXone (ROCEPHIN)  IV  1 g Intravenous Q24H  . folic acid  1 mg Oral Daily  . lactulose  300 mL Rectal BID  . multivitamin with minerals  1 tablet Oral Daily  . [START ON 08/06/2014] pantoprazole (PROTONIX) IV  40 mg Intravenous Q12H  . sodium chloride  3 mL Intravenous Q12H  . thiamine  100 mg Oral Daily   Or  . thiamine  100 mg Intravenous Daily   Continuous Infusions: . sodium chloride 20 mL/hr at 08/04/14 0800  . octreotide  (SANDOSTATIN)    IV infusion 50 mcg/hr (08/04/14 0800)  . pantoprozole (PROTONIX) infusion 8 mg/hr (08/04/14 0800)   PRN Meds:.EPINEPHrine, fluticasone, hydrALAZINE, ipratropium, ipratropium-albuterol, LORazepam    Assessment/Plan: 74 y.o. male with etoh cirrhosis, etoh hepatitis, GI bleed, currently obtunded  GI bleeding appears to have stopped. Very possibly from varices, also ? Raised about duodenal ulcer based on CT. Should continue both IV PPI, IV octreotide.  His mental status is declining.  He is not safe for endoscopy at this point unless he has ET tube in place and he does not appear to need that currently.  Need to try to clear his mental status, encephalopathy.  He was ordered for lactulose enemas, I recommend also NG tube with lactulose through the tube and also should start xifaxan  BID.     Rachael Fee, MD  08/04/2014, 10:07 AM [facility] Pager [phone]

## 2014-08-04 NOTE — Progress Notes (Signed)
Peripherally Inserted Central Catheter/Midline Placement  The IV Nurse has discussed with the patient and/or persons authorized to consent for the patient, the purpose of this procedure and the potential benefits and risks involved with this procedure.  The benefits include less needle sticks, lab draws from the catheter and patient may be discharged home with the catheter.  Risks include, but not limited to, infection, bleeding, blood clot (thrombus formation), and puncture of an artery; nerve damage and irregular heat beat.  Alternatives to this procedure were also discussed.  Consent obtained from daughter who is the POA due to patient being unresponsive at this time.  PICC/Midline Placement Documentation        Haik Mahoney, Lajean Manes 08/04/2014, 10:27 PM

## 2014-08-04 NOTE — Progress Notes (Signed)
[facility] PROGRESS NOTE  JOSHUE BADAL ZOX:[id number] DOB: 02-06-1941 DOA: 08/03/2014 PCP: Georgann Housekeeper, MD  Assessment/Plan: #1 GI bleed Upper GI likely versus lower GI bleed. CT abdomen and pelvis with inflammation in the third portion of the duodenum concerning for duodenal ulcer and also small developing esophageal varices with portal hypertension and changes of cirrhosis noted. Patient currently obtunded right now to undergo EGD per GI. Continue IV PPI and IV octreotide. Xifaxan has been added per GI. Patient status post 2 units packed red blood cells hemoglobin currently at 8.9 from 7.2 on admission. Patient's hemoglobin was 12.1 on 05/13/2013. Follow H&H. Per GI.  #2 acute blood loss anemia Secondary to PROM #1. Patient status post 2 units of packed red blood cells. Hemoglobin currently at 8.9 from 7.2 on admission. Hemoglobin was 12.1 on 05/13/2013.  #3 acute encephalopathy/hepatic encephalopathy Likely secondary to hepatic encephalopathy as patient refused lactulose on day of admission per notes on chart. CT head negative for any acute abnormalities. Ammonia level is elevated and at 132 this morning. Patient is obtunded. GI has recommended placement of a NG tube with lactulose via NG tube. I've also placed orders for lactulose enema. Xifaxan has been ordered per GI. Follow.  #4 intermittent abdominal pain Appears to be chronic in nature. Patient on empiric IV Rocephin until his BPs ruled out. CT abdomen and pelvis with inflammation noted in the third portion of the duodenum concerning for duodenal ulcer and also diverticulosis with small developing esophageal varices and portal hypertension with changes of cirrhosis noted. Patient is currently obtunded. Follow.  #5 cirrhosis Per CT of the abdomen and pelvis. Continue lactulose. Xifaxan has been started per GI. Will likely need follow-up with GI as outpatient. Once patient is alert or likely need to resume his home regimen  of Lasix and spironolactone. Follow for now.  #6 chronic diastolic CHF Stable. 2-D echo from May 2015 with a EF of 60-65% with grade 1 diastolic dysfunction. Lasix and Aldactone on hold for now secondary to problem #1. Follow.  #7 hypertension Diuretics on hold. Continue beta blocker.  #8 alcohol abuse Continue the Ativan withdrawal protocol.  #9 prophylaxis PPI for GI prophylaxis. SCDs for DVT prophylaxis.  Code Status: Full Family Communication: No family at bedside. Disposition Plan: Remain the step down unit.   Consultants:  Gastroenterology: Dr. Christella Hartigan 08/03/2014  Procedures:  CT head 08/03/14  CT abdomen and pelvis 08/03/2014  Abdominal ultrasound 08/03/2014  2 units packed red blood cells 08/03/2014  Antibiotics:  IV Rocephin 08/03/2014  HPI/Subjective: Patient obtunded and unresponsive this morning to verbal or noxious stimuli. Patient was given some Ativan overnight. Patient was post have a EGD yesterday however was postponed secondary to combativeness. No bleeding reported today.  Objective: Filed Vitals:   08/04/14 0900  BP: 145/64  Pulse: 81  Temp:   Resp: 24    Intake/Output Summary (Last 24 hours) at 08/04/14 1029 Last data filed at 08/04/14 0800  Gross per 24 hour  Intake 1905.01 ml  Output    660 ml  Net 1245.01 ml   Filed Weights   08/03/14 0800 08/04/14 0400  Weight: 93 kg (205 lb 0.4 oz) 94.9 kg (209 lb 3.5 oz)    Exam:   General:  Unresponsive to verbal and noxious stimuli  Cardiovascular: RRR  Respiratory: CTAB anterior lung fields  Abdomen: Soft, nontender, nondistended, positive bowel sounds.  Musculoskeletal: No clubbing cyanosis. 1-2+ BLE edema.  Data Reviewed: Basic Metabolic Panel:  Recent Labs Lab 08/03/14 [phone]  08/03/14 0351 08/04/14 0353  NA 140 143 145  K 4.1 4.1 3.7  CL 107 106 115*  CO2 24  --  23  GLUCOSE 129* 127* 158*  BUN 21* 24* 21*  CREATININE 0.56* 0.50* 0.82  CALCIUM 9.0  --  8.3*   Liver  Function Tests:  Recent Labs Lab 08/03/14 1730 08/04/14 0353  AST 84* 90*  ALT 34 36  ALKPHOS 91 90  BILITOT 2.3* 2.5*  PROT 6.7 6.5  ALBUMIN 2.9* 2.7*    Recent Labs Lab 08/03/14 1730  LIPASE 24    Recent Labs Lab 08/03/14 0605 08/04/14 0726  AMMONIA 122* 132*   CBC:  Recent Labs Lab 08/03/14 0339 08/03/14 0351 08/03/14 1730 08/03/14 2315 08/04/14 0353  WBC 8.9  --   --   --  8.7  HGB 7.2* 8.2* 8.7* 9.5* 8.9*  HCT 21.7* 24.0* 25.8* 28.1* 26.6*  MCV 103.3*  --   --   --  94.3  PLT 262  --   --   --  178   Cardiac Enzymes: No results for input(s): CKTOTAL, CKMB, CKMBINDEX, TROPONINI in the last 168 hours. BNP (last 3 results)  Recent Labs  08/03/14 1730  BNP 35.6    ProBNP (last 3 results) No results for input(s): PROBNP in the last 8760 hours.  CBG:  Recent Labs Lab 08/03/14 0341 08/03/14 1103  GLUCAP 117* 96    Recent Results (from the past 240 hour(s))  MRSA PCR Screening     Status: None   Collection Time: 08/03/14  8:04 AM  Result Value Ref Range Status   MRSA by PCR NEGATIVE NEGATIVE Final    Comment:        The GeneXpert MRSA Assay (FDA approved for NASAL specimens only), is one component of a comprehensive MRSA colonization surveillance program. It is not intended to diagnose MRSA infection nor to guide or monitor treatment for MRSA infections.      Studies: Ct Head Wo Contrast  08/03/2014   CLINICAL DATA:  Altered mental status and confusion. Initial encounter.  EXAM: CT HEAD WITHOUT CONTRAST  TECHNIQUE: Contiguous axial images were obtained from the base of the skull through the vertex without intravenous contrast.  COMPARISON:  Neck CT 05/22/2012.  FINDINGS: Age expected atrophy is present. There is mild motion artifact present on the examination. No mass lesion, mass effect, midline shift, hydrocephalus, hemorrhage. No territorial ischemia or acute infarction. Mastoid air cells are clear. Intracranial atherosclerosis.   IMPRESSION: No acute intracranial abnormality.  Age-appropriate atrophy.   Electronically Signed   By: Andreas Newport M.D.   On: 08/03/2014 15:16   Ct Abdomen Pelvis W Contrast  08/03/2014   CLINICAL DATA:  74 year old male with a history of rectal bleeding and abdominal pain.  EXAM: CT ABDOMEN AND PELVIS WITH CONTRAST  TECHNIQUE: Multidetector CT imaging of the abdomen and pelvis was performed using the standard protocol following bolus administration of intravenous contrast.  CONTRAST:  OMNIPAQUE IOHEXOL 300 MG/ML  SOLN  COMPARISON:  Chest CT 05/31/2012, ultrasound abdomen 08/03/2014  FINDINGS: Lower chest:  Unremarkable appearance of the soft tissues of the chest wall.  Heart size borderline enlarged.  No pericardial fluid/thickening.  No lower mediastinal adenopathy.  Unremarkable appearance of the distal esophagus.  Small hiatal hernia.  Evidence of esophageal varices.  No confluent airspace disease, pleural fluid, or pneumothorax within visualized lung.  Abdomen/pelvis:  Cirrhotic changes of the liver including nodular liver surface, present on comparison ultrasound.  Unremarkable  appearance of the spleen.  Unremarkable appearance the bilateral adrenal glands.  Unremarkable appearance of pancreas.  Gallbladder appears somewhat distended with mild associated inflammatory changes of the pericholecystic fat. Trace free fluid adjacent to the gallbladder.  Inflammatory changes adjacent to the gallbladder extend medially, continuous with the second portion of the duodenum. Ill-defined duodenum wall with associated inflammatory changes on 33 of series 2. Adjacent small lymph nodes. No free air. Small duodenum diverticulum at the pancreatic head. Circumferential duodenum wall thickening in the third portion the duodenum.  No evidence of right-sided hydronephrosis or nephrolithiasis. 2 cm lesion at the superior medial cortex measures low Hounsfield units on 2 series, compatible with Bosniak 1 cyst.  Low-density lesion at the inferior cortex does not enhance, compatible with benign cyst.  No evidence of left-sided hydronephrosis or nephrolithiasis. Sub cm lesions on the left kidney, too small to completely characterize.  No abnormally distended small bowel or colon. Circumferential wall thickening with associated inflammatory changes of the third portion the duodenum. No transition point of the small bowel or colon. No significant stool burden. Multiple colonic diverticula, without associated inflammatory changes of the mesenteric. Normal appendix.  Trace free fluid within the dependent aspects of the abdomen/pelvis.  Evidence of esophageal varices at the lower mediastinum, with engorged right gastric vein and omental veins adjacent to the lesser curvature. No engorged spleen renal shunt.  No significant atherosclerotic disease of the abdominal aorta and iliac vessels. No aneurysm. No dissection flap.  Mesenteric vessels remain patent. Tortuosity of the bilateral iliac system.  Unremarkable appearance of the urinary bladder.  Transverse prostate measures 4.4 cm.  No acute fracture identified. Multilevel degenerative changes of the visualized spine.  IMPRESSION: Inflammatory changes involving the third portion the duodenum, concerning for duodenal ulcer/inflammation. Alternative differential diagnosis includes inflammation related to a groove pancreatitis, or other causes of duodenitis. Correlation with upper endoscopy may be useful.  Borderline dilated gallbladder, without radiopaque stones. If there is concern for cholecystitis, correlation with lab values, physical exam, and nuclear medicine HIDA study may be useful.  Changes of cirrhosis with evidence of portal hypertension and developing esophageal varices.  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  Reactive free fluid within the right abdomen and pelvis.  These results were called by telephone at the time of interpretation on  08/03/2014 at 3:47 pm to Dr. Randol Kern, who verbally acknowledged these results.  Signed,  Yvone Neu. Loreta Ave, DO  [facility]  [facility]   Electronically Signed   By: Gilmer Mor D.O.   On: 08/03/2014 15:48   US Abdomen Limited Ruq  08/03/2014   CLINICAL DATA:  GI bleeding, elevated LFTs, hypertension, ethanol abuse, obesity  EXAM: US ABDOMEN LIMITED - RIGHT UPPER QUADRANT  COMPARISON:  08/02/2014  FINDINGS: Gallbladder:  Distended without stones. Patient sedated, unable to adequately assess for presence of a sonographic Murphy sign. Gallbladder wall appears mildly thickened. No pericholecystic fluid.  Common bile duct:  Diameter: Suboptimally visualized, only a short segment at the porta hepatis seen, 5 mm in diameter, normal  Liver:  Heterogeneous in echogenicity and slightly nodular in appearance suggesting cirrhosis. No discrete hepatic mass or intrahepatic biliary dilatation. No flow was detected in the main portal or LEFT/RIGHT portal veins on color Doppler imaging, though though uncertain if this represents thrombosis, portal hypertension, or technical artifact.  No RIGHT upper quadrant free fluid.  IMPRESSION: Cirrhotic appearing liver.  Unable to detect flow within the portal vein though this could be related  to technical limitations of exam; a subsequent CT abdomen exam (please refer to that report) confirms patency of the portal vein.  Nonspecific thickening of gallbladder wall without visualization of calculi.  Cannot exclude acute cholecystitis with this appearance; consider radionuclide hepatobiliary imaging to assess patency of the cystic duct and exclude acute cholecystitis.   Electronically Signed   By: Ulyses Southward M.D.   On: 08/03/2014 15:14    Scheduled Meds: . atorvastatin  40 mg Oral q morning - 10a  . cefTRIAXone (ROCEPHIN)  IV  1 g Intravenous Q24H  . folic acid  1 mg Oral Daily  . lactulose  45 g Oral TID  . lactulose  300 mL Rectal  BID  . multivitamin with minerals  1 tablet Oral Daily  . [START ON 08/06/2014] pantoprazole (PROTONIX) IV  40 mg Intravenous Q12H  . rifaximin  550 mg Per Tube BID  . sodium chloride  3 mL Intravenous Q12H  . thiamine  100 mg Oral Daily   Or  . thiamine  100 mg Intravenous Daily   Continuous Infusions: . sodium chloride 20 mL/hr at 08/04/14 0800  . octreotide  (SANDOSTATIN)    IV infusion 50 mcg/hr (08/04/14 0800)  . pantoprozole (PROTONIX) infusion 8 mg/hr (08/04/14 0800)    Principal Problem:   GI bleed Active Problems:   Acute encephalopathy   Encephalopathy, hepatic   Hypertension   Hyperlipidemia   Cardiac arrest   Rectal bleeding   Alcohol abuse   Chronic diastolic CHF (congestive heart failure)   GIB (gastrointestinal bleeding)   Abdominal pain   Hepatic cirrhosis    Time spent: 40 mins    [facility] MD [facility] Pager [phone]. If 7PM-7AM, please contact night-coverage at [email or web address], password [facility] 08/04/2014, 10:29 AM  LOS: 1 day

## 2014-08-04 NOTE — Anesthesia Preprocedure Evaluation (Deleted)
Anesthesia Evaluation  Patient identified by MRN, date of birth, ID band Patient awake    Reviewed: Allergy & Precautions, NPO status , Patient's Chart, lab work & pertinent test results  History of Anesthesia Complications Negative for: history of anesthetic complications  Airway Mallampati: II  TM Distance: >3 FB Neck ROM: Full    Dental no notable dental hx. (+) Dental Advisory Given   Pulmonary neg pulmonary ROS,  breath sounds clear to auscultation  Pulmonary exam normal       Cardiovascular hypertension, Pt. on medications +CHF Normal cardiovascular examRhythm:Regular Rate:Normal     Neuro/Psych PSYCHIATRIC DISORDERS negative neurological ROS     GI/Hepatic negative GI ROS, (+)     substance abuse  alcohol use,   Endo/Other  negative endocrine ROS  Renal/GU negative Renal ROS  negative genitourinary   Musculoskeletal negative musculoskeletal ROS (+)   Abdominal   Peds negative pediatric ROS (+)  Hematology  (+) anemia ,   Anesthesia Other Findings   Reproductive/Obstetrics negative OB ROS                             Anesthesia Physical Anesthesia Plan  ASA: III  Anesthesia Plan: MAC   Post-op Pain Management:    Induction: Intravenous  Airway Management Planned: Mask  Additional Equipment:   Intra-op Plan:   Post-operative Plan: Extubation in OR  Informed Consent: I have reviewed the patients History and Physical, chart, labs and discussed the procedure including the risks, benefits and alternatives for the proposed anesthesia with the patient or authorized representative who has indicated his/her understanding and acceptance.   Dental advisory given  Plan Discussed with: CRNA  Anesthesia Plan Comments:         Anesthesia Quick Evaluation

## 2014-08-04 NOTE — Progress Notes (Signed)
Pt receiving PRBC unit [id number] on initial assessment at 1930.  Blood started at 1844 per Transfusion Form. 15 minute VS charted per dayshift RN. Per report at change of shift, next set of VS due at 2000.  At 2000 VS as follows, Temp 98.2 Ax, HR 96, Resp 23, BP 180/90, 98% RA. Next 1hr VS at 2100 as follows, Temp 98.1 Ax, HR 94, Resp 30, BP 164/64, 97% RA. Blood completed at 2112.  Completion VS as follows, Temp 98.1 Ax, HR 99, Resp 32, BP 195/91, 98% RA. No reaction suspected. Total volume per Transfusion form. ~ Marrion Coy, RN

## 2014-08-04 NOTE — Progress Notes (Signed)
PT Cancellation Note  Patient Details Name: Keith [name] MRN: [id number] DOB: 02-13-1941   Cancelled Treatment:      PT eval received but deferred 2* pt fatigue/lethargy limiting ability to participate.  Will follow.  Lenny Fiumara 08/04/2014, 2:49 PM

## 2014-08-05 ENCOUNTER — Inpatient Hospital Stay (HOSPITAL_COMMUNITY): Payer: Medicare Other

## 2014-08-05 DIAGNOSIS — G9340 Encephalopathy, unspecified: Secondary | ICD-10-CM

## 2014-08-05 DIAGNOSIS — E870 Hyperosmolality and hypernatremia: Secondary | ICD-10-CM

## 2014-08-05 DIAGNOSIS — K560 Paralytic ileus: Secondary | ICD-10-CM | POA: Diagnosis not present

## 2014-08-05 LAB — CBC
HEMATOCRIT: 31.4 % — AB (ref 39.0–52.0)
Hemoglobin: 10.3 g/dL — ABNORMAL LOW (ref 13.0–17.0)
MCH: 32.2 pg (ref 26.0–34.0)
MCHC: 32.8 g/dL (ref 30.0–36.0)
MCV: 98.1 fL (ref 78.0–100.0)
Platelets: 237 10*3/uL (ref 150–400)
RBC: 3.2 MIL/uL — ABNORMAL LOW (ref 4.22–5.81)
RDW: 19.3 % — ABNORMAL HIGH (ref 11.5–15.5)
WBC: 12.3 10*3/uL — AB (ref 4.0–10.5)

## 2014-08-05 LAB — COMPREHENSIVE METABOLIC PANEL
ALBUMIN: 3 g/dL — AB (ref 3.5–5.0)
ALK PHOS: 95 U/L (ref 38–126)
ALT: 42 U/L (ref 17–63)
AST: 99 U/L — AB (ref 15–41)
Anion gap: 14 (ref 5–15)
BUN: 18 mg/dL (ref 6–20)
CALCIUM: 7.7 mg/dL — AB (ref 8.9–10.3)
CO2: 22 mmol/L (ref 22–32)
Chloride: 112 mmol/L — ABNORMAL HIGH (ref 101–111)
Creatinine, Ser: 0.68 mg/dL (ref 0.61–1.24)
GFR calc non Af Amer: >60 mL/min (ref >60)
Glucose, Bld: 161 mg/dL — ABNORMAL HIGH (ref 65–99)
POTASSIUM: 3.5 mmol/L (ref 3.5–5.1)
Sodium: 148 mmol/L — ABNORMAL HIGH (ref 135–145)
Total Bilirubin: 2.3 mg/dL — ABNORMAL HIGH (ref 0.3–1.2)
Total Protein: 6.9 g/dL (ref 6.5–8.1)

## 2014-08-05 LAB — MAGNESIUM: Magnesium: 2.1 mg/dL (ref 1.7–2.4)

## 2014-08-05 LAB — GLUCOSE, CAPILLARY: GLUCOSE-CAPILLARY: 128 mg/dL — AB (ref 65–99)

## 2014-08-05 LAB — PROTIME-INR
INR: 1.46 (ref 0.00–1.49)
PROTHROMBIN TIME: 17.9 s — AB (ref 11.6–15.2)

## 2014-08-05 LAB — AMMONIA: Ammonia: 71 umol/L — ABNORMAL HIGH (ref 9–35)

## 2014-08-05 MED ORDER — POTASSIUM CHLORIDE 10 MEQ/100ML IV SOLN
10.0000 meq | INTRAVENOUS | Status: AC
  Administered 2014-08-05 (×5): 10 meq via INTRAVENOUS
  Filled 2014-08-05 (×2): qty 100

## 2014-08-05 MED ORDER — LORAZEPAM 2 MG/ML IJ SOLN
1.0000 mg | INTRAMUSCULAR | Status: DC | PRN
  Administered 2014-08-05 (×2): 1 mg via INTRAVENOUS
  Administered 2014-08-05: 2 mg via INTRAVENOUS
  Administered 2014-08-06: 1 mg via INTRAVENOUS
  Administered 2014-08-06 (×2): 2 mg via INTRAVENOUS
  Filled 2014-08-05 (×5): qty 1

## 2014-08-05 MED ORDER — MORPHINE SULFATE 2 MG/ML IJ SOLN
1.0000 mg | INTRAMUSCULAR | Status: DC | PRN
  Administered 2014-08-05 – 2014-08-06 (×4): 1 mg via INTRAVENOUS
  Filled 2014-08-05 (×4): qty 1

## 2014-08-05 NOTE — Progress Notes (Signed)
Patient hearing aid hanging out of left ear. Hearing aid removed from ear, placed into denture cup with lid and given to daughter Kennith Center) at bedside.

## 2014-08-05 NOTE — Progress Notes (Signed)
[facility] Progress Note  Subjective:   Hgb stable.Ammonia down to 71 from 132. Sleepy.Daughter says he tried to pull NGT a little while ago and was given ativan.   Objective:  Vital signs in last 24 hours: Temp:  [97.1 F (36.2 C)-98.4 F (36.9 C)] 97.9 F (36.6 C) (06/16 0800) Pulse Rate:  [83-113] 84 (06/16 0700) Resp:  [20-33] 20 (06/16 0700) BP: (126-203)/(62-99) 131/65 mmHg (06/16 0700) SpO2:  [90 %-100 %] 98 % (06/16 0700) Last BM Date: 08/03/14 General:somnolent Heart:  Regular rate and rhythm; no murmurs Pulm;lungs clear Abdomen:  Soft, slightly distended, Normal bowel sounds, without guarding, and without rebound.   Extremities:  Without edema.   Intake/Output from previous day: 06/15 0701 - 06/16 0700 In: 2397.5 [I.V.:1747.5; NG/GT:600; IV Piggyback:50] Out: 4100 [Urine:2450; Emesis/NG output:1200; Stool:450] Intake/Output this shift:    Lab Results:  Recent Labs  08/03/14 0339  08/04/14 0353 08/04/14 1507 08/05/14 0620  WBC 8.9  --  8.7  --  12.3*  HGB 7.2*  < > 8.9* 10.0* 10.3*  HCT 21.7*  < > 26.6* 30.0* 31.4*  PLT 262  --  178  --  237  < > = values in this interval not displayed. BMET  Recent Labs  08/04/14 0353 08/04/14 1620 08/05/14 0620  NA 145 148* 148*  K 3.7 3.6 3.5  CL 115* 114* 112*  CO2 GLUCOSE 158* 129* 161*  BUN 21* 20 18  CREATININE 0.82 0.56* 0.68  CALCIUM 8.3* 8.8* 7.7*   LFT  Recent Labs  08/03/14 1730  08/05/14 0620  PROT 6.7  < > 6.9  ALBUMIN 2.9*  < > 3.0*  AST 84*  < > 99*  ALT 34  < > 42  ALKPHOS 91  < > 95  BILITOT 2.3*  < > 2.3*  BILIDIR 0.9*  --   --   IBILI 1.4*  --   --   < > = values in this interval not displayed. PT/INR  Recent Labs  08/03/14 0404 08/05/14 0620  LABPROT 17.4* 17.9*  INR 1.42 1.46   Hepatitis Panel  Recent Labs  08/03/14 1730  HEPBSAG Negative  HCVAB 0.3    Dg Abd 1 View  08/04/2014   CLINICAL DATA:  Abdominal distension  EXAM:  ABDOMEN - 1 VIEW  COMPARISON:  CT scan 08/03/2014  FINDINGS: Air-filled small bowel and colon with mild distention suggesting a diffuse ileus. No obvious free air. There is an NG tube in the stomach.  IMPRESSION: Diffuse ileus bowel gas pattern.   Electronically Signed   By: Rudie Meyer M.D.   On: 08/04/2014 15:41   Ct Head Wo Contrast  08/03/2014   CLINICAL DATA:  Altered mental status and confusion. Initial encounter.  EXAM: CT HEAD WITHOUT CONTRAST  TECHNIQUE: Contiguous axial images were obtained from the base of the skull through the vertex without intravenous contrast.  COMPARISON:  Neck CT 05/22/2012.  FINDINGS: Age expected atrophy is present. There is mild motion artifact present on the examination. No mass lesion, mass effect, midline shift, hydrocephalus, hemorrhage. No territorial ischemia or acute infarction. Mastoid air cells are clear. Intracranial atherosclerosis.  IMPRESSION: No acute intracranial abnormality.  Age-appropriate atrophy.   Electronically Signed   By: Andreas Newport M.D.   On: 08/03/2014 15:16   Ct Abdomen Pelvis W Contrast  08/03/2014   CLINICAL DATA:  74 year old male with a history of rectal bleeding and abdominal pain.  EXAM: CT  ABDOMEN AND PELVIS WITH CONTRAST  TECHNIQUE: Multidetector CT imaging of the abdomen and pelvis was performed using the standard protocol following bolus administration of intravenous contrast.  CONTRAST:  OMNIPAQUE IOHEXOL 300 MG/ML  SOLN  COMPARISON:  Chest CT 05/31/2012, ultrasound abdomen 08/03/2014  FINDINGS: Lower chest:  Unremarkable appearance of the soft tissues of the chest wall.  Heart size borderline enlarged.  No pericardial fluid/thickening.  No lower mediastinal adenopathy.  Unremarkable appearance of the distal esophagus.  Small hiatal hernia.  Evidence of esophageal varices.  No confluent airspace disease, pleural fluid, or pneumothorax within visualized lung.  Abdomen/pelvis:  Cirrhotic changes of the liver including nodular  liver surface, present on comparison ultrasound.  Unremarkable appearance of the spleen.  Unremarkable appearance the bilateral adrenal glands.  Unremarkable appearance of pancreas.  Gallbladder appears somewhat distended with mild associated inflammatory changes of the pericholecystic fat. Trace free fluid adjacent to the gallbladder.  Inflammatory changes adjacent to the gallbladder extend medially, continuous with the second portion of the duodenum. Ill-defined duodenum wall with associated inflammatory changes on 33 of series 2. Adjacent small lymph nodes. No free air. Small duodenum diverticulum at the pancreatic head. Circumferential duodenum wall thickening in the third portion the duodenum.  No evidence of right-sided hydronephrosis or nephrolithiasis. 2 cm lesion at the superior medial cortex measures low Hounsfield units on 2 series, compatible with Bosniak 1 cyst. Low-density lesion at the inferior cortex does not enhance, compatible with benign cyst.  No evidence of left-sided hydronephrosis or nephrolithiasis. Sub cm lesions on the left kidney, too small to completely characterize.  No abnormally distended small bowel or colon. Circumferential wall thickening with associated inflammatory changes of the third portion the duodenum. No transition point of the small bowel or colon. No significant stool burden. Multiple colonic diverticula, without associated inflammatory changes of the mesenteric. Normal appendix.  Trace free fluid within the dependent aspects of the abdomen/pelvis.  Evidence of esophageal varices at the lower mediastinum, with engorged right gastric vein and omental veins adjacent to the lesser curvature. No engorged spleen renal shunt.  No significant atherosclerotic disease of the abdominal aorta and iliac vessels. No aneurysm. No dissection flap.  Mesenteric vessels remain patent. Tortuosity of the bilateral iliac system.  Unremarkable appearance of the urinary bladder.  Transverse  prostate measures 4.4 cm.  No acute fracture identified. Multilevel degenerative changes of the visualized spine.  IMPRESSION: Inflammatory changes involving the third portion the duodenum, concerning for duodenal ulcer/inflammation. Alternative differential diagnosis includes inflammation related to a groove pancreatitis, or other causes of duodenitis. Correlation with upper endoscopy may be useful.  Borderline dilated gallbladder, without radiopaque stones. If there is concern for cholecystitis, correlation with lab values, physical exam, and nuclear medicine HIDA study may be useful.  Changes of cirrhosis with evidence of portal hypertension and developing esophageal varices.  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  Reactive free fluid within the right abdomen and pelvis.  These results were called by telephone at the time of interpretation on 08/03/2014 at 3:47 pm to Dr. Randol Kern, who verbally acknowledged these results.  Signed,  Yvone Neu. Loreta Ave, DO  [facility]  [facility]   Electronically Signed   By: Gilmer Mor D.O.   On: 08/03/2014 15:48   Dg Abd Acute W/chest  08/05/2014   CLINICAL DATA:  Abdominal distention anemia  EXAM: DG ABDOMEN ACUTE W/ 1V CHEST  COMPARISON:  X-ray abdomen 08/04/2014  FINDINGS: NG tube extends the stomach. There  is central venous pulmonary congestion. No free air beneath hemidiaphragms.  Gas distended loops of large and small bowel. Loops of small bowel are mildly dilated 3.6 cm. Loops colon within normal limits. There is gas rectum. On the decubitus view, there are air-fluid levels within the bowel. No intraperitoneal free air.  IMPRESSION: Gas-filled loops of large and small bowel with minimal distention and air-fluid levels suggesting ileus. No intraperitoneal free air.   Electronically Signed   By: Genevive Bi M.D.   On: 08/05/2014 08:45   US Abdomen Limited Ruq  08/03/2014   CLINICAL DATA:  GI  bleeding, elevated LFTs, hypertension, ethanol abuse, obesity  EXAM: US ABDOMEN LIMITED - RIGHT UPPER QUADRANT  COMPARISON:  08/02/2014  FINDINGS: Gallbladder:  Distended without stones. Patient sedated, unable to adequately assess for presence of a sonographic Murphy sign. Gallbladder wall appears mildly thickened. No pericholecystic fluid.  Common bile duct:  Diameter: Suboptimally visualized, only a short segment at the porta hepatis seen, 5 mm in diameter, normal  Liver:  Heterogeneous in echogenicity and slightly nodular in appearance suggesting cirrhosis. No discrete hepatic mass or intrahepatic biliary dilatation. No flow was detected in the main portal or LEFT/RIGHT portal veins on color Doppler imaging, though though uncertain if this represents thrombosis, portal hypertension, or technical artifact.  No RIGHT upper quadrant free fluid.  IMPRESSION: Cirrhotic appearing liver.  Unable to detect flow within the portal vein though this could be related to technical limitations of exam; a subsequent CT abdomen exam (please refer to that report) confirms patency of the portal vein.  Nonspecific thickening of gallbladder wall without visualization of calculi.  Cannot exclude acute cholecystitis with this appearance; consider radionuclide hepatobiliary imaging to assess patency of the cystic duct and exclude acute cholecystitis.   Electronically Signed   By: Ulyses Southward M.D.   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 No evidence of further bleed. Still with AMS--not safe for endoscopy unless intubated. Cont PPI, octreotide, lactulose, xifaxan.      LOS: 2 days   Hvozdovic, Tollie Pizza PA-C 08/05/2014, Pager [phone]   ________________________________________________________________________  [facility] MD note:  I personally examined the patient, reviewed the data and agree with the assessment and plan described  above.  Need to clear his encephalopathy before any further testing, procedures. Continue NG lactulose, enema lactulose, xifaxan.     Rob Bunting, MD [facility] Pager [phone]

## 2014-08-05 NOTE — Progress Notes (Signed)
[facility] PROGRESS NOTE  Keith [name] ZOX:[id number] DOB: 1940/05/04 DOA: 08/03/2014 PCP: Georgann Housekeeper, MD  Assessment/Plan: #1 GI bleed Upper GI likely versus lower GI bleed. CT abdomen and pelvis with inflammation in the third portion of the duodenum concerning for duodenal ulcer and also small developing esophageal varices with portal hypertension and changes of cirrhosis noted. Patient currently with an acute encephalopathy that seems to be slowly improving as he opens his eyes to verbal stimuli however just back off to sleep. EGD was canceled yesterday. Continue IV PPI and IV octreotide. Xifaxan has been added per GI. Patient status post 2 units packed red blood cells hemoglobin currently at 10.3 from 8.9 from 7.2 on admission. Patient's hemoglobin was 12.1 on 05/13/2013. Follow H&H. Per GI.  #2 acute blood loss anemia Secondary to PROM #1. Patient status post 2 units of packed red blood cells. Hemoglobin currently at 10.3 from 8.9 from 7.2 on admission. Hemoglobin was 12.1 on 05/13/2013.  #3 acute encephalopathy/hepatic encephalopathy Likely secondary to hepatic encephalopathy as patient unable to get lactulose secondary to altered mental status. Slight improvement with mental status as patient opens eyes to verbal stimuli however drifts back off to sleep. CT head negative for any acute abnormalities. Ammonia level was elevated at 132 yesterday and is trending down currently at 71 today. Continue NG tube. Continue lactulose enemas and Xifaxan. GI following.  #4 intermittent abdominal pain Appears to be chronic in nature. Patient on empiric IV Rocephin until his BPs ruled out. CT abdomen and pelvis with inflammation noted in the third portion of the duodenum concerning for duodenal ulcer and also diverticulosis with small developing esophageal varices and portal hypertension with changes of cirrhosis noted. Follow.  #5 ileus Questionable etiology. Patient has NGT to low  intermittent suction. Patient with some bowel sounds however abdomen distended. Keep potassium greater than 4. Keep magnesium greater than 2. Follow.  #6 cirrhosis Per CT of the abdomen and pelvis. Continue lactulose enemas twice a day. Continue Xifaxan. Will likely need follow-up with GI as outpatient. Once patient is alert or likely need to resume his home regimen of Lasix and spironolactone. Follow for now.  #7 chronic diastolic CHF Stable. 2-D echo from May 2015 with a EF of 60-65% with grade 1 diastolic dysfunction. Lasix and Aldactone on hold for now secondary to problem #1. Follow.  #8 hypertension Diuretics on hold. Continue beta blocker.  #9 alcohol abuse Continue the Ativan withdrawal protocol.  #10 hypernatremia Increase D5W2 to 100 mL per hour. Follow.  #11 prophylaxis PPI for GI prophylaxis. SCDs for DVT prophylaxis.  Code Status: Full Family Communication: Updated daughter at bedside.  Disposition Plan: Remain the step down unit.   Consultants:  Gastroenterology: Dr. Christella Hartigan 08/03/2014  Procedures:  CT head 08/03/14  CT abdomen and pelvis 08/03/2014  Abdominal ultrasound 08/03/2014  2 units packed red blood cells 08/03/2014  PICC line 08/04/2014  Antibiotics:  IV Rocephin 08/03/2014  HPI/Subjective: Patient opens eyes to verbal stimuli however drifts back off to sleep.  Objective: Filed Vitals:   08/05/14 0800  BP:   Pulse:   Temp: 97.9 F (36.6 C)  Resp:     Intake/Output Summary (Last 24 hours) at 08/05/14 0957 Last data filed at 08/05/14 0700  Gross per 24 hour  Intake 2277.5 ml  Output   4100 ml  Net -1822.5 ml   Filed Weights   08/03/14 0800 08/04/14 0400  Weight: 93 kg (205 lb 0.4 oz) 94.9 kg (209 lb 3.5 oz)  Exam:   General:  Opens eyes to verbal stimuli however drifts back off to sleep.  Cardiovascular: RRR  Respiratory: CTAB anterior lung fields  Abdomen: Soft, nontender, nondistended, positive bowel  sounds.  Musculoskeletal: No clubbing cyanosis. 1+ BLE edema.  Data Reviewed: Basic Metabolic Panel:  Recent Labs Lab 08/03/14 0339 08/03/14 0351 08/04/14 0353 08/04/14 1620 08/05/14 0620  NA 140 143 145 148* 148*  K 4.1 4.1 3.7 3.6 3.5  CL 107 106 115* 114* 112*  CO2 24  --  GLUCOSE 129* 127* 158* 129* 161*  BUN 21* 24* 21* 20 18  CREATININE 0.56* 0.50* 0.82 0.56* 0.68  CALCIUM 9.0  --  8.3* 8.8* 7.7*  MG  --   --   --  2.1 2.1   Liver Function Tests:  Recent Labs Lab 08/03/14 1730 08/04/14 0353 08/05/14 0620  AST 84* 90* 99*  ALT 34 36 42  ALKPHOS 91 90 95  BILITOT 2.3* 2.5* 2.3*  PROT 6.7 6.5 6.9  ALBUMIN 2.9* 2.7* 3.0*    Recent Labs Lab 08/03/14 1730  LIPASE 24    Recent Labs Lab 08/03/14 0605 08/04/14 0726 08/05/14 0620  AMMONIA 122* 132* 71*   CBC:  Recent Labs Lab 08/03/14 0339  08/03/14 1730 08/03/14 2315 08/04/14 0353 08/04/14 1507 08/05/14 0620  WBC 8.9  --   --   --  8.7  --  12.3*  HGB 7.2*  < > 8.7* 9.5* 8.9* 10.0* 10.3*  HCT 21.7*  < > 25.8* 28.1* 26.6* 30.0* 31.4*  MCV 103.3*  --   --   --  94.3  --  98.1  PLT 262  --   --   --  178  --  237  < > = values in this interval not displayed. Cardiac Enzymes: No results for input(s): CKTOTAL, CKMB, CKMBINDEX, TROPONINI in the last 168 hours. BNP (last 3 results)  Recent Labs  08/03/14 1730  BNP 35.6    ProBNP (last 3 results) No results for input(s): PROBNP in the last 8760 hours.  CBG:  Recent Labs Lab 08/03/14 0341 08/03/14 1103 08/05/14 0829  GLUCAP 117* 96 128*    Recent Results (from the past 240 hour(s))  MRSA PCR Screening     Status: None   Collection Time: 08/03/14  8:04 AM  Result Value Ref Range Status   MRSA by PCR NEGATIVE NEGATIVE Final    Comment:        The GeneXpert MRSA Assay (FDA approved for NASAL specimens only), is one component of a comprehensive MRSA colonization surveillance program. It is not intended to diagnose  MRSA infection nor to guide or monitor treatment for MRSA infections.      Studies: Dg Abd 1 View  08/04/2014   CLINICAL DATA:  Abdominal distension  EXAM: ABDOMEN - 1 VIEW  COMPARISON:  CT scan 08/03/2014  FINDINGS: Air-filled small bowel and colon with mild distention suggesting a diffuse ileus. No obvious free air. There is an NG tube in the stomach.  IMPRESSION: Diffuse ileus bowel gas pattern.   Electronically Signed   By: Rudie Meyer M.D.   On: 08/04/2014 15:41   Ct Head Wo Contrast  08/03/2014   CLINICAL DATA:  Altered mental status and confusion. Initial encounter.  EXAM: CT HEAD WITHOUT CONTRAST  TECHNIQUE: Contiguous axial images were obtained from the base of the skull through the vertex without intravenous contrast.  COMPARISON:  Neck CT 05/22/2012.  FINDINGS: Age expected atrophy  is present. There is mild motion artifact present on the examination. No mass lesion, mass effect, midline shift, hydrocephalus, hemorrhage. No territorial ischemia or acute infarction. Mastoid air cells are clear. Intracranial atherosclerosis.  IMPRESSION: No acute intracranial abnormality.  Age-appropriate atrophy.   Electronically Signed   By: Andreas Newport M.D.   On: 08/03/2014 15:16   Ct Abdomen Pelvis W Contrast  08/03/2014   CLINICAL DATA:  74 year old male with a history of rectal bleeding and abdominal pain.  EXAM: CT ABDOMEN AND PELVIS WITH CONTRAST  TECHNIQUE: Multidetector CT imaging of the abdomen and pelvis was performed using the standard protocol following bolus administration of intravenous contrast.  CONTRAST:  OMNIPAQUE IOHEXOL 300 MG/ML  SOLN  COMPARISON:  Chest CT 05/31/2012, ultrasound abdomen 08/03/2014  FINDINGS: Lower chest:  Unremarkable appearance of the soft tissues of the chest wall.  Heart size borderline enlarged.  No pericardial fluid/thickening.  No lower mediastinal adenopathy.  Unremarkable appearance of the distal esophagus.  Small hiatal hernia.  Evidence of  esophageal varices.  No confluent airspace disease, pleural fluid, or pneumothorax within visualized lung.  Abdomen/pelvis:  Cirrhotic changes of the liver including nodular liver surface, present on comparison ultrasound.  Unremarkable appearance of the spleen.  Unremarkable appearance the bilateral adrenal glands.  Unremarkable appearance of pancreas.  Gallbladder appears somewhat distended with mild associated inflammatory changes of the pericholecystic fat. Trace free fluid adjacent to the gallbladder.  Inflammatory changes adjacent to the gallbladder extend medially, continuous with the second portion of the duodenum. Ill-defined duodenum wall with associated inflammatory changes on 33 of series 2. Adjacent small lymph nodes. No free air. Small duodenum diverticulum at the pancreatic head. Circumferential duodenum wall thickening in the third portion the duodenum.  No evidence of right-sided hydronephrosis or nephrolithiasis. 2 cm lesion at the superior medial cortex measures low Hounsfield units on 2 series, compatible with Bosniak 1 cyst. Low-density lesion at the inferior cortex does not enhance, compatible with benign cyst.  No evidence of left-sided hydronephrosis or nephrolithiasis. Sub cm lesions on the left kidney, too small to completely characterize.  No abnormally distended small bowel or colon. Circumferential wall thickening with associated inflammatory changes of the third portion the duodenum. No transition point of the small bowel or colon. No significant stool burden. Multiple colonic diverticula, without associated inflammatory changes of the mesenteric. Normal appendix.  Trace free fluid within the dependent aspects of the abdomen/pelvis.  Evidence of esophageal varices at the lower mediastinum, with engorged right gastric vein and omental veins adjacent to the lesser curvature. No engorged spleen renal shunt.  No significant atherosclerotic disease of the abdominal aorta and iliac vessels.  No aneurysm. No dissection flap.  Mesenteric vessels remain patent. Tortuosity of the bilateral iliac system.  Unremarkable appearance of the urinary bladder.  Transverse prostate measures 4.4 cm.  No acute fracture identified. Multilevel degenerative changes of the visualized spine.  IMPRESSION: Inflammatory changes involving the third portion the duodenum, concerning for duodenal ulcer/inflammation. Alternative differential diagnosis includes inflammation related to a groove pancreatitis, or other causes of duodenitis. Correlation with upper endoscopy may be useful.  Borderline dilated gallbladder, without radiopaque stones. If there is concern for cholecystitis, correlation with lab values, physical exam, and nuclear medicine HIDA study may be useful.  Changes of cirrhosis with evidence of portal hypertension and developing esophageal varices.  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  Reactive free fluid within the right abdomen and pelvis.  These results were called by telephone at the  time of interpretation on 08/03/2014 at 3:47 pm to Dr. Randol Kern, who verbally acknowledged these results.  Signed,  Yvone Neu. Loreta Ave, DO  [facility]  [facility]   Electronically Signed   By: Gilmer Mor D.O.   On: 08/03/2014 15:48   Dg Abd Acute W/chest  08/05/2014   CLINICAL DATA:  Abdominal distention anemia  EXAM: DG ABDOMEN ACUTE W/ 1V CHEST  COMPARISON:  X-ray abdomen 08/04/2014  FINDINGS: NG tube extends the stomach. There is central venous pulmonary congestion. No free air beneath hemidiaphragms.  Gas distended loops of large and small bowel. Loops of small bowel are mildly dilated 3.6 cm. Loops colon within normal limits. There is gas rectum. On the decubitus view, there are air-fluid levels within the bowel. No intraperitoneal free air.  IMPRESSION: Gas-filled loops of large and small bowel with minimal distention and air-fluid levels suggesting  ileus. No intraperitoneal free air.   Electronically Signed   By: Genevive Bi M.D.   On: 08/05/2014 08:45   US Abdomen Limited Ruq  08/03/2014   CLINICAL DATA:  GI bleeding, elevated LFTs, hypertension, ethanol abuse, obesity  EXAM: US ABDOMEN LIMITED - RIGHT UPPER QUADRANT  COMPARISON:  08/02/2014  FINDINGS: Gallbladder:  Distended without stones. Patient sedated, unable to adequately assess for presence of a sonographic Murphy sign. Gallbladder wall appears mildly thickened. No pericholecystic fluid.  Common bile duct:  Diameter: Suboptimally visualized, only a short segment at the porta hepatis seen, 5 mm in diameter, normal  Liver:  Heterogeneous in echogenicity and slightly nodular in appearance suggesting cirrhosis. No discrete hepatic mass or intrahepatic biliary dilatation. No flow was detected in the main portal or LEFT/RIGHT portal veins on color Doppler imaging, though though uncertain if this represents thrombosis, portal hypertension, or technical artifact.  No RIGHT upper quadrant free fluid.  IMPRESSION: Cirrhotic appearing liver.  Unable to detect flow within the portal vein though this could be related to technical limitations of exam; a subsequent CT abdomen exam (please refer to that report) confirms patency of the portal vein.  Nonspecific thickening of gallbladder wall without visualization of calculi.  Cannot exclude acute cholecystitis with this appearance; consider radionuclide hepatobiliary imaging to assess patency of the cystic duct and exclude acute cholecystitis.   Electronically Signed   By: Ulyses Southward M.D.   On: 08/03/2014 15:14    Scheduled Meds: . atorvastatin  40 mg Oral q morning - 10a  . cefTRIAXone (ROCEPHIN)  IV  1 g Intravenous Q24H  . folic acid  1 mg Oral Daily  . lactulose  45 g Oral TID  . lactulose  300 mL Rectal BID  . metoprolol  5 mg Intravenous 3 times per day  . multivitamin with minerals  1 tablet Oral Daily  . [START ON 08/06/2014] pantoprazole  (PROTONIX) IV  40 mg Intravenous Q12H  . potassium chloride  10 mEq Intravenous Q1 Hr x 5  . rifaximin  550 mg Per Tube BID  . sodium chloride  10-40 mL Intracatheter Q12H  . sodium chloride  3 mL Intravenous Q12H  . thiamine  100 mg Oral Daily   Or  . thiamine  100 mg Intravenous Daily   Continuous Infusions: . sodium chloride 20 mL/hr at 08/05/14 0700  . dextrose 5 % 1,000 mL with potassium chloride 40 mEq infusion 75 mL/hr at 08/05/14 0830  . octreotide  (SANDOSTATIN)    IV infusion 50 mcg/hr (08/05/14 0830)  . pantoprozole (PROTONIX) infusion 8 mg/hr (  08/05/14 0830)    Principal Problem:   GI bleed Active Problems:   Acute encephalopathy   Encephalopathy, hepatic   Hypertension   Hyperlipidemia   Cardiac arrest   Rectal bleeding   Alcohol abuse   Chronic diastolic CHF (congestive heart failure)   GIB (gastrointestinal bleeding)   Abdominal pain   Hepatic cirrhosis   Acute blood loss anemia   Adynamic ileus   Hypernatremia    Time spent: 40 mins    [facility] MD [facility] Pager [phone]. If 7PM-7AM, please contact night-coverage at [email or web address], password [facility] 08/05/2014, 9:57 AM  LOS: 2 days

## 2014-08-05 NOTE — Progress Notes (Signed)
Date:  August 05, 2014 U.R. performed for needs and level of care. Gi bleeding with confusion, lactulose enemas Will continue to follow for Case Management needs.  Marcelle Smiling, RN, BSN, [place]   [phone]

## 2014-08-05 NOTE — Progress Notes (Signed)
PT Cancellation Note  Patient Details Name: Keith [name] MRN: [id number] DOB: July 30, 1940   Cancelled Treatment:    Reason Eval/Treat Not Completed: Patient not medically ready   Rebeca Alert, MPT Pager: [phone]

## 2014-08-06 ENCOUNTER — Inpatient Hospital Stay (HOSPITAL_COMMUNITY): Payer: Medicare Other

## 2014-08-06 LAB — CBC
HCT: 29.6 % — ABNORMAL LOW (ref 39.0–52.0)
Hemoglobin: 9.6 g/dL — ABNORMAL LOW (ref 13.0–17.0)
MCH: 32.7 pg (ref 26.0–34.0)
MCHC: 32.4 g/dL (ref 30.0–36.0)
MCV: 100.7 fL — ABNORMAL HIGH (ref 78.0–100.0)
PLATELETS: 205 10*3/uL (ref 150–400)
RBC: 2.94 MIL/uL — AB (ref 4.22–5.81)
RDW: 18.8 % — ABNORMAL HIGH (ref 11.5–15.5)
WBC: 11.6 10*3/uL — ABNORMAL HIGH (ref 4.0–10.5)

## 2014-08-06 LAB — COMPREHENSIVE METABOLIC PANEL
ALBUMIN: 2.8 g/dL — AB (ref 3.5–5.0)
ALK PHOS: 109 U/L (ref 38–126)
ALT: 44 U/L (ref 17–63)
ANION GAP: 5 (ref 5–15)
AST: 97 U/L — AB (ref 15–41)
BUN: 14 mg/dL (ref 6–20)
CHLORIDE: 113 mmol/L — AB (ref 101–111)
CO2: 24 mmol/L (ref 22–32)
CREATININE: 0.68 mg/dL (ref 0.61–1.24)
Calcium: 8.2 mg/dL — ABNORMAL LOW (ref 8.9–10.3)
GFR calc Af Amer: >60 mL/min (ref >60)
GFR calc non Af Amer: >60 mL/min (ref >60)
Glucose, Bld: 145 mg/dL — ABNORMAL HIGH (ref 65–99)
POTASSIUM: 3.6 mmol/L (ref 3.5–5.1)
Sodium: 142 mmol/L (ref 135–145)
Total Bilirubin: 2.3 mg/dL — ABNORMAL HIGH (ref 0.3–1.2)
Total Protein: 6.6 g/dL (ref 6.5–8.1)

## 2014-08-06 LAB — AMMONIA: Ammonia: 54 umol/L — ABNORMAL HIGH (ref 9–35)

## 2014-08-06 LAB — URINE CULTURE: Culture: NO GROWTH

## 2014-08-06 LAB — MAGNESIUM: MAGNESIUM: 2.1 mg/dL (ref 1.7–2.4)

## 2014-08-06 MED ORDER — MORPHINE SULFATE 2 MG/ML IJ SOLN
1.0000 mg | Freq: Once | INTRAMUSCULAR | Status: AC
  Administered 2014-08-07: 1 mg via INTRAVENOUS

## 2014-08-06 MED ORDER — LORAZEPAM 2 MG/ML IJ SOLN
1.0000 mg | Freq: Four times a day (QID) | INTRAMUSCULAR | Status: DC | PRN
Start: 2014-08-06 — End: 2014-08-07
  Administered 2014-08-07: 1 mg via INTRAVENOUS
  Filled 2014-08-06: qty 1

## 2014-08-06 MED ORDER — LACTULOSE 10 GM/15ML PO SOLN
45.0000 g | Freq: Three times a day (TID) | ORAL | Status: DC
  Administered 2014-08-06 – 2014-08-07 (×4): 45 g
  Filled 2014-08-06 (×9): qty 90

## 2014-08-06 MED ORDER — MORPHINE SULFATE 2 MG/ML IJ SOLN
0.5000 mg | INTRAMUSCULAR | Status: DC | PRN
  Administered 2014-08-06 – 2014-08-08 (×5): 0.5 mg via INTRAVENOUS
  Filled 2014-08-06 (×6): qty 1

## 2014-08-06 MED ORDER — POTASSIUM CHLORIDE 10 MEQ/100ML IV SOLN
10.0000 meq | INTRAVENOUS | Status: AC
  Administered 2014-08-06 (×5): 10 meq via INTRAVENOUS
  Filled 2014-08-06 (×2): qty 100

## 2014-08-06 MED ORDER — LORAZEPAM 1 MG PO TABS
1.0000 mg | ORAL_TABLET | Freq: Four times a day (QID) | ORAL | Status: DC | PRN
Start: 2014-08-06 — End: 2014-08-07
  Administered 2014-08-07: 1 mg via ORAL
  Filled 2014-08-06: qty 1

## 2014-08-06 NOTE — Progress Notes (Signed)
[facility] PROGRESS NOTE  Keith [name] ZOX:[id number] DOB: Oct 07, 1940 DOA: 08/03/2014 PCP: Georgann Housekeeper, MD  Assessment/Plan: #1 GI bleed Upper GI likely versus lower GI bleed. CT abdomen and pelvis with inflammation in the third portion of the duodenum concerning for duodenal ulcer and also small developing esophageal varices with portal hypertension and changes of cirrhosis noted. Patient currently with an acute encephalopathy that seems to be slowly improving as he opens his eyes to verbal stimuli however just back off to sleep. EGD was canceled. Continue IV PPI and d/c IV octreotide per GI recommendations. Xifaxan has been added per GI. Patient status post 2 units packed red blood cells hemoglobin currently at 10.3 from 8.9 from 7.2 on admission. Patient's hemoglobin was 12.1 on 05/13/2013. Follow H&H. Per GI.  #2 acute blood loss anemia Secondary to PROM #1. Patient status post 2 units of packed red blood cells. Hemoglobin currently at 9.6 from 10.3 from 8.9 from 7.2 on admission. Hemoglobin was 12.1 on 05/13/2013.  #3 acute encephalopathy/hepatic encephalopathy Likely secondary to hepatic encephalopathy as patient unable to get lactulose secondary to altered mental status. Slight improvement with mental status as patient opens eyes to verbal stimuli however drifts back off to sleep. Per family patient was able to interact with them yesterday however patient with significant agitation this morning and was given some Ativan and morphine. CT head negative for any acute abnormalities. Ammonia level was elevated at 132 yesterday and is trending down currently at 54 today. Continue NG tube. Continue lactulose enemas and Xifaxan. Decrease dose of Ativan and morphine. GI following.  #4 intermittent abdominal pain Appears to be chronic in nature. Patient on empiric IV Rocephin until his BPs ruled out. CT abdomen and pelvis with inflammation noted in the third portion of the duodenum  concerning for duodenal ulcer and also diverticulosis with small developing esophageal varices and portal hypertension with changes of cirrhosis noted. Follow.  #5 ileus Questionable etiology. Patient has NGT to low intermittent suction. Patient with some bowel sounds however abdomen distended. Keep potassium greater than 4. Keep magnesium greater than 2. Follow.  #6 cirrhosis Per CT of the abdomen and pelvis. Continue lactulose enemas twice a day and lactulose via NG tube. Continue Xifaxan. Will likely need follow-up with GI as outpatient. Once patient is alert or likely need to resume his home regimen of Lasix and spironolactone. Follow for now.  #7 chronic diastolic CHF Stable. 2-D echo from May 2015 with a EF of 60-65% with grade 1 diastolic dysfunction. Lasix and Aldactone on hold for now secondary to problem #1. Follow.  #8 hypertension Diuretics on hold. Continue beta blocker.  #9 alcohol abuse Continue the Ativan withdrawal protocol.  #10 hypernatremia Continue D5W2 at 100 mL per hour. Follow.  #11 prophylaxis PPI for GI prophylaxis. SCDs for DVT prophylaxis.  Code Status: Full Family Communication: Updated daughter and ex-wife at bedside.  Disposition Plan: Remain the step down unit.   Consultants:  Gastroenterology: Dr. Christella Hartigan 08/03/2014  Procedures:  CT head 08/03/14  CT abdomen and pelvis 08/03/2014  Abdominal ultrasound 08/03/2014  2 units packed red blood cells 08/03/2014  PICC line 08/04/2014  Antibiotics:  IV Rocephin 08/03/2014>>>>>> 08/06/2014  HPI/Subjective: Per daughter patient was more alert yesterday and was interacting with the family. Patient noted this morning to be very agitated and trying to climb out of bed and a such patient was given some morphine and Ativan. Patient now opens eyes to verbal stimuli and drifts back to sleep.  Objective: Filed  Vitals:   08/06/14 0811  BP: 184/93  Pulse: 108  Temp: 97.7 F (36.5 C)  Resp: 26     Intake/Output Summary (Last 24 hours) at 08/06/14 1011 Last data filed at 08/06/14 0700  Gross per 24 hour  Intake   3800 ml  Output   1430 ml  Net   2370 ml   Filed Weights   08/03/14 0800 08/04/14 0400  Weight: 93 kg (205 lb 0.4 oz) 94.9 kg (209 lb 3.5 oz)    Exam:   General:  Opens eyes to verbal stimuli however drifts back off to sleep.  Cardiovascular: RRR  Respiratory: CTAB anterior lung fields  Abdomen: Soft, nontender, nondistended, hypoactive bowel sounds.  Musculoskeletal: No clubbing cyanosis, edema  Data Reviewed: Basic Metabolic Panel:  Recent Labs Lab 08/03/14 0339 08/03/14 0351 08/04/14 0353 08/04/14 1620 08/05/14 0620 08/06/14 0500  NA 140 143 145 148* 148* 142  K 4.1 4.1 3.7 3.6 3.5 3.6  CL 107 106 115* 114* 112* 113*  CO2 24  --  GLUCOSE 129* 127* 158* 129* 161* 145*  BUN 21* 24* 21* CREATININE 0.56* 0.50* 0.82 0.56* 0.68 0.68  CALCIUM 9.0  --  8.3* 8.8* 7.7* 8.2*  MG  --   --   --  2.1 2.1 2.1   Liver Function Tests:  Recent Labs Lab 08/03/14 1730 08/04/14 0353 08/05/14 0620 08/06/14 0500  AST 84* 90* 99* 97*  ALT 34 36 42 44  ALKPHOS 91 90 95 109  BILITOT 2.3* 2.5* 2.3* 2.3*  PROT 6.7 6.5 6.9 6.6  ALBUMIN 2.9* 2.7* 3.0* 2.8*    Recent Labs Lab 08/03/14 1730  LIPASE 24    Recent Labs Lab 08/03/14 0605 08/04/14 0726 08/05/14 0620 08/06/14 0530  AMMONIA 122* 132* 71* 54*   CBC:  Recent Labs Lab 08/03/14 0339  08/03/14 2315 08/04/14 0353 08/04/14 1507 08/05/14 0620 08/06/14 0500  WBC 8.9  --   --  8.7  --  12.3* 11.6*  HGB 7.2*  < > 9.5* 8.9* 10.0* 10.3* 9.6*  HCT 21.7*  < > 28.1* 26.6* 30.0* 31.4* 29.6*  MCV 103.3*  --   --  94.3  --  98.1 100.7*  PLT 262  --   --  178  --  237 205  < > = values in this interval not displayed. Cardiac Enzymes: No results for input(s): CKTOTAL, CKMB, CKMBINDEX, TROPONINI in the last 168 hours. BNP (last 3 results)  Recent Labs  08/03/14 1730   BNP 35.6    ProBNP (last 3 results) No results for input(s): PROBNP in the last 8760 hours.  CBG:  Recent Labs Lab 08/03/14 0341 08/03/14 1103 08/05/14 0829  GLUCAP 117* 96 128*    Recent Results (from the past 240 hour(s))  MRSA PCR Screening     Status: None   Collection Time: 08/03/14  8:04 AM  Result Value Ref Range Status   MRSA by PCR NEGATIVE NEGATIVE Final    Comment:        The GeneXpert MRSA Assay (FDA approved for NASAL specimens only), is one component of a comprehensive MRSA colonization surveillance program. It is not intended to diagnose MRSA infection nor to guide or monitor treatment for MRSA infections.   Urine culture     Status: None   Collection Time: 08/04/14  1:08 PM  Result Value Ref Range Status   Specimen Description URINE, CATHETERIZED  Final   Special  Requests NONE  Final   Culture   Final    NO GROWTH 2 DAYS Performed at [facility]    Report Status 08/06/2014 FINAL  Final     Studies: Dg Abd 1 View  08/04/2014   CLINICAL DATA:  Abdominal distension  EXAM: ABDOMEN - 1 VIEW  COMPARISON:  CT scan 08/03/2014  FINDINGS: Air-filled small bowel and colon with mild distention suggesting a diffuse ileus. No obvious free air. There is an NG tube in the stomach.  IMPRESSION: Diffuse ileus bowel gas pattern.   Electronically Signed   By: Rudie Meyer M.D.   On: 08/04/2014 15:41   Dg Abd Acute W/chest  08/05/2014   CLINICAL DATA:  Abdominal distention anemia  EXAM: DG ABDOMEN ACUTE W/ 1V CHEST  COMPARISON:  X-ray abdomen 08/04/2014  FINDINGS: NG tube extends the stomach. There is central venous pulmonary congestion. No free air beneath hemidiaphragms.  Gas distended loops of large and small bowel. Loops of small bowel are mildly dilated 3.6 cm. Loops colon within normal limits. There is gas rectum. On the decubitus view, there are air-fluid levels within the bowel. No intraperitoneal free air.  IMPRESSION: Gas-filled loops of large and  small bowel with minimal distention and air-fluid levels suggesting ileus. No intraperitoneal free air.   Electronically Signed   By: Genevive Bi M.D.   On: 08/05/2014 08:45    Scheduled Meds: . atorvastatin  40 mg Oral q morning - 10a  . cefTRIAXone (ROCEPHIN)  IV  1 g Intravenous Q24H  . folic acid  1 mg Oral Daily  . lactulose  45 g Per Tube TID  . lactulose  300 mL Rectal BID  . metoprolol  5 mg Intravenous 3 times per day  . multivitamin with minerals  1 tablet Oral Daily  . pantoprazole (PROTONIX) IV  40 mg Intravenous Q12H  . rifaximin  550 mg Per Tube BID  . sodium chloride  10-40 mL Intracatheter Q12H  . sodium chloride  3 mL Intravenous Q12H  . thiamine  100 mg Oral Daily   Or  . thiamine  100 mg Intravenous Daily   Continuous Infusions: . sodium chloride Stopped (08/05/14 0800)  . dextrose 5 % 1,000 mL with potassium chloride 40 mEq infusion 100 mL/hr at 08/06/14 0041  . octreotide  (SANDOSTATIN)    IV infusion 50 mcg/hr (08/06/14 0700)    Principal Problem:   GI bleed Active Problems:   Acute encephalopathy   Encephalopathy, hepatic   Hypertension   Hyperlipidemia   Cardiac arrest   Rectal bleeding   Alcohol abuse   Chronic diastolic CHF (congestive heart failure)   GIB (gastrointestinal bleeding)   Abdominal pain   Hepatic cirrhosis   Acute blood loss anemia   Adynamic ileus   Hypernatremia    Time spent: 40 mins    [facility] MD [facility] Pager [phone]. If 7PM-7AM, please contact night-coverage at [email or web address], password [facility] 08/06/2014, 10:11 AM  LOS: 3 days

## 2014-08-06 NOTE — Progress Notes (Signed)
OT Cancellation Note  Patient Details Name: Keith [name] MRN: [id number] DOB: 1940/11/24   Cancelled Treatment:    Reason Eval/Treat Not Completed: Other (comment) Spoke with PT who had checked with nursing and pt had had ativan and not following commands. OT will try back another time.  Lennox Laity  [phone] 08/06/2014, 1:34 PM

## 2014-08-06 NOTE — Clinical Documentation Improvement (Signed)
Query #1 SBP,  treating empirically with Rocephin is documented in the current medical record.  Please clarify and document, in the progress notes and discharge summary, if the diagnosis of SBP has been:   - Confirmed and present on admission   - Ruled Out and not applicable to this admission  - Remains neither ruled out nor confirmed - SBP remains a probable, likely or suspected diagnosis   Thank You, Jerral Ralph ,RN Clinical Documentation Specialist:  [phone] [facility]- Health Information Management

## 2014-08-06 NOTE — Progress Notes (Signed)
Ammonia [[id number]] (Abnormal) Collected: 08/06/14 0530     Ammonia 54 (H) umol/L Updated: 08/06/14 5750   Date:  August 06, 2014 U.R. performed for needs and level of care. Will continue to follow for Case Management needs.  Marcelle Smiling, RN, BSN, [place]   [phone]

## 2014-08-06 NOTE — Progress Notes (Signed)
   [facility] Progress Note  Subjective:   Hgb stable. Ammonia 54. Received ativan this morning, now sleeping. Daughter says he woke up yesterday and knew he was in a bed. Recognized daughter.   Objective:  Vital signs in last 24 hours: Temp:  [97.7 F (36.5 C)-99 F (37.2 C)] 97.7 F (36.5 C) (06/17 0811) Pulse Rate:  [82-110] 108 (06/17 0811) Resp:  [16-36] 26 (06/17 0811) BP: (124-184)/(63-94) 184/93 mmHg (06/17 0811) SpO2:  [95 %-100 %] 95 % (06/17 0811) Last BM Date: 08/06/14 (enema) General:  somnolent Heart:  Regular rate and rhythm; no murmurs Pulm;lungs clear Abdomen:  Distended, quiet bowel sounds, without guarding, and without rebound.   Extremities:  2+ edema   Intake/Output from previous day: 06/16 0701 - 06/17 0700 In: 5058.3 [I.V.:4178.3; NG/GT:330; IV Piggyback:550] Out: 1600 [Urine:1450; Emesis/NG output:150] Intake/Output this shift:    Lab Results:  Recent Labs  08/04/14 0353 08/04/14 1507 08/05/14 0620 08/06/14 0500  WBC 8.7  --  12.3* 11.6*  HGB 8.9* 10.0* 10.3* 9.6*  HCT 26.6* 30.0* 31.4* 29.6*  PLT 178  --  237 205   BMET  Recent Labs  08/04/14 1620 08/05/14 0620 08/06/14 0500  NA 148* 148* 142  K 3.6 3.5 3.6  CL 114* 112* 113*  CO2 24 22 24  GLUCOSE 129* 161* 145*  BUN 20 18 14  CREATININE 0.56* 0.68 0.68  CALCIUM 8.8* 7.7* 8.2*   LFT  Recent Labs  08/03/14 1730  08/06/14 0500  PROT 6.7  < > 6.6  ALBUMIN 2.9*  < > 2.8*  AST 84*  < > 97*  ALT 34  < > 44  ALKPHOS 91  < > 109  BILITOT 2.3*  < > 2.3*  BILIDIR 0.9*  --   --   IBILI 1.4*  --   --   < > = values in this interval not displayed. PT/INR  Recent Labs  08/05/14 0620  LABPROT 17.9*  INR 1.46   Hepatitis Panel  Recent Labs  08/03/14 1730  HEPBSAG Negative  HCVAB 0.3    Dg Abd 1 View  08/04/2014   CLINICAL DATA:  Abdominal distension  EXAM: ABDOMEN - 1 VIEW  COMPARISON:  CT scan 08/03/2014  FINDINGS: Air-filled small bowel and  colon with mild distention suggesting a diffuse ileus. No obvious free air. There is an NG tube in the stomach.  IMPRESSION: Diffuse ileus bowel gas pattern.   Electronically Signed   By: P.  Gallerani M.D.   On: 08/04/2014 15:41   Dg Abd Acute W/chest  08/05/2014   CLINICAL DATA:  Abdominal distention anemia  EXAM: DG ABDOMEN ACUTE W/ 1V CHEST  COMPARISON:  X-ray abdomen 08/04/2014  FINDINGS: NG tube extends the stomach. There is central venous pulmonary congestion. No free air beneath hemidiaphragms.  Gas distended loops of large and small bowel. Loops of small bowel are mildly dilated 3.6 cm. Loops colon within normal limits. There is gas rectum. On the decubitus view, there are air-fluid levels within the bowel. No intraperitoneal free air.  IMPRESSION: Gas-filled loops of large and small bowel with minimal distention and air-fluid levels suggesting ileus. No intraperitoneal free air.   Electronically Signed   By: Stewart  Edmunds M.D.   On: 08/05/2014 08:45    ASSESSMENT/PLAN:     74 y.o. male with etoh cirrhosis, etoh hepatitis, GI bleed, currently obtunded.Hgb stable. No evidence of further bleed. Still with AMS--not safe for endoscopy unless intubated. Cont PPI, octreotide,   lactulose, xifaxan.      Will repeat abd film to see if there is further distention.     LOS: 3 days   Hvozdovic, Lori P PA-C 08/06/2014, Pager [phone]  ________________________________________________________________________  [facility] MD note:  I personally examined the patient, reviewed the data and agree with the assessment and plan described above. Need to clear his encephalopathy before any further testing, procedures. Continue NG lactulose, enema lactulose, xifaxan. We will return to see him on Monday.  I am on call this weekend, please call or page with any questions or concerns prior to then.  He has had NO overt bleeding in 3-4 days and so I think it is safe to stop his octreotide gtts at this point.   Should continue IV PPI but I think twice daily 40mg is safe, can d/c his PPI drip.     Daniel Jacobs, MD [facility] Pager [phone]  

## 2014-08-06 NOTE — Progress Notes (Signed)
PT Cancellation Note  Patient Details Name: Keith [name] MRN: [id number] DOB: December 19, 1940   Cancelled Treatment:    Reason Eval/Treat Not Completed: Spoke with RN and family. Per RN, pt not really following commands. Daughter reports pt given ativan about an hour prior to my arrival. Will check back as schedule permits-may be another day. Thanks.    Rebeca Alert, MPT Pager: [phone]

## 2014-08-07 ENCOUNTER — Inpatient Hospital Stay (HOSPITAL_COMMUNITY): Payer: Medicare Other

## 2014-08-07 ENCOUNTER — Encounter (HOSPITAL_COMMUNITY): Payer: Self-pay

## 2014-08-07 ENCOUNTER — Encounter (HOSPITAL_COMMUNITY)
Admission: EM | Disposition: A | Discharge status: Home Health | Payer: Self-pay | Source: Home / Self Care | Attending: Internal Medicine

## 2014-08-07 DIAGNOSIS — I8510 Secondary esophageal varices without bleeding: Secondary | ICD-10-CM

## 2014-08-07 DIAGNOSIS — D72829 Elevated white blood cell count, unspecified: Secondary | ICD-10-CM

## 2014-08-07 DIAGNOSIS — I8501 Esophageal varices with bleeding: Secondary | ICD-10-CM

## 2014-08-07 DIAGNOSIS — K7030 Alcoholic cirrhosis of liver without ascites: Secondary | ICD-10-CM

## 2014-08-07 DIAGNOSIS — D62 Acute posthemorrhagic anemia: Secondary | ICD-10-CM

## 2014-08-07 HISTORY — PX: ESOPHAGOGASTRODUODENOSCOPY: SHX5428

## 2014-08-07 LAB — COMPREHENSIVE METABOLIC PANEL
ALT: 39 U/L (ref 17–63)
ANION GAP: 5 (ref 5–15)
AST: 97 U/L — ABNORMAL HIGH (ref 15–41)
Albumin: 2.6 g/dL — ABNORMAL LOW (ref 3.5–5.0)
Alkaline Phosphatase: 93 U/L (ref 38–126)
BUN: 10 mg/dL (ref 6–20)
CO2: 24 mmol/L (ref 22–32)
Calcium: 7.9 mg/dL — ABNORMAL LOW (ref 8.9–10.3)
Chloride: 111 mmol/L (ref 101–111)
Creatinine, Ser: 0.49 mg/dL — ABNORMAL LOW (ref 0.61–1.24)
GFR calc Af Amer: >60 mL/min (ref >60)
GFR calc non Af Amer: >60 mL/min (ref >60)
GLUCOSE: 135 mg/dL — AB (ref 65–99)
POTASSIUM: 4.3 mmol/L (ref 3.5–5.1)
Sodium: 140 mmol/L (ref 135–145)
Total Bilirubin: 2.8 mg/dL — ABNORMAL HIGH (ref 0.3–1.2)
Total Protein: 6.2 g/dL — ABNORMAL LOW (ref 6.5–8.1)

## 2014-08-07 LAB — CBC
HCT: 27.1 % — ABNORMAL LOW (ref 39.0–52.0)
Hemoglobin: 8.8 g/dL — ABNORMAL LOW (ref 13.0–17.0)
MCH: 33 pg (ref 26.0–34.0)
MCHC: 32.5 g/dL (ref 30.0–36.0)
MCV: 101.5 fL — ABNORMAL HIGH (ref 78.0–100.0)
Platelets: 213 10*3/uL (ref 150–400)
RBC: 2.67 MIL/uL — AB (ref 4.22–5.81)
RDW: 18.2 % — ABNORMAL HIGH (ref 11.5–15.5)
WBC: 11.7 10*3/uL — ABNORMAL HIGH (ref 4.0–10.5)

## 2014-08-07 LAB — MAGNESIUM: MAGNESIUM: 2 mg/dL (ref 1.7–2.4)

## 2014-08-07 LAB — AMMONIA: Ammonia: 54 umol/L — ABNORMAL HIGH (ref 9–35)

## 2014-08-07 SURGERY — EGD (ESOPHAGOGASTRODUODENOSCOPY)
Anesthesia: Moderate Sedation

## 2014-08-07 MED ORDER — FENTANYL CITRATE (PF) 100 MCG/2ML IJ SOLN
INTRAMUSCULAR | Status: DC | PRN
  Administered 2014-08-07: 25 ug via INTRAVENOUS

## 2014-08-07 MED ORDER — SODIUM CHLORIDE 0.9 % IV SOLN
8.0000 mg/h | INTRAVENOUS | Status: DC
  Administered 2014-08-07 – 2014-08-08 (×4): 8 mg/h via INTRAVENOUS
  Filled 2014-08-07 (×7): qty 80

## 2014-08-07 MED ORDER — CETYLPYRIDINIUM CHLORIDE 0.05 % MT LIQD
7.0000 mL | Freq: Two times a day (BID) | OROMUCOSAL | Status: DC
  Administered 2014-08-07 – 2014-08-10 (×8): 7 mL via OROMUCOSAL

## 2014-08-07 MED ORDER — SODIUM CHLORIDE 0.9 % IV SOLN
50.0000 ug/h | INTRAVENOUS | Status: DC
  Administered 2014-08-07 – 2014-08-11 (×9): 50 ug/h via INTRAVENOUS
  Filled 2014-08-07 (×20): qty 1

## 2014-08-07 MED ORDER — MORPHINE SULFATE 2 MG/ML IJ SOLN
1.0000 mg | Freq: Once | INTRAMUSCULAR | Status: AC
  Administered 2014-08-07: 1 mg via INTRAVENOUS
  Filled 2014-08-07: qty 1

## 2014-08-07 MED ORDER — LORAZEPAM 2 MG/ML IJ SOLN
0.5000 mg | Freq: Four times a day (QID) | INTRAMUSCULAR | Status: AC | PRN
  Administered 2014-08-08: 0.5 mg via INTRAVENOUS
  Filled 2014-08-07: qty 1

## 2014-08-07 MED ORDER — FENTANYL CITRATE (PF) 100 MCG/2ML IJ SOLN
INTRAMUSCULAR | Status: AC
  Filled 2014-08-07: qty 2

## 2014-08-07 MED ORDER — MIDAZOLAM HCL 5 MG/ML IJ SOLN
INTRAMUSCULAR | Status: AC
  Filled 2014-08-07: qty 2

## 2014-08-07 MED ORDER — LORAZEPAM 0.5 MG PO TABS: 0.5000 mg | ORAL_TABLET | Freq: Four times a day (QID) | ORAL | Status: AC | PRN

## 2014-08-07 MED ORDER — ONDANSETRON HCL 4 MG/2ML IJ SOLN
4.0000 mg | Freq: Four times a day (QID) | INTRAMUSCULAR | Status: DC | PRN
  Administered 2014-08-07 (×2): 4 mg via INTRAVENOUS
  Filled 2014-08-07 (×2): qty 2

## 2014-08-07 MED ORDER — MIDAZOLAM HCL 10 MG/2ML IJ SOLN
INTRAMUSCULAR | Status: DC | PRN
  Administered 2014-08-07: 2 mg via INTRAVENOUS
  Administered 2014-08-07: 1 mg via INTRAVENOUS

## 2014-08-07 NOTE — Progress Notes (Signed)
Late Entry : Noted at 2:40 am that patient had bright red blood present in NGT. Pt vitals were stable at the time although he was slightly hypertensive and tachycardiac.  Pt responsiveness was unchanged.  Elray Mcgregor NP with house coverage was notified and orders were received.  Resumed Octreotide drip and added pain med and anti emetic.  Labs were drawn early in am and also, Dr. Christella Hartigan with Gastroenterology was also notified as he had consulted on the patient prior.  Dr. Christella Hartigan then called later on to request patient to get ready to perform EGD.   At 5:00 EGD was completed with the GI team.  Patient tolerated procedure well.  NG removed.  Pt resting quietly after.

## 2014-08-07 NOTE — Progress Notes (Signed)
PT Cancellation Note  Patient Details Name: Keith [name] MRN: [id number] DOB: 1941/02/13   Cancelled Treatment:    Reason Eval/Treat Not Completed: Other (comment);Patient at procedure or test/unavailable; Pt just back from EGD, will attempt again next day or as schedule permits   [facility] 08/07/2014, 9:21 AM

## 2014-08-07 NOTE — Interval H&P Note (Signed)
History and Physical Interval Note:  08/07/2014 6:29 AM  Keith [name]  has presented today for surgery, with the diagnosis of bleeding  The various methods of treatment have been discussed with the patient and family. After consideration of risks, benefits and other options for treatment, the patient has consented to  Procedure(s): ESOPHAGOGASTRODUODENOSCOPY (EGD) (N/A) as a surgical intervention .  The patient's history has been reviewed, patient examined, no change in status, stable for surgery.  I have reviewed the patient's chart and labs.  Questions were answered to the patient's satisfaction.     Rachael Fee

## 2014-08-07 NOTE — Op Note (Signed)
[facility] [address] [place], [address]   ENDOSCOPY PROCEDURE REPORT  PATIENT: Keith [name], Keith [name]  MR#: [id number] BIRTHDATE: August 28, 1940 , 73  yrs. old GENDER: male ENDOSCOPIST: Rachael Fee, MD PROCEDURE DATE:  08/07/2014 PROCEDURE:  EGD w/ band ligation of varices ASA CLASS:     Class IV INDICATIONS:  newly diagnosed etoh cirrhosis, UGI bleeding. MEDICATIONS: Fentanyl 25 mcg IV and Versed 3 mg IV TOPICAL ANESTHETIC: none  DESCRIPTION OF PROCEDURE: After the risks benefits and alternatives of the procedure were thoroughly explained, informed consent was obtained.  The PENTAX GASTOROSCOPE [id number] endoscope was introduced through the mouth and advanced to the second portion of the duodenum , Without limitations.  The instrument was slowly withdrawn as the mucosa was fully examined.  There were three trunks of large esopahgeal varices, one of which had red wale sign.  There was no active bleeding, however there was fairly fresh appearing red blood clot in the proximal stomach. There were no gastric varices.  There appeared to be some minor NG related suction trauma in proximal stomach.  There was a minor distal duodenal bulb benign appearing stricture that oozed blood after advancing the gastroscope through it.  I placed 7 variceal ligating bands on the esophageal varices in good position.  The examination was otherwise normal.  Retroflexed views revealed no abnormalities.     The scope was then withdrawn from the patient and the procedure completed.  COMPLICATIONS: There were no immediate complications.  ENDOSCOPIC IMPRESSION: There were three trunks of large esopahgeal varices, one of which had red wale sign.  There was no active bleeding, however there was fairly fresh appearing red blood clot in the proximal stomach. There were no gastric varices.  There appeared to be some minor NG related suction trauma in proximal stomach.  There was a  minor distal duodenal bulb benign appearing stricture that oozed blood after advancing the gastroscope through it.  I placed 7 variceal ligating bands on the esophageal varices in good position.  The examination was otherwise normal  RECOMMENDATIONS: Continue octreotide and protonix gtts another 24-28 hours.  OK for him to have sips water with meds and ice chips today.  His mental status has significantly improved over past 24 hours and I would like to continue oral lactulose, rectal lactulose and xifaxan for now.  He will eventually (3-4 weeks) need repeat EGD, band ligation.  Will likely also start non-selective B blocker as he recovers from this acute illness.  I discussed all this with his daughter following the procedure.  eSigned:  Rachael Fee, MD 08/07/2014 7:16 AM    CC:

## 2014-08-07 NOTE — H&P (View-Only) (Signed)
[name] Progress [facility]  Subjective:   Hgb stable. Ammonia 54. Received ativan this morning, now sleeping. Daughter says he woke up yesterday and knew he was in a bed. Recognized daughter.   Objective:  Vital signs in last 24 hours: Temp:  [97.7 F (36.5 C)-99 F (37.2 C)] 97.7 F (36.5 C) (06/17 0811) Pulse Rate:  [82-110] 108 (06/17 0811) Resp:  [16-36] 26 (06/17 0811) BP: (124-184)/(63-94) 184/93 mmHg (06/17 0811) SpO2:  [95 %-100 %] 95 % (06/17 0811) Last BM Date: 08/06/14 (enema) General:  somnolent Heart:  Regular rate and rhythm; no murmurs Pulm;lungs clear Abdomen:  Distended, quiet bowel sounds, without guarding, and without rebound.   Extremities:  2+ edema   Intake/Output from previous day: 06/16 0701 - 06/17 0700 In: 5058.3 [I.V.:4178.3; NG/GT:330; IV Piggyback:550] Out: 1600 [Urine:1450; Emesis/NG output:150] Intake/Output this shift:    Lab Results:  Recent Labs  08/04/14 0353 08/04/14 1507 08/05/14 0620 08/06/14 0500  WBC 8.7  --  12.3* 11.6*  HGB 8.9* 10.0* 10.3* 9.6*  HCT 26.6* 30.0* 31.4* 29.6*  PLT 178  --  237 205   BMET  Recent Labs  08/04/14 1620 08/05/14 0620 08/06/14 0500  NA 148* 148* 142  K 3.6 3.5 3.6  CL 114* 112* 113*  CO2 GLUCOSE 129* 161* 145*  BUN CREATININE 0.56* 0.68 0.68  CALCIUM 8.8* 7.7* 8.2*   LFT  Recent Labs  08/03/14 1730  08/06/14 0500  PROT 6.7  < > 6.6  ALBUMIN 2.9*  < > 2.8*  AST 84*  < > 97*  ALT 34  < > 44  ALKPHOS 91  < > 109  BILITOT 2.3*  < > 2.3*  BILIDIR 0.9*  --   --   IBILI 1.4*  --   --   < > = values in this interval not displayed. PT/INR  Recent Labs  08/05/14 0620  LABPROT 17.9*  INR 1.46   Hepatitis Panel  Recent Labs  08/03/14 1730  HEPBSAG Negative  HCVAB 0.3    Dg Abd 1 View  08/04/2014   CLINICAL DATA:  Abdominal distension  EXAM: ABDOMEN - 1 VIEW  COMPARISON:  CT scan 08/03/2014  FINDINGS: Air-filled small bowel and  colon with mild distention suggesting a diffuse ileus. No obvious free air. There is an NG tube in the stomach.  IMPRESSION: Diffuse ileus bowel gas pattern.   Electronically Signed   By: Rudie Meyer M.D.   On: 08/04/2014 15:41   Dg Abd Acute W/chest  08/05/2014   CLINICAL DATA:  Abdominal distention anemia  EXAM: DG ABDOMEN ACUTE W/ 1V CHEST  COMPARISON:  X-ray abdomen 08/04/2014  FINDINGS: NG tube extends the stomach. There is central venous pulmonary congestion. No free air beneath hemidiaphragms.  Gas distended loops of large and small bowel. Loops of small bowel are mildly dilated 3.6 cm. Loops colon within normal limits. There is gas rectum. On the decubitus view, there are air-fluid levels within the bowel. No intraperitoneal free air.  IMPRESSION: Gas-filled loops of large and small bowel with minimal distention and air-fluid levels suggesting ileus. No intraperitoneal free air.   Electronically Signed   By: Genevive Bi M.D.   On: 08/05/2014 08:45    ASSESSMENT/PLAN:     74 y.o. male with etoh cirrhosis, etoh hepatitis, GI bleed, currently obtunded.Hgb stable. No evidence of further bleed. Still with AMS--not safe for endoscopy unless intubated. Cont PPI, octreotide,  lactulose, xifaxan.      Will repeat abd film to see if there is further distention.     LOS: 3 days   Keith [name], Keith Pizza PA-C 08/06/2014, Pager [phone]  ________________________________________________________________________  [facility] MD note:  I personally examined the patient, reviewed the data and agree with the assessment and plan described above. Need to clear his encephalopathy before any further testing, procedures. Continue NG lactulose, enema lactulose, xifaxan. We will return to see him on Monday.  I am on call this weekend, please call or page with any questions or concerns prior to then.  He has had NO overt bleeding in 3-4 days and so I think it is safe to stop his octreotide gtts at this point.   Should continue IV PPI but I think twice daily 40mg  is safe, can d/c his PPI drip.     Rob Bunting, MD [facility] Pager [phone]

## 2014-08-07 NOTE — Progress Notes (Signed)
[facility] PROGRESS NOTE  KENNEY LUBECK LAG:[id number] DOB: [birth year] DOA: 08/03/2014 PCP: Georgann Housekeeper, MD  Assessment/Plan: #1 GI bleed/large esophageal varices status post banding 7 per EGD 08/07/2014 Upper GI . Patient with some bleeding overnight and subsequently underwent upper endoscopy early this morning which showed large esophageal varices status post banding 7. Also noted was a distal blood non-bulb stricture with some bleeding after scope was passed through.  CT abdomen and pelvis with inflammation in the third portion of the duodenum concerning for duodenal ulcer and also small developing esophageal varices with portal hypertension and changes of cirrhosis noted. Patient currently with an acute encephalopathy that seems to be slowly improving as he opens his eyes to verbal stimuli. Patient status post EGD. Continue IV PPI and IV octreotide per GI recommendations. Xifaxan has been added per GI. Patient status post 2 units packed red blood cells hemoglobin currently at 8.8 from 10.3 from 8.9 from 7.2 on admission. Patient's hemoglobin was 12.1 on 05/13/2013. Patient will likely need repeat EGD in about 3-4 weeks for repeat banding per GI. Patient currently on IV Lopressor. Was tolerating oral intake will transition to a nonselective beta blocker. Follow H&H. Per GI.  #2 acute blood loss anemia Secondary to #1. Patient status post 2 units of packed red blood cells. Hemoglobin currently at 8.8 from 9.6 from 10.3 from 8.9 from 7.2 on admission. Hemoglobin was 12.1 on 05/13/2013.  #3 acute encephalopathy/hepatic encephalopathy Likely secondary to hepatic encephalopathy as patient unable to get lactulose secondary to altered mental status. Improvement with mental status as patient opens eyes to verbal stimuli. Per family patient was able to interact with them yesterday however patient with increased bloody output from NG tube overnight and subsequently underwent EGD today. CT head  negative for any acute abnormalities. Ammonia level was elevated at 132 and is trending down currently at 54 today. Continue NG tube. Continue oral lactulose and lactulose enemas and Xifaxan. Decreased dose of Ativan and morphine. GI following.  #4 intermittent abdominal pain Appears to be chronic in nature. Patient on empiric IV Rocephin for possible SBP. CT abdomen and pelvis with inflammation noted in the third portion of the duodenum concerning for duodenal ulcer and also diverticulosis with small developing esophageal varices and portal hypertension with changes of cirrhosis noted. Doubt if patient has SBP. Will discontinue IV Rocephin. Follow.  #5 ileus Questionable etiology. NGT has been removed. Patient with some bowel sounds. Keep potassium greater than 4. Keep magnesium greater than 2. Follow.  #6 cirrhosis Per CT of the abdomen and pelvis. Continue lactulose enemas twice a day and lactulose via NG tube. Continue Xifaxan. Will likely need follow-up with GI as outpatient. Once patient is more alert will likely need to resume his home regimen of Lasix and spironolactone. Follow for now.  #7 chronic diastolic CHF Stable. 2-D echo from May 2015 with a EF of 60-65% with grade 1 diastolic dysfunction. Lasix and Aldactone on hold for now secondary to problem #1. Follow.  #8 hypertension Diuretics on hold. Continue IV beta blocker.  #9 alcohol abuse Continue the Ativan withdrawal protocol.  #10 hypernatremia Decrease D5W to 75 mL per hour. Follow.  #11 prophylaxis PPI for GI prophylaxis. SCDs for DVT prophylaxis.  Code Status: Full Family Communication: Updated daughter and ex-wife at bedside.  Disposition Plan: Remain in step down unit.   Consultants:  Gastroenterology: Dr. Christella Hartigan 08/03/2014  Procedures:  CT head 08/03/14  CT abdomen and pelvis 08/03/2014  Abdominal ultrasound 08/03/2014  2 units  packed red blood cells 08/03/2014  PICC line 08/04/2014  Upper endoscopy  08/07/2014 per Dr. Christella Hartigan  Antibiotics:  IV Rocephin 08/03/2014>>>>>> 08/06/2014  HPI/Subjective: Per daughter patient noted to have bleeding overnight. Patient more alert today. Patient status post upper endoscopy early this morning with banding of esophageal varices 7.  Objective: Filed Vitals:   08/07/14 0900  BP: 136/68  Pulse: 80  Temp:   Resp: 12    Intake/Output Summary (Last 24 hours) at 08/07/14 0950 Last data filed at 08/07/14 0400  Gross per 24 hour  Intake 2134.17 ml  Output   1250 ml  Net 884.17 ml   Filed Weights   08/03/14 0800 08/04/14 0400  Weight: 93 kg (205 lb 0.4 oz) 94.9 kg (209 lb 3.5 oz)    Exam:   General:  Opens eyes to verbal stimuli, following commands.  Cardiovascular: RRR  Respiratory: CTAB anterior lung fields  Abdomen: Soft, nontender, nondistended, + bowel sounds.  Musculoskeletal: No clubbing cyanosis, edema  Data Reviewed: Basic Metabolic Panel:  Recent Labs Lab 08/04/14 0353 08/04/14 1620 08/05/14 0620 08/06/14 0500 08/07/14 0400  NA 145 148* 148* 142 140  K 3.7 3.6 3.5 3.6 4.3  CL 115* 114* 112* 113* 111  CO2 GLUCOSE 158* 129* 161* 145* 135*  BUN 21* CREATININE 0.82 0.56* 0.68 0.68 0.49*  CALCIUM 8.3* 8.8* 7.7* 8.2* 7.9*  MG  --  2.1 2.1 2.1 2.0   Liver Function Tests:  Recent Labs Lab 08/03/14 1730 08/04/14 0353 08/05/14 0620 08/06/14 0500 08/07/14 0400  AST 84* 90* 99* 97* 97*  ALT 34 36 42 44 39  ALKPHOS 91 90 95 109 93  BILITOT 2.3* 2.5* 2.3* 2.3* 2.8*  PROT 6.7 6.5 6.9 6.6 6.2*  ALBUMIN 2.9* 2.7* 3.0* 2.8* 2.6*    Recent Labs Lab 08/03/14 1730  LIPASE 24    Recent Labs Lab 08/03/14 0605 08/04/14 0726 08/05/14 0620 08/06/14 0530 08/07/14 0450  AMMONIA 122* 132* 71* 54* 54*   CBC:  Recent Labs Lab 08/03/14 0339  08/04/14 0353 08/04/14 1507 08/05/14 0620 08/06/14 0500 08/07/14 0400  WBC 8.9  --  8.7  --  12.3* 11.6* 11.7*  HGB 7.2*  < > 8.9*  10.0* 10.3* 9.6* 8.8*  HCT 21.7*  < > 26.6* 30.0* 31.4* 29.6* 27.1*  MCV 103.3*  --  94.3  --  98.1 100.7* 101.5*  PLT 262  --  178  --  237 205 213  < > = values in this interval not displayed. Cardiac Enzymes: No results for input(s): CKTOTAL, CKMB, CKMBINDEX, TROPONINI in the last 168 hours. BNP (last 3 results)  Recent Labs  08/03/14 1730  BNP 35.6    ProBNP (last 3 results) No results for input(s): PROBNP in the last 8760 hours.  CBG:  Recent Labs Lab 08/03/14 0341 08/03/14 1103 08/05/14 0829  GLUCAP 117* 96 128*    Recent Results (from the past 240 hour(s))  MRSA PCR Screening     Status: None   Collection Time: 08/03/14  8:04 AM  Result Value Ref Range Status   MRSA by PCR NEGATIVE NEGATIVE Final    Comment:        The GeneXpert MRSA Assay (FDA approved for NASAL specimens only), is one component of a comprehensive MRSA colonization surveillance program. It is not intended to diagnose MRSA infection nor to guide or monitor treatment for MRSA infections.   Urine culture  Status: None   Collection Time: 08/04/14  1:08 PM  Result Value Ref Range Status   Specimen Description URINE, CATHETERIZED  Final   Special Requests NONE  Final   Culture   Final    NO GROWTH 2 DAYS Performed at [facility]    Report Status 08/06/2014 FINAL  Final     Studies: Dg Abd Acute W/chest  08/06/2014   CLINICAL DATA:  Follow-up ileus  EXAM: DG ABDOMEN ACUTE W/ 1V CHEST  COMPARISON:  08/05/2014  FINDINGS: Nasogastric tube is appropriately positioned. Lungs are hypoaerated with crowding of the bronchovascular markings. Moderate enlargement of the cardiomediastinal silhouette noted with central vascular congestion but no overt edema. No free air beneath the diaphragms.  Interval subjective increase in gaseous distention of multiple loops of bowel throughout the abdomen, with differential air-fluid levels on decubitus imaging. No new abnormal radiopacity or acute  osseous abnormality.  IMPRESSION: Subjective interval increase in gaseous prominence of multiple loops of bowel in a pattern most typical for ileus.   Electronically Signed   By: Christiana Pellant M.D.   On: 08/06/2014 15:29    Scheduled Meds: . atorvastatin  40 mg Oral q morning - 10a  . cefTRIAXone (ROCEPHIN)  IV  1 g Intravenous Q24H  . folic acid  1 mg Oral Daily  . lactulose  45 g Per Tube TID  . lactulose  300 mL Rectal BID  . metoprolol  5 mg Intravenous 3 times per day  . multivitamin with minerals  1 tablet Oral Daily  . pantoprazole (PROTONIX) IV  40 mg Intravenous Q12H  . rifaximin  550 mg Per Tube BID  . sodium chloride  10-40 mL Intracatheter Q12H  . sodium chloride  3 mL Intravenous Q12H  . thiamine  100 mg Oral Daily   Or  . thiamine  100 mg Intravenous Daily   Continuous Infusions: . dextrose 5 % 1,000 mL with potassium chloride 40 mEq infusion 100 mL/hr at 08/07/14 0000  . octreotide  (SANDOSTATIN)    IV infusion 50 mcg/hr (08/07/14 0400)  . pantoprozole (PROTONIX) infusion 8 mg/hr (08/07/14 0413)    Principal Problem:   GI bleed Active Problems:   Acute encephalopathy   Encephalopathy, hepatic   Hypertension   Hyperlipidemia   Cardiac arrest   Rectal bleeding   Alcohol abuse   Chronic diastolic CHF (congestive heart failure)   GIB (gastrointestinal bleeding)   Abdominal pain   Hepatic cirrhosis   Acute blood loss anemia   Adynamic ileus   Hypernatremia   Leukocytosis   Esophageal varices in alcoholic cirrhosis    Time spent: 40 mins    [facility] MD [facility] Pager [phone]. If 7PM-7AM, please contact night-coverage at [email or web address], password [facility] 08/07/2014, 9:50 AM  LOS: 4 days

## 2014-08-08 ENCOUNTER — Inpatient Hospital Stay (HOSPITAL_COMMUNITY): Payer: Medicare Other

## 2014-08-08 DIAGNOSIS — K560 Paralytic ileus: Secondary | ICD-10-CM

## 2014-08-08 DIAGNOSIS — K56609 Unspecified intestinal obstruction, unspecified as to partial versus complete obstruction: Secondary | ICD-10-CM | POA: Diagnosis not present

## 2014-08-08 LAB — COMPREHENSIVE METABOLIC PANEL
ALK PHOS: 83 U/L (ref 38–126)
ALT: 38 U/L (ref 17–63)
AST: 77 U/L — ABNORMAL HIGH (ref 15–41)
Albumin: 2.5 g/dL — ABNORMAL LOW (ref 3.5–5.0)
Anion gap: 4 — ABNORMAL LOW (ref 5–15)
BILIRUBIN TOTAL: 2.7 mg/dL — AB (ref 0.3–1.2)
BUN: 10 mg/dL (ref 6–20)
CALCIUM: 7.9 mg/dL — AB (ref 8.9–10.3)
CO2: 26 mmol/L (ref 22–32)
CREATININE: 0.56 mg/dL — AB (ref 0.61–1.24)
Chloride: 105 mmol/L (ref 101–111)
GFR calc Af Amer: >60 mL/min (ref >60)
GFR calc non Af Amer: >60 mL/min (ref >60)
Glucose, Bld: 141 mg/dL — ABNORMAL HIGH (ref 65–99)
Potassium: 4.2 mmol/L (ref 3.5–5.1)
SODIUM: 135 mmol/L (ref 135–145)
TOTAL PROTEIN: 6.1 g/dL — AB (ref 6.5–8.1)

## 2014-08-08 LAB — CBC
HEMATOCRIT: 25.4 % — AB (ref 39.0–52.0)
HEMOGLOBIN: 8.2 g/dL — AB (ref 13.0–17.0)
MCH: 32.4 pg (ref 26.0–34.0)
MCHC: 32.3 g/dL (ref 30.0–36.0)
MCV: 100.4 fL — ABNORMAL HIGH (ref 78.0–100.0)
Platelets: 166 10*3/uL (ref 150–400)
RBC: 2.53 MIL/uL — ABNORMAL LOW (ref 4.22–5.81)
RDW: 17.4 % — ABNORMAL HIGH (ref 11.5–15.5)
WBC: 10.5 10*3/uL (ref 4.0–10.5)

## 2014-08-08 LAB — GLUCOSE, CAPILLARY: GLUCOSE-CAPILLARY: 134 mg/dL — AB (ref 65–99)

## 2014-08-08 LAB — MAGNESIUM: Magnesium: 1.8 mg/dL (ref 1.7–2.4)

## 2014-08-08 LAB — AMMONIA: AMMONIA: 53 umol/L — AB (ref 9–35)

## 2014-08-08 LAB — PROTIME-INR
INR: 1.57 — AB (ref 0.00–1.49)
Prothrombin Time: 18.8 seconds — ABNORMAL HIGH (ref 11.6–15.2)

## 2014-08-08 MED ORDER — ACETAMINOPHEN 10 MG/ML IV SOLN
500.0000 mg | Freq: Four times a day (QID) | INTRAVENOUS | Status: AC | PRN
  Administered 2014-08-08 (×2): 500 mg via INTRAVENOUS
  Filled 2014-08-08 (×6): qty 50

## 2014-08-08 MED ORDER — KETOROLAC TROMETHAMINE 30 MG/ML IJ SOLN
30.0000 mg | Freq: Four times a day (QID) | INTRAMUSCULAR | Status: DC | PRN
  Filled 2014-08-08: qty 1

## 2014-08-08 MED ORDER — MAGNESIUM SULFATE 50 % IJ SOLN
3.0000 g | Freq: Once | INTRAVENOUS | Status: AC
  Administered 2014-08-08: 3 g via INTRAVENOUS
  Filled 2014-08-08: qty 6

## 2014-08-08 MED ORDER — LACTULOSE 10 GM/15ML PO SOLN: 20.0000 g | Freq: Three times a day (TID) | ORAL | Status: DC

## 2014-08-08 NOTE — Progress Notes (Signed)
Urine noted to be red (bloody) when emptying foley bag at 0645. Until this time urine had appeared Amber in color. Will pass on to day shift RN. Bosie Helper, RN

## 2014-08-08 NOTE — Progress Notes (Signed)
[facility] Progress Note    Since last GI note: EGD early yesterday AM with banding of large esophageal varices, see full report in chart.  MS is improved but still disoriented at times, requiring sedation for safety, CIWA.  Abd distension continues, this is uncomfortable and he's required pain meds for it.  Objective: Vital signs in last 24 hours: Temp:  [98.2 F (36.8 C)-98.9 F (37.2 C)] 98.2 F (36.8 C) (06/19 0800) Pulse Rate:  [75-99] 95 (06/19 0800) Resp:  [12-22] 20 (06/19 0800) BP: (126-168)/(45-86) 159/83 mmHg (06/19 0800) SpO2:  [94 %-100 %] 98 % (06/19 0800) Last BM Date: 08/07/14 General: alert and oriented times 2 Heart: regular rate and rythm Abdomen: tense, tympanic, distended,  + bowel sounds   Lab Results:  Recent Labs  08/06/14 0500 08/07/14 0400 08/08/14 0434  WBC 11.6* 11.7* 10.5  HGB 9.6* 8.8* 8.2*  PLT 205 213 166  MCV 100.7* 101.5* 100.4*    Recent Labs  08/06/14 0500 08/07/14 0400 08/08/14 0434  NA 142 140 135  K 3.6 4.3 4.2  CL 113* 111 105  CO2 24 24 26   GLUCOSE 145* 135* 141*  BUN 14 10 10   CREATININE 0.68 0.49* 0.56*  CALCIUM 8.2* 7.9* 7.9*    Recent Labs  08/06/14 0500 08/07/14 0400 08/08/14 0434  PROT 6.6 6.2* 6.1*  ALBUMIN 2.8* 2.6* 2.5*  AST 97* 97* 77*  ALT 44 39 38  ALKPHOS 109 93 83  BILITOT 2.3* 2.8* 2.7*    Recent Labs  08/08/14 0500  INR 1.57*     Studies/Results: Dg Abd 1 View  08/07/2014   CLINICAL DATA:  Follow-up adynamic ileus.  Subsequent encounter.  EXAM: ABDOMEN - 1 VIEW  COMPARISON:  Chest and abdominal radiograph performed 08/06/2014  FINDINGS: Distention of a few small bowel loops is again noted, though air is seen filling the colon. This may reflect mild ileus, as previously noted. There is no evidence for distal obstruction.  No free intra-abdominal air is identified, though evaluation for free air is limited on supine views. The visualized lung bases are grossly clear. No acute  osseous abnormalities are identified.  IMPRESSION: Distention of a few small-bowel loops again noted, though air is seen filling the colon. This may reflect mild ileus, as previously noted. No evidence for distal obstruction. No free intra-abdominal air seen.   Electronically Signed   By: Roanna Raider M.D.   On: 08/07/2014 10:27   Dg Abd Acute W/chest  08/06/2014   CLINICAL DATA:  Follow-up ileus  EXAM: DG ABDOMEN ACUTE W/ 1V CHEST  COMPARISON:  08/05/2014  FINDINGS: Nasogastric tube is appropriately positioned. Lungs are hypoaerated with crowding of the bronchovascular markings. Moderate enlargement of the cardiomediastinal silhouette noted with central vascular congestion but no overt edema. No free air beneath the diaphragms.  Interval subjective increase in gaseous distention of multiple loops of bowel throughout the abdomen, with differential air-fluid levels on decubitus imaging. No new abnormal radiopacity or acute osseous abnormality.  IMPRESSION: Subjective interval increase in gaseous prominence of multiple loops of bowel in a pattern most typical for ileus.   Electronically Signed   By: Christiana Pellant M.D.   On: 08/06/2014 15:29   Dg Abd Portable 1v  08/08/2014   CLINICAL DATA:  Acute generalized abdominal pain.  EXAM: PORTABLE ABDOMEN - 1 VIEW  COMPARISON:  August 07, 2014.  FINDINGS: There appears to be significantly increased small bowel distention concerning for distal small bowel obstruction. No definite colonic  dilatation is noted. No abnormal calcifications are noted.  IMPRESSION: Significantly increased small bowel distention is noted concerning for distal small bowel obstruction.   Electronically Signed   By: Lupita Raider, M.D.   On: 08/08/2014 08:52     Medications: Scheduled Meds: . antiseptic oral rinse  7 mL Mouth Rinse BID  . atorvastatin  40 mg Oral q morning - 10a  . folic acid  1 mg Oral Daily  . lactulose  45 g Per Tube TID  . lactulose  300 mL Rectal BID  .  magnesium sulfate 1 - 4 g bolus IVPB  3 g Intravenous Once  . metoprolol  5 mg Intravenous 3 times per day  . multivitamin with minerals  1 tablet Oral Daily  . pantoprazole (PROTONIX) IV  40 mg Intravenous Q12H  . rifaximin  550 mg Per Tube BID  . sodium chloride  10-40 mL Intracatheter Q12H  . sodium chloride  3 mL Intravenous Q12H  . thiamine  100 mg Oral Daily   Or  . thiamine  100 mg Intravenous Daily   Continuous Infusions: . dextrose 5 % 1,000 mL with potassium chloride 40 mEq infusion 75 mL/hr at 08/08/14 0700  . octreotide  (SANDOSTATIN)    IV infusion 50 mcg/hr (08/08/14 0700)  . pantoprozole (PROTONIX) infusion 8 mg/hr (08/08/14 0700)   PRN Meds:.EPINEPHrine, fluticasone, hydrALAZINE, ipratropium, ipratropium-albuterol, LORazepam **OR** LORazepam, morphine injection, ondansetron (ZOFRAN) IV, sodium chloride    Assessment/Plan: 74 y.o. male with etoh cirrhosis, hepatic encephalopathy, etoh withdrawal, variceal bleed  Ileus continues, will back down a bit on oral lactulose as this may contribute to excessive gas production.  Ambulating as much as possible or at least getting him out of bed to chair may help.  I'm reluctant to have GN replaced given ligating bands placed just yesterday AM. Should continue octreotide another 48 hours.     Rachael Fee, MD  08/08/2014, 9:04 AM [facility] Pager [phone]

## 2014-08-08 NOTE — Progress Notes (Signed)
Wasted 1 mg IV Morphine at 0400 for [facility] on 08/07/14.

## 2014-08-08 NOTE — Evaluation (Signed)
Physical Therapy Evaluation Patient Details Name: Keith [name] MRN: [id number] DOB: October 02, 1940 Today's Date: 08/08/2014   History of Present Illness  Keith [name] is a 74 y.o. male with PMH of hypertension, hyperlipidemia, diastolic congestive heart failure, history of GI bleeding, alcohol abuse, who presents with rectal bleeding, altered mental status and abdominal pain; He has SBO of unknown etiology; Pt has been unable to mobilize d/t sedation (d/t hx of ETOH), he can't have NGT d/t  esophageal varices per discussion wtih MD;   Clinical Impression  Pt admitted with above diagnosis. Pt currently with functional limitations due to the deficits listed below (see PT Problem List).  Pt will benefit from skilled PT to increase their independence and safety with mobility to allow discharge to the venue listed below.  Will continue to follow for needs; Pt would likely be able to amb if he were not so lethargic at time of this eval; obs pt with  spontaneous rolling and position changes  in bed without assist;  Pt transferred from  EOB in sitting to chair with maxi sky d/t as pt is too lethargic to participate with PT or any attempt to amb; Pt family reports that the pt "had his days and nights mixed up" prior to adm, stating that he slept most of day at home;     Follow Up Recommendations SNF    Equipment Recommendations  Other (comment) (TBA)    Recommendations for Other Services       Precautions / Restrictions Precautions Precautions: Fall      Mobility  Bed Mobility Overal bed mobility: Needs Assistance Bed Mobility: Supine to Sit     Supine to sit: +2 for physical assistance;Total assist;Max assist     General bed mobility comments: pt requries +2  for LEs, and to bring trunk to upright  Transfers                 General transfer comment: unable to attempt d/t pt too lethargic  Ambulation/Gait                Stairs            Wheelchair  Mobility    Modified Rankin (Stroke Patients Only)       Balance Overall balance assessment: Needs assistance Sitting-balance support: No upper extremity supported Sitting balance-Leahy Scale: Zero Sitting balance - Comments: pt able to maintain static sit with min/guard for very brief periods (<2sec at a time);  he requires mod to max assist most of time iwth multi0directional LOB and is unable to correct with tactile and verbal cues                                     Pertinent Vitals/Pain Pain Assessment: Faces Faces Pain Scale: Hurts a little bit Pain Descriptors / Indicators: Moaning    Home Living Family/patient expects to be discharged to:: Private residence Living Arrangements: Children Available Help at Discharge: Family Type of Home: House Home Access: Stairs to enter   Secretary/administrator of Steps: 1 Home Layout: One level Home Equipment: None      Prior Function Level of Independence: Independent               Hand Dominance        Extremity/Trunk Assessment   Upper Extremity Assessment: Defer to OT evaluation  Lower Extremity Assessment: Generalized weakness (difficult to assess d/t lethargy)         Communication   Communication: No difficulties  Cognition Arousal/Alertness: Lethargic;Suspect due to medications Behavior During Therapy: Flat affect Overall Cognitive Status: Difficult to assess Area of Impairment: Safety/judgement         Safety/Judgement: Decreased awareness of safety;Decreased awareness of deficits     General Comments: pt opens eyes only briefly and is unable to complete a sentence; requires deep sternal run to arouse, even with imposed mobility he remains extremely lethargic    General Comments      Exercises        Assessment/Plan    PT Assessment Patient needs continued PT services  PT Diagnosis Difficulty walking   PT Problem List Decreased mobility;Decreased  strength;Decreased activity tolerance;Decreased balance;Decreased safety awareness  PT Treatment Interventions DME instruction;Gait training;Functional mobility training;Therapeutic activities;Therapeutic exercise;Patient/family education   PT Goals (Current goals can be found in the Care Plan section) Acute Rehab PT Goals Patient Stated Goal: none stated PT Goal Formulation: Patient unable to participate in goal setting Time For Goal Achievement: 08/22/14 Potential to Achieve Goals: Good    Frequency Min 3X/week   Barriers to discharge        Co-evaluation               End of Session   Activity Tolerance: Patient limited by lethargy Patient left: in chair;with call bell/phone within reach;with family/visitor present Nurse Communication: Mobility status         Time: 2671-2458 PT Time Calculation (min) (ACUTE ONLY): 31 min   Charges:   PT Evaluation $Initial PT Evaluation Tier I: 1 Procedure PT Treatments $Therapeutic Activity: 8-22 mins   PT G Codes:        Malin Sambrano 2014-08-28, 1:32 PM

## 2014-08-08 NOTE — Progress Notes (Signed)
I entered the room after being alerted by the patients bed alarm. Upon entering the room, the patient was trying to sit up in bed with both legs hanging off of the right side of the bed. The patient was disoriented and stating, "I need to get to that room." Attempts to reorient the patient were unsuccessful and he continued to attempt to get out of the bed and his agitation increased. Per patients daughter, patient awoke and attempted to get out of bed by swinging his legs off the side of the bed, when she attempted to put his legs back on the bed, the patient rapidly moved his legs to the opposite side of the bed and attempted to get up. Patients daughter stated "he felt like he had super human strength." The NT and myself repositioned the patient and pulled him up in bed and reset the bed alarm. The patient remained agitated but is laying still for now. Will continue to monitor. S.Kashay Cavenaugh, RN

## 2014-08-08 NOTE — Progress Notes (Signed)
[facility] PROGRESS NOTE  Keith [name] YTK:[id number] DOB: 1940/10/09 DOA: 08/03/2014 PCP: Georgann Housekeeper, MD  Assessment/Plan: #1 GI bleed/large esophageal varices status post banding 7 per EGD 08/07/2014 Upper GI . Patient with some bleeding overnight and subsequently underwent upper endoscopy early yesterday morning(08/07/14) which showed large esophageal varices status post banding 7. Also noted was a distal blood non-bulb stricture with some bleeding after scope was passed through.  CT abdomen and pelvis with inflammation in the third portion of the duodenum concerning for duodenal ulcer and also small developing esophageal varices with portal hypertension and changes of cirrhosis noted. Patient currently with an acute encephalopathy that seems to be slowly improving as he opens his eyes to verbal stimuli. Patient status post EGD. Continue IV PPI and IV octreotide per GI recommendations. Xifaxan has been added per GI. Patient status post 2 units packed red blood cells hemoglobin currently at 8.2 from 8.8 from 10.3 from 8.9 from 7.2 on admission. Patient's hemoglobin was 12.1 on 05/13/2013. Patient will likely need repeat EGD in about 3-4 weeks for repeat banding per GI. Patient currently on IV Lopressor. When tolerating oral intake will transition to a nonselective beta blocker. Follow H&H. Per GI.  #2 acute blood loss anemia Secondary to #1. Patient status post 2 units of packed red blood cells. Hemoglobin currently at 8.2 from 8.8 from 9.6 from 10.3 from 8.9 from 7.2 on admission. Hemoglobin was 12.1 on 05/13/2013.  #3 acute encephalopathy/hepatic encephalopathy Likely secondary to hepatic encephalopathy. Improvement with mental status as patient opens eyes to verbal stimuli. Per family patient was able to interact with them yesterday however patient with increased bloody output from NG tube 2 nights ago and subsequently underwent EGD yesterday. CT head negative for any acute  abnormalities. Ammonia level was elevated at 132 and is trending down currently at 53 today. Continue lactulose enemas and Xifaxan. Decreased dose of Ativan. Discontinue morphine secondary to worsening ileus.  GI following.  #4 intermittent abdominal pain Appears to be chronic in nature versus now secondary to worsening ileus. Patient on empiric IV Rocephin for possible SBP. CT abdomen and pelvis with inflammation noted in the third portion of the duodenum concerning for duodenal ulcer and also diverticulosis with small developing esophageal varices and portal hypertension with changes of cirrhosis noted. Doubt if patient has SBP. Will discontinue IV Rocephin. Discontinue IV narcotics and placed on when necessary IV Tylenol as needed for pain. Follow.  #5 worsening ileus versus small bowel obstruction Questionable etiology. Will discontinue IV narcotics. Mobilize. Ambulate. Keep potassium greater than 4. Keep magnesium greater than 2. Will discontinue lactulose orally. Unable to place NG tube secondary to large esophageal varices with recent banding. Follow.  #6 cirrhosis Per CT of the abdomen and pelvis. Continue lactulose enemas twice a day. DC oral lactulose secondary to worsening ileus versus small bowel obstruction. Continue Xifaxan. Will likely need follow-up with GI as outpatient. Once patient is more alert will likely need to resume his home regimen of Lasix and spironolactone. Follow for now.  #7 chronic diastolic CHF Stable. 2-D echo from May 2015 with a EF of 60-65% with grade 1 diastolic dysfunction. Lasix and Aldactone on hold for now secondary to problem #1. Follow.  #8 hypertension Diuretics on hold. Continue IV beta blocker.  #9 alcohol abuse Continue the Ativan withdrawal protocol.  #10 hypernatremia Continue D5W at 75 mL per hour. Follow.  #11 prophylaxis PPI for GI prophylaxis. SCDs for DVT prophylaxis.  Code Status: Full Family Communication: Updated daughter  and  ex-wife at bedside.  Disposition Plan: Remain in step down unit.   Consultants:  Gastroenterology: Dr. Christella Hartigan 08/03/2014  Procedures:  CT head 08/03/14  CT abdomen and pelvis 08/03/2014  Abdominal ultrasound 08/03/2014  2 units packed red blood cells 08/03/2014  PICC line 08/04/2014  Upper endoscopy 08/07/2014 per Dr. Christella Hartigan  Antibiotics:  IV Rocephin 08/03/2014>>>>>> 08/06/2014  HPI/Subjective: Patient more alert today however uncomfortable with complaints of abdominal pain. Patient is given some morphine and received some Ativan early on and a such is sleepy however easily arousable and answers some questions. No bleeding overnight.  Objective: Filed Vitals:   08/08/14 0800  BP: 159/83  Pulse: 95  Temp: 98.2 F (36.8 C)  Resp: 20    Intake/Output Summary (Last 24 hours) at 08/08/14 1006 Last data filed at 08/08/14 1000  Gross per 24 hour  Intake 3142.67 ml  Output   1225 ml  Net 1917.67 ml   Filed Weights   08/03/14 0800 08/04/14 0400  Weight: 93 kg (205 lb 0.4 oz) 94.9 kg (209 lb 3.5 oz)    Exam:   General:  Opens eyes to verbal stimuli, following commands.  Cardiovascular: RRR  Respiratory: CTAB anterior lung fields  Abdomen: Tight, distended, hypoactive BS, no rebound, no guarding.  Musculoskeletal: No clubbing cyanosis, edema  Data Reviewed: Basic Metabolic Panel:  Recent Labs Lab 08/04/14 1620 08/05/14 0620 08/06/14 0500 08/07/14 0400 08/08/14 0434  NA 148* 148* 142 140 135  K 3.6 3.5 3.6 4.3 4.2  CL 114* 112* 113* 111 105  CO2 24 22 24 24 26   GLUCOSE 129* 161* 145* 135* 141*  BUN 20 18 14 10 10   CREATININE 0.56* 0.68 0.68 0.49* 0.56*  CALCIUM 8.8* 7.7* 8.2* 7.9* 7.9*  MG 2.1 2.1 2.1 2.0 1.8   Liver Function Tests:  Recent Labs Lab 08/04/14 0353 08/05/14 0620 08/06/14 0500 08/07/14 0400 08/08/14 0434  AST 90* 99* 97* 97* 77*  ALT 36 42 44 39 38  ALKPHOS 90 95 109 93 83  BILITOT 2.5* 2.3* 2.3* 2.8* 2.7*  PROT 6.5  6.9 6.6 6.2* 6.1*  ALBUMIN 2.7* 3.0* 2.8* 2.6* 2.5*    Recent Labs Lab 08/03/14 1730  LIPASE 24    Recent Labs Lab 08/04/14 0726 08/05/14 0620 08/06/14 0530 08/07/14 0450 08/08/14 0434  AMMONIA 132* 71* 54* 54* 53*   CBC:  Recent Labs Lab 08/04/14 0353 08/04/14 1507 08/05/14 0620 08/06/14 0500 08/07/14 0400 08/08/14 0434  WBC 8.7  --  12.3* 11.6* 11.7* 10.5  HGB 8.9* 10.0* 10.3* 9.6* 8.8* 8.2*  HCT 26.6* 30.0* 31.4* 29.6* 27.1* 25.4*  MCV 94.3  --  98.1 100.7* 101.5* 100.4*  PLT 178  --  237 205 213 166   Cardiac Enzymes: No results for input(s): CKTOTAL, CKMB, CKMBINDEX, TROPONINI in the last 168 hours. BNP (last 3 results)  Recent Labs  08/03/14 1730  BNP 35.6    ProBNP (last 3 results) No results for input(s): PROBNP in the last 8760 hours.  CBG:  Recent Labs Lab 08/03/14 0341 08/03/14 1103 08/05/14 0829 08/08/14 0805  GLUCAP 117* 96 128* 134*    Recent Results (from the past 240 hour(s))  MRSA PCR Screening     Status: None   Collection Time: 08/03/14  8:04 AM  Result Value Ref Range Status   MRSA by PCR NEGATIVE NEGATIVE Final    Comment:        The GeneXpert MRSA Assay (FDA approved for NASAL specimens only), is  one component of a comprehensive MRSA colonization surveillance program. It is not intended to diagnose MRSA infection nor to guide or monitor treatment for MRSA infections.   Urine culture     Status: None   Collection Time: 08/04/14  1:08 PM  Result Value Ref Range Status   Specimen Description URINE, CATHETERIZED  Final   Special Requests NONE  Final   Culture   Final    NO GROWTH 2 DAYS Performed at [facility]    Report Status 08/06/2014 FINAL  Final     Studies: Dg Abd 1 View  08/07/2014   CLINICAL DATA:  Follow-up adynamic ileus.  Subsequent encounter.  EXAM: ABDOMEN - 1 VIEW  COMPARISON:  Chest and abdominal radiograph performed 08/06/2014  FINDINGS: Distention of a few small bowel loops is again  noted, though air is seen filling the colon. This may reflect mild ileus, as previously noted. There is no evidence for distal obstruction.  No free intra-abdominal air is identified, though evaluation for free air is limited on supine views. The visualized lung bases are grossly clear. No acute osseous abnormalities are identified.  IMPRESSION: Distention of a few small-bowel loops again noted, though air is seen filling the colon. This may reflect mild ileus, as previously noted. No evidence for distal obstruction. No free intra-abdominal air seen.   Electronically Signed   By: Roanna Raider M.D.   On: 08/07/2014 10:27   Dg Abd Acute W/chest  08/06/2014   CLINICAL DATA:  Follow-up ileus  EXAM: DG ABDOMEN ACUTE W/ 1V CHEST  COMPARISON:  08/05/2014  FINDINGS: Nasogastric tube is appropriately positioned. Lungs are hypoaerated with crowding of the bronchovascular markings. Moderate enlargement of the cardiomediastinal silhouette noted with central vascular congestion but no overt edema. No free air beneath the diaphragms.  Interval subjective increase in gaseous distention of multiple loops of bowel throughout the abdomen, with differential air-fluid levels on decubitus imaging. No new abnormal radiopacity or acute osseous abnormality.  IMPRESSION: Subjective interval increase in gaseous prominence of multiple loops of bowel in a pattern most typical for ileus.   Electronically Signed   By: Christiana Pellant M.D.   On: 08/06/2014 15:29   Dg Abd Portable 1v  08/08/2014   CLINICAL DATA:  Acute generalized abdominal pain.  EXAM: PORTABLE ABDOMEN - 1 VIEW  COMPARISON:  August 07, 2014.  FINDINGS: There appears to be significantly increased small bowel distention concerning for distal small bowel obstruction. No definite colonic dilatation is noted. No abnormal calcifications are noted.  IMPRESSION: Significantly increased small bowel distention is noted concerning for distal small bowel obstruction.   Electronically  Signed   By: Lupita Raider, M.D.   On: 08/08/2014 08:52    Scheduled Meds: . antiseptic oral rinse  7 mL Mouth Rinse BID  . atorvastatin  40 mg Oral q morning - 10a  . folic acid  1 mg Oral Daily  . lactulose  20 g Oral TID  . lactulose  300 mL Rectal BID  . magnesium sulfate 1 - 4 g bolus IVPB  3 g Intravenous Once  . metoprolol  5 mg Intravenous 3 times per day  . multivitamin with minerals  1 tablet Oral Daily  . pantoprazole (PROTONIX) IV  40 mg Intravenous Q12H  . rifaximin  550 mg Per Tube BID  . sodium chloride  10-40 mL Intracatheter Q12H  . sodium chloride  3 mL Intravenous Q12H  . thiamine  100 mg Oral Daily  Or  . thiamine  100 mg Intravenous Daily   Continuous Infusions: . dextrose 5 % 1,000 mL with potassium chloride 40 mEq infusion 75 mL/hr at 08/08/14 1000  . octreotide  (SANDOSTATIN)    IV infusion 50 mcg/hr (08/08/14 1000)    Principal Problem:   GI bleed Active Problems:   Acute encephalopathy   Encephalopathy, hepatic   Hypertension   Hyperlipidemia   Cardiac arrest   Rectal bleeding   Alcohol abuse   Chronic diastolic CHF (congestive heart failure)   GIB (gastrointestinal bleeding)   Abdominal pain   Hepatic cirrhosis   Acute blood loss anemia   Adynamic ileus   Hypernatremia   Leukocytosis   Esophageal varices in alcoholic cirrhosis   SBO (small bowel obstruction)    Time spent: 40 mins    [facility] MD [facility] Pager [phone]. If 7PM-7AM, please contact night-coverage at [email or web address], password [facility] 08/08/2014, 10:06 AM  LOS: 5 days

## 2014-08-08 NOTE — Progress Notes (Signed)
Patient continues to be confused, anxious, agitated, and attempting to get out of bed. CIWA was re-scored and PRN medication was administered to help decrease the patients agitation and anxiety. Patients daughter remains at bedside with the patient. Will continue to monitor. Bosie Helper, RN

## 2014-08-09 ENCOUNTER — Encounter (HOSPITAL_COMMUNITY): Payer: Self-pay | Admitting: Gastroenterology

## 2014-08-09 ENCOUNTER — Inpatient Hospital Stay (HOSPITAL_COMMUNITY): Payer: Medicare Other

## 2014-08-09 DIAGNOSIS — K7290 Hepatic failure, unspecified without coma: Secondary | ICD-10-CM

## 2014-08-09 DIAGNOSIS — I8501 Esophageal varices with bleeding: Secondary | ICD-10-CM | POA: Insufficient documentation

## 2014-08-09 DIAGNOSIS — K7031 Alcoholic cirrhosis of liver with ascites: Principal | ICD-10-CM

## 2014-08-09 DIAGNOSIS — K7460 Unspecified cirrhosis of liver: Secondary | ICD-10-CM | POA: Insufficient documentation

## 2014-08-09 LAB — COMPREHENSIVE METABOLIC PANEL
ALBUMIN: 2.3 g/dL — AB (ref 3.5–5.0)
ALK PHOS: 70 U/L (ref 38–126)
ALT: 37 U/L (ref 17–63)
AST: 74 U/L — ABNORMAL HIGH (ref 15–41)
Anion gap: 3 — ABNORMAL LOW (ref 5–15)
BUN: 21 mg/dL — ABNORMAL HIGH (ref 6–20)
CO2: 25 mmol/L (ref 22–32)
Calcium: 7.5 mg/dL — ABNORMAL LOW (ref 8.9–10.3)
Chloride: 106 mmol/L (ref 101–111)
Creatinine, Ser: 1.03 mg/dL (ref 0.61–1.24)
GFR calc Af Amer: >60 mL/min (ref >60)
GFR calc non Af Amer: >60 mL/min (ref >60)
Glucose, Bld: 123 mg/dL — ABNORMAL HIGH (ref 65–99)
Potassium: 4.7 mmol/L (ref 3.5–5.1)
Sodium: 134 mmol/L — ABNORMAL LOW (ref 135–145)
TOTAL PROTEIN: 5.3 g/dL — AB (ref 6.5–8.1)
Total Bilirubin: 2.2 mg/dL — ABNORMAL HIGH (ref 0.3–1.2)

## 2014-08-09 LAB — CBC
HCT: 26 % — ABNORMAL LOW (ref 39.0–52.0)
HEMATOCRIT: 17.2 % — AB (ref 39.0–52.0)
Hemoglobin: 5.6 g/dL — CL (ref 13.0–17.0)
Hemoglobin: 8.5 g/dL — ABNORMAL LOW (ref 13.0–17.0)
MCH: 32.4 pg (ref 26.0–34.0)
MCH: 30 pg (ref 26.0–34.0)
MCHC: 32.6 g/dL (ref 30.0–36.0)
MCHC: 32.7 g/dL (ref 30.0–36.0)
MCV: 99.4 fL (ref 78.0–100.0)
MCV: 91.9 fL (ref 78.0–100.0)
Platelets: 173 10*3/uL (ref 150–400)
Platelets: 158 10*3/uL (ref 150–400)
RBC: 1.73 MIL/uL — AB (ref 4.22–5.81)
RBC: 2.83 MIL/uL — ABNORMAL LOW (ref 4.22–5.81)
RDW: 17.5 % — AB (ref 11.5–15.5)
RDW: 18.3 % — AB (ref 11.5–15.5)
WBC: 12.9 10*3/uL — AB (ref 4.0–10.5)
WBC: 12.7 10*3/uL — ABNORMAL HIGH (ref 4.0–10.5)

## 2014-08-09 LAB — GLUCOSE, CAPILLARY: Glucose-Capillary: 113 mg/dL — ABNORMAL HIGH (ref 65–99)

## 2014-08-09 LAB — MAGNESIUM: Magnesium: 2.3 mg/dL (ref 1.7–2.4)

## 2014-08-09 LAB — AMMONIA: AMMONIA: 65 umol/L — AB (ref 9–35)

## 2014-08-09 MED ORDER — FUROSEMIDE 10 MG/ML IJ SOLN
20.0000 mg | Freq: Once | INTRAMUSCULAR | Status: AC
  Administered 2014-08-09: 20 mg via INTRAVENOUS

## 2014-08-09 MED ORDER — FUROSEMIDE 10 MG/ML IJ SOLN
INTRAMUSCULAR | Status: AC
  Administered 2014-08-09: 20 mg via INTRAVENOUS
  Filled 2014-08-09: qty 2

## 2014-08-09 MED ORDER — DIPHENHYDRAMINE HCL 25 MG PO CAPS
25.0000 mg | ORAL_CAPSULE | Freq: Once | ORAL | Status: AC
  Administered 2014-08-09: 25 mg via ORAL
  Filled 2014-08-09: qty 1

## 2014-08-09 MED ORDER — FUROSEMIDE 10 MG/ML IJ SOLN
20.0000 mg | Freq: Once | INTRAMUSCULAR | Status: AC
  Administered 2014-08-09: 20 mg via INTRAVENOUS
  Filled 2014-08-09: qty 2

## 2014-08-09 MED ORDER — VITAMIN K1 10 MG/ML IJ SOLN
10.0000 mg | Freq: Every day | INTRAMUSCULAR | Status: AC
  Administered 2014-08-09 – 2014-08-11 (×3): 10 mg via SUBCUTANEOUS
  Filled 2014-08-09 (×3): qty 1

## 2014-08-09 MED ORDER — SODIUM CHLORIDE 0.9 % IV SOLN
Freq: Once | INTRAVENOUS | Status: AC
  Administered 2014-08-09: 09:00:00 via INTRAVENOUS

## 2014-08-09 MED ORDER — ACETAMINOPHEN 325 MG PO TABS
650.0000 mg | ORAL_TABLET | Freq: Once | ORAL | Status: AC
  Administered 2014-08-09: 650 mg via ORAL
  Filled 2014-08-09: qty 2

## 2014-08-09 NOTE — Progress Notes (Signed)
Progress Note   Subjective  no specific complaints   Objective   Vital signs in last 24 hours: Temp:  [97.4 F (36.3 C)-99 F (37.2 C)] 99 F (37.2 C) (06/20 0800) Pulse Rate:  [83-123] 102 (06/20 0600) Resp:  [14-25] 25 (06/20 0600) BP: (95-162)/(44-89) 128/64 mmHg (06/20 0600) SpO2:  [94 %-99 %] 95 % (06/20 0600) Last BM Date: 08/08/14 General:    Black male in NAD. Daughter at bedside Heart:  Regular rate and rhythm Abdomen:  Soft, distended, nontender. A few bowel sounds in RLQ. Extremities:  Without edema. Neurologic:  Alert confused, + asterixis Psych:  Cooperative.   Intake/Output from previous day: 06/19 0701 - 06/20 0700 In: 2569 [I.V.:2363; IV Piggyback:206] Out: 995 [Urine:645; Stool:350] Intake/Output this shift:    Lab Results:  Recent Labs  08/07/14 0400 08/08/14 0434 08/09/14 0630  WBC 11.7* 10.5 12.9*  HGB 8.8* 8.2* 5.6*  HCT 27.1* 25.4* 17.2*  PLT 213 166 173   BMET  Recent Labs  08/07/14 0400 08/08/14 0434 08/09/14 0630  NA 140 135 134*  K 4.3 4.2 4.7  CL 111 105 106  CO2 GLUCOSE 135* 141* 123*  BUN 10 10 21*  CREATININE 0.49* 0.56* 1.03  CALCIUM 7.9* 7.9* 7.5*   LFT  Recent Labs  08/09/14 0630  PROT 5.3*  ALBUMIN 2.3*  AST 74*  ALT 37  ALKPHOS 70  BILITOT 2.2*   PT/INR  Recent Labs  08/08/14 0500  LABPROT 18.8*  INR 1.57*    Studies/Results: Dg Abd 1 View  08/07/2014   CLINICAL DATA:  Follow-up adynamic ileus.  Subsequent encounter.  EXAM: ABDOMEN - 1 VIEW  COMPARISON:  Chest and abdominal radiograph performed 08/06/2014  FINDINGS: Distention of a few small bowel loops is again noted, though air is seen filling the colon. This may reflect mild ileus, as previously noted. There is no evidence for distal obstruction.  No free intra-abdominal air is identified, though evaluation for free air is limited on supine views. The visualized lung bases are grossly clear. No acute osseous abnormalities are  identified.  IMPRESSION: Distention of a few small-bowel loops again noted, though air is seen filling the colon. This may reflect mild ileus, as previously noted. No evidence for distal obstruction. No free intra-abdominal air seen.   Electronically Signed   By: Roanna Raider M.D.   On: 08/07/2014 10:27   Dg Abd Portable 1v  08/09/2014   CLINICAL DATA:  Ileus  EXAM: PORTABLE ABDOMEN - 1 VIEW  COMPARISON:  08/08/2014  FINDINGS: Persistent gaseous distended small bowel loops consistent with significant ileus or partial small bowel obstruction.  IMPRESSION: Persistent gaseous distended small bowel loops consistent with significant ileus or small bowel obstruction.   Electronically Signed   By: Natasha Mead M.D.   On: 08/09/2014 08:35   Dg Abd Portable 1v  08/08/2014   CLINICAL DATA:  Acute generalized abdominal pain.  EXAM: PORTABLE ABDOMEN - 1 VIEW  COMPARISON:  August 07, 2014.  FINDINGS: There appears to be significantly increased small bowel distention concerning for distal small bowel obstruction. No definite colonic dilatation is noted. No abnormal calcifications are noted.  IMPRESSION: Significantly increased small bowel distention is noted concerning for distal small bowel obstruction.   Electronically Signed   By: Lupita Raider, M.D.   On: 08/08/2014 08:52   EGD ENDOSCOPIC IMPRESSION: There were three trunks of large esopahgeal varices, one of which had red wale sign. There  was no active bleeding, however there was fairly fresh appearing red blood clot in the proximal stomach. There were no gastric varices. There appeared to be some minor NG related suction trauma in proximal stomach. There was a minor distal duodenal bulb benign appearing stricture that oozed blood after advancing the gastroscope through it. I placed 7 variceal ligating bands on the esophageal varices in good position. The examination was otherwise normal    Assessment / Plan:   19. 74 year old male with  decompensated etoh cirrhosis evidenced by encephalopathy, variceal bleed. He is s/p EGD with banding of large esophageal varices. On Octreotide drip. He is s/p 5 days of Rocephin for SBP prophylaxis. On BID PPI.   Recurrent rectal bleeding with lactulose enema this am . Hgb has fallen from 8.2 to 5.6 overnight. Needs eventual non-selective beta blocker. For blood transfusion today. Will check on him again today, may need repeat endoscopy today. Keep NPO.   2. Ileus, persistent based on today's films. Sodium and potassium okay. Some intraabdominal fluid on CTscan several days ago. Lactulose was already decreased to help with gas. Will ultrasound to make sure not accumulating ascites.   3. Anemia of acute blood loss. Transfusion ordered.   4. 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 Likely correlates with duodenal abnormality seen on CTscan.    LOS: 6 days   Willette Cluster  08/09/2014, 8:43 AM   [facility] Attending  I have also seen and assessed the patient and agree with the advanced practitioner's assessment and plan. He is improved but remains critically ill.  My hx and PE same He has had some bleeding overnight that appears low-grade but the Hgb decrease is greater. Plan to transfuse and support at this point and unless more signs of bleeding will not repeat EGD urgently. Hepatic encephalopathy is improving. Will allow some liquids. US shows small ascites.  Iva Boop, MD, [facility] [phone] (pager) 08/09/2014 1:52 PM

## 2014-08-09 NOTE — Progress Notes (Signed)
[facility] PROGRESS NOTE  Keith [name] TWS:[id number] DOB: 21-Dec-1940 DOA: 08/03/2014 PCP: Georgann Housekeeper, MD  Assessment/Plan: #1 GI bleed/large esophageal varices status post banding 7 per EGD 08/07/2014 Upper GI . Patient with some bleeding 08/06/2014 and subsequently underwent upper endoscopy early morning(08/07/14) which showed large esophageal varices status post banding 7. Also noted was a distal blood non-bulb stricture with some bleeding after scope was passed through.  Patient with bleeding this morning after lactulose enema. Hemoglobin currently at 5.6 from 8.2 yesterday. CT abdomen and pelvis with inflammation in the third portion of the duodenum concerning for duodenal ulcer and also small developing esophageal varices with portal hypertension and changes of cirrhosis noted. Patient currently with an acute encephalopathy that seems to be slowly improving as he opens his eyes to verbal stimuli and is answering questions appropriately. Patient status post EGD. Continue IV PPI and IV octreotide per GI recommendations. Xifaxan has been added per GI. Patient status post 2 units packed red blood cells hemoglobin currently 5.6 from 8.2 from 8.8 from 10.3 from 8.9 from 7.2 on admission. Patient's hemoglobin was 12.1 on 05/13/2013. Will type and screen and transfuse 3 units packed red blood cells. I have notified GI of bleeding this morning and current drop in hemoglobin. Patient will likely need to be rescoped again today. Patient will likely need repeat EGD in about 3-4 weeks for repeat banding per GI. Patient currently on IV Lopressor. When tolerating oral intake will transition to a nonselective beta blocker. Follow H&H. Per GI.  #2 acute blood loss anemia Secondary to #1. Patient with 350 mL of blood after lactulose enema today per nursing. Patient status post 2 units of packed red blood cells. Hemoglobin currently at 5.6 from 8.2 from 8.8 from 9.6 from 10.3 from 8.9 from 7.2 on  admission. Hemoglobin was 12.1 on 05/13/2013. Will type and cross. Transfuse 3 units packed red blood cells. Have notified GI of acute drop in hemoglobin and bleeding this morning. Patient might need to be rescoped. GI following. Follow H&H.  #3 acute encephalopathy/hepatic encephalopathy Likely secondary to hepatic encephalopathy. Improvement with mental status as patient opens eyes to verbal stimuli and answering questions appropriately. Per family patient was able to interact with them yesterday however patient with increased bloody output from NG tube 2 nights ago and subsequently underwent EGD yesterday. CT head negative for any acute abnormalities. Ammonia level was elevated at 132 and is trending down currently at 65 today. Continue lactulose enemas and Xifaxan. Decreased dose of Ativan. Discontinued morphine secondary to worsening ileus.  GI following.  #4 intermittent abdominal pain Appears to be chronic in nature versus now secondary to worsening ileus. CT abdomen and pelvis with inflammation noted in the third portion of the duodenum concerning for duodenal ulcer and also diverticulosis with small developing esophageal varices and portal hypertension with changes of cirrhosis noted. Doubt if patient has SBP. Discontinued IV Rocephin. Discontinue IV narcotics and placed on when necessary IV Tylenol as needed for pain. Follow.  #5 worsening ileus versus small bowel obstruction Questionable etiology. Abdomen is less tight. No BS. Narcotics have been discontinued. Mobilize. Ambulate. Keep potassium greater than 4. Keep magnesium greater than 2.  Unable to place NG tube secondary to large esophageal varices with recent banding. Follow.  #6 cirrhosis Per CT of the abdomen and pelvis. Continue lactulose enemas twice a day. Continue Xifaxan. Will likely need follow-up with GI as outpatient. Will likely need to resume his home regimen of Lasix and spironolactone, once acute  bleeding issues resolved.  Follow for now.  #7 chronic diastolic CHF Stable. 2-D echo from May 2015 with a EF of 60-65% with grade 1 diastolic dysfunction. Lasix and Aldactone on hold for now secondary to problem #1. Follow.  #8 hypertension Diuretics on hold. Continue IV beta blocker.  #9 alcohol abuse Continue the Ativan withdrawal protocol.  #10 hypernatremia NSL IVF. Follow.  #11 prophylaxis PPI for GI prophylaxis. SCDs for DVT prophylaxis.  Code Status: Full Family Communication: Updated daughter and patient at bedside.  Disposition Plan: Remain in step down unit.   Consultants:  Gastroenterology: Dr. Christella Hartigan 08/03/2014  Procedures:  CT head 08/03/14  CT abdomen and pelvis 08/03/2014  Abdominal ultrasound 08/03/2014  2 units packed red blood cells 08/03/2014  PICC line 08/04/2014  Upper endoscopy 08/07/2014 per Dr. Christella Hartigan  Antibiotics:  IV Rocephin 08/03/2014>>>>>> 08/06/2014  HPI/Subjective: Patient more alert today, answering questions appropriately. Patient states passing flatus. Per nursing after enema patient passed 350cc BRBPR.  Objective: Filed Vitals:   08/09/14 0600  BP: 128/64  Pulse: 102  Temp:   Resp: 25    Intake/Output Summary (Last 24 hours) at 08/09/14 0738 Last data filed at 08/09/14 0600  Gross per 24 hour  Intake   2569 ml  Output    995 ml  Net   1574 ml   Filed Weights   08/03/14 0800 08/04/14 0400  Weight: 93 kg (205 lb 0.4 oz) 94.9 kg (209 lb 3.5 oz)    Exam:   General:  Opens eyes to verbal stimuli, answering questions appropriately  Cardiovascular: RRR  Respiratory: CTAB anterior lung fields  Abdomen: less tight, distended, hypoactive BS, no rebound, no guarding.  Musculoskeletal: No clubbing cyanosis, edema  Data Reviewed: Basic Metabolic Panel:  Recent Labs Lab 08/05/14 0620 08/06/14 0500 08/07/14 0400 08/08/14 0434 08/09/14 0630  NA 148* 142 140 135 134*  K 3.5 3.6 4.3 4.2 4.7  CL 112* 113* 111 105 106  CO2 22 24 24 26  25   GLUCOSE 161* 145* 135* 141* 123*  BUN 18 14 10 10  21*  CREATININE 0.68 0.68 0.49* 0.56* 1.03  CALCIUM 7.7* 8.2* 7.9* 7.9* 7.5*  MG 2.1 2.1 2.0 1.8 2.3   Liver Function Tests:  Recent Labs Lab 08/05/14 0620 08/06/14 0500 08/07/14 0400 08/08/14 0434 08/09/14 0630  AST 99* 97* 97* 77* 74*  ALT 42 44 39 38 37  ALKPHOS 95 109 93 83 70  BILITOT 2.3* 2.3* 2.8* 2.7* 2.2*  PROT 6.9 6.6 6.2* 6.1* 5.3*  ALBUMIN 3.0* 2.8* 2.6* 2.5* 2.3*    Recent Labs Lab 08/03/14 1730  LIPASE 24    Recent Labs Lab 08/05/14 0620 08/06/14 0530 08/07/14 0450 08/08/14 0434 08/09/14 0630  AMMONIA 71* 54* 54* 53* 65*   CBC:  Recent Labs Lab 08/05/14 0620 08/06/14 0500 08/07/14 0400 08/08/14 0434 08/09/14 0630  WBC 12.3* 11.6* 11.7* 10.5 12.9*  HGB 10.3* 9.6* 8.8* 8.2* 5.6*  HCT 31.4* 29.6* 27.1* 25.4* 17.2*  MCV 98.1 100.7* 101.5* 100.4* 99.4  PLT 237 205 213 166 173   Cardiac Enzymes: No results for input(s): CKTOTAL, CKMB, CKMBINDEX, TROPONINI in the last 168 hours. BNP (last 3 results)  Recent Labs  08/03/14 1730  BNP 35.6    ProBNP (last 3 results) No results for input(s): PROBNP in the last 8760 hours.  CBG:  Recent Labs Lab 08/03/14 0341 08/03/14 1103 08/05/14 0829 08/08/14 0805  GLUCAP 117* 96 128* 134*    Recent Results (from the past  240 hour(s))  MRSA PCR Screening     Status: None   Collection Time: 08/03/14  8:04 AM  Result Value Ref Range Status   MRSA by PCR NEGATIVE NEGATIVE Final    Comment:        The GeneXpert MRSA Assay (FDA approved for NASAL specimens only), is one component of a comprehensive MRSA colonization surveillance program. It is not intended to diagnose MRSA infection nor to guide or monitor treatment for MRSA infections.   Urine culture     Status: None   Collection Time: 08/04/14  1:08 PM  Result Value Ref Range Status   Specimen Description URINE, CATHETERIZED  Final   Special Requests NONE  Final   Culture   Final     NO GROWTH 2 DAYS Performed at [facility]    Report Status 08/06/2014 FINAL  Final     Studies: Dg Abd 1 View  08/07/2014   CLINICAL DATA:  Follow-up adynamic ileus.  Subsequent encounter.  EXAM: ABDOMEN - 1 VIEW  COMPARISON:  Chest and abdominal radiograph performed 08/06/2014  FINDINGS: Distention of a few small bowel loops is again noted, though air is seen filling the colon. This may reflect mild ileus, as previously noted. There is no evidence for distal obstruction.  No free intra-abdominal air is identified, though evaluation for free air is limited on supine views. The visualized lung bases are grossly clear. No acute osseous abnormalities are identified.  IMPRESSION: Distention of a few small-bowel loops again noted, though air is seen filling the colon. This may reflect mild ileus, as previously noted. No evidence for distal obstruction. No free intra-abdominal air seen.   Electronically Signed   By: Roanna Raider M.D.   On: 08/07/2014 10:27   Dg Abd Portable 1v  08/08/2014   CLINICAL DATA:  Acute generalized abdominal pain.  EXAM: PORTABLE ABDOMEN - 1 VIEW  COMPARISON:  August 07, 2014.  FINDINGS: There appears to be significantly increased small bowel distention concerning for distal small bowel obstruction. No definite colonic dilatation is noted. No abnormal calcifications are noted.  IMPRESSION: Significantly increased small bowel distention is noted concerning for distal small bowel obstruction.   Electronically Signed   By: Lupita Raider, M.D.   On: 08/08/2014 08:52    Scheduled Meds: . sodium chloride   Intravenous Once  . acetaminophen  650 mg Oral Once  . antiseptic oral rinse  7 mL Mouth Rinse BID  . atorvastatin  40 mg Oral q morning - 10a  . diphenhydrAMINE  25 mg Oral Once  . folic acid  1 mg Oral Daily  . furosemide  20 mg Intravenous Once  . furosemide  20 mg Intravenous Once  . lactulose  300 mL Rectal BID  . metoprolol  5 mg Intravenous 3 times per day   . multivitamin with minerals  1 tablet Oral Daily  . pantoprazole (PROTONIX) IV  40 mg Intravenous Q12H  . rifaximin  550 mg Per Tube BID  . sodium chloride  10-40 mL Intracatheter Q12H  . sodium chloride  3 mL Intravenous Q12H  . thiamine  100 mg Oral Daily   Or  . thiamine  100 mg Intravenous Daily   Continuous Infusions: . dextrose 5 % 1,000 mL with potassium chloride 40 mEq infusion 75 mL/hr at 08/08/14 1800  . octreotide  (SANDOSTATIN)    IV infusion 50 mcg/hr (08/08/14 2000)    Principal Problem:   GI bleed Active Problems:  Acute encephalopathy   Encephalopathy, hepatic   Hypertension   Hyperlipidemia   Cardiac arrest   Rectal bleeding   Alcohol abuse   Chronic diastolic CHF (congestive heart failure)   GIB (gastrointestinal bleeding)   Abdominal pain   Hepatic cirrhosis   Acute blood loss anemia   Adynamic ileus   Hypernatremia   Leukocytosis   Esophageal varices in alcoholic cirrhosis   SBO (small bowel obstruction)    Time spent: 40 mins    [facility] MD [facility] Pager [phone]. If 7PM-7AM, please contact night-coverage at [email or web address], password [facility] 08/09/2014, 7:38 AM  LOS: 6 days

## 2014-08-09 NOTE — Progress Notes (Signed)
On call note Called by unit nurse to inform me that pt just passed a bloody stool.  He is hemodynamically stable. Received 3 units PRBCs today.  Plan STAT CBC; continue octreotide drip. EGD in am.

## 2014-08-09 NOTE — Progress Notes (Signed)
Date:  August 09, 2014 U.R. performed for needs and level of care. eoth w/d protocol/continues to bleed and require bld transfusions-hgb 6.5 on [id number]/ plan to re-scope today for bleeding/ lactulose enemas continued for high amnioma  Levels.   Will continue to follow for Case Management needs.  Marcelle Smiling, RN, BSN, [place]   [phone]

## 2014-08-09 NOTE — Progress Notes (Signed)
CRITICAL VALUE ALERT  Critical value received:  hgb 5.6  Date of notification:  08/09/2014  Time of notification:  0620  Critical value read back:Yes.    Nurse who received alert:  queeneth  MD notified (1st page):  schore  Time of first page:  0630  MD notified (2nd page):  Time of second page:  Responding MD:  Janee Morn  Time MD responded:  0700

## 2014-08-09 NOTE — Progress Notes (Signed)
Order received for diagnostic paracentesis, upon reviewing [place] images trace perihepatic ascites and no abdominal quadrant ascites is seen, trace perihepatic ascites is not amendable to percutaneous drainage.   Pattricia Boss PA-C Interventional Radiology  08/09/14  11:02 AM

## 2014-08-09 NOTE — Clinical Documentation Improvement (Signed)
  The patient's creatinine has increased 0.47 mg/dL in 24 hours and 0.54 mg/dL in 48 hours since 08/07/14.  (see trend below).  Patient had a reported 350 ml blood loss following lactulose enema 08/09/14. Component     Latest Ref Rng 08/07/2014 08/08/2014 08/09/2014  BUN     6 - 20 mg/dL $Remove'10 10 21 'pumYdkI$ (H)  Creatinine     0.61 - 1.24 mg/dL 0.49 (L) 0.56 (L) 1.03  EGFR (African American)     >60 mL/min >60 >60 >60   Please document if a condition below provides greater specificity regarding the patient's renal function is the past 48 hours:   - Acute Kidney Injury (AKI) 2/2 ................................  - Other condition 2/2 ........................................  - Unable to clinically determine  Thank You, Erling Conte ,RN Clinical Documentation Specialist:  [phone] [facility] [employer]

## 2014-08-09 NOTE — Progress Notes (Signed)
Wasted morphine with M.Barham RN on 08/07/2014 at 0400 on pt [address] [phone].

## 2014-08-09 NOTE — Progress Notes (Signed)
OT Cancellation Note  Patient Details Name: JAMELE SCHWEDE MRN: [id number] DOB: 1940-10-25   Cancelled Treatment:    Reason Eval/Treat Not Completed: Medical issues which prohibited therapy - Pt with decreased Hbg and active bleeding.  Will defer eval today and reattempt tomorrow as medically appropriate.   Angelene Giovanni Pittman, OTR/L [phone]  08/09/2014, 10:58 AM

## 2014-08-10 ENCOUNTER — Inpatient Hospital Stay (HOSPITAL_COMMUNITY): Payer: Medicare Other

## 2014-08-10 ENCOUNTER — Encounter (HOSPITAL_COMMUNITY)
Admission: EM | Disposition: A | Discharge status: Home Health | Payer: Self-pay | Source: Home / Self Care | Attending: Internal Medicine

## 2014-08-10 ENCOUNTER — Encounter (HOSPITAL_COMMUNITY): Payer: Self-pay

## 2014-08-10 DIAGNOSIS — K567 Ileus, unspecified: Secondary | ICD-10-CM | POA: Insufficient documentation

## 2014-08-10 DIAGNOSIS — K649 Unspecified hemorrhoids: Secondary | ICD-10-CM | POA: Insufficient documentation

## 2014-08-10 DIAGNOSIS — K315 Obstruction of duodenum: Secondary | ICD-10-CM | POA: Insufficient documentation

## 2014-08-10 DIAGNOSIS — E785 Hyperlipidemia, unspecified: Secondary | ICD-10-CM

## 2014-08-10 HISTORY — PX: FLEXIBLE SIGMOIDOSCOPY: SHX5431

## 2014-08-10 HISTORY — PX: ESOPHAGOGASTRODUODENOSCOPY: SHX5428

## 2014-08-10 LAB — TYPE AND SCREEN
ABO/RH(D): O POS
Antibody Screen: NEGATIVE
UNIT DIVISION: 0
UNIT DIVISION: 0
Unit division: 0

## 2014-08-10 LAB — CBC WITH DIFFERENTIAL/PLATELET
BASOS ABS: 0.1 10*3/uL (ref 0.0–0.1)
Basophils Relative: 1 % (ref 0–1)
EOS ABS: 0.4 10*3/uL (ref 0.0–0.7)
Eosinophils Relative: 3 % (ref 0–5)
HCT: 25.6 % — ABNORMAL LOW (ref 39.0–52.0)
Hemoglobin: 8.5 g/dL — ABNORMAL LOW (ref 13.0–17.0)
Lymphocytes Relative: 21 % (ref 12–46)
Lymphs Abs: 2.8 10*3/uL (ref 0.7–4.0)
MCH: 30.8 pg (ref 26.0–34.0)
MCHC: 33.2 g/dL (ref 30.0–36.0)
MCV: 92.8 fL (ref 78.0–100.0)
Monocytes Absolute: 2.4 10*3/uL — ABNORMAL HIGH (ref 0.1–1.0)
Monocytes Relative: 18 % — ABNORMAL HIGH (ref 3–12)
NEUTROS ABS: 8.1 10*3/uL — AB (ref 1.7–7.7)
Neutrophils Relative %: 59 % (ref 43–77)
PLATELETS: 155 10*3/uL (ref 150–400)
RBC: 2.76 MIL/uL — AB (ref 4.22–5.81)
RDW: 18.8 % — ABNORMAL HIGH (ref 11.5–15.5)
WBC: 13.7 10*3/uL — ABNORMAL HIGH (ref 4.0–10.5)

## 2014-08-10 LAB — COMPREHENSIVE METABOLIC PANEL
ALT: 40 U/L (ref 17–63)
AST: 79 U/L — AB (ref 15–41)
Albumin: 2.6 g/dL — ABNORMAL LOW (ref 3.5–5.0)
Alkaline Phosphatase: 81 U/L (ref 38–126)
Anion gap: 5 (ref 5–15)
BUN: 24 mg/dL — ABNORMAL HIGH (ref 6–20)
CALCIUM: 7.6 mg/dL — AB (ref 8.9–10.3)
CO2: 26 mmol/L (ref 22–32)
CREATININE: 0.55 mg/dL — AB (ref 0.61–1.24)
Chloride: 107 mmol/L (ref 101–111)
GFR calc Af Amer: >60 mL/min (ref >60)
Glucose, Bld: 107 mg/dL — ABNORMAL HIGH (ref 65–99)
Potassium: 4 mmol/L (ref 3.5–5.1)
SODIUM: 138 mmol/L (ref 135–145)
TOTAL PROTEIN: 6 g/dL — AB (ref 6.5–8.1)
Total Bilirubin: 6 mg/dL — ABNORMAL HIGH (ref 0.3–1.2)

## 2014-08-10 LAB — URINALYSIS, ROUTINE W REFLEX MICROSCOPIC
Glucose, UA: NEGATIVE mg/dL
Ketones, ur: NEGATIVE mg/dL
NITRITE: NEGATIVE
PH: 6 (ref 5.0–8.0)
PROTEIN: NEGATIVE mg/dL
Specific Gravity, Urine: 1.024 (ref 1.005–1.030)
UROBILINOGEN UA: 1 mg/dL (ref 0.0–1.0)

## 2014-08-10 LAB — URINE MICROSCOPIC-ADD ON

## 2014-08-10 LAB — GLUCOSE, CAPILLARY: GLUCOSE-CAPILLARY: 99 mg/dL (ref 65–99)

## 2014-08-10 LAB — MAGNESIUM: MAGNESIUM: 2.1 mg/dL (ref 1.7–2.4)

## 2014-08-10 LAB — AMMONIA: AMMONIA: 87 umol/L — AB (ref 9–35)

## 2014-08-10 LAB — HEMOGLOBIN: HEMOGLOBIN: 8.4 g/dL — AB (ref 13.0–17.0)

## 2014-08-10 SURGERY — EGD (ESOPHAGOGASTRODUODENOSCOPY)
Anesthesia: Moderate Sedation

## 2014-08-10 MED ORDER — SODIUM CHLORIDE 0.9 % IV SOLN: INTRAVENOUS | Status: DC

## 2014-08-10 MED ORDER — MIDAZOLAM HCL 10 MG/2ML IJ SOLN
INTRAMUSCULAR | Status: DC | PRN
  Administered 2014-08-10: 2 mg via INTRAVENOUS

## 2014-08-10 MED ORDER — FENTANYL CITRATE (PF) 100 MCG/2ML IJ SOLN
INTRAMUSCULAR | Status: DC | PRN
  Administered 2014-08-10: 25 ug via INTRAVENOUS

## 2014-08-10 MED ORDER — LACTULOSE 10 GM/15ML PO SOLN
20.0000 g | Freq: Two times a day (BID) | ORAL | Status: DC
  Administered 2014-08-10 – 2014-08-11 (×2): 20 g via ORAL
  Filled 2014-08-10 (×2): qty 30

## 2014-08-10 MED ORDER — BUTAMBEN-TETRACAINE-BENZOCAINE 2-2-14 % EX AERO
INHALATION_SPRAY | CUTANEOUS | Status: DC | PRN
  Administered 2014-08-10: 2 via TOPICAL

## 2014-08-10 MED ORDER — SODIUM CHLORIDE 0.9 % IV SOLN
INTRAVENOUS | Status: DC
  Administered 2014-08-10: 06:00:00 via INTRAVENOUS

## 2014-08-10 MED ORDER — FENTANYL CITRATE (PF) 100 MCG/2ML IJ SOLN
INTRAMUSCULAR | Status: AC
  Filled 2014-08-10: qty 2

## 2014-08-10 MED ORDER — DIPHENHYDRAMINE HCL 50 MG/ML IJ SOLN
INTRAMUSCULAR | Status: AC
  Filled 2014-08-10: qty 1

## 2014-08-10 MED ORDER — MIDAZOLAM HCL 5 MG/ML IJ SOLN
INTRAMUSCULAR | Status: AC
  Filled 2014-08-10: qty 2

## 2014-08-10 NOTE — Progress Notes (Signed)
OT Cancellation Note  Patient Details Name: ZAYIR GANTERT MRN: [id number] DOB: [birth year]   Cancelled Treatment:    Reason Eval/Treat Not Completed: Other (comment) Note that pt for test today (EGD and sigmoidoscopy) and nursing stating to hold on OOB activity. Will try back another time.  Lennox Laity  [phone] 08/10/2014, 12:18 PM

## 2014-08-10 NOTE — Progress Notes (Signed)
   Overnight events reviewed so I have ordered EGD and sigmoidoscopy for later today  Will also repeat CBC  Will follow-up in person later  Iva Boop, MD, [facility] Gastroenterology [phone] (pager) 08/10/2014 6:19 AM

## 2014-08-10 NOTE — Progress Notes (Signed)
[facility] PROGRESS NOTE  BRYEN HINDERMAN WGN:[id number] DOB: [birth year] DOA: 08/03/2014 PCP: Georgann Housekeeper, MD  Brief interval history Keith [name] is a 74 y.o. male with PMH of hypertension, hyperlipidemia, diastolic congestive heart failure, history of GI bleeding, alcohol abuse, who presented with rectal bleeding, altered mental status and abdominal pain.  Patient had altered mental status, history was obtained from his daughter. Per his daughter, pt has been having intermittent abdominal pain since January 2016. His abdominal pain was located in the lower abdomen, mild to moderate. 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 Patient had dried blood in his shorts per his daughter. Patient reported that he had an episode of rectal bleeding at 2 PM that was dark in color, and another episode around 5 PM. His hemoglobin was 12.1 on 05/13/13, but at the doctor's office on Friday, had dropped to 9. At that time, patient did not have any reported GI bleeding.Per his daughter, patient had negative colonoscopy 6 years ago. Not sure whether patient had any EGD recently. When admitting physician saw patient in emergency room, patient was confused, reporting no diarrhea or abdominal pain.  In ED, patient was found to have hemoglobin drop from 9.0 on Friday to 7.2 today, lactate 1.7, and INR 1.42, temperature 97.2, electrolytes okay, FOBT positive, alcohol level less than 5. Patient was admitted to inpatient for further evaluation and treatment. GI was consulted.  Patient was admitted to  the stepdown unit where he was noted to be obtunded and minimally responsive felt secondary to hepatic encephalopathy. Patient was unable to get lactulose. NG tube was placed and patient received lactulose via NG tube and lactulose enemas. Xifaxan was added. Patient was monitored. EGD was unable to be done initially secondary to patient's altered mental status. Patient was placed empirically on IV Rocephin and received 5 days worth of it for possible SBP. Patient was also placed on IV Lopressor for blood pressure control. Due to patient's altered mental status Ativan which was being used for withdrawal protocol dose was decreased and narcotic dose also decreased. Patient was monitored patient improved slowly and became more alert with both NG tube and lactulose enemas. Patient subsequently underwent upper endoscopy was noted to have large esophageal varices which were banded 7. Patient's hemoglobin was also followed and monitored. Patient subsequently developed an ileus which were sent. IV narcotics were stopped and patient was placed on IV Tylenol for pain control. Ativan doses were decreased. Ammonia levels were followed. Patient's mentation improved however ileus worsened. Oral lactulose was discontinued and lactulose enemas were continued. Serial abdominal films were obtained. Mobilization was recommended. On 08/09/2014 patient had further bleeding after lactulose enemas in the morning and at night. Patient's hemoglobin had dropped to 5.6. Patient was transfused 3 more units of packed red blood cells making a total of 5 units of packed red blood cells transfusion. Patient's ileus was improving with abdomen becoming softer and less distended. Serial abdominal films showed improvement. Patient to go down for repeat upper endoscopy and sigmoidoscopy today per GI.    Assessment/Plan: #1 GI bleed/large esophageal varices status post banding 7 per EGD  08/07/2014 Upper GI . Patient with some bleeding  08/06/2014 and subsequently underwent upper endoscopy early morning(08/07/14) which showed large esophageal varices status post banding 7. Also noted was a distal blood non-bulb stricture with some bleeding after scope was passed through.  Patient with bleeding this morning and last night after lactulose enema. Hemoglobin currently at 8.5 from 5.6 from 8.2 after 3 more units of packed red blood cells. Patient has had a total of 5 units of packed red blood cells during this hospitalization. CT abdomen and pelvis with inflammation in the third portion of the duodenum concerning for duodenal ulcer and also small developing esophageal varices with portal hypertension and changes of cirrhosis noted. Patient currently with an acute encephalopathy that seems to be slowly improving as he opens his eyes to verbal stimuli and is answering questions appropriately. Patient status post EGD. Continue IV PPI and IV octreotide per GI recommendations. Xifaxan has been added per GI. Patient status post 5 units packed red blood cells hemoglobin currently at 8.5 from 5.6 from 8.2 from 8.8 from 10.3 from 8.9 from 7.2 on admission. Patient's hemoglobin was 12.1 on 05/13/2013. GI has been notified of patient's bleeding after enemas yesterday. Patient for repeat EGD today and sigmoidoscopy per GI. Patient will likely need repeat EGD in about 3-4 weeks for repeat banding per GI. Patient currently on IV Lopressor. When tolerating oral intake will transition to a nonselective beta blocker. Follow H&H. Per GI.  #2 acute blood loss anemia Secondary to #1. Patient with 350 mL of blood after lactulose enema yesterday morning as well as last night. Patient status post 5 units of packed red blood cells. Hemoglobin currently at 8.5 from 5.6 from 8.2 from 8.8 from 9.6 from 10.3 from 8.9 from 7.2 on admission. Hemoglobin was 12.1 on 05/13/2013. Repeat CBC pending. Patient is followed by GI and patient for EGD and sigmoidoscopy this morning.   #3  acute encephalopathy/hepatic encephalopathy Likely secondary to hepatic encephalopathy. Improvement with mental status as patient is alert and answering questions appropriately. CT head negative for any acute abnormalities. Ammonia level was elevated at 132 and was trending down however fluctuating and trending back up and currently at 87. Continue lactulose enemas and Xifaxan. Decreased dose of Ativan. Discontinued morphine secondary to worsening ileus.  GI following.  #4 intermittent abdominal pain Appears to be chronic in nature versus now secondary to worsening ileus. CT abdomen and pelvis with inflammation noted in the third portion of the duodenum concerning for duodenal ulcer and also diverticulosis with small developing esophageal varices and portal hypertension with changes of cirrhosis noted. Doubt if patient has SBP. Discontinued IV Rocephin after 5 days. Discontinued IV narcotics and placed on when necessary IV Tylenol as needed for pain. Follow.  #5 worsening ileus versus small bowel obstruction Questionable etiology. Abdomen is softer today although distended. No abdominal pain. Hypoactive bowel sounds. Narcotics have been discontinued. Mobilize. Ambulate. Keep potassium greater than 4. Keep magnesium greater than 2.  Unable to place NG tube secondary to large esophageal varices with recent banding. Abdominal x-ray this morning with improving ileus. Follow.  #6 cirrhosis Per CT of the abdomen and pelvis. Continue lactulose enemas twice a day. Continue Xifaxan. Will likely need follow-up with GI as outpatient. Will likely need to resume his home regimen of Lasix and spironolactone, once acute bleeding issues resolved and patient tolerating oral intake. Follow for now.  #7 chronic diastolic CHF Stable. 2-D echo from May 2015 with a EF of 60-65% with grade  1 diastolic dysfunction. Lasix and Aldactone on hold for now secondary to problem #1. Follow.  #8 hypertension Diuretics on hold.  Continue IV beta blocker.  #9 alcohol abuse Continue the Ativan withdrawal protocol.  #10 hypernatremia NSL IVF. Follow.  #11 leukocytosis Questionable etiology. Will check a UA with cultures and sensitivities. Check a chest x-ray. Check blood cultures 2. Abdominal ileus is improving. We'll hold off on antibiotics at this time. Follow.  #12 nutrition Patient with an ileus which is slowly improving. Patient with large esophageal varices and a such hesitant to place NG tube for nutrition. If patient is not started on a diet in the next 24-48 hours may need to consider parenteral nutrition.  #13 prophylaxis PPI for GI prophylaxis. SCDs for DVT prophylaxis.  Code Status: Full Family Communication: Updated daughter and patient at bedside.  Disposition Plan: Remain in step down unit.   Consultants:  Gastroenterology: Dr. Christella Hartigan 08/03/2014  Procedures:  CT head 08/03/14  CT abdomen and pelvis 08/03/2014  Abdominal ultrasound 08/03/2014, 08/09/2014  2 units packed red blood cells 08/03/2014, 3 units packed red blood cells 08/09/2014  PICC line 08/04/2014  Upper endoscopy 08/07/2014 per Dr. Christella Hartigan  Abdominal x-rays 08/04/2014, 08/05/2014 08/06/2014, 08/07/2014, 08/08/2014, 08/09/2014, 08/10/2014  Antibiotics:  IV Rocephin 08/03/2014>>>>>> 08/06/2014  HPI/Subjective: Patient alert, answering questions appropriately. Patient states passing flatus. Per nursing after enema last night patient passed bright red blood per rectum. None since then. Patient denies any abdominal pain. No nausea. No vomiting.   Objective: Filed Vitals:   08/10/14 0800  BP:   Pulse:   Temp: 98.7 F (37.1 C)  Resp:     Intake/Output Summary (Last 24 hours) at 08/10/14 0846 Last data filed at 08/10/14 0700  Gross per 24 hour  Intake   1580 ml  Output   2700 ml  Net  -1120 ml   Filed Weights   08/03/14 0800 08/04/14 0400 08/10/14 0400  Weight: 93 kg (205 lb 0.4 oz) 94.9 kg (209 lb 3.5 oz)  93 kg (205 lb 0.4 oz)    Exam:   General:  Alert, answering questions appropriately  Cardiovascular: RRR  Respiratory: CTAB anterior lung fields  Abdomen: soft, distended, hypoactive BS, no rebound, no guarding.  Musculoskeletal: No clubbing cyanosis, edema  Data Reviewed: Basic Metabolic Panel:  Recent Labs Lab 08/06/14 0500 08/07/14 0400 08/08/14 0434 08/09/14 0630 08/10/14 0510  NA 142 140 135 134* 138  K 3.6 4.3 4.2 4.7 4.0  CL 113* 111 105 106 107  CO2 GLUCOSE 145* 135* 141* 123* 107*  BUN 21* 24*  CREATININE 0.68 0.49* 0.56* 1.03 0.55*  CALCIUM 8.2* 7.9* 7.9* 7.5* 7.6*  MG 2.1 2.0 1.8 2.3 2.1   Liver Function Tests:  Recent Labs Lab 08/06/14 0500 08/07/14 0400 08/08/14 0434 08/09/14 0630 08/10/14 0510  AST 97* 97* 77* 74* 79*  ALT 44 39 38 37 40  ALKPHOS 109 93 83 70 81  BILITOT 2.3* 2.8* 2.7* 2.2* 6.0*  PROT 6.6 6.2* 6.1* 5.3* 6.0*  ALBUMIN 2.8* 2.6* 2.5* 2.3* 2.6*    Recent Labs Lab 08/03/14 1730  LIPASE 24    Recent Labs Lab 08/06/14 0530 08/07/14 0450 08/08/14 0434 08/09/14 0630 08/10/14 0610  AMMONIA 54* 54* 53* 65* 87*   CBC:  Recent Labs Lab 08/07/14 0400 08/08/14 0434 08/09/14 0630 08/09/14 2056 08/10/14 0510 08/10/14 0832  WBC 11.7* 10.5 12.9* 12.7* 13.7*  --   NEUTROABS  --   --   --   --  8.1*  --   HGB 8.8* 8.2* 5.6* 8.5* 8.5* 8.4*  HCT 27.1* 25.4* 17.2* 26.0* 25.6*  --   MCV 101.5* 100.4* 99.4 91.9 92.8  --   PLT 213 166 173 158 155  --    Cardiac Enzymes: No results for input(s): CKTOTAL, CKMB, CKMBINDEX, TROPONINI in the last 168 hours. BNP (last 3 results)  Recent Labs  08/03/14 1730  BNP 35.6    ProBNP (last 3 results) No results for input(s): PROBNP in the last 8760 hours.  CBG:  Recent Labs Lab 08/03/14 1103 08/05/14 0829 08/08/14 0805 08/09/14 0740 08/10/14 0821  GLUCAP 96 128* 134* 113* 99    Recent Results (from the past 240 hour(s))  MRSA PCR Screening      Status: None   Collection Time: 08/03/14  8:04 AM  Result Value Ref Range Status   MRSA by PCR NEGATIVE NEGATIVE Final    Comment:        The GeneXpert MRSA Assay (FDA approved for NASAL specimens only), is one component of a comprehensive MRSA colonization surveillance program. It is not intended to diagnose MRSA infection nor to guide or monitor treatment for MRSA infections.   Urine culture     Status: None   Collection Time: 08/04/14  1:08 PM  Result Value Ref Range Status   Specimen Description URINE, CATHETERIZED  Final   Special Requests NONE  Final   Culture   Final    NO GROWTH 2 DAYS Performed at [facility]    Report Status 08/06/2014 FINAL  Final     Studies: Dg Abd 1 View  08/10/2014   CLINICAL DATA:  Ileus.  Distended abdomen  EXAM: ABDOMEN - 1 VIEW  COMPARISON:  08/09/2014  FINDINGS: Mild improvement in large and small bowel dilatation. Small amount of gas in the rectum. No soft tissue mass or abnormal calcification.  IMPRESSION: Mild improvement in large and small bowel dilatation compatible with improving ileus.   Electronically Signed   By: Marlan Palau M.D.   On: 08/10/2014 08:10   US Abdomen Limited  08/09/2014   CLINICAL DATA:  Ascites, potential paracentesis  EXAM: LIMITED ABDOMINAL ULTRASOUND  COMPARISON:  CT scan 08/03/2014  FINDINGS: There is only small amount of perihepatic ascites not enough for paracentesis. No ascites is noted in lower quadrants of the abdomen.  IMPRESSION: Only small amount of perihepatic ascites. No ascites is noted in lower quadrants of the abdomen.   Electronically Signed   By: Natasha Mead M.D.   On: 08/09/2014 11:19   Dg Abd Portable 1v  08/09/2014   CLINICAL DATA:  Ileus  EXAM: PORTABLE ABDOMEN - 1 VIEW  COMPARISON:  08/08/2014  FINDINGS: Persistent gaseous distended small bowel loops consistent with significant ileus or partial small bowel obstruction.  IMPRESSION: Persistent gaseous distended small bowel loops  consistent with significant ileus or small bowel obstruction.   Electronically Signed   By: Natasha Mead M.D.   On: 08/09/2014 08:35   Dg Abd Portable 1v  08/08/2014   CLINICAL DATA:  Acute generalized abdominal pain.  EXAM: PORTABLE ABDOMEN - 1 VIEW  COMPARISON:  August 07, 2014.  FINDINGS: There appears to be significantly increased small bowel distention concerning for distal small bowel obstruction. No definite colonic dilatation is noted. No abnormal calcifications are noted.  IMPRESSION: Significantly increased small bowel distention is noted concerning for distal small bowel obstruction.   Electronically Signed   By: Lupita Raider, M.D.   On:  08/08/2014 08:52    Scheduled Meds: . antiseptic oral rinse  7 mL Mouth Rinse BID  . atorvastatin  40 mg Oral q morning - 10a  . folic acid  1 mg Oral Daily  . lactulose  300 mL Rectal BID  . metoprolol  5 mg Intravenous 3 times per day  . multivitamin with minerals  1 tablet Oral Daily  . pantoprazole (PROTONIX) IV  40 mg Intravenous Q12H  . phytonadione  10 mg Subcutaneous Daily  . rifaximin  550 mg Per Tube BID  . sodium chloride  10-40 mL Intracatheter Q12H  . sodium chloride  3 mL Intravenous Q12H  . thiamine  100 mg Oral Daily   Or  . thiamine  100 mg Intravenous Daily   Continuous Infusions: . sodium chloride 20 mL/hr at 08/10/14 0622  . octreotide  (SANDOSTATIN)    IV infusion 50 mcg/hr (08/10/14 0147)    Principal Problem:   GI bleed Active Problems:   Acute encephalopathy   Encephalopathy, hepatic   Hypertension   Hyperlipidemia   Cardiac arrest   Rectal bleeding   Alcohol abuse   Chronic diastolic CHF (congestive heart failure)   GIB (gastrointestinal bleeding)   Abdominal pain   Hepatic cirrhosis   Acute blood loss anemia   Adynamic ileus   Hypernatremia   Leukocytosis   Esophageal varices in alcoholic cirrhosis   SBO (small bowel obstruction)   Esophageal varices with bleeding   Cirrhosis    Time spent: 40  mins    [facility] MD [facility] Pager [phone]. If 7PM-7AM, please contact night-coverage at [email or web address], password [facility] 08/10/2014, 8:46 AM  LOS: 7 days

## 2014-08-10 NOTE — Care Management Note (Signed)
Case Management Note  Patient Details  Name: Keith [name] MRN: [id number] Date of Birth: 04-30-1940  Subjective/Objective:                    Action/Plan:   Expected Discharge Date:   (UNKNOWN)               Expected Discharge Plan:  Home/Self Care  In-House Referral:  NA  Discharge planning Services  CM Consult  Post Acute Care Choice:  NA Choice offered to:  NA  DME Arranged:  N/A DME Agency:  NA  HH Arranged:  RN, PT, OT, Nurse's Aide  And CSW [facility] Agency:  [facility]  Status of Service:  In process, will continue to follow  Medicare Important Message Given:    Date Medicare IM Given:    Medicare IM give by:    Date Additional Medicare IM Given:    Additional Medicare Important Message give by:     If discussed at Long Length of Stay Meetings, dates discussed:    Additional Comments:  Golda Acre, RN 08/10/2014, 2:20 PM

## 2014-08-10 NOTE — Brief Op Note (Signed)
08/03/2014 - 08/10/2014  5:10 PM  PATIENT:  Marilu Favre A Acy  74 y.o. male  PRE-OPERATIVE DIAGNOSIS:  GI bleed - varices  POST-OPERATIVE DIAGNOSIS:   duodunum inflamatory stricture. 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

## 2014-08-10 NOTE — Progress Notes (Signed)
PT Cancellation Note  Patient Details Name: Keith [name] MRN: [id number] DOB: 12-06-1940   Cancelled Treatment:    Reason Eval/Treat Not Completed: Patient at procedure or test/unavailable. Will check back another day. Thanks.    Rebeca Alert, MPT Pager: [phone]

## 2014-08-10 NOTE — Progress Notes (Signed)
[id number] home health care alerted that patient will need RN, PT, OT,AIDE AND CSW  On discharge.  Representative aware via text confirmation.

## 2014-08-11 ENCOUNTER — Encounter (HOSPITAL_COMMUNITY): Payer: Self-pay | Admitting: Internal Medicine

## 2014-08-11 LAB — COMPREHENSIVE METABOLIC PANEL
ALT: 38 U/L (ref 17–63)
ANION GAP: 7 (ref 5–15)
AST: 80 U/L — ABNORMAL HIGH (ref 15–41)
Albumin: 2.5 g/dL — ABNORMAL LOW (ref 3.5–5.0)
Alkaline Phosphatase: 82 U/L (ref 38–126)
BUN: 19 mg/dL (ref 6–20)
CO2: 26 mmol/L (ref 22–32)
CREATININE: 0.58 mg/dL — AB (ref 0.61–1.24)
Calcium: 7.4 mg/dL — ABNORMAL LOW (ref 8.9–10.3)
Chloride: 105 mmol/L (ref 101–111)
GFR calc Af Amer: >60 mL/min (ref >60)
GFR calc non Af Amer: >60 mL/min (ref >60)
Glucose, Bld: 115 mg/dL — ABNORMAL HIGH (ref 65–99)
Potassium: 3.6 mmol/L (ref 3.5–5.1)
Sodium: 138 mmol/L (ref 135–145)
Total Bilirubin: 3.4 mg/dL — ABNORMAL HIGH (ref 0.3–1.2)
Total Protein: 6 g/dL — ABNORMAL LOW (ref 6.5–8.1)

## 2014-08-11 LAB — CBC WITH DIFFERENTIAL/PLATELET
BASOS PCT: 0 % (ref 0–1)
Basophils Absolute: 0 10*3/uL (ref 0.0–0.1)
Eosinophils Absolute: 0.2 10*3/uL (ref 0.0–0.7)
Eosinophils Relative: 2 % (ref 0–5)
HCT: 23.9 % — ABNORMAL LOW (ref 39.0–52.0)
HEMOGLOBIN: 7.9 g/dL — AB (ref 13.0–17.0)
Lymphocytes Relative: 19 % (ref 12–46)
Lymphs Abs: 2 10*3/uL (ref 0.7–4.0)
MCH: 31.1 pg (ref 26.0–34.0)
MCHC: 33.1 g/dL (ref 30.0–36.0)
MCV: 94.1 fL (ref 78.0–100.0)
Monocytes Absolute: 2.1 10*3/uL — ABNORMAL HIGH (ref 0.1–1.0)
Monocytes Relative: 21 % — ABNORMAL HIGH (ref 3–12)
NEUTROS PCT: 58 % (ref 43–77)
Neutro Abs: 5.9 10*3/uL (ref 1.7–7.7)
Platelets: 141 10*3/uL — ABNORMAL LOW (ref 150–400)
RBC: 2.54 MIL/uL — ABNORMAL LOW (ref 4.22–5.81)
RDW: 18.4 % — ABNORMAL HIGH (ref 11.5–15.5)
WBC: 10.3 10*3/uL (ref 4.0–10.5)

## 2014-08-11 LAB — AMMONIA: AMMONIA: 65 umol/L — AB (ref 9–35)

## 2014-08-11 LAB — GLUCOSE, CAPILLARY: Glucose-Capillary: 103 mg/dL — ABNORMAL HIGH (ref 65–99)

## 2014-08-11 LAB — PROTIME-INR
INR: 1.42 (ref 0.00–1.49)
PROTHROMBIN TIME: 17.5 s — AB (ref 11.6–15.2)

## 2014-08-11 MED ORDER — LACTULOSE 10 GM/15ML PO SOLN
20.0000 g | Freq: Three times a day (TID) | ORAL | Status: DC
  Administered 2014-08-11 – 2014-08-16 (×16): 20 g via ORAL
  Filled 2014-08-11 (×16): qty 30

## 2014-08-11 MED ORDER — LORAZEPAM 2 MG/ML IJ SOLN
0.5000 mg | Freq: Once | INTRAMUSCULAR | Status: AC | PRN
  Administered 2014-08-11: 0.5 mg via INTRAVENOUS

## 2014-08-11 MED ORDER — LORAZEPAM 2 MG/ML IJ SOLN
INTRAMUSCULAR | Status: AC
  Filled 2014-08-11: qty 1

## 2014-08-11 MED ORDER — QUETIAPINE FUMARATE 50 MG PO TABS
25.0000 mg | ORAL_TABLET | Freq: Three times a day (TID) | ORAL | Status: DC
  Administered 2014-08-11 – 2014-08-13 (×7): 25 mg via ORAL
  Filled 2014-08-11 (×8): qty 1

## 2014-08-11 MED ORDER — RIFAXIMIN 550 MG PO TABS
550.0000 mg | ORAL_TABLET | Freq: Two times a day (BID) | ORAL | Status: DC
  Administered 2014-08-11 – 2014-08-16 (×11): 550 mg via ORAL
  Filled 2014-08-11 (×13): qty 1

## 2014-08-11 MED ORDER — HALOPERIDOL LACTATE 5 MG/ML IJ SOLN
1.0000 mg | Freq: Four times a day (QID) | INTRAMUSCULAR | Status: DC | PRN
  Filled 2014-08-11: qty 1

## 2014-08-11 MED ORDER — LORAZEPAM 2 MG/ML IJ SOLN
0.5000 mg | Freq: Once | INTRAMUSCULAR | Status: AC | PRN
  Administered 2014-08-11: 0.5 mg via INTRAVENOUS
  Filled 2014-08-11: qty 1

## 2014-08-11 MED ORDER — HALOPERIDOL 0.5 MG PO TABS
0.5000 mg | ORAL_TABLET | Freq: Three times a day (TID) | ORAL | Status: DC | PRN
  Filled 2014-08-11: qty 1

## 2014-08-11 NOTE — Evaluation (Signed)
Occupational Therapy Evaluation Patient Details Name: Keith [name] MRN: [id number] DOB: 1940/11/13 Today's Date: 08/11/2014    History of Present Illness Keith [name] is a 74 y.o. male with PMH of hypertension, hyperlipidemia, diastolic congestive heart failure, history of GI bleeding, alcohol abuse, who presents with rectal bleeding, altered mental status and abdominal pain; He has SBO of unknown etiology; Pt has been unable to mobilize d/t sedation (d/t hx of ETOH), he can't have NGT d/t  esophageal varices pe rdiscussion wtih MD;    Clinical Impression   This 74 year old man was admitted for the above.  At baseline, he is independent with adls.  Currently, he needs total A x 1-2 person assist due to cognition. Will follow in acute with min to mod A.      Follow Up Recommendations  Home health OT (family does not want SNF)    Equipment Recommendations  3 in 1 bedside comode    Recommendations for Other Services       Precautions / Restrictions Precautions Precautions: Fall Restrictions Weight Bearing Restrictions: No      Mobility Bed Mobility Overal bed mobility: Needs Assistance Bed Mobility: Supine to Sit;Sit to Supine     Supine to sit: Max assist;+2 for physical assistance;+2 for safety/equipment;HOB elevated Sit to supine: +2 for physical assistance;+2 for safety/equipment;Total assist   General bed mobility comments: Assist for trunk and bil LEs. Max multimodal cueing for safety, technique. Pt initially resistant to returning to supine so fully assisted him back into bed.   Transfers Overall transfer level: Needs assistance   Transfers: Sit to/from Stand Sit to Stand: Mod assist;+2 physical assistance;+2 safety/equipment;From elevated surface         General transfer comment: Assist to rise, stabilize, control descent.  Multimodal cueing for safety, technique. Stood EOB ~5 minutes total. Fully assisted back into sitting due to pt  resistant    Balance Overall balance assessment: Needs assistance Sitting-balance support: Feet supported Sitting balance-Leahy Scale: Poor Sitting balance - Comments: Max assist for sitting balance. pt leaning posteriorly and scooting bottome towards EOB despite cueing.  Postural control: Posterior lean Standing balance support: Bilateral upper extremity supported;During functional activity Standing balance-Leahy Scale: Poor                              ADL Overall ADL's : Needs assistance/impaired                                       General ADL Comments: pt needs total A for ADLs, +1 for UB and +2 for LB, sit to stand.  Able to stand and sidestep with A x 2 (mod) and multimodal cues     Vision     Perception     Praxis      Pertinent Vitals/Pain Pain Assessment: No/denies pain     Hand Dominance     Extremity/Trunk Assessment Upper Extremity Assessment Upper Extremity Assessment: Difficult to assess due to impaired cognition           Communication Communication Communication: No difficulties   Cognition Arousal/Alertness: Awake/alert Behavior During Therapy: Impulsive Overall Cognitive Status: Impaired/Different from baseline Area of Impairment: Orientation;Attention;Memory;Following commands;Safety/judgement;Awareness;Problem solving Orientation Level: Disoriented to;Place;Time;Situation Current Attention Level: Focused Memory: Decreased short-term memory;Decreased recall of precautions Following Commands: Follows one step commands inconsistently Safety/Judgement: Decreased awareness of safety;Decreased awareness  of deficits   Problem Solving: Slow processing;Decreased initiation;Difficulty sequencing;Requires verbal cues;Requires tactile cues General Comments: pt talked a lot--disoriented.  Followed some commands, multimodal cues given   General Comments       Exercises       Shoulder Instructions      Home Living  Family/patient expects to be discharged to:: Private residence Living Arrangements: Children                 Bathroom Shower/Tub: Producer, television/film/video: Standard         Additional Comments: will go to daughter's and they will hire an agency to assist      Prior Functioning/Environment Level of Independence: Independent             OT Diagnosis: Generalized weakness   OT Problem List: Decreased strength;Decreased activity tolerance;Impaired balance (sitting and/or standing);Decreased cognition;Decreased safety awareness   OT Treatment/Interventions: Self-care/ADL training;DME and/or AE instruction;Patient/family education;Balance training;Therapeutic activities;Cognitive remediation/compensation    OT Goals(Current goals can be found in the care plan section) Acute Rehab OT Goals Patient Stated Goal: family:  to take pt home; get him walking OT Goal Formulation: With patient/family Time For Goal Achievement: 08/25/14 Potential to Achieve Goals: Good ADL Goals Pt Will Transfer to Toilet: with min assist;with +2 assist;ambulating;bedside commode Pt Will Perform Toileting - Clothing Manipulation and hygiene: sit to/from stand;with mod assist Additional ADL Goal #1: pt will complete UB adls with supervision and set up, sitting with mod cues Additional ADL Goal #2: pt will complete LB adls with mod A, sit to stand, given mod cues Additional ADL Goal #3: pt will consistently follow one step commands in quiet environment with multimodal cues  OT Frequency: Min 2X/week   Barriers to D/C:            Co-evaluation PT/OT/SLP Co-Evaluation/Treatment: Yes Reason for Co-Treatment: For patient/therapist safety PT goals addressed during session: Mobility/safety with mobility OT goals addressed during session: ADL's and self-care      End of Session    Activity Tolerance: Patient tolerated treatment well Patient left: in bed;with call bell/phone within  reach;with bed alarm set;with family/visitor present;with nursing/sitter in room   Time: 1045-1106 OT Time Calculation (min): 21 min Charges:  OT General Charges $OT Visit: 1 Procedure OT Evaluation $Initial OT Evaluation Tier I: 1 Procedure G-Codes:    Keith [name] 09-03-2014, 12:06 PM Keith [name], OTR/L [phone] 09-03-14

## 2014-08-11 NOTE — Progress Notes (Signed)
MD paged due to pts. Bloody BM: large, watery, maroon. Last hgb 7.9. V/S currently stable; BP 140/63, HR 90's. Will continue to monitor pt.

## 2014-08-11 NOTE — Progress Notes (Signed)
Physical Therapy Treatment Patient Details Name: Keith [name] MRN: [id number] DOB: [birth year] Today's Date: 08/11/2014    History of Present Illness Keith [name] is a 74 y.o. male with PMH of hypertension, hyperlipidemia, diastolic congestive heart failure, history of GI bleeding, alcohol abuse, who presents with rectal bleeding, altered mental status and abdominal pain; He has SBO of unknown etiology; Pt has been unable to mobilize d/t sedation (d/t hx of ETOH), he can't have NGT d/t  esophageal varices pe rdiscussion wtih MD;     PT Comments    Progressing slowly with mobility. Pt remains very much cognitively impaired at this time. Follows commands inconsistently and requires max cueing. Continues to require +2 assist for mobility. Will continue to follow and progress mobility/activity as safely able.   Follow Up Recommendations  SNF;Supervision/Assistance - 24 hour (family refusing SNF so pt will need HHPT. )     Equipment Recommendations  Rolling walker with 5" wheels (possibly-continuing to assess)    Recommendations for Other Services       Precautions / Restrictions Precautions Precautions: Fall Restrictions Weight Bearing Restrictions: No    Mobility  Bed Mobility Overal bed mobility: Needs Assistance Bed Mobility: Supine to Sit;Sit to Supine     Supine to sit: Max assist;+2 for physical assistance;+2 for safety/equipment;HOB elevated Sit to supine: +2 for physical assistance;+2 for safety/equipment;Total assist   General bed mobility comments: Assist for trunk and bil LEs. Max multimodal cueing for safety, technique. Pt initially resistant/not following commands to returning to supine so fully assisted him back into bed.   Transfers Overall transfer level: Needs assistance   Transfers: Sit to/from Stand Sit to Stand: Mod assist;+2 physical assistance;+2 safety/equipment;From elevated surface         General transfer comment: Assist to rise,  stabilize, control descent.  Multimodal cueing for safety, technique. Stood EOB ~5 minutes total. Fully assisted back into sitting due to pt resistant/not following commands  Ambulation/Gait             General Gait Details: Max cueing and assist for side steps toward [facility] with RW. Not safe to attempt ambulation on today   Stairs            Wheelchair Mobility    Modified Rankin (Stroke Patients Only)       Balance Overall balance assessment: Needs assistance Sitting-balance support: Feet supported Sitting balance-Leahy Scale: Poor Sitting balance - Comments: Max assist for sitting balance. pt leaning posteriorly and scooting bottome towards EOB despite cueing.  Postural control: Posterior lean Standing balance support: Bilateral upper extremity supported;During functional activity Standing balance-Leahy Scale: Poor                      Cognition Arousal/Alertness: Awake/alert Behavior During Therapy: Impulsive Overall Cognitive Status: Impaired/Different from baseline Area of Impairment: Orientation;Attention;Memory;Following commands;Safety/judgement;Awareness;Problem solving Orientation Level: Disoriented to;Place;Time;Situation Current Attention Level: Focused Memory: Decreased short-term memory;Decreased recall of precautions Following Commands: Follows one step commands inconsistently Safety/Judgement: Decreased awareness of safety;Decreased awareness of deficits   Problem Solving: Slow processing;Decreased initiation;Difficulty sequencing;Requires verbal cues;Requires tactile cues General Comments: pt talked a lot--disoriented.  Followed some commands, multimodal cues given    Exercises      General Comments        Pertinent Vitals/Pain Pain Assessment: No/denies pain    Home Living Family/patient expects to be discharged to:: Private residence Living Arrangements: Children             Additional Comments: will go  to daughter's and  they will hire an agency to assist    Prior Function Level of Independence: Independent          PT Goals (current goals can now be found in the care plan section) Acute Rehab PT Goals Patient Stated Goal: family:  to take pt home; get him walking PT Goal Formulation: Patient unable to participate in goal setting Time For Goal Achievement: 08/25/14 Potential to Achieve Goals: Good Progress towards PT goals: Progressing toward goals    Frequency  Min 3X/week    PT Plan Current plan remains appropriate    Co-evaluation   Reason for Co-Treatment: For patient/therapist safety PT goals addressed during session: Mobility/safety with mobility OT goals addressed during session: ADL's and self-care     End of Session Equipment Utilized During Treatment: Gait belt Activity Tolerance: Patient tolerated treatment well (Limited by cognition) Patient left: in bed;with call bell/phone within reach;with bed alarm set;with family/visitor present     Time: 1045-1106 PT Time Calculation (min) (ACUTE ONLY): 21 min  Charges:  $Therapeutic Activity: 8-22 mins                    G Codes:      Rebeca Alert, MPT Pager: [phone]

## 2014-08-11 NOTE — Progress Notes (Signed)
[facility] PROGRESS NOTE  RALLY OUCH UJW:[id number] DOB: [birth year] DOA: 08/03/2014 PCP: Georgann Housekeeper, MD  Assessment/Plan: #1 GI bleed/large esophageal varices status post banding 7 per EGD 08/07/2014 Upper GI . Patient with some bleeding 08/06/2014 and subsequently underwent upper endoscopy early morning(08/07/14) which showed large esophageal varices status post banding 7. Also noted was a distal blood non-bulb stricture with some bleeding after scope was passed through.   - GI on board and no blood noted on further evaluation by GI - Continue to monitor hgb levels.  #2 acute blood loss anemia - Secondary to #1, GI on board. Continue monitor H&H as listed above - If active bleeding will plan on transfusing, but no active bleeding currently as such will transfuse if hemoglobin less than 7.0  #3 acute encephalopathy/hepatic encephalopathy Likely secondary to hepatic encephalopathy.  - Reportedly patient has not been taken his rifaximin - GI considering increasing lactulose  #4 intermittent abdominal pain Appears to be chronic in nature versus now secondary to worsening ileus. CT abdomen and pelvis with inflammation noted in the third portion of the duodenum concerning for duodenal ulcer and also diverticulosis with small developing esophageal varices and portal hypertension with changes of cirrhosis noted. Doubt if patient has SBP. Discontinued IV Rocephin. Discontinue IV narcotics and placed on when necessary IV Tylenol as needed for pain. .  #5 worsening ileus versus small bowel obstruction Questionable etiology. Abdomen is less tight. No BS. Narcotics have been discontinued.  - Will consult PT for further evaluation and to mobilize with should help with ileus.  #6 cirrhosis Per CT of the abdomen and pelvis. Continue lactulose enemas twice a day. Continue Xifaxan. - GI on board and assisting  #7 chronic diastolic CHF Stable. 2-D echo from May 2015 with a EF of  60-65% with grade 1 diastolic dysfunction. Lasix and Aldactone on hold for now secondary to problem #1. Follow.  #8 hypertension Diuretics on hold. Continue IV beta blocker.  #9 alcohol abuse Continue the Ativan withdrawal protocol. Patient has not had drink for days maybe even a week per my discussion with family. - given continued agitation consulted psychiatry to assist with symptom management.  #10 hypernatremia NSL IVF. Follow.  #11 prophylaxis PPI for GI prophylaxis. SCDs for DVT prophylaxis.  Code Status: Full Family Communication: Updated daughter and patient at bedside.  Disposition Plan: Remain in step down unit.   Consultants:  Gastroenterology: Dr. Christella Hartigan 08/03/2014  Procedures:  CT head 08/03/14  CT abdomen and pelvis 08/03/2014  Abdominal ultrasound 08/03/2014  2 units packed red blood cells 08/03/2014  PICC line 08/04/2014  Upper endoscopy 08/07/2014 per Dr. Christella Hartigan  Antibiotics:  IV Rocephin 08/03/2014>>>>>> 08/06/2014  HPI/Subjective: Pt has no new complaints. Still confused, daughter thinks patient is more confuse today.  Objective: Filed Vitals:   08/11/14 1213  BP:   Pulse:   Temp: 99 F (37.2 C)  Resp:     Intake/Output Summary (Last 24 hours) at 08/11/14 1343 Last data filed at 08/11/14 1100  Gross per 24 hour  Intake 1037.5 ml  Output   1150 ml  Net -112.5 ml   Filed Weights   08/03/14 0800 08/04/14 0400 08/10/14 0400  Weight: 93 kg (205 lb 0.4 oz) 94.9 kg (209 lb 3.5 oz) 93 kg (205 lb 0.4 oz)    Exam:   General:  Pt alert and oriented x 1 to person  Cardiovascular: RRR, no rubs  Respiratory: CTAB anterior lung fields, no wheezes  Abdomen: less tight, distended, hypoactive  BS, no rebound, no guarding.  Musculoskeletal: No clubbing cyanosis, edema  Data Reviewed: Basic Metabolic Panel:  Recent Labs Lab 08/06/14 0500 08/07/14 0400 08/08/14 0434 08/09/14 0630 08/10/14 0510 08/11/14 0545  NA 142 140 135 134*  138 138  K 3.6 4.3 4.2 4.7 4.0 3.6  CL 113* 111 105 106 107 105  CO2 24 24 26 25 26 26   GLUCOSE 145* 135* 141* 123* 107* 115*  BUN 14 10 10  21* 24* 19  CREATININE 0.68 0.49* 0.56* 1.03 0.55* 0.58*  CALCIUM 8.2* 7.9* 7.9* 7.5* 7.6* 7.4*  MG 2.1 2.0 1.8 2.3 2.1  --    Liver Function Tests:  Recent Labs Lab 08/07/14 0400 08/08/14 0434 08/09/14 0630 08/10/14 0510 08/11/14 0545  AST 97* 77* 74* 79* 80*  ALT 39 38 37 40 38  ALKPHOS 93 83 70 81 82  BILITOT 2.8* 2.7* 2.2* 6.0* 3.4*  PROT 6.2* 6.1* 5.3* 6.0* 6.0*  ALBUMIN 2.6* 2.5* 2.3* 2.6* 2.5*   No results for input(s): LIPASE, AMYLASE in the last 168 hours.  Recent Labs Lab 08/07/14 0450 08/08/14 0434 08/09/14 0630 08/10/14 0610 08/11/14 0621  AMMONIA 54* 53* 65* 87* 65*   CBC:  Recent Labs Lab 08/08/14 0434 08/09/14 0630 08/09/14 2056 08/10/14 0510 08/10/14 0832 08/11/14 0545  WBC 10.5 12.9* 12.7* 13.7*  --  10.3  NEUTROABS  --   --   --  8.1*  --  5.9  HGB 8.2* 5.6* 8.5* 8.5* 8.4* 7.9*  HCT 25.4* 17.2* 26.0* 25.6*  --  23.9*  MCV 100.4* 99.4 91.9 92.8  --  94.1  PLT 166 173 158 155  --  141*   Cardiac Enzymes: No results for input(s): CKTOTAL, CKMB, CKMBINDEX, TROPONINI in the last 168 hours. BNP (last 3 results)  Recent Labs  08/03/14 1730  BNP 35.6    ProBNP (last 3 results) No results for input(s): PROBNP in the last 8760 hours.  CBG:  Recent Labs Lab 08/05/14 0829 08/08/14 0805 08/09/14 0740 08/10/14 0821 08/11/14 0824  GLUCAP 128* 134* 113* 99 103*    Recent Results (from the past 240 hour(s))  MRSA PCR Screening     Status: None   Collection Time: 08/03/14  8:04 AM  Result Value Ref Range Status   MRSA by PCR NEGATIVE NEGATIVE Final    Comment:        The GeneXpert MRSA Assay (FDA approved for NASAL specimens only), is one component of a comprehensive MRSA colonization surveillance program. It is not intended to diagnose MRSA infection nor to guide or monitor treatment  for MRSA infections.   Urine culture     Status: None   Collection Time: 08/04/14  1:08 PM  Result Value Ref Range Status   Specimen Description URINE, CATHETERIZED  Final   Special Requests NONE  Final   Culture   Final    NO GROWTH 2 DAYS Performed at [facility]    Report Status 08/06/2014 FINAL  Final  Culture, Urine     Status: None (Preliminary result)   Collection Time: 08/10/14  2:04 PM  Result Value Ref Range Status   Specimen Description URINE, CATHETERIZED  Final   Special Requests NONE  Final   Culture   Final    NO GROWTH < 24 HOURS Performed at [facility]    Report Status PENDING  Incomplete     Studies: Dg Abd 1 View  08/10/2014   CLINICAL DATA:  Ileus.  Distended abdomen  EXAM: ABDOMEN - 1 VIEW  COMPARISON:  08/09/2014  FINDINGS: Mild improvement in large and small bowel dilatation. Small amount of gas in the rectum. No soft tissue mass or abnormal calcification.  IMPRESSION: Mild improvement in large and small bowel dilatation compatible with improving ileus.   Electronically Signed   By: Marlan Palau M.D.   On: 08/10/2014 08:10   Dg Chest Port 1 View  08/10/2014   CLINICAL DATA:  Leukocytosis, CHF  EXAM: PORTABLE CHEST - 1 VIEW  COMPARISON:  08/06/2014  FINDINGS: Cardiac shadow is stable. The nasogastric catheter is been removed. A right-sided PICC line is again identified in the proximal superior vena cava. The lungs are well aerated bilaterally. Some very minimal linear atelectasis is noted in the bases bilaterally and stable. No focal confluent infiltrate is seen.  IMPRESSION: Interval removal of nasogastric catheter. Bibasilar atelectatic changes are again seen.   Electronically Signed   By: Alcide Clever M.D.   On: 08/10/2014 10:01    Scheduled Meds: . atorvastatin  40 mg Oral q morning - 10a  . folic acid  1 mg Oral Daily  . lactulose  20 g Oral TID  . metoprolol  5 mg Intravenous 3 times per day  . multivitamin with minerals  1 tablet  Oral Daily  . pantoprazole (PROTONIX) IV  40 mg Intravenous Q12H  . rifaximin  550 mg Oral BID  . sodium chloride  10-40 mL Intracatheter Q12H  . sodium chloride  3 mL Intravenous Q12H  . thiamine  100 mg Oral Daily   Or  . thiamine  100 mg Intravenous Daily   Continuous Infusions:    Principal Problem:   Alcohol abuse Active Problems:   Hypertension   Hyperlipidemia   Cardiac arrest   Acute encephalopathy   Rectal bleeding   Chronic diastolic CHF (congestive heart failure)   GIB (gastrointestinal bleeding)   Abdominal pain   GI bleed   Encephalopathy, hepatic   Hepatic cirrhosis   Acute blood loss anemia   Adynamic ileus   Hypernatremia   Leukocytosis   Esophageal varices in alcoholic cirrhosis   SBO (small bowel obstruction)   Esophageal varices with bleeding   Cirrhosis   Ileus   Rectal varices   Stricture, duodenum    Time spent: 30 mins    Penny Pia MD [facility] Pager [phone] If 7PM-7AM, please contact night-coverage at [email or web address], password [facility] 08/11/2014, 1:43 PM  LOS: 8 days

## 2014-08-11 NOTE — Consult Note (Signed)
[facility] Face-to-Face Psychiatry Consult   Reason for Consult:  Alcohol abuse vs dependence with multiple medical complications and acute encephalopathy and gastric bleeding. Referring Physician:  Dr. Wendee Beavers Patient Identification: Keith [name] MRN:  [id number] Principal Diagnosis: Alcohol abuse Diagnosis:   Patient Active Problem List   Diagnosis Date Noted  . Ileus [K56.7]   . 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 Cirrhosis [K74.60]   . SBO (small bowel obstruction) [K56.69] 08/08/2014  . Leukocytosis [D72.829]   . 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 Adynamic ileus [K56.0] 08/05/2014  . Hypernatremia [E87.0] 08/05/2014  . GI bleed [K92.2] 08/04/2014  . Encephalopathy, hepatic [K72.90] 08/04/2014  . Hepatic cirrhosis [K74.60] 08/04/2014  . Acute blood loss anemia [D62]   . Lower GI bleed [K92.2] 08/03/2014  . Acute encephalopathy [G93.40] 08/03/2014  . Rectal bleeding [K62.5] 08/03/2014  . Alcohol abuse [F10.10] 08/03/2014  . GIB (gastrointestinal bleeding) [K92.2] 08/03/2014  . Chronic diastolic CHF (congestive heart failure) [I50.32]   . Abdominal pain [R10.9]   . Atrial fibrillation [I48.91] 07/15/2012  . Fever, unspecified [R50.9] 06/17/2012  . Airway obstruction/recurrent [J98.8] 06/14/2012  . 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 Fracture of rib of right side [S22.31XA] 06/14/2012  . Fracture, sternum closed [S22.20XA] 06/14/2012  . Cardiac arrest [I46.9] 05/31/2012  . Bleeding [R58] 05/31/2012  . Tracheostomy complication [X91.47] 82/95/6213  . Fracture of multiple ribs [S22.49XA] 05/31/2012  . Sternal fracture [S22.20XA] 05/31/2012  . Acute respiratory failure [J96.00] 05/31/2012  . Laryngeal edema [J38.4] 05/22/2012  . Hypertension [I10] 05/22/2012  . Hyperlipidemia [E78.5] 05/22/2012  . Habitual alcohol use [F10.10] 05/22/2012    Total Time spent with  patient: 1 hour  Subjective:   Keith [name] is a 74 y.o. male patient admitted with alcohol abuse, rectal bleeding and agitation during last night.  HPI:  Keith [name] is a 74 y.o. male seen face-to-face for psychiatric consultation and evaluation along with the psychiatric social service and also reviewed available medical records. Reportedly patient become irritable as stated and trying to get out of bed last night and required more medication management. Patient daughter who is at bedside who is able to provide collateral information in this case. Reportedly patient suffered with multiple medical problems including gastrointestinal bleeding. Patient is not a reliable historian because of confusion at this time. Patient daughter reported that he has been drinking liquor regularly over several years. Patient has no interest in quitting drinking alcohol. Patient daughter stated she moved into his home to care for him about 3 years ago secondary to knee become physically sick and disabled at that time and also required tracheostomy. Patient has no previous history of acute psychiatric hospitalization. Patient denies current symptoms of depression, anxiety, mania, psychosis. Patient has no current suicidal/homicidal ideation, intention or plans. Patient stated that he cannot drink anymore because of his current medical condition and patient daughter stated that they are looking for 24-hour supervision and home health care at home when medically stable.  HPI Elements:   Location:  Alcohol abuse. Quality:  Poor. Severity:  Acute encephalopathy. Timing:  Increase drinking. Duration:  Chronic. Context:  Psychosocial and substance abuse.  Past Medical History:  Past Medical History  Diagnosis Date  . HTN (hypertension)   . Hypercholesterolemia   . Alcohol abuse 2014  . A-fib 05/2012    in the setting of acute medical illness/ respiratory arrest. Not  placed on anticoagulant.   . History  of GI bleed ~ 1990    bleeding from ulcer  . History of cardiac arrest 05/2012    due to tracheostomy site bleeding and airway obstruction  . Angioedema 05/2012    with acute resp failure and hypoxia due to Lisinopril  . Respiratory arrest 05/2012    caused by tracheostomy site bleeding and subsequent airway obstruciton.   . Anemia 05/2012, 07/2014.     normocytic 2014, macrocytic 2016  . Cirrhosis 07/2014    Past Surgical History  Procedure Laterality Date  . Tracheostomy  05/22/12    emergent, angioedema  . Tracheostomy tube placement N/A 05/31/2012    Procedure: TRACHEOSTOMY;  Surgeon: Jodi Marble, MD;  Location: [place];  Service: ENT;  Laterality: N/A;  . Laryngoscopy N/A 05/31/2012    Procedure: LARYNGOSCOPY;  Surgeon: Jodi Marble, MD;  Location: [place];  Service: ENT;  Laterality: N/A;  . Laryngoscopy N/A 06/19/2012    Procedure: DIRECT LARYNGOSCOPY AND BRONCHOSCOPY;  Surgeon: Ruby Cola, MD;  Location: [place];  Service: ENT;  Laterality: N/A;  . Tracheostomy revision N/A 06/19/2012    Procedure: TRACHEOSTOMY DECANNULATION;  Surgeon: Ruby Cola, MD;  Location: [place];  Service: ENT;  Laterality: N/A;  [place]  . Esophagogastroduodenoscopy N/A 08/07/2014    Procedure: ESOPHAGOGASTRODUODENOSCOPY (EGD);  Surgeon: Milus Banister, MD;  Location: [facility];  Service: Endoscopy;  Laterality: N/A;  . Esophagogastroduodenoscopy N/A 08/10/2014    Procedure: ESOPHAGOGASTRODUODENOSCOPY (EGD);  Surgeon: Gatha Mayer, MD;  Location: [facility];  Service: Endoscopy;  Laterality: N/A;  bedside   . Flexible sigmoidoscopy N/A 08/10/2014    Procedure: FLEXIBLE SIGMOIDOSCOPY;  Surgeon: Gatha Mayer, MD;  Location: [facility];  Service: Endoscopy;  Laterality: N/A;  bedside   Family History:  Family History  Problem Relation Age of Onset  . Breast cancer Mother   . Stroke Father    Social History:  History  Alcohol Use  . 0.0 oz/week  . 0 Standard drinks  or equivalent per week    Comment: drinks daily. in 07/2012 drinking of 16-24 oz vodka daily reported.      History  Drug Use No    History   Social History  . Marital Status: Single    Spouse Name: N/A  . Number of Children: N/A  . Years of Education: N/A   Social History Main Topics  . Smoking status: Never Smoker   . Smokeless tobacco: Not on file  . Alcohol Use: 0.0 oz/week    0 Standard drinks or equivalent per week     Comment: drinks daily. in 07/2012 drinking of 16-24 oz vodka daily reported.   . Drug Use: No  . Sexual Activity: Not on file   Other Topics Concern  . None   Social History Narrative   Additional Social History:                          Allergies:   Allergies  Allergen Reactions  . Lisinopril Other (See Comments)    Presented with laryngeal edema - likely caused is lisinopril   . Amlodipine Swelling    Labs:  Results for orders placed or performed during the hospital encounter of 08/03/14 (from the past 48 hour(s))  CBC     Status: Abnormal   Collection Time: 08/09/14  8:56 PM  Result Value Ref Range   WBC 12.7 (H) 4.0 - 10.5 K/uL  RBC 2.83 (L) 4.22 - 5.81 MIL/uL   Hemoglobin 8.5 (L) 13.0 - 17.0 g/dL    Comment: DELTA CHECK NOTED REPEATED TO VERIFY POST TRANSFUSION SPECIMEN    HCT 26.0 (L) 39.0 - 52.0 %   MCV 91.9 78.0 - 100.0 fL    Comment: DELTA CHECK NOTED POST TRANSFUSION SPECIMEN REPEATED TO VERIFY    MCH 30.0 26.0 - 34.0 pg   MCHC 32.7 30.0 - 36.0 g/dL   RDW 18.3 (H) 11.5 - 15.5 %   Platelets 158 150 - 400 K/uL  CBC with Differential/Platelet     Status: Abnormal   Collection Time: 08/10/14  5:10 AM  Result Value Ref Range   WBC 13.7 (H) 4.0 - 10.5 K/uL   RBC 2.76 (L) 4.22 - 5.81 MIL/uL   Hemoglobin 8.5 (L) 13.0 - 17.0 g/dL   HCT 25.6 (L) 39.0 - 52.0 %   MCV 92.8 78.0 - 100.0 fL   MCH 30.8 26.0 - 34.0 pg   MCHC 33.2 30.0 - 36.0 g/dL   RDW 18.8 (H) 11.5 - 15.5 %   Platelets 155 150 - 400 K/uL   Neutrophils  Relative % 59 43 - 77 %   Neutro Abs 8.1 (H) 1.7 - 7.7 K/uL   Lymphocytes Relative 21 12 - 46 %   Lymphs Abs 2.8 0.7 - 4.0 K/uL   Monocytes Relative 18 (H) 3 - 12 %   Monocytes Absolute 2.4 (H) 0.1 - 1.0 K/uL   Eosinophils Relative 3 0 - 5 %   Eosinophils Absolute 0.4 0.0 - 0.7 K/uL   Basophils Relative 1 0 - 1 %   Basophils Absolute 0.1 0.0 - 0.1 K/uL  Comprehensive metabolic panel     Status: Abnormal   Collection Time: 08/10/14  5:10 AM  Result Value Ref Range   Sodium 138 135 - 145 mmol/L   Potassium 4.0 3.5 - 5.1 mmol/L   Chloride 107 101 - 111 mmol/L   CO2 26 22 - 32 mmol/L   Glucose, Bld 107 (H) 65 - 99 mg/dL   BUN 24 (H) 6 - 20 mg/dL   Creatinine, Ser 0.55 (L) 0.61 - 1.24 mg/dL   Calcium 7.6 (L) 8.9 - 10.3 mg/dL   Total Protein 6.0 (L) 6.5 - 8.1 g/dL   Albumin 2.6 (L) 3.5 - 5.0 g/dL   AST 79 (H) 15 - 41 U/L   ALT 40 17 - 63 U/L   Alkaline Phosphatase 81 38 - 126 U/L   Total Bilirubin 6.0 (H) 0.3 - 1.2 mg/dL   GFR calc non Af Amer >60 >60 mL/min   GFR calc Af Amer >60 >60 mL/min    Comment: (NOTE) The eGFR has been calculated using the CKD EPI equation. This calculation has not been validated in all clinical situations. eGFR's persistently <60 mL/min signify possible Chronic Kidney Disease.    Anion gap 5 5 - 15  Magnesium     Status: None   Collection Time: 08/10/14  5:10 AM  Result Value Ref Range   Magnesium 2.1 1.7 - 2.4 mg/dL  Ammonia     Status: Abnormal   Collection Time: 08/10/14  6:10 AM  Result Value Ref Range   Ammonia 87 (H) 9 - 35 umol/L  Glucose, capillary     Status: None   Collection Time: 08/10/14  8:21 AM  Result Value Ref Range   Glucose-Capillary 99 65 - 99 mg/dL   Comment 1 Notify RN    Comment 2 Document  in Chart   Hemoglobin     Status: Abnormal   Collection Time: 08/10/14  8:32 AM  Result Value Ref Range   Hemoglobin 8.4 (L) 13.0 - 17.0 g/dL  Urinalysis, Routine w reflex microscopic (not at [facility])     Status: Abnormal   Collection  Time: 08/10/14  2:04 PM  Result Value Ref Range   Color, Urine ORANGE (A) YELLOW    Comment: BIOCHEMICALS MAY BE AFFECTED BY COLOR   APPearance CLEAR CLEAR   Specific Gravity, Urine 1.024 1.005 - 1.030   pH 6.0 5.0 - 8.0   Glucose, UA NEGATIVE NEGATIVE mg/dL   Hgb urine dipstick MODERATE (A) NEGATIVE   Bilirubin Urine SMALL (A) NEGATIVE   Ketones, ur NEGATIVE NEGATIVE mg/dL   Protein, ur NEGATIVE NEGATIVE mg/dL   Urobilinogen, UA 1.0 0.0 - 1.0 mg/dL   Nitrite NEGATIVE NEGATIVE   Leukocytes, UA TRACE (A) NEGATIVE  Culture, Urine     Status: None (Preliminary result)   Collection Time: 08/10/14  2:04 PM  Result Value Ref Range   Specimen Description URINE, CATHETERIZED    Special Requests NONE    Culture      NO GROWTH < 24 HOURS Performed at [facility]    Report Status PENDING   Urine microscopic-add on     Status: Abnormal   Collection Time: 08/10/14  2:04 PM  Result Value Ref Range   Squamous Epithelial / LPF RARE RARE   WBC, UA 0-2 <3 WBC/hpf   RBC / HPF 3-6 <3 RBC/hpf   Bacteria, UA FEW (A) RARE  CBC with Differential/Platelet     Status: Abnormal   Collection Time: 08/11/14  5:45 AM  Result Value Ref Range   WBC 10.3 4.0 - 10.5 K/uL   RBC 2.54 (L) 4.22 - 5.81 MIL/uL   Hemoglobin 7.9 (L) 13.0 - 17.0 g/dL   HCT 23.9 (L) 39.0 - 52.0 %   MCV 94.1 78.0 - 100.0 fL   MCH 31.1 26.0 - 34.0 pg   MCHC 33.1 30.0 - 36.0 g/dL   RDW 18.4 (H) 11.5 - 15.5 %   Platelets 141 (L) 150 - 400 K/uL   Neutrophils Relative % 58 43 - 77 %   Neutro Abs 5.9 1.7 - 7.7 K/uL   Lymphocytes Relative 19 12 - 46 %   Lymphs Abs 2.0 0.7 - 4.0 K/uL   Monocytes Relative 21 (H) 3 - 12 %   Monocytes Absolute 2.1 (H) 0.1 - 1.0 K/uL   Eosinophils Relative 2 0 - 5 %   Eosinophils Absolute 0.2 0.0 - 0.7 K/uL   Basophils Relative 0 0 - 1 %   Basophils Absolute 0.0 0.0 - 0.1 K/uL  Comprehensive metabolic panel     Status: Abnormal   Collection Time: 08/11/14  5:45 AM  Result Value Ref Range    Sodium 138 135 - 145 mmol/L   Potassium 3.6 3.5 - 5.1 mmol/L   Chloride 105 101 - 111 mmol/L   CO2 26 22 - 32 mmol/L   Glucose, Bld 115 (H) 65 - 99 mg/dL   BUN 19 6 - 20 mg/dL   Creatinine, Ser 0.58 (L) 0.61 - 1.24 mg/dL   Calcium 7.4 (L) 8.9 - 10.3 mg/dL   Total Protein 6.0 (L) 6.5 - 8.1 g/dL   Albumin 2.5 (L) 3.5 - 5.0 g/dL   AST 80 (H) 15 - 41 U/L   ALT 38 17 - 63 U/L   Alkaline Phosphatase 82 38 -  126 U/L   Total Bilirubin 3.4 (H) 0.3 - 1.2 mg/dL   GFR calc non Af Amer >60 >60 mL/min   GFR calc Af Amer >60 >60 mL/min    Comment: (NOTE) The eGFR has been calculated using the CKD EPI equation. This calculation has not been validated in all clinical situations. eGFR's persistently <60 mL/min signify possible Chronic Kidney Disease.    Anion gap 7 5 - 15  Protime-INR     Status: Abnormal   Collection Time: 08/11/14  5:45 AM  Result Value Ref Range   Prothrombin Time 17.5 (H) 11.6 - 15.2 seconds   INR 1.42 0.00 - 1.49  Ammonia     Status: Abnormal   Collection Time: 08/11/14  6:21 AM  Result Value Ref Range   Ammonia 65 (H) 9 - 35 umol/L  Glucose, capillary     Status: Abnormal   Collection Time: 08/11/14  8:24 AM  Result Value Ref Range   Glucose-Capillary 103 (H) 65 - 99 mg/dL    Vitals: Blood pressure 165/76, pulse 109, temperature 99 F (37.2 C), temperature source Oral, resp. rate 32, height $RemoveBe'5\' 8"'igrlqNvce$  (1.727 m), weight 93 kg (205 lb 0.4 oz), SpO2 95 %.  Risk to Self: Is patient at risk for suicide?: No Risk to Others:   Prior Inpatient Therapy:   Prior Outpatient Therapy:    Current Facility-Administered Medications  Medication Dose Route Frequency Provider Last Rate Last Dose  . atorvastatin (LIPITOR) tablet 40 mg  40 mg Oral q morning - 10a Ivor Costa, MD   40 mg at 08/08/14 1127  . EPINEPHrine (EPI-PEN) injection 0.3 mg  0.3 mg Intramuscular PRN Ivor Costa, MD      . fluticasone (FLONASE) 50 MCG/ACT nasal spray 1 spray  1 spray Each Nare BID PRN Ivor Costa, MD       . folic acid (FOLVITE) tablet 1 mg  1 mg Oral Daily Ivor Costa, MD   1 mg at 08/06/14 1022  . haloperidol (HALDOL) tablet 0.5 mg  0.5 mg Oral Q8H PRN Velvet Bathe, MD       Or  . haloperidol lactate (HALDOL) injection 1 mg  1 mg Intramuscular Q6H PRN Velvet Bathe, MD      . hydrALAZINE (APRESOLINE) injection 10 mg  10 mg Intravenous Q6H PRN Eugenie Filler, MD   10 mg at 08/06/14 1909  . ipratropium (ATROVENT) 0.06 % nasal spray 2 spray  2 spray Nasal QID PRN Ivor Costa, MD      . ipratropium-albuterol (DUONEB) 0.5-2.5 (3) MG/3ML nebulizer solution 3 mL  3 mL Nebulization Q4H PRN Ivor Costa, MD      . lactulose (CHRONULAC) 10 GM/15ML solution 20 g  20 g Oral TID Willia Craze, NP      . metoprolol (LOPRESSOR) injection 5 mg  5 mg Intravenous 3 times per day Eugenie Filler, MD   5 mg at 08/11/14 0546  . multivitamin with minerals tablet 1 tablet  1 tablet Oral Daily Ivor Costa, MD   1 tablet at 08/06/14 1022  . ondansetron (ZOFRAN) injection 4 mg  4 mg Intravenous Q6H PRN Ritta Slot, NP   4 mg at 08/07/14 1633  . pantoprazole (PROTONIX) injection 40 mg  40 mg Intravenous Q12H Albertine Patricia, MD   40 mg at 08/11/14 0901  . phytonadione (VITAMIN K) SQ injection 10 mg  10 mg Subcutaneous Daily Inda Castle, MD   10 mg at 08/10/14 1132  .  rifaximin (XIFAXAN) tablet 550 mg  550 mg Oral BID Willia Craze, NP      . sodium chloride 0.9 % injection 10-40 mL  10-40 mL Intracatheter Q12H Eugenie Filler, MD   10 mL at 08/11/14 0907  . sodium chloride 0.9 % injection 10-40 mL  10-40 mL Intracatheter PRN Eugenie Filler, MD   10 mL at 08/07/14 1047  . sodium chloride 0.9 % injection 3 mL  3 mL Intravenous Q12H Ivor Costa, MD   3 mL at 08/10/14 2114  . thiamine (VITAMIN B-1) tablet 100 mg  100 mg Oral Daily Ivor Costa, MD   100 mg at 08/06/14 1022   Or  . thiamine (B-1) injection 100 mg  100 mg Intravenous Daily Ivor Costa, MD   100 mg at 08/10/14 1133    Musculoskeletal: Strength &  Muscle Tone: decreased Gait & Station: unable to stand Patient leans: N/A  Psychiatric Specialty Exam: Physical Exam as per history and physical   ROS confusion and mild agitation, status post rectal bleeding secondary to chronic alcohol abuse versus dependence. No Fever-chills, No Headache, No changes with Vision or hearing, reports vertigo No problems swallowing food or Liquids, No Chest pain, Cough or Shortness of Breath, No Abdominal pain, No Nausea or Vommitting, Bowel movements are regular, No Blood in stool or Urine, No dysuria, No new skin rashes or bruises, No new joints pains-aches,  No new weakness, tingling, numbness in any extremity, No recent weight gain or loss, No polyuria, polydypsia or polyphagia,   A full 10 point Review of Systems was done, except as stated above, all other Review of Systems were negative.  Blood pressure 165/76, pulse 109, temperature 99 F (37.2 C), temperature source Oral, resp. rate 32, height $RemoveBe'5\' 8"'QTcoQbOtu$  (1.727 m), weight 93 kg (205 lb 0.4 oz), SpO2 95 %.Body mass index is 31.18 kg/(m^2).  General Appearance: Guarded  Eye Contact::  Fair  Speech:  Clear and Coherent and Slow  Volume:  Decreased  Mood:  Anxious and Irritable  Affect:  Non-Congruent and Depressed  Thought Process:  Irrelevant and Loose  Orientation:  Other:  Patient has been confused about year, and date. Patient knows his first name last name and his daughter who is at bedside.  Thought Content:  Rumination  Suicidal Thoughts:  No  Homicidal Thoughts:  No  Memory:  Immediate;   Fair Recent;   Poor  Judgement:  Impaired  Insight:  Lacking  Psychomotor Activity:  Restlessness  Concentration:  Fair  Recall:  Poor  Fund of Knowledge:Fair  Language: Fair  Akathisia:  Negative  Handed:  Right  AIMS (if indicated):     Assets:  Communication Skills Desire for Improvement Financial Resources/Insurance Housing Leisure Time Resilience Social Support  ADL's:  Impaired   Cognition: Impaired,  Mild  Sleep:      Medical Decision Making: New problem, with additional work up planned, Review of Psycho-Social Stressors (1), Review or order clinical lab tests (1), Established Problem, Worsening (2), Review of Last Therapy Session (1), Review or order medicine tests (1), Review of Medication Regimen & Side Effects (2) and Review of New Medication or Change in Dosage (2)  Treatment Plan Summary: Daily contact with patient to assess and evaluate symptoms and progress in treatment and Medication management  Plan: Patient does not meet criteria for capacity to make his own medical condition secondary to currently in subacute delirium during my evaluation. Alcohol abuse versus dependence: Monitor for alcohol withdrawal symptoms  and consider Ativan detox if needed, CIWA protocol May use Seroquel 25 mg 3 times a day as needed for restlessness and agitation Patient does not meet criteria for psychiatric inpatient admission. Supportive therapy provided about ongoing stressors.  Appreciate psychiatric consultation and follow up as clinically required Please contact [phone] or [phone] if needs further assistance  Disposition: Patient will be discharged home when medically stable with the patient daughter  Durward Parcel. 08/11/2014 12:35 PM

## 2014-08-11 NOTE — Progress Notes (Signed)
CSW met with pt/daughter is am to assist with d/c planning. PT recommended SNF placement on 08/08/14. Daughter reports that she will be taking pt home when stable for d/c. CSW signing off. RNCM will assist with d/c planning needs. CSW is available to assist with SNF placement if plan changes.  Werner Lean LCSW [phone]

## 2014-08-11 NOTE — Progress Notes (Signed)
    Progress Note   Subjective  Confused today   Objective   Vital signs in last 24 hours: Temp:  [98.7 F (37.1 C)-99.9 F (37.7 C)] 99.5 F (37.5 C) (06/22 0800) Pulse Rate:  [83-123] 89 (06/22 0800) Resp:  [16-33] 19 (06/22 0800) BP: (131-193)/(30-95) 152/63 mmHg (06/22 0800) SpO2:  [93 %-100 %] 99 % (06/22 0800) Last BM Date: 08/09/14 General:    Black male in NAD Abdomen:  Soft, nontender and nondistended. Normal bowel sounds. Extremities:  Without edema. Neurologic:  Alert , confused. Mild asterixis   Lab Results:  Recent Labs  08/09/14 2056 08/10/14 0510 08/10/14 0832 08/11/14 0545  WBC 12.7* 13.7*  --  10.3  HGB 8.5* 8.5* 8.4* 7.9*  HCT 26.0* 25.6*  --  23.9*  PLT 158 155  --  141*   BMET  Recent Labs  08/09/14 0630 08/10/14 0510 08/11/14 0545  NA 134* 138 138  K 4.7 4.0 3.6  CL 106 107 105  CO2 25 26 26   GLUCOSE 123* 107* 115*  BUN 21* 24* 19  CREATININE 1.03 0.55* 0.58*  CALCIUM 7.5* 7.6* 7.4*   LFT  Recent Labs  08/11/14 0545  PROT 6.0*  ALBUMIN 2.5*  AST 80*  ALT 38  ALKPHOS 82  BILITOT 3.4*   PT/INR  Recent Labs  08/11/14 0545  LABPROT 17.5*  INR 1.42   NH3 65 was 87   Assessment / Plan:    74. 74 year old male with decompensated etoh cirrhosis evidenced by encephalopathy, variceal bleed. He is s/p EGD with banding of large esophageal varices 08/07/14.  Recurrent rectal bleeding two mornings ago. Hgb fell from 8.2 to 5.6. Repeat EGD yesterday negative for active bleeding. Patient transfused, hgb rose to 8.5 and has remained stable.  Will discontinue Octreotide. Continue BID PPI.  He is very confused today, mild asterixis. Not sleeping, agitated during night. Suspect encephalopathy but Hospitalist ruling out other etiologies. Patient was getting lactulose enemas but will take it PO now. Of note, not getting the Xifaxan.  Pharmacy concerned about crushing the drug. I spoke to RN, we need to crush the Xifaxan and give it in  applesauce if patient will take it. In meantime will increase PO Lactulose to TID.    2. Ileus. Abdomen is soft but still moderately distended. Active bowel sounds. No significant ascites on ultrasound.  3. Anemia of acute blood loss, improved post transfusion.   4. 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 Likely correlates with duodenal abnormality seen on CTscan.    LOS: 8 days   Willette Cluster  08/11/2014, 9:21 AM   [facility] Attending  I have also seen and assessed the patient and agree with the advanced practitioner's assessment and plan. My Hx and PE same   Problems we are treating are cirrhosis, hepatic encephalopathy, esophageal varices w/ bleeding, rectal varices w/bleeding.   He is not bleeding but still having problems with encephalopathy and also suspect sundowning. I reviewed situation with daughter today.  Hopefully we can get encephalopathy better and get him to floor. I mentioned possibility of short-term SNF but she is opposed to SNF in general.  Overall improved but still seriously ill. Continue current care though can come off octreotide and go to po PPI  Probable eventual beta blocker  Iva Boop, MD, [facility] [phone] (pager) 08/11/2014 1:08 PM

## 2014-08-11 NOTE — Clinical Social Work Psych Assess (Signed)
Clinical Social Work Librarian, academic  Clinical Social Worker:  [place], [place] Date/Time:  08/11/2014, 3:42 PM Referred By:  Physician Date Referred:  08/11/14 Reason for Referral:  Substance Abuse   Presenting Symptoms/Problems  Presenting Symptoms/Problems(in person's/family's own words):  Psych consulted due to substance abuse and agitation.   Abuse/Neglect/Trauma History  Abuse/Neglect/Trauma History:  Denies History Abuse/Neglect/Trauma History Comments (indicate dates):  N/A   Psychiatric History  Psychiatric History:  Denies History Psychiatric Medication:  N/A   Current Mental Health Hospitalizations/Previous Mental Health History:  No previous MH history.   Current Provider:  None Place and Date:  N/A  Current Medications:    Scheduled Meds: . folic acid  1 mg Oral Daily  . lactulose  20 g Oral TID  . metoprolol  5 mg Intravenous 3 times per day  . multivitamin with minerals  1 tablet Oral Daily  . pantoprazole (PROTONIX) IV  40 mg Intravenous Q12H  . QUEtiapine  25 mg Oral TID  . rifaximin  550 mg Oral BID  . sodium chloride  10-40 mL Intracatheter Q12H  . sodium chloride  3 mL Intravenous Q12H  . thiamine  100 mg Oral Daily   Or  . thiamine  100 mg Intravenous Daily   Continuous Infusions:  PRN Meds:.EPINEPHrine, fluticasone, haloperidol **OR** haloperidol lactate, hydrALAZINE, ipratropium, ipratropium-albuterol, ondansetron (ZOFRAN) IV, sodium chloride     Previous Inpatient Admission/Date/Reason:  None reported   Emotional Health/Current Symptoms  Suicide/Self Harm: None Reported Suicide Attempt in Past (date/description):  N/A  Other Harmful Behavior (ex. homicidal ideation) (describe):  None reported   Psychotic/Dissociative Symptoms  Psychotic/Dissociative Symptoms: None Reported Other Psychotic/Dissociative Symptoms:  N/A   Attention/Behavioral Symptoms  Attention/Behavioral Symptoms: Restless Other  Attention/Behavioral Symptoms:  Patient confused and unable to fully participate in assessment.   Cognitive Impairment  Cognitive Impairment:  Impairment Due to Current Medical Condition/Treatment (stroke, reaction to medication/reaction to infections, etc.) Other Cognitive Impairment:  Dtr reports patient has been confused since procedure yesterday.  Mood and Adjustment  Mood and Adjustment:  Confused   Stress, Anxiety, Trauma, Any Recent Loss/Stressor  Stress, Anxiety, Trauma, Any Recent Loss/Stressor: None Reported Anxiety (frequency):  Patient was prescribed anxiety medication after trach because of anxiety of being in public with new trach.  Phobia (specify):  N/A  Compulsive Behavior (specify):  N/A  Obsessive Behavior (specify): N/A  Other Stress, Anxiety, Trauma, Any Recent Loss/Stressor:  N/A   Substance Abuse/Use  Substance Abuse/Use: Current Substance Use SBIRT Completed (please refer for detailed history): No Self-reported Substance Use (last use and frequency):  Dtr reports patient started drinking around the age of 64. Patient drinks vodka and Kool-Aide throughout the day. Patient's last drink was a couple of days prior to admission.  Urinary Drug Screen Completed: Yes Alcohol Level:  <5   Environment/Housing/Living Arrangement  Environmental/Housing/Living Arrangement: Stable Housing, With Family Member Who is in the Home:  Dtr-Tracey  Emergency Contact:  Tracey-dtr   Financial  Financial: Medicare   Patient's Strengths and Goals  Patient's Strengths and Goals (patient's own words):  Patient has supportive family.    Clinical Social Worker's Interpretive Summary  Clinical Social Workers Interpretive Summary:    CSW rounded with psych MD to evaluate patient. Patient laying in bed and has sitter present for safety concerns. Patient attempted to participate in assessment but is confused and answers questions inappropriately so dtr provided most  of information.  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 Patient lived home alone until about 2-3 years ago. Patient was in [place] and had an allegeric reaction which required a trach. MD prescribed anxiety medication in order for patient to manage anxiety related to trach but patient stopped anxiety medication in January 2016. Patient required additional assistance after trach and dtr moved in with him. Dtr reports that patient has been drinking alcohol since he was around 74 years old. Patient drinks throughout the day. Dtr reports patient was responsible for mowing the yard, walking the dog, and small tasks at home prior to admission but would drink constantly. Dtr is unsure the amount in which patient consumed. Patient has been to AA meetings and outpatient therapy in the past but did not continue with treatment because he did not find it beneficial.   Dtr reports that patient has been confused off and on but that after procedure yesterday, patient's confusion increased and patient became difficult to redirect. Dtr reports she is concerned about patient's confusion. Dtr plans to have patient return home with her at DC and she will hire 24/7 caregivers. Dtr does not want patient to drink any longer and has removed all alcoholic from the house. Patient was buying his own alcohol prior to admission but dtr will not provide patient with car keys or money once he returns home.  Psych MD noted patient does not have capacity to make his own decisions as this time. CSW will continue to follow throughout hospital stay.   Disposition  Disposition: Recommend Psych CSW Continuing To Support While In [facility], [place] [phone]

## 2014-08-12 DIAGNOSIS — K567 Ileus, unspecified: Secondary | ICD-10-CM

## 2014-08-12 LAB — GLUCOSE, CAPILLARY: Glucose-Capillary: 108 mg/dL — ABNORMAL HIGH (ref 65–99)

## 2014-08-12 LAB — URINE CULTURE: Culture: NO GROWTH

## 2014-08-12 LAB — CBC
HEMATOCRIT: 23.4 % — AB (ref 39.0–52.0)
HEMOGLOBIN: 7.4 g/dL — AB (ref 13.0–17.0)
MCH: 30 pg (ref 26.0–34.0)
MCHC: 31.6 g/dL (ref 30.0–36.0)
MCV: 94.7 fL (ref 78.0–100.0)
Platelets: 144 10*3/uL — ABNORMAL LOW (ref 150–400)
RBC: 2.47 MIL/uL — AB (ref 4.22–5.81)
RDW: 18.2 % — ABNORMAL HIGH (ref 11.5–15.5)
WBC: 9.6 10*3/uL (ref 4.0–10.5)

## 2014-08-12 LAB — HEMOGLOBIN AND HEMATOCRIT, BLOOD
HEMATOCRIT: 23.3 % — AB (ref 39.0–52.0)
Hemoglobin: 7.6 g/dL — ABNORMAL LOW (ref 13.0–17.0)

## 2014-08-12 NOTE — Clinical Documentation Improvement (Deleted)
  08/11/14 Per CMS, cause and effect relationships cannot be assumed and must be documented by the attending physician.  Please clarify the suspected, likely or known cause of the patient's upper GI bleed:   - Esophageal varices  - Other cause  - Unable to clinically determine   Thank You, Jerral Ralph ,RN Clinical Documentation Specialist:  [phone] [facility]- Health Information Management

## 2014-08-12 NOTE — Progress Notes (Signed)
Date:  August 12, 2014 U.R. performed for needs and level of care. Will continue to follow for Case Management needs.  Francelia Mclaren, RN, BSN, CCM   [phone] 

## 2014-08-12 NOTE — Consult Note (Signed)
Psychiatry Consult follow-up  Reason for Consult:  Alcohol abuse vs dependence with multiple medical complications and acute encephalopathy and gastric bleeding. Referring Physician:  Dr. Cena Benton Patient Identification: Keith [name] MRN:  [id number] Principal Diagnosis: Alcohol abuse Diagnosis:   Patient Active Problem List   Diagnosis Date Noted  . Ileus [K56.7]   . 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 Cirrhosis [K74.60]   . SBO (small bowel obstruction) [K56.69] 08/08/2014  . Leukocytosis [D72.829]   . 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 Adynamic ileus [K56.0] 08/05/2014  . Hypernatremia [E87.0] 08/05/2014  . GI bleed [K92.2] 08/04/2014  . Encephalopathy, hepatic [K72.90] 08/04/2014  . Hepatic cirrhosis [K74.60] 08/04/2014  . Acute blood loss anemia [D62]   . Lower GI bleed [K92.2] 08/03/2014  . Acute encephalopathy [G93.40] 08/03/2014  . Rectal bleeding [K62.5] 08/03/2014  . Alcohol abuse [F10.10] 08/03/2014  . GIB (gastrointestinal bleeding) [K92.2] 08/03/2014  . Chronic diastolic CHF (congestive heart failure) [I50.32]   . Abdominal pain [R10.9]   . Atrial fibrillation [I48.91] 07/15/2012  . Fever, unspecified [R50.9] 06/17/2012  . Airway obstruction/recurrent [J98.8] 06/14/2012  . 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 Fracture of rib of right side [S22.31XA] 06/14/2012  . Fracture, sternum closed [S22.20XA] 06/14/2012  . Cardiac arrest [I46.9] 05/31/2012  . Bleeding [R58] 05/31/2012  . Tracheostomy complication [J95.00] 05/31/2012  . Fracture of multiple ribs [S22.49XA] 05/31/2012  . Sternal fracture [S22.20XA] 05/31/2012  . Acute respiratory failure [J96.00] 05/31/2012  . Laryngeal edema [J38.4] 05/22/2012  . Hypertension [I10] 05/22/2012  . Hyperlipidemia [E78.5] 05/22/2012  . Habitual alcohol use [F10.10] 05/22/2012    Total Time spent with patient: 30  minutes  Subjective:   Keith [name] is a 74 y.o. male patient admitted with alcohol abuse, rectal bleeding and agitation during last night.  HPI:  Keith [name] is a 74 y.o. male seen face-to-face for psychiatric consultation and evaluation along with the psychiatric social service and also reviewed available medical records. Reportedly patient become irritable as stated and trying to get out of bed last night and required more medication management. Patient daughter who is at bedside who is able to provide collateral information in this case. Reportedly patient suffered with multiple medical problems including gastrointestinal bleeding. Patient is not a reliable historian because of confusion at this time. Patient daughter reported that he has been drinking liquor regularly over several years. Patient has no interest in quitting drinking alcohol. Patient daughter stated she moved into his home to care for him about 3 years ago secondary to knee become physically sick and disabled at that time and also required tracheostomy. Patient has no previous history of acute psychiatric hospitalization. Patient denies current symptoms of depression, anxiety, mania, psychosis. Patient has no current suicidal/homicidal ideation, intention or plans. Patient stated that he cannot drink anymore because of his current medical condition and patient daughter stated that they are looking for 24-hour supervision and home health care at home when medically stable.  Interval history: Patient has excessive sedation during this morning but with the time he was able to be cleared with the better cognitive function. Patient was seen by psychiatric social service and Dr. Waymon Amato and stated patient daughter has concerns about him to be overmedicated. During my visit this and has been working with physical therapy without much sedation. Case discussed with the hospitalist and reported patient medication Haldol will be  discontinued and will continue Seroquel because of continued to have restlessness and trying to get out of the bed has a part of alcohol withdrawal symptoms. Patient and his family reported patient has no intention to stop drinking or placement of out of home.   Past Medical History:  Past Medical History  Diagnosis Date  . HTN (hypertension)   . Hypercholesterolemia   . Alcohol abuse 2014  . A-fib 05/2012    in the setting of acute medical illness/ respiratory arrest. Not placed on anticoagulant.   . History of GI bleed ~ 1990    bleeding from ulcer  . History of cardiac arrest 05/2012    due to tracheostomy site bleeding and airway obstruction  . Angioedema 05/2012    with acute resp failure and hypoxia due to Lisinopril  . Respiratory arrest 05/2012    caused by tracheostomy site bleeding and subsequent airway obstruciton.   . Anemia 05/2012, 07/2014.     normocytic 2014, macrocytic 2016  . Cirrhosis 07/2014    Past Surgical History  Procedure Laterality Date  . Tracheostomy  05/22/12    emergent, angioedema  . Tracheostomy tube placement N/A 05/31/2012    Procedure: TRACHEOSTOMY;  Surgeon: Jodi Marble, MD;  Location: [facility];  Service: ENT;  Laterality: N/A;  . Laryngoscopy N/A 05/31/2012    Procedure: LARYNGOSCOPY;  Surgeon: Jodi Marble, MD;  Location: [place];  Service: ENT;  Laterality: N/A;  . Laryngoscopy N/A 06/19/2012    Procedure: DIRECT LARYNGOSCOPY AND BRONCHOSCOPY;  Surgeon: Ruby Cola, MD;  Location: [place];  Service: ENT;  Laterality: N/A;  . Tracheostomy revision N/A 06/19/2012    Procedure: TRACHEOSTOMY DECANNULATION;  Surgeon: Ruby Cola, MD;  Location: [place];  Service: ENT;  Laterality: N/A;  [place]  . Esophagogastroduodenoscopy N/A 08/07/2014    Procedure: ESOPHAGOGASTRODUODENOSCOPY (EGD);  Surgeon: Milus Banister, MD;  Location: [facility];  Service: Endoscopy;  Laterality: N/A;  . Esophagogastroduodenoscopy N/A 08/10/2014     Procedure: ESOPHAGOGASTRODUODENOSCOPY (EGD);  Surgeon: Gatha Mayer, MD;  Location: [facility];  Service: Endoscopy;  Laterality: N/A;  bedside   . Flexible sigmoidoscopy N/A 08/10/2014    Procedure: FLEXIBLE SIGMOIDOSCOPY;  Surgeon: Gatha Mayer, MD;  Location: [facility];  Service: Endoscopy;  Laterality: N/A;  bedside   Family History:  Family History  Problem Relation Age of Onset  . Breast cancer Mother   . Stroke Father    Social History:  History  Alcohol Use  . 0.0 oz/week  . 0 Standard drinks or equivalent per week    Comment: drinks daily. in 07/2012 drinking of 16-24 oz vodka daily reported.      History  Drug Use No    History   Social History  . Marital Status: Single    Spouse Name: N/A  . Number of Children: N/A  . Years of Education: N/A   Social History Main Topics  . Smoking status: Never Smoker   . Smokeless tobacco: Not on file  . Alcohol Use: 0.0 oz/week    0 Standard drinks or equivalent per week     Comment: drinks daily. in 07/2012 drinking of 16-24 oz vodka daily reported.   . Drug Use: No  . Sexual Activity: Not on file   Other Topics Concern  . None   Social History Narrative   Additional Social History:  Allergies:   Allergies  Allergen Reactions  . Lisinopril Other (See Comments)    Presented with laryngeal edema - likely caused is lisinopril   . Amlodipine Swelling    Labs:  Results for orders placed or performed during the hospital encounter of 08/03/14 (from the past 48 hour(s))  CBC with Differential/Platelet     Status: Abnormal   Collection Time: 08/11/14  5:45 AM  Result Value Ref Range   WBC 10.3 4.0 - 10.5 K/uL   RBC 2.54 (L) 4.22 - 5.81 MIL/uL   Hemoglobin 7.9 (L) 13.0 - 17.0 g/dL   HCT 23.9 (L) 39.0 - 52.0 %   MCV 94.1 78.0 - 100.0 fL   MCH 31.1 26.0 - 34.0 pg   MCHC 33.1 30.0 - 36.0 g/dL   RDW 18.4 (H) 11.5 - 15.5 %   Platelets 141 (L) 150 - 400 K/uL   Neutrophils  Relative % 58 43 - 77 %   Neutro Abs 5.9 1.7 - 7.7 K/uL   Lymphocytes Relative 19 12 - 46 %   Lymphs Abs 2.0 0.7 - 4.0 K/uL   Monocytes Relative 21 (H) 3 - 12 %   Monocytes Absolute 2.1 (H) 0.1 - 1.0 K/uL   Eosinophils Relative 2 0 - 5 %   Eosinophils Absolute 0.2 0.0 - 0.7 K/uL   Basophils Relative 0 0 - 1 %   Basophils Absolute 0.0 0.0 - 0.1 K/uL  Comprehensive metabolic panel     Status: Abnormal   Collection Time: 08/11/14  5:45 AM  Result Value Ref Range   Sodium 138 135 - 145 mmol/L   Potassium 3.6 3.5 - 5.1 mmol/L   Chloride 105 101 - 111 mmol/L   CO2 26 22 - 32 mmol/L   Glucose, Bld 115 (H) 65 - 99 mg/dL   BUN 19 6 - 20 mg/dL   Creatinine, Ser 0.58 (L) 0.61 - 1.24 mg/dL   Calcium 7.4 (L) 8.9 - 10.3 mg/dL   Total Protein 6.0 (L) 6.5 - 8.1 g/dL   Albumin 2.5 (L) 3.5 - 5.0 g/dL   AST 80 (H) 15 - 41 U/L   ALT 38 17 - 63 U/L   Alkaline Phosphatase 82 38 - 126 U/L   Total Bilirubin 3.4 (H) 0.3 - 1.2 mg/dL   GFR calc non Af Amer >60 >60 mL/min   GFR calc Af Amer >60 >60 mL/min    Comment: (NOTE) The eGFR has been calculated using the CKD EPI equation. This calculation has not been validated in all clinical situations. eGFR's persistently <60 mL/min signify possible Chronic Kidney Disease.    Anion gap 7 5 - 15  Protime-INR     Status: Abnormal   Collection Time: 08/11/14  5:45 AM  Result Value Ref Range   Prothrombin Time 17.5 (H) 11.6 - 15.2 seconds   INR 1.42 0.00 - 1.49  Ammonia     Status: Abnormal   Collection Time: 08/11/14  6:21 AM  Result Value Ref Range   Ammonia 65 (H) 9 - 35 umol/L  Glucose, capillary     Status: Abnormal   Collection Time: 08/11/14  8:24 AM  Result Value Ref Range   Glucose-Capillary 103 (H) 65 - 99 mg/dL  Hemoglobin and hematocrit, blood     Status: Abnormal   Collection Time: 08/12/14 12:19 AM  Result Value Ref Range   Hemoglobin 7.6 (L) 13.0 - 17.0 g/dL   HCT 23.3 (L) 39.0 - 52.0 %  CBC  Status: Abnormal   Collection Time:  08/12/14  4:17 AM  Result Value Ref Range   WBC 9.6 4.0 - 10.5 K/uL   RBC 2.47 (L) 4.22 - 5.81 MIL/uL   Hemoglobin 7.4 (L) 13.0 - 17.0 g/dL   HCT 23.4 (L) 39.0 - 52.0 %   MCV 94.7 78.0 - 100.0 fL   MCH 30.0 26.0 - 34.0 pg   MCHC 31.6 30.0 - 36.0 g/dL   RDW 18.2 (H) 11.5 - 15.5 %   Platelets 144 (L) 150 - 400 K/uL  Glucose, capillary     Status: Abnormal   Collection Time: 08/12/14  8:18 AM  Result Value Ref Range   Glucose-Capillary 108 (H) 65 - 99 mg/dL   Comment 1 Notify RN    Comment 2 Document in Chart     Vitals: Blood pressure 112/52, pulse 95, temperature 99.1 F (37.3 C), temperature source Oral, resp. rate 15, height $RemoveBe'5\' 8"'[id number]$  (1.727 m), weight 93 kg (205 lb 0.4 oz), SpO2 94 %.  Risk to Self: Is patient at risk for suicide?: No Risk to Others:   Prior Inpatient Therapy:   Prior Outpatient Therapy:    Current Facility-Administered Medications  Medication Dose Route Frequency Provider Last Rate Last Dose  . EPINEPHrine (EPI-PEN) injection 0.3 mg  0.3 mg Intramuscular PRN Ivor Costa, MD      . fluticasone (FLONASE) 50 MCG/ACT nasal spray 1 spray  1 spray Each Nare BID PRN Ivor Costa, MD      . folic acid (FOLVITE) tablet 1 mg  1 mg Oral Daily Ivor Costa, MD   1 mg at 08/12/14 0900  . hydrALAZINE (APRESOLINE) injection 10 mg  10 mg Intravenous Q6H PRN Eugenie Filler, MD   10 mg at 08/06/14 1909  . ipratropium (ATROVENT) 0.06 % nasal spray 2 spray  2 spray Nasal QID PRN Ivor Costa, MD      . ipratropium-albuterol (DUONEB) 0.5-2.5 (3) MG/3ML nebulizer solution 3 mL  3 mL Nebulization Q4H PRN Ivor Costa, MD      . lactulose (CHRONULAC) 10 GM/15ML solution 20 g  20 g Oral TID Willia Craze, NP   20 g at 08/12/14 0900  . metoprolol (LOPRESSOR) injection 5 mg  5 mg Intravenous 3 times per day Eugenie Filler, MD   5 mg at 08/12/14 0557  . multivitamin with minerals tablet 1 tablet  1 tablet Oral Daily Ivor Costa, MD   1 tablet at 08/12/14 0900  . ondansetron (ZOFRAN) injection 4 mg   4 mg Intravenous Q6H PRN Ritta Slot, NP   4 mg at 08/07/14 1633  . pantoprazole (PROTONIX) injection 40 mg  40 mg Intravenous Q12H Albertine Patricia, MD   40 mg at 08/12/14 0903  . QUEtiapine (SEROQUEL) tablet 25 mg  25 mg Oral TID Ambrose Finland, MD   25 mg at 08/12/14 1000  . rifaximin (XIFAXAN) tablet 550 mg  550 mg Oral BID Willia Craze, NP   550 mg at 08/12/14 1000  . sodium chloride 0.9 % injection 10-40 mL  10-40 mL Intracatheter Q12H Eugenie Filler, MD   10 mL at 08/12/14 0853  . sodium chloride 0.9 % injection 10-40 mL  10-40 mL Intracatheter PRN Eugenie Filler, MD   10 mL at 08/07/14 1047  . sodium chloride 0.9 % injection 3 mL  3 mL Intravenous Q12H Ivor Costa, MD   3 mL at 08/11/14 2218  . thiamine (VITAMIN B-1) tablet 100 mg  100 mg Oral Daily Ivor Costa, MD   100 mg at 08/12/14 0900   Or  . thiamine (B-1) injection 100 mg  100 mg Intravenous Daily Ivor Costa, MD   100 mg at 08/10/14 1133    Musculoskeletal: Strength & Muscle Tone: decreased Gait & Station: unable to stand Patient leans: N/A  Psychiatric Specialty Exam: Physical Exam   R O S  Blood pressure 112/52, pulse 95, temperature 99.1 F (37.3 C), temperature source Oral, resp. rate 15, height $RemoveBe'5\' 8"'nJsHIeDbw$  (1.727 m), weight 93 kg (205 lb 0.4 oz), SpO2 94 %.Body mass index is 31.18 kg/(m^2).  General Appearance: Guarded  Eye Contact::  Fair  Speech:  Clear and Coherent and Slow  Volume:  Decreased  Mood:  Anxious and Irritable  Affect:  Non-Congruent and Depressed  Thought Process:  Irrelevant and Loose  Orientation:  Other:  Patient has been confused about year, and date. Patient knows his first name last name and his daughter who is at bedside.  Thought Content:  Rumination  Suicidal Thoughts:  No  Homicidal Thoughts:  No  Memory:  Immediate;   Fair Recent;   Poor  Judgement:  Impaired  Insight:  Lacking  Psychomotor Activity:  Restlessness  Concentration:  Fair  Recall:  Poor  Fund of  Knowledge:Fair  Language: Fair  Akathisia:  Negative  Handed:  Right  AIMS (if indicated):     Assets:  Communication Skills Desire for Improvement Financial Resources/Insurance Housing Leisure Time Resilience Social Support  ADL's:  Impaired  Cognition: Impaired,  Mild  Sleep:      Medical Decision Making: New problem, with additional work up planned, Review of Psycho-Social Stressors (1), Review or order clinical lab tests (1), Established Problem, Worsening (2), Review of Last Therapy Session (1), Review or order medicine tests (1), Review of Medication Regimen & Side Effects (2) and Review of New Medication or Change in Dosage (2)  Treatment Plan Summary: Daily contact with patient to assess and evaluate symptoms and progress in treatment and Medication management  Plan: Discontinue Haldol - due to excessive sedation  Patient does not meet criteria for capacity to make his own medical condition secondary to currently in subacute delirium during my evaluation. Alcohol abuse versus dependence:  Monitor for alcohol withdrawal symptoms and consider Ativan detox if needed, CIWA protocol Continue Seroquel 25 mg 3 times a day as needed for restlessness and agitation Patient does not meet criteria for psychiatric inpatient admission. Supportive therapy provided about ongoing stressors.  Appreciate psychiatric consultation and follow up as clinically required Please contact [phone] or [phone] if needs further assistance  Disposition: Patient will be discharged home when medically stable with the patient daughter  Durward Parcel. 08/12/2014 2:58 PM

## 2014-08-12 NOTE — Progress Notes (Signed)
    Progress Note   Subjective  Large, watery maroon stool last night around 11pm.    Objective   Vital signs in last 24 hours: Temp:  [98.5 F (36.9 C)-99 F (37.2 C)] 98.7 F (37.1 C) (06/23 0800) Pulse Rate:  [85-109] 93 (06/23 0900) Resp:  [15-32] 19 (06/23 0900) BP: (96-178)/(40-90) 145/66 mmHg (06/23 0900) SpO2:  [92 %-98 %] 95 % (06/23 0900) Last BM Date: 08/09/14 General:    Black male in NAD. Abdomen:  Soft, nontender, largely distended with hyperactive bowel sounds (some high pitched). Extremities:  Without edema. Neurologic:  Sleepy, difficult to keep awake. No significant asterixis Psych:  Cooperative. Normal mood and affect.  Intake/Output from previous day: 06/22 0701 - 06/23 0700 In: 517.5 [P.O.:250; I.V.:267.5] Out: 1050 [Urine:1050] Intake/Output this shift: Total I/O In: 20 [I.V.:20] Out: -   Lab Results:  Recent Labs  08/10/14 0510  08/11/14 0545 08/12/14 0019 08/12/14 0417  WBC 13.7*  --  10.3  --  9.6  HGB 8.5*  < > 7.9* 7.6* 7.4*  HCT 25.6*  --  23.9* 23.3* 23.4*  PLT 155  --  141*  --  144*  < > = values in this interval not displayed. BMET  Recent Labs  08/10/14 0510 08/11/14 0545  NA 138 138  K 4.0 3.6  CL 107 105  CO2 26 26  GLUCOSE 107* 115*  BUN 24* 19  CREATININE 0.55* 0.58*  CALCIUM 7.6* 7.4*   LFT  Recent Labs  08/11/14 0545  PROT 6.0*  ALBUMIN 2.5*  AST 80*  ALT 38  ALKPHOS 82  BILITOT 3.4*   PT/INR  Recent Labs  08/11/14 0545  LABPROT 17.5*  INR 1.42      Assessment / Plan:    1. Decompensated etoh cirrhosis. He is s/p EGD with banding of large esophageal varices 08/07/14. Recurrent rectal bleeding three days ago with concurrent decline in hgb from 8.2 to 5.6. Repeat EGD was negative for active bleeding. Hgb rose to 8.5 after transfusion.  Large maroon stool last night, hgb has remained stable however so wonder if this was residual blood.    2. Encephalopathy, very confused yesterday. Wasn't getting  Xifaxan, there was concern about crushing it. Xifaxan restarted yesterday afternoon and Lactulose increased to TID. Patient with excessive sleepiness today but got Haldol around 3am. No asterixis on exam. Will see how he is once Haldol wears off.     2. Ileus. Abdomen is soft but still moderately distended. Hyperactive bowel sounds on exam. Only two BMs since lactulose was increase to TID yesterday. No vomiting so still think this is just an ileus. Hyperactive bowel sounds on exam now, hopefully bowel function returning to normal.  No significant ascites on ultrasound. No nausea /vomiting to suggest obstructive process. If no BM in a few hours will give dulcolax supp.   3. Anemia of acute blood loss, improved post transfusion.     LOS: 9 days   Willette Cluster  08/12/2014, 9:57 AM   [facility] Attending  I have also seen and assessed the patient and agree with the advanced practitioner's assessment and plan. My Hx and PE same Seems calm this eve He may have recurrent bleeding from rectal varices  wuill recheck KUB Am  Iva Boop, MD, [facility] [phone] (pager) 08/12/2014 6:19 PM

## 2014-08-12 NOTE — Progress Notes (Addendum)
PROGRESS NOTE    Keith [name] BJY:[id number] DOB: 05/08/1940 DOA: 08/03/2014 PCP: Georgann Housekeeper, MD  HPI/Brief narrative 74 y.o. male with PMH of hypertension, hyperlipidemia, diastolic congestive heart failure, history of GI bleeding, alcohol abuse, who presented with rectal bleeding, altered mental status and abdominal pain. In ED, patient was found to have hemoglobin drop from 9.0 on Friday to 7.2 today, lactate 1.7, and INR 1.42, temperature 97.2, electrolytes okay, FOBT positive, alcohol level less than 5. He was admitted to stepdown unit. GI was consulted and patient is status post EGD and variceal banding. Ongoing treatment for hepatic encephalopathy with improvement in mental status. He was transfused for acute blood loss anemia. Treated for ileus which has improved.   Assessment/Plan:  GI bleeding/large esophageal varices status post banding 7 on 08/07/14 - Status post EGD with banding of large esophageal varices on 08/07/14. Subsequently had recurrent rectal bleeding along with concurrent decline in hemoglobin from 8.2 > 5.6. Repeat EGD & flexible sigmoidoscopy was negative for active bleeding. - Large maroon stool on 6/22 night but hemoglobin has remained stable. Possibly residual blood in the GI system. - Continue IV Protonix  Acute blood loss anemia - Secondary to GI bleed - Status post PRBCs 5 since admission. - Hemoglobin relatively stable in the 7 g range in the last 24 hours. - Follow CBC in a.m. 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 - GI following. Patient wasn't getting [place] was concern about crushing get but has since been started. Continue lactulose. - Sleepy this morning, most likely secondary to Haldol that he received at 3 AM. Also started  on Seroquel by psychiatry last night. Avoid Seroquel. - Improve sleep hygiene. - Urine culture 2 and blood cultures 2: Negative to date  Ileus - Slowly improving. No significant ascites on ultrasound. No nausea or vomiting to suggest obstructive process. - GI following. - Minimize narcotics. - Mobilize with PT.  Abdominal pain - ? Chronic. Now worsened by ileus. CT reports reviewed. Doubt SBP. Completed 5 days of IV Rocephin. IV narcotics discontinued due to concern for hepatic encephalopathy.  Alcoholic cirrhosis - Noted on CT abdomen.  Chronic diastolic CHF - 2-D echo May 2015: EF 60-65 percent with grade 1 diastolic dysfunction. Appears mildly volume overloaded. Lasix and Aldactone on hold secondary to poor oral intake. May have to be resumed.  Essential hypertension - Continue beta blockers. Controlled  Alcohol abuse - No features of overt withdrawal.  Thrombocytopenia - May be related to acute blood loss or cirrhosis. - Stable. Follow CBCs.   DVT prophylaxis: SCDs Code Status: Full Family Communication: Discussed with patient's daughter at bedside on 6/23. Disposition Plan: Remains in stepdown unit.   Consultants:  [facility]  Psychiatry  Procedures:  EGD 08/07/14: ENDOSCOPIC IMPRESSION: There were three trunks of large esopahgeal varices, one of which had red wale sign. There was no active bleeding, however there was fairly fresh appearing red blood clot in the proximal stomach. There were no gastric varices. There appeared to be some minor NG related suction trauma in proximal stomach. There was a minor distal duodenal bulb benign appearing stricture that oozed blood after advancing the gastroscope through it. I placed 7 variceal ligating bands on the esophageal varices in good position. 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 As per daughter at bedside, overnight patient was attempting to get out of bed. As per nursing, patient received a dose of Haldol at 3 AM. Mental status yesterday improving. Maroon-colored stools last night 1. No pain reported.  Objective: Filed Vitals:   08/12/14 1100 08/12/14 1200 08/12/14 1300 08/12/14 1500  BP: 144/77  112/52 158/84  Pulse: 97  95 96  Temp:  99.1 F (37.3 C)    TempSrc:  Oral    Resp: 14  15 17   Height:      Weight:      SpO2: 94%  94% 95%    Intake/Output Summary (Last 24 hours) at 08/12/14 1744 Last data filed at 08/12/14 0900  Gross per 24 hour  Intake    150 ml  Output    500 ml  Net   -350 ml   Filed Weights   08/03/14 0800 08/04/14 0400 08/10/14 0400  Weight: 93 kg (205 lb 0.4 oz) 94.9 kg (209 lb 3.5 oz) 93 kg (205 lb 0.4 oz)     Exam:  General exam: Moderately built and nourished ill-looking male sitting propped up in bed in no obvious distress Respiratory system: Reduced breath sounds in the bases/poor inspiratory effort. Rest of lung fields clear to auscultation.. No increased work of breathing. Cardiovascular system: S1 & S2 heard, RRR. No JVD, murmurs, gallops, clicks. Trace ankle edema. Telemetry: Sinus rhythm Gastrointestinal system: Abdomen is distended but soft and nontender. No fluid thrill. Normal bowel sounds heard. Central nervous system: This morning-Drowsy, easily arousable but drifts back to sleep. Does not follow instructions. Drooling No focal neurological deficits. Extremities: Symmetric 5 x 5 power.   Data Reviewed: Basic Metabolic Panel:  Recent Labs Lab 08/06/14 0500  08/07/14 0400 08/08/14 0434 08/09/14 0630 08/10/14 0510 08/11/14 0545  NA 142 140 135 134* 138 138  K 3.6 4.3 4.2 4.7 4.0 3.6  CL 113* 111 105 106 107 105  CO2 24 24 26 25 26 26   GLUCOSE 145* 135* 141* 123* 107* 115*  BUN 14 10 10  21* 24* 19  CREATININE 0.68 0.49* 0.56* 1.03 0.55* 0.58*  CALCIUM 8.2* 7.9* 7.9* 7.5* 7.6* 7.4*  MG 2.1 2.0 1.8 2.3 2.1  --    Liver Function Tests:  Recent Labs Lab 08/07/14 0400 08/08/14 0434 08/09/14 0630 08/10/14 0510 08/11/14 0545  AST 97* 77* 74* 79* 80*  ALT 39 38 37 40 38  ALKPHOS 93 83 70 81 82  BILITOT 2.8* 2.7* 2.2* 6.0* 3.4*  PROT 6.2* 6.1* 5.3* 6.0* 6.0*  ALBUMIN 2.6* 2.5* 2.3* 2.6* 2.5*   No results for input(s): LIPASE, AMYLASE in the last 168 hours.  Recent Labs Lab 08/07/14 0450 08/08/14 0434 08/09/14 0630 08/10/14 0610 08/11/14 0621  AMMONIA 54* 53* 65* 87* 65*   CBC:  Recent Labs Lab 08/09/14 0630 08/09/14 2056 08/10/14 0510 08/10/14 0832 08/11/14 0545 08/12/14 0019 08/12/14 0417  WBC 12.9* 12.7* 13.7*  --  10.3  --  9.6  NEUTROABS  --   --  8.1*  --  5.9  --   --  HGB 5.6* 8.5* 8.5* 8.4* 7.9* 7.6* 7.4*  HCT 17.2* 26.0* 25.6*  --  23.9* 23.3* 23.4*  MCV 99.4 91.9 92.8  --  94.1  --  94.7  PLT 173 158 155  --  141*  --  144*   Cardiac Enzymes: No results for input(s): CKTOTAL, CKMB, CKMBINDEX, TROPONINI in the last 168 hours. BNP (last 3 results) No results for input(s): PROBNP in the last 8760 hours. CBG:  Recent Labs Lab 08/08/14 0805 08/09/14 0740 08/10/14 0821 08/11/14 0824 08/12/14 0818  GLUCAP 134* 113* 99 103* 108*    Recent Results (from the past 240 hour(s))  MRSA PCR Screening     Status: None   Collection Time: 08/03/14  8:04 AM  Result Value Ref Range Status   MRSA by PCR NEGATIVE NEGATIVE Final    Comment:        The GeneXpert MRSA Assay (FDA approved for NASAL specimens only), is one component of a comprehensive MRSA colonization surveillance program. It is not intended  to diagnose MRSA infection nor to guide or monitor treatment for MRSA infections.   Urine culture     Status: None   Collection Time: 08/04/14  1:08 PM  Result Value Ref Range Status   Specimen Description URINE, CATHETERIZED  Final   Special Requests NONE  Final   Culture   Final    NO GROWTH 2 DAYS Performed at [facility]    Report Status 08/06/2014 FINAL  Final  Culture, blood (routine x 2)     Status: None (Preliminary result)   Collection Time: 08/10/14  9:30 AM  Result Value Ref Range Status   Specimen Description BLOOD LEFT ARM  Final   Special Requests BOTTLES DRAWN AEROBIC ONLY 3CC  Final   Culture   Final    NO GROWTH 2 DAYS Performed at [facility]    Report Status PENDING  Incomplete  Culture, blood (routine x 2)     Status: None (Preliminary result)   Collection Time: 08/10/14  9:35 AM  Result Value Ref Range Status   Specimen Description BLOOD LEFT HAND  Final   Special Requests BOTTLES DRAWN AEROBIC ONLY 1CC  Final   Culture   Final    NO GROWTH 2 DAYS Performed at [facility]    Report Status PENDING  Incomplete  Culture, Urine     Status: None   Collection Time: 08/10/14  2:04 PM  Result Value Ref Range Status   Specimen Description URINE, CATHETERIZED  Final   Special Requests NONE  Final   Culture   Final    NO GROWTH 2 DAYS Performed at [facility]    Report Status 08/12/2014 FINAL  Final           Studies: No results found.      Scheduled Meds: . folic acid  1 mg Oral Daily  . lactulose  20 g Oral TID  . metoprolol  5 mg Intravenous 3 times per day  . multivitamin with minerals  1 tablet Oral Daily  . pantoprazole (PROTONIX) IV  40 mg Intravenous Q12H  . QUEtiapine  25 mg Oral TID  . rifaximin  550 mg Oral BID  . sodium chloride  10-40 mL Intracatheter Q12H  . sodium chloride  3 mL Intravenous Q12H  . thiamine  100 mg Oral Daily   Or  . thiamine  100 mg Intravenous Daily   Continuous  Infusions:   Principal Problem:  Alcohol abuse Active Problems:   Hypertension   Hyperlipidemia   Cardiac arrest   Acute encephalopathy   Rectal bleeding   Chronic diastolic CHF (congestive heart failure)   GIB (gastrointestinal bleeding)   Abdominal pain   GI bleed   Encephalopathy, hepatic   Hepatic cirrhosis   Acute blood loss anemia   Adynamic ileus   Hypernatremia   Leukocytosis   Esophageal varices in alcoholic cirrhosis   SBO (small bowel obstruction)   Esophageal varices with bleeding   Cirrhosis   Ileus   Rectal varices   Stricture, duodenum    Time spent: 30 minutes.    Marcellus Scott, MD, FACP, FHM. [facility] Pager [phone]  If 7PM-7AM, please contact night-coverage [email or web address] Password [facility] 08/12/2014, 5:44 PM    LOS: 9 days

## 2014-08-12 NOTE — Progress Notes (Signed)
Occupational Therapy Treatment Patient Details Name: Keith [name] MRN: [id number] DOB: 06-29-1940 Today's Date: 08/12/2014    History of present illness Keith [name] is a 74 y.o. male with PMH of hypertension, hyperlipidemia, diastolic congestive heart failure, history of GI bleeding, alcohol abuse, who presents with rectal bleeding, altered mental status and abdominal pain; He has SBO of unknown etiology; Pt has been unable to mobilize d/t sedation (d/t hx of ETOH), he can't have NGT d/t  esophageal varices pe rdiscussion wtih MD;    OT comments  Pt improved from yesterday.  Still some confusion, but pt redirectable and reoriented.  Participated well.  Follow Up Recommendations  Home health OT    Equipment Recommendations  3 in 1 bedside comode    Recommendations for Other Services      Precautions / Restrictions Precautions Precautions: Fall Restrictions Weight Bearing Restrictions: No       Mobility Bed Mobility Overal bed mobility: Needs Assistance Bed Mobility: Supine to Sit     Supine to sit: Min assist;+2 for physical assistance;+2 for safety/equipment;HOB elevated     General bed mobility comments: Assist for trunk and bil LEs. Max multimodal cueing for safety, technique. Increased time.   Transfers Overall transfer level: Needs assistance Equipment used: 2 person hand held assist Transfers: Stand Pivot Transfers Sit to Stand: Mod assist;+2 physical assistance Stand pivot transfers: Min assist;+2 physical assistance;+2 safety/equipment       General transfer comment: x2. Assist to rise, stabilize, control descent.  Multimodal cueing for safety, technique.     Balance   Sitting-balance support: Feet supported Sitting balance-Leahy Scale: Good     Standing balance support: During functional activity Standing balance-Leahy Scale: Fair                     ADL Overall ADL's : Needs assistance/impaired     Grooming: Wash/dry  face;Set up;Bed level                   Toilet Transfer: Moderate assistance;Minimal assistance;+2 for safety/equipment;Stand-pivot (mod A sit to stand and min A pivotal steps to chair)                    Vision                     Perception     Praxis      Cognition   Behavior During Therapy: Flat affect Overall Cognitive Status: Impaired/Different from baseline Area of Impairment: Following commands;Problem solving;Safety/judgement Orientation Level: Disoriented to;Place;Time;Situation Current Attention Level: Sustained    Following Commands: Follows one step commands consistently Safety/Judgement: Decreased awareness of safety;Decreased awareness of deficits Awareness: Intellectual Problem Solving: Requires tactile cues;Requires verbal cues;Decreased initiation;Slow processing General Comments: knew RN was coming in to help him bathe, when encouraged to take a few steps after a rest break, recalled that he said he didn't want to previously.  Initially said "FBI is coming to visit.  Verbalizes that he feels depressed    Extremity/Trunk Assessment               Exercises     Shoulder Instructions       General Comments      Pertinent Vitals/ Pain       Pain Assessment: No/denies pain  Home Living  Prior Functioning/Environment              Frequency Min 2X/week     Progress Toward Goals  OT Goals(current goals can now be found in the care plan section)  Progress towards OT goals: Progressing toward goals     Plan      Co-evaluation      Reason for Co-Treatment: For patient/therapist safety PT goals addressed during session: Mobility/safety with mobility OT goals addressed during session: ADL's and self-care      End of Session     Activity Tolerance Patient tolerated treatment well   Patient Left in chair;with call bell/phone within reach;with  family/visitor present (sitter stepped out; touched base with RN; maximove pad place)   Nurse Communication Mobility status        Time: 1440-1504 OT Time Calculation (min): 24 min  Charges: OT General Charges $OT Visit: 1 Procedure OT Treatments $Therapeutic Activity: 8-22 mins  Melik Blancett 08/12/2014, 3:48 PM  Marica Otter, OTR/L [phone] 08/12/2014

## 2014-08-12 NOTE — Care Management Note (Signed)
Case Management Note  Patient Details  Name: Keith [name] MRN: [id number] Date of Birth: 1941-01-10  Subjective/Objective:                 Gi esophageal varceal bleeds   Action/Plan:   Expected Discharge Date:   Crissie Figures)    [id number]           Expected Discharge Plan:  Home/Self Care  In-House Referral:  NA  Discharge planning Services  CM Consult  Post Acute Care Choice:  NA Choice offered to:  NA  DME Arranged:  N/A DME Agency:  NA  HH Arranged:  RN, PT, OT, Nurse's Aide HH Agency:  [facility]  Status of Service:  In process, will continue to follow  Medicare Important Message Given:    Date Medicare IM Given:    Medicare IM give by:    Date Additional Medicare IM Given:    Additional Medicare Important Message give by:     If discussed at Long Length of Stay Meetings, dates discussed:  [id number] Additional Comments:  Golda Acre, RN 08/12/2014, 10:17 AM

## 2014-08-12 NOTE — Progress Notes (Signed)
Physical Therapy Treatment Patient Details Name: Keith [name] MRN: [id number] DOB: 09/19/1940 Today's Date: 08/12/2014    History of Present Illness Keith [name] is a 74 y.o. male with PMH of hypertension, hyperlipidemia, diastolic congestive heart failure, history of GI bleeding, alcohol abuse, who presents with rectal bleeding, altered mental status and abdominal pain; He has SBO of unknown etiology; Pt has been unable to mobilize d/t sedation (d/t hx of ETOH), he can't have NGT d/t  esophageal varices pe rdiscussion wtih MD;     PT Comments    Progressing slowly with mobility. Pt's cognition and mobility has improved since last session. Pt participated well until encouraged to ambulation. Pt declined ambulation beyond a few steps despite encouragement from therapists and family.   Follow Up Recommendations  Home health PT;Supervision/Assistance - 24 hour (family refusing SNF)     Equipment Recommendations   (to be determined)    Recommendations for Other Services OT consult     Precautions / Restrictions Precautions Precautions: Fall Restrictions Weight Bearing Restrictions: No    Mobility  Bed Mobility Overal bed mobility: Needs Assistance Bed Mobility: Supine to Sit     Supine to sit: Min assist;+2 for physical assistance;+2 for safety/equipment;HOB elevated     General bed mobility comments: Assist for trunk and bil LEs. Max multimodal cueing for safety, technique. Increased time.   Transfers Overall transfer level: Needs assistance Equipment used: 2 person hand held assist Transfers: Stand Pivot Transfers Sit to Stand: Mod assist;+2 physical assistance;+2 safety/equipment Stand pivot transfers: Min assist;+2 physical assistance;+2 safety/equipment       General transfer comment: x2. Assist to rise, stabilize, control descent.  Multimodal cueing for safety, technique.   Ambulation/Gait Ambulation/Gait assistance: Min assist;+2 physical  assistance;+2 safety/equipment Ambulation Distance (Feet): 5 Feet Assistive device: 2 person hand held assist Gait Pattern/deviations: Step-through pattern;Decreased stride length     General Gait Details: Max encouragement for progression of activity/ambulation however pt would not agree to walk any farther.    Stairs            Wheelchair Mobility    Modified Rankin (Stroke Patients Only)       Balance   Sitting-balance support: Feet supported Sitting balance-Leahy Scale: Good     Standing balance support: During functional activity Standing balance-Leahy Scale: Fair                      Cognition Arousal/Alertness: Awake/alert Behavior During Therapy: Flat affect Overall Cognitive Status: Impaired/Different from baseline Area of Impairment: Following commands;Problem solving;Safety/judgement       Following Commands: Follows one step commands consistently Safety/Judgement: Decreased awareness of safety;Decreased awareness of deficits Awareness: Intellectual Problem Solving: Requires tactile cues;Requires verbal cues;Decreased initiation;Slow processing      Exercises      General Comments        Pertinent Vitals/Pain Pain Assessment: No/denies pain    Home Living                      Prior Function            PT Goals (current goals can now be found in the care plan section) Progress towards PT goals: Progressing toward goals    Frequency  Min 3X/week    PT Plan Current plan remains appropriate    Co-evaluation   Reason for Co-Treatment: For patient/therapist safety         End of Session Equipment Utilized During Treatment: Gait belt  Activity Tolerance: Patient tolerated treatment well Patient left: in chair;with call bell/phone within reach;with family/visitor present     Time: 1610-9604 PT Time Calculation (min) (ACUTE ONLY): 24 min  Charges:  $Therapeutic Activity: 8-22 mins                    G Codes:       Rebeca Alert, MPT Pager: [phone]

## 2014-08-12 NOTE — Clinical Social Work Psych Note (Signed)
Clinical Social Worker Psych Service Line Progress Note  Clinical Social Worker: Boone Master, Jeddo Date/Time: 08/12/2014, 2:59 PM   Review of Patient  Overall Medical Condition:  Patient remains in step-down unit at this time.  Participation Level:  Minimal Participation Quality: Drowsy Other Participation Quality:  Due to confusion, patient unable to fully participate in assessment. Patient's confusion did improve from assessment on 6/22.  Affect: Flat Cognitive: Confused Reaction to Medications/Concerns:  Family reports that patient was "loopy" and sedated after receiving Seroquel and Haldol last night. Family reports that confusion is improving this afternoon.  Modes of Intervention: Solution-focused   Summary of Progress/Plan at Discharge  Summary of Progress/Plan at Discharge:  CSW met with patient and family Genella Rife) at bedside. Patient laying in bed. Family and sitter report that patient had to be redirected last night and was trying to get out of bed and required medication. Family reports patient became "loopy" and thought he might have been hallucinating. Patient slept from 7am-1pm today. Family reports patient has been participating in conversation this afternoon and appears to be less confused. Patient able to indentify the year, president, his age and birth date and where he is. Patient able to state he is in the hospital but a few minutes later believed he was at home and someone was in his kitchen. Family present reports dtr continues to be primary caregiver but wants patient to return home with 24/7 caregivers. CSW staffed case with psych MD and will continue to follow.  [place], [place] [phone]

## 2014-08-13 ENCOUNTER — Inpatient Hospital Stay (HOSPITAL_COMMUNITY): Payer: Medicare Other

## 2014-08-13 DIAGNOSIS — K7030 Alcoholic cirrhosis of liver without ascites: Secondary | ICD-10-CM

## 2014-08-13 LAB — CBC
HCT: 24.1 % — ABNORMAL LOW (ref 39.0–52.0)
Hemoglobin: 7.8 g/dL — ABNORMAL LOW (ref 13.0–17.0)
MCH: 30.8 pg (ref 26.0–34.0)
MCHC: 32.4 g/dL (ref 30.0–36.0)
MCV: 95.3 fL (ref 78.0–100.0)
PLATELETS: 153 10*3/uL (ref 150–400)
RBC: 2.53 MIL/uL — ABNORMAL LOW (ref 4.22–5.81)
RDW: 18.2 % — AB (ref 11.5–15.5)
WBC: 10.9 10*3/uL — ABNORMAL HIGH (ref 4.0–10.5)

## 2014-08-13 LAB — COMPREHENSIVE METABOLIC PANEL
ALK PHOS: 99 U/L (ref 38–126)
ALT: 37 U/L (ref 17–63)
AST: 77 U/L — ABNORMAL HIGH (ref 15–41)
Albumin: 2.7 g/dL — ABNORMAL LOW (ref 3.5–5.0)
Anion gap: 8 (ref 5–15)
BUN: 13 mg/dL (ref 6–20)
CO2: 27 mmol/L (ref 22–32)
Calcium: 8 mg/dL — ABNORMAL LOW (ref 8.9–10.3)
Chloride: 106 mmol/L (ref 101–111)
Creatinine, Ser: 0.67 mg/dL (ref 0.61–1.24)
GFR calc Af Amer: >60 mL/min (ref >60)
GFR calc non Af Amer: >60 mL/min (ref >60)
GLUCOSE: 129 mg/dL — AB (ref 65–99)
Potassium: 3.4 mmol/L — ABNORMAL LOW (ref 3.5–5.1)
Sodium: 141 mmol/L (ref 135–145)
Total Bilirubin: 2.5 mg/dL — ABNORMAL HIGH (ref 0.3–1.2)
Total Protein: 6.3 g/dL — ABNORMAL LOW (ref 6.5–8.1)

## 2014-08-13 LAB — GLUCOSE, CAPILLARY
GLUCOSE-CAPILLARY: 142 mg/dL — AB (ref 65–99)
Glucose-Capillary: 134 mg/dL — ABNORMAL HIGH (ref 65–99)

## 2014-08-13 MED ORDER — QUETIAPINE FUMARATE 25 MG PO TABS
25.0000 mg | ORAL_TABLET | Freq: Two times a day (BID) | ORAL | Status: DC
  Administered 2014-08-13 – 2014-08-16 (×6): 25 mg via ORAL
  Filled 2014-08-13 (×6): qty 1

## 2014-08-13 MED ORDER — LORAZEPAM 2 MG/ML IJ SOLN: 2.0000 mg | Freq: Once | INTRAMUSCULAR | Status: DC

## 2014-08-13 MED ORDER — POTASSIUM CHLORIDE CRYS ER 20 MEQ PO TBCR
40.0000 meq | EXTENDED_RELEASE_TABLET | Freq: Once | ORAL | Status: AC
  Administered 2014-08-13: 40 meq via ORAL
  Filled 2014-08-13: qty 2

## 2014-08-13 NOTE — Progress Notes (Signed)
Clinical Social Work  CSW went to room to meet with patient but patient currently using restroom. Dtr [facility]) asked to speak with CSW in waiting room.   Dtr reports patient did better last night and slept from 7pm-11pm and then 12am-3am. Patient was not trying to get out of bed and listened to dtr's requests. Dtr reports concern due to patient experiencing visual hallucinations. Patient has told dtr that he sees ants crawling on the wall, people shooting in the corner of the room, and has been talking to friends that are not actually present in the room. Patient remains confused but has periods of clarity. Dtr reports that patient appears to be healing phyiscally but she is worried about his substance use when he returns home.  While talking, patient's friend, Sharma Covert arrived and joined assessment. Dtr reports Sharma Covert is patient's best friend and assists with caring for patient. Dtr and friend agree that patient will not want to drink once he returns home but once he begins feeling better will crave alcohol again. Dtr reports patient was scared at last hospital stay and was sober for about 1 month prior to starting drinking again. Dtr reports that she and friend want patient to stay sober to avoid and further medical complications.  CSW and family had discussion about goals at DC. CSW offered SA resources for residential treatment, intensive outpatient (IOP), outpatient counseling, and AA meetings. Dtr reports that patient was never involved or interactive during [employer] Patient signed up to go to IOP through [facility] a few years ago but decided to not attend because he did not feel he could connect with a younger age group. CSW explained that several facilities offer IOP and patient could choose where he wanted to go for treatment. Dtr reports she and friend have removed all alcohol from the house and friend agreed to come and assist as well. Friend believes he can talk patient into allowing family to  handle all expenses and not drive. Family is willing to hire 24/7 carergivers but understand that it will not be hired help's responsibility to ensure that patient is not buying alcohol. Friend is agreeable to speak with patient's friends in order to explain severity of situation and to encourage friends to not provide alcohol or partake in drinking with patient. Friend reports that they would not allow patient to go shopping on his own and would ask patient if he felt comfortable moving away so that he would not feel tempted to drink alcohol.  CSW explained that once patient is more oriented then CSW could assist with these conversations and explain treatment options to patient as well. Dtr and friend appreciative of information and agreeable to call CSW with any further concerns.  CSW will staff case with psych MD and will continue to follow.  [place], [place] [id number]

## 2014-08-13 NOTE — Progress Notes (Signed)
    Progress Note   Subjective  sitting up eating breakfast, no complaints   Objective   Vital signs in last 24 hours: Temp:  [98.6 F (37 C)-99.4 F (37.4 C)] 98.6 F (37 C) (06/24 0723) Pulse Rate:  [89-102] 97 (06/24 0500) Resp:  [14-24] 19 (06/24 0500) BP: (107-158)/(52-84) 124/58 mmHg (06/24 0500) SpO2:  [92 %-98 %] 92 % (06/24 0500) Last BM Date: 08/12/14 General:    Black male in NAD Abdomen:  Soft, moderately distended with tympany but some normal bowel sounds. Extremities:  Without edema. Neurologic:  Alert and oriented today Psych:  Cooperative. Normal mood and affect.     Lab Results:  Recent Labs  08/11/14 0545 08/12/14 0019 08/12/14 0417 08/13/14 0450  WBC 10.3  --  9.6 10.9*  HGB 7.9* 7.6* 7.4* 7.8*  HCT 23.9* 23.3* 23.4* 24.1*  PLT 141*  --  144* 153   BMET  Recent Labs  08/11/14 0545 08/13/14 0450  NA 138 141  K 3.6 3.4*  CL 105 106  CO2 26 27  GLUCOSE 115* 129*  BUN 19 13  CREATININE 0.58* 0.67  CALCIUM 7.4* 8.0*   LFT  Recent Labs  08/13/14 0450  PROT 6.3*  ALBUMIN 2.7*  AST 77*  ALT 37  ALKPHOS 99  BILITOT 2.5*    Studies/Results: Dg Abd Portable 1v  08/13/2014   CLINICAL DATA:  Ileus.  EXAM: PORTABLE ABDOMEN - 1 VIEW  COMPARISON:  08/10/2014.  FINDINGS: Soft tissue structures are unremarkable. Continued improvement of small and large bowel distention. Findings consistent with improving adynamic ileus. No pathologic intra-abdominal calcifications noted. No acute bony abnormality.  IMPRESSION: Findings consistent with continued improvement of adynamic ileus.   Electronically Signed   By: Maisie Fus  Register   On: 08/13/2014 08:00     Assessment / Plan:   1. 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 Encephalopathy significantly improved today. He is sitting up, eating breakfast and answering questions appropriately. Continue TID lactulose and Xifaxan.  Psych evaluated, stopped Haldol.  Hopefully he can ambulate with assistance soon. He wants to walk to bathroom.  2. Ileus. Abdominal films today showed continued improvement. Clinically doing well without nausea. Tolerating breakfast.   3. Anemia of acute blood loss, improved post transfusion    LOS: 10 days   Willette Cluster  08/13/2014, 8:53 AM   [facility] Attending  I have also seen and assessed the patient and agree with the advanced practitioner's assessment and plan. My Hx and PE same. He looks great today. Hopefully out to floor soon. Will need outpatient GI f/u US Christella Hartigan) and a repeat EGD and variceal banding in 2-4 weeks. Would not do a beta blocker just yet and may wait on that until better overall.  Iva Boop, MD, [facility] [phone] (pager) 08/13/2014 6:26 PM

## 2014-08-13 NOTE — Progress Notes (Signed)
Pt transported to [facility]. Pt V/S stable. Transferred via wheelchair and on telemetry. All pt belongings including hearing aid and glasses transported with pt. Pt daughter present upon transfer. Report, pt chart, and pt medications all given to receiving nurse.

## 2014-08-13 NOTE — Consult Note (Signed)
Psychiatry Consult follow-up  Reason for Consult:  Alcohol abuse vs dependence with multiple medical complications and acute encephalopathy and gastric bleeding. Referring Physician:  Dr. Wendee Beavers Patient Identification: Keith [name] MRN:  [id number] Principal Diagnosis: Alcohol abuse Diagnosis:   Patient Active Problem List   Diagnosis Date Noted  . Ileus [K56.7]   . 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 Cirrhosis [K74.60]   . SBO (small bowel obstruction) [K56.69] 08/08/2014  . Leukocytosis [D72.829]   . 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 Adynamic ileus [K56.0] 08/05/2014  . Hypernatremia [E87.0] 08/05/2014  . GI bleed [K92.2] 08/04/2014  . Encephalopathy, hepatic [K72.90] 08/04/2014  . Hepatic cirrhosis [K74.60] 08/04/2014  . Acute blood loss anemia [D62]   . Lower GI bleed [K92.2] 08/03/2014  . Acute encephalopathy [G93.40] 08/03/2014  . Rectal bleeding [K62.5] 08/03/2014  . Alcohol abuse [F10.10] 08/03/2014  . GIB (gastrointestinal bleeding) [K92.2] 08/03/2014  . Chronic diastolic CHF (congestive heart failure) [I50.32]   . Abdominal pain [R10.9]   . Atrial fibrillation [I48.91] 07/15/2012  . Fever, unspecified [R50.9] 06/17/2012  . Airway obstruction/recurrent [J98.8] 06/14/2012  . 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 Fracture of rib of right side [S22.31XA] 06/14/2012  . Fracture, sternum closed [S22.20XA] 06/14/2012  . Cardiac arrest [I46.9] 05/31/2012  . Bleeding [R58] 05/31/2012  . Tracheostomy complication [T59.74] 16/38/4536  . Fracture of multiple ribs [S22.49XA] 05/31/2012  . Sternal fracture [S22.20XA] 05/31/2012  . Acute respiratory failure [J96.00] 05/31/2012  . Laryngeal edema [J38.4] 05/22/2012  . Hypertension [I10] 05/22/2012  . Hyperlipidemia [E78.5] 05/22/2012  . Habitual alcohol use [F10.10] 05/22/2012    Total Time spent with patient: 30  minutes  Subjective:   Keith [name] is a 74 y.o. male patient admitted with alcohol abuse, rectal bleeding and agitation during last night.  HPI:  Keith [name] is a 74 y.o. male seen face-to-face for psychiatric consultation and evaluation along with the psychiatric social service and also reviewed available medical records. Reportedly patient become irritable as stated and trying to get out of bed last night and required more medication management. Patient daughter who is at bedside who is able to provide collateral information in this case. Reportedly patient suffered with multiple medical problems including gastrointestinal bleeding. Patient is not a reliable historian because of confusion at this time. Patient daughter reported that he has been drinking liquor regularly over several years. Patient has no interest in quitting drinking alcohol. Patient daughter stated she moved into his home to care for him about 3 years ago secondary to knee become physically sick and disabled at that time and also required tracheostomy. Patient has no previous history of acute psychiatric hospitalization. Patient denies current symptoms of depression, anxiety, mania, psychosis. Patient has no current suicidal/homicidal ideation, intention or plans. Patient stated that he cannot drink anymore because of his current medical condition and patient daughter stated that they are looking for 24-hour supervision and home health care at home when medically stable.  Interval history: Patient appeared more stable emotionally and has a clear sensorium without any confusion, agitation or aggressive behaviors. Patient has been calm and cooperative. Patient daughter is present Patient bed and expressed her satisfaction with the his clinical improvement both emotionally, cognitively and physically. Patient reported patient has no intention to stop drinking or placement of out of home. Patient has been counseled and  encouraged to stay sober due to  decreased physical health. Patient medication Seroquel can be tapered off in few weeks if he continued to maintain his cognitive health.  Past Medical History:  Past Medical History  Diagnosis Date  . HTN (hypertension)   . Hypercholesterolemia   . Alcohol abuse 2014  . A-fib 05/2012    in the setting of acute medical illness/ respiratory arrest. Not placed on anticoagulant.   . History of GI bleed ~ 1990    bleeding from ulcer  . History of cardiac arrest 05/2012    due to tracheostomy site bleeding and airway obstruction  . Angioedema 05/2012    with acute resp failure and hypoxia due to Lisinopril  . Respiratory arrest 05/2012    caused by tracheostomy site bleeding and subsequent airway obstruciton.   . Anemia 05/2012, 07/2014.     normocytic 2014, macrocytic 2016  . Cirrhosis 07/2014    Past Surgical History  Procedure Laterality Date  . Tracheostomy  05/22/12    emergent, angioedema  . Tracheostomy tube placement N/A 05/31/2012    Procedure: TRACHEOSTOMY;  Surgeon: Jodi Marble, MD;  Location: [place];  Service: ENT;  Laterality: N/A;  . Laryngoscopy N/A 05/31/2012    Procedure: LARYNGOSCOPY;  Surgeon: Jodi Marble, MD;  Location: [place];  Service: ENT;  Laterality: N/A;  . Laryngoscopy N/A 06/19/2012    Procedure: DIRECT LARYNGOSCOPY AND BRONCHOSCOPY;  Surgeon: Ruby Cola, MD;  Location: [place];  Service: ENT;  Laterality: N/A;  . Tracheostomy revision N/A 06/19/2012    Procedure: TRACHEOSTOMY DECANNULATION;  Surgeon: Ruby Cola, MD;  Location: [place];  Service: ENT;  Laterality: N/A;  [place]  . Esophagogastroduodenoscopy N/A 08/07/2014    Procedure: ESOPHAGOGASTRODUODENOSCOPY (EGD);  Surgeon: Milus Banister, MD;  Location: [facility];  Service: Endoscopy;  Laterality: N/A;  . Esophagogastroduodenoscopy N/A 08/10/2014    Procedure: ESOPHAGOGASTRODUODENOSCOPY (EGD);  Surgeon: Gatha Mayer, MD;  Location: [facility];  Service: Endoscopy;  Laterality: N/A;  bedside   . Flexible sigmoidoscopy N/A 08/10/2014    Procedure: FLEXIBLE SIGMOIDOSCOPY;  Surgeon: Gatha Mayer, MD;  Location: [facility];  Service: Endoscopy;  Laterality: N/A;  bedside   Family History:  Family History  Problem Relation Age of Onset  . Breast cancer Mother   . Stroke Father    Social History:  History  Alcohol Use  . 0.0 oz/week  . 0 Standard drinks or equivalent per week    Comment: drinks daily. in 07/2012 drinking of 16-24 oz vodka daily reported.      History  Drug Use No    History   Social History  . Marital Status: Single    Spouse Name: N/A  . Number of Children: N/A  . Years of Education: N/A   Social History Main Topics  . Smoking status: Never Smoker   . Smokeless tobacco: Not on file  . Alcohol Use: 0.0 oz/week    0 Standard drinks or equivalent per week     Comment: drinks daily. in 07/2012 drinking of 16-24 oz vodka daily reported.   . Drug Use: No  . Sexual Activity: Not on file   Other Topics Concern  . None   Social History Narrative   Additional Social History:                          Allergies:   Allergies  Allergen Reactions  . Lisinopril Other (See Comments)    Presented with laryngeal edema -  likely caused is lisinopril   . Amlodipine Swelling    Labs:  Results for orders placed or performed during the hospital encounter of 08/03/14 (from the past 48 hour(s))  Hemoglobin and hematocrit, blood     Status: Abnormal   Collection Time: 08/12/14 12:19 AM  Result Value Ref Range   Hemoglobin 7.6 (L) 13.0 - 17.0 g/dL   HCT 23.3 (L) 39.0 - 52.0 %  CBC     Status: Abnormal   Collection Time: 08/12/14  4:17 AM  Result Value Ref Range   WBC 9.6 4.0 - 10.5 K/uL   RBC 2.47 (L) 4.22 - 5.81 MIL/uL   Hemoglobin 7.4 (L) 13.0 - 17.0 g/dL   HCT 23.4 (L) 39.0 - 52.0 %   MCV 94.7 78.0 - 100.0 fL   MCH 30.0 26.0 - 34.0 pg   MCHC 31.6 30.0 - 36.0 g/dL   RDW 18.2 (H)  11.5 - 15.5 %   Platelets 144 (L) 150 - 400 K/uL  Glucose, capillary     Status: Abnormal   Collection Time: 08/12/14  8:18 AM  Result Value Ref Range   Glucose-Capillary 108 (H) 65 - 99 mg/dL   Comment 1 Notify RN    Comment 2 Document in Chart   CBC     Status: Abnormal   Collection Time: 08/13/14  4:50 AM  Result Value Ref Range   WBC 10.9 (H) 4.0 - 10.5 K/uL   RBC 2.53 (L) 4.22 - 5.81 MIL/uL   Hemoglobin 7.8 (L) 13.0 - 17.0 g/dL   HCT 24.1 (L) 39.0 - 52.0 %   MCV 95.3 78.0 - 100.0 fL   MCH 30.8 26.0 - 34.0 pg   MCHC 32.4 30.0 - 36.0 g/dL   RDW 18.2 (H) 11.5 - 15.5 %   Platelets 153 150 - 400 K/uL  Comprehensive metabolic panel     Status: Abnormal   Collection Time: 08/13/14  4:50 AM  Result Value Ref Range   Sodium 141 135 - 145 mmol/L   Potassium 3.4 (L) 3.5 - 5.1 mmol/L   Chloride 106 101 - 111 mmol/L   CO2 27 22 - 32 mmol/L   Glucose, Bld 129 (H) 65 - 99 mg/dL   BUN 13 6 - 20 mg/dL   Creatinine, Ser 0.67 0.61 - 1.24 mg/dL   Calcium 8.0 (L) 8.9 - 10.3 mg/dL   Total Protein 6.3 (L) 6.5 - 8.1 g/dL   Albumin 2.7 (L) 3.5 - 5.0 g/dL   AST 77 (H) 15 - 41 U/L   ALT 37 17 - 63 U/L   Alkaline Phosphatase 99 38 - 126 U/L   Total Bilirubin 2.5 (H) 0.3 - 1.2 mg/dL   GFR calc non Af Amer >60 >60 mL/min   GFR calc Af Amer >60 >60 mL/min    Comment: (NOTE) The eGFR has been calculated using the CKD EPI equation. This calculation has not been validated in all clinical situations. eGFR's persistently <60 mL/min signify possible Chronic Kidney Disease.    Anion gap 8 5 - 15  Glucose, capillary     Status: Abnormal   Collection Time: 08/13/14  7:21 AM  Result Value Ref Range   Glucose-Capillary 134 (H) 65 - 99 mg/dL   Comment 1 Notify RN   Glucose, capillary     Status: Abnormal   Collection Time: 08/13/14 11:31 AM  Result Value Ref Range   Glucose-Capillary 142 (H) 65 - 99 mg/dL    Vitals: Blood pressure 170/85, pulse  107, temperature 98.6 F (37 C), temperature source  Oral, resp. rate 21, height $RemoveBe'5\' 8"'LmcMGMJYk$  (1.727 m), weight 93 kg (205 lb 0.4 oz), SpO2 97 %.  Risk to Self: Is patient at risk for suicide?: No Risk to Others:   Prior Inpatient Therapy:   Prior Outpatient Therapy:    Current Facility-Administered Medications  Medication Dose Route Frequency Provider Last Rate Last Dose  . EPINEPHrine (EPI-PEN) injection 0.3 mg  0.3 mg Intramuscular PRN Ivor Costa, MD      . fluticasone (FLONASE) 50 MCG/ACT nasal spray 1 spray  1 spray Each Nare BID PRN Ivor Costa, MD   1 spray at 08/13/14 1522  . folic acid (FOLVITE) tablet 1 mg  1 mg Oral Daily Ivor Costa, MD   1 mg at 08/13/14 1006  . hydrALAZINE (APRESOLINE) injection 10 mg  10 mg Intravenous Q6H PRN Eugenie Filler, MD   10 mg at 08/06/14 1909  . ipratropium (ATROVENT) 0.06 % nasal spray 2 spray  2 spray Nasal QID PRN Ivor Costa, MD      . ipratropium-albuterol (DUONEB) 0.5-2.5 (3) MG/3ML nebulizer solution 3 mL  3 mL Nebulization Q4H PRN Ivor Costa, MD      . lactulose (CHRONULAC) 10 GM/15ML solution 20 g  20 g Oral TID Willia Craze, NP   20 g at 08/13/14 1006  . metoprolol (LOPRESSOR) injection 5 mg  5 mg Intravenous 3 times per day Eugenie Filler, MD   5 mg at 08/13/14 0457  . multivitamin with minerals tablet 1 tablet  1 tablet Oral Daily Ivor Costa, MD   1 tablet at 08/13/14 1006  . ondansetron (ZOFRAN) injection 4 mg  4 mg Intravenous Q6H PRN Ritta Slot, NP   4 mg at 08/07/14 1633  . pantoprazole (PROTONIX) injection 40 mg  40 mg Intravenous Q12H Albertine Patricia, MD   40 mg at 08/13/14 1006  . QUEtiapine (SEROQUEL) tablet 25 mg  25 mg Oral TID Ambrose Finland, MD   25 mg at 08/13/14 1006  . rifaximin (XIFAXAN) tablet 550 mg  550 mg Oral BID Willia Craze, NP   550 mg at 08/13/14 1009  . sodium chloride 0.9 % injection 10-40 mL  10-40 mL Intracatheter Q12H Eugenie Filler, MD   10 mL at 08/13/14 1007  . sodium chloride 0.9 % injection 10-40 mL  10-40 mL Intracatheter PRN Eugenie Filler, MD   10 mL at 08/07/14 1047  . sodium chloride 0.9 % injection 3 mL  3 mL Intravenous Q12H Ivor Costa, MD   3 mL at 08/13/14 1007  . thiamine (VITAMIN B-1) tablet 100 mg  100 mg Oral Daily Ivor Costa, MD   100 mg at 08/13/14 1006   Or  . thiamine (B-1) injection 100 mg  100 mg Intravenous Daily Ivor Costa, MD   100 mg at 08/10/14 1133    Musculoskeletal: Strength & Muscle Tone: decreased Gait & Station: unable to stand Patient leans: N/A  Psychiatric Specialty Exam: Physical Exam   R O S  Blood pressure 170/85, pulse 107, temperature 98.6 F (37 C), temperature source Oral, resp. rate 21, height $RemoveBe'5\' 8"'OcqousFiJ$  (1.727 m), weight 93 kg (205 lb 0.4 oz), SpO2 97 %.Body mass index is 31.18 kg/(m^2).  General Appearance: Casual  Eye Contact::  Fair  Speech:  Clear and Coherent  Volume:  Normal  Mood:  Euthymic  Affect:  Appropriate and Congruent  Thought Process:  Coherent and Goal Directed  Orientation:  Full (Time, Place, and Person)  Thought Content:  Rumination  Suicidal Thoughts:  No  Homicidal Thoughts:  No  Memory:  Immediate;   Good Recent;   Good  Judgement:  Impaired  Insight:  Lacking  Psychomotor Activity:  Decreased  Concentration:  Fair  Recall:  Poor  Fund of Knowledge:Fair  Language: Fair  Akathisia:  Negative  Handed:  Right  AIMS (if indicated):     Assets:  Communication Skills Desire for Improvement Financial Resources/Insurance Housing Leisure Time Resilience Social Support  ADL's:  Impaired  Cognition: WNL  Sleep:      Medical Decision Making: New problem, with additional work up planned, Review of Psycho-Social Stressors (1), Review or order clinical lab tests (1), Established Problem, Worsening (2), Review of Last Therapy Session (1), Review or order medicine tests (1), Review of Medication Regimen & Side Effects (2) and Review of New Medication or Change in Dosage (2)  Treatment Plan Summary: Patient appeared awake, alert and oriented to time place  person and situation. Patient has good motor with appropriate affect. Patient has normal speech and thought process. Patient has no confusion, irritability, agitation, restlessness or aggressive behavior. Daily contact with patient to assess and evaluate symptoms and progress in treatment and Medication management  Plan: Patient does meet criteria for capacity to make his own medical condition and living arrangements  Patient has no safety concerns  Patient has been cleared from subacute delirium and has cleared since sensorium during this evaluation Alcohol abuse versus dependence: Counseling and encouraged to stay sober Continue Seroquel 25 mg twice a day as needed for restlessness and agitation Patient does not meet criteria for psychiatric inpatient admission. Supportive therapy provided about ongoing stressors.  Appreciate psychiatric consultation anwe sign off at this time Please contact [phone] or [phone] if needs further assistance  Disposition: Patient will be discharged home when medically stable with the patient daughter  Durward Parcel. 08/13/2014 3:34 PM

## 2014-08-13 NOTE — Progress Notes (Signed)
[facility] PROGRESS NOTE  Keith [name] ZOX:[id number] DOB: 1941/02/10 DOA: 08/03/2014 PCP: Georgann Housekeeper, MD   Brief narrative 74 y.o. male with PMH of hypertension, hyperlipidemia, diastolic congestive heart failure, history of GI bleeding, alcohol abuse, who presented with rectal bleeding, altered mental status and abdominal pain. In ED, patient was found to have hemoglobin drop  to 7.2, lactate 1.7, and INR 1.42, FOBT positive, alcohol level less than 5. He was admitted to stepdown unit. GI was consulted and patient is status post EGD and variceal banding. Ongoing treatment for hepatic encephalopathy with improvement in mental status. He was transfused for acute blood loss anemia. Treated for ileus which has improved.   Assessment/Plan: GI bleeding/large esophageal varices status post banding 7 on 08/07/14 - Status post EGD with banding of large esophageal varices on 08/07/14. Subsequently had recurrent rectal bleeding along with further drop in H&H. Marland Kitchen Repeat EGD & flexible sigmoidoscopy was negative for active bleeding. - Large maroon stool on 6/22 but hemoglobin has remained stable. Still passing dark color stool was likely residual blood. - Continue IV Protonix -GI following.  Acute blood loss anemia - Status post PRBCs 5 since admission. - Hemoglobin stable at 7.8 today. -transfuse if hemoglobin less than 7.  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 Ultrasound abdomen with small amount of. Hepatic ascites. Patient actively drinking prior to hospitalization and counseled on cessation. Social work following.  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 - CT head without acute abnormalities. Mentation much improved and ammonia level trending down as well. Avoiding sedatives and narcotics. Avoid benzos. On seroquel per psych. -Continue lactulose and xifaxin . -Cultures  negative.  Ileus -  improving. No significant ascites on ultrasound. No nausea or vomiting to suggest obstructive process. - GI following. - Minimize narcotics. - Mobilize with PT. advance diet to low sodium.  Abdominal pain - Possibly Chronic and worsened by ileus. CT results reviewed. Unlikely SBP. Patient has already completed 5 days of IV Rocephin. IV narcotics discontinued given encephalopathy.    Chronic diastolic CHF - 2-D echo May 2015: EF 60-65 percent with grade 1 diastolic dysfunction. Appears mildly volume overloaded. Lasix and Aldactone on hold secondary to poor oral intake. Will resume.  Essential hypertension - Continue beta blockers. Controlled  Alcohol abuse -No signs of withdrawal at present.  Thrombocytopenia - Secondary to underlying cirrhosis. Currently stable. Monitor  Hypokalemia Replenished  Diet: Full liquid  DVT prophylaxis: SCDs  Code Status: Full Family Communication: Discussed with patient's daughter at bedside  Disposition Plan: Transfer to telemetry   Consultants:  [facility]  Psychiatry   HPI/Subjective: Patient seen and examined. Daughter at bedside. Appears more oriented today with episodes of confusion. Still passing dark stool.  Objective: Filed Vitals:   08/13/14 1300  BP: 145/64  Pulse: 110  Temp:   Resp: 21    Intake/Output Summary (Last 24 hours) at 08/13/14 1352 Last data filed at 08/13/14 0600  Gross per 24 hour  Intake     20 ml  Output    830 ml  Net   -810 ml   Filed Weights   08/03/14 0800 08/04/14 0400 08/10/14 0400  Weight: 93 kg (205 lb 0.4 oz) 94.9 kg (209 lb 3.5 oz) 93 kg (205 lb 0.4 oz)    Exam:   General:  Elderly male lying in bed in no acute distress  HEENT: Pallor present, no icterus, moist oral mucosa, supple neck  Chest: Diminished bibasilar  breath sounds  CVS: Normal S1 and S2, no murmurs rub or gallop  GI: Soft, minimally distended, nontender, bowel sounds  present  Musculoskeletal: Warm, no edema  CNS: Alert and oriented, showed a confusion, no tremors  Data Reviewed: Basic Metabolic Panel:  Recent Labs Lab 08/07/14 0400 08/08/14 0434 08/09/14 0630 08/10/14 0510 08/11/14 0545 08/13/14 0450  NA 140 135 134* 138 138 141  K 4.3 4.2 4.7 4.0 3.6 3.4*  CL 111 105 106 107 105 106  CO2 24 26 25 26 26 27   GLUCOSE 135* 141* 123* 107* 115* 129*  BUN 10 10 21* 24* 19 13  CREATININE 0.49* 0.56* 1.03 0.55* 0.58* 0.67  CALCIUM 7.9* 7.9* 7.5* 7.6* 7.4* 8.0*  MG 2.0 1.8 2.3 2.1  --   --    Liver Function Tests:  Recent Labs Lab 08/08/14 0434 08/09/14 0630 08/10/14 0510 08/11/14 0545 08/13/14 0450  AST 77* 74* 79* 80* 77*  ALT 38 37 40 38 37  ALKPHOS 83 70 81 82 99  BILITOT 2.7* 2.2* 6.0* 3.4* 2.5*  PROT 6.1* 5.3* 6.0* 6.0* 6.3*  ALBUMIN 2.5* 2.3* 2.6* 2.5* 2.7*   No results for input(s): LIPASE, AMYLASE in the last 168 hours.  Recent Labs Lab 08/07/14 0450 08/08/14 0434 08/09/14 0630 08/10/14 0610 08/11/14 0621  AMMONIA 54* 53* 65* 87* 65*   CBC:  Recent Labs Lab 08/09/14 2056 08/10/14 0510 08/10/14 7494 08/11/14 0545 08/12/14 0019 08/12/14 0417 08/13/14 0450  WBC 12.7* 13.7*  --  10.3  --  9.6 10.9*  NEUTROABS  --  8.1*  --  5.9  --   --   --   HGB 8.5* 8.5* 8.4* 7.9* 7.6* 7.4* 7.8*  HCT 26.0* 25.6*  --  23.9* 23.3* 23.4* 24.1*  MCV 91.9 92.8  --  94.1  --  94.7 95.3  PLT 158 155  --  141*  --  144* 153   Cardiac Enzymes: No results for input(s): CKTOTAL, CKMB, CKMBINDEX, TROPONINI in the last 168 hours. BNP (last 3 results)  Recent Labs  08/03/14 1730  BNP 35.6    ProBNP (last 3 results) No results for input(s): PROBNP in the last 8760 hours.  CBG:  Recent Labs Lab 08/10/14 0821 08/11/14 0824 08/12/14 0818 08/13/14 0721 08/13/14 1131  GLUCAP 99 103* 108* 134* 142*    Recent Results (from the past 240 hour(s))  Urine culture     Status: None   Collection Time: 08/04/14  1:08 PM  Result  Value Ref Range Status   Specimen Description URINE, CATHETERIZED  Final   Special Requests NONE  Final   Culture   Final    NO GROWTH 2 DAYS Performed at [facility]    Report Status 08/06/2014 FINAL  Final  Culture, blood (routine x 2)     Status: None (Preliminary result)   Collection Time: 08/10/14  9:30 AM  Result Value Ref Range Status   Specimen Description BLOOD LEFT ARM  Final   Special Requests BOTTLES DRAWN AEROBIC ONLY 3CC  Final   Culture   Final    NO GROWTH 2 DAYS Performed at [facility]    Report Status PENDING  Incomplete  Culture, blood (routine x 2)     Status: None (Preliminary result)   Collection Time: 08/10/14  9:35 AM  Result Value Ref Range Status   Specimen Description BLOOD LEFT HAND  Final   Special Requests BOTTLES DRAWN AEROBIC ONLY 1CC  Final  Culture   Final    NO GROWTH 2 DAYS Performed at [facility]    Report Status PENDING  Incomplete  Culture, Urine     Status: None   Collection Time: 08/10/14  2:04 PM  Result Value Ref Range Status   Specimen Description URINE, CATHETERIZED  Final   Special Requests NONE  Final   Culture   Final    NO GROWTH 2 DAYS Performed at [facility]    Report Status 08/12/2014 FINAL  Final     Studies: Dg Abd Portable 1v  08/13/2014   CLINICAL DATA:  Ileus.  EXAM: PORTABLE ABDOMEN - 1 VIEW  COMPARISON:  08/10/2014.  FINDINGS: Soft tissue structures are unremarkable. Continued improvement of small and large bowel distention. Findings consistent with improving adynamic ileus. No pathologic intra-abdominal calcifications noted. No acute bony abnormality.  IMPRESSION: Findings consistent with continued improvement of adynamic ileus.   Electronically Signed   By: Maisie Fus  Register   On: 08/13/2014 08:00    Scheduled Meds: . folic acid  1 mg Oral Daily  . lactulose  20 g Oral TID  . metoprolol  5 mg Intravenous 3 times per day  . multivitamin with minerals  1 tablet Oral Daily  .  pantoprazole (PROTONIX) IV  40 mg Intravenous Q12H  . QUEtiapine  25 mg Oral TID  . rifaximin  550 mg Oral BID  . sodium chloride  10-40 mL Intracatheter Q12H  . sodium chloride  3 mL Intravenous Q12H  . thiamine  100 mg Oral Daily   Or  . thiamine  100 mg Intravenous Daily   Continuous Infusions:     Time spent: 25 MINUTES    Eddie North  [facility] Pager [phone] If 7PM-7AM, please contact night-coverage at [email or web address], password [facility] 08/13/2014, 1:52 PM  LOS: 10 days

## 2014-08-13 NOTE — Progress Notes (Signed)
Physical Therapy Treatment Patient Details Name: Keith [name] MRN: [id number] DOB: 07/29/1940 Today's Date: 08/13/2014    History of Present Illness Keith [name] is a 74 y.o. male with PMH of hypertension, hyperlipidemia, diastolic congestive heart failure, history of GI bleeding, alcohol abuse, who presents with rectal bleeding, altered mental status and abdominal pain; He has SBO of unknown etiology; Pt has been unable to mobilize d/t sedation (d/t hx of ETOH), he can't have NGT d/t  esophageal varices pe rdiscussion wtih MD;     PT Comments    Progressing with mobility. Pt agreeable to ambulation on today. Weakens and fatigues easily with ambulation but tolerated distance well on today.   Follow Up Recommendations  Home health PT;Supervision/Assistance - 24 hour (family refuses SNF)     Equipment Recommendations  Rolling walker with 5" wheels    Recommendations for Other Services OT consult     Precautions / Restrictions Precautions Precautions: Fall Restrictions Weight Bearing Restrictions: No    Mobility  Bed Mobility Overal bed mobility: Needs Assistance Bed Mobility: Supine to Sit;Sit to Supine     Supine to sit: Min assist;+2 for physical assistance;+2 for safety/equipment;HOB elevated Sit to supine: Min assist;+2 for physical assistance;+2 for safety/equipment;HOB elevated   General bed mobility comments: Assist for trunk. Cueing for safety, technique. Increased time.   Transfers Overall transfer level: Needs assistance Equipment used: 2 person hand held assist Transfers: Sit to/from Stand Sit to Stand: Min assist;+2 physical assistance;+2 safety/equipment;From elevated surface         General transfer comment: Assist to rise, stabilize, control descent.   cueing for safety, technique.   Ambulation/Gait Ambulation/Gait assistance: Min assist;+2 physical assistance;+2 safety/equipment Ambulation Distance (Feet): 75 Feet Assistive device: 2  person hand held assist Gait Pattern/deviations: Staggering right;Staggering left;Scissoring;Decreased stride length;Step-through pattern     General Gait Details: Assist to stabilize/steady pt. Increased assistance needed as distance progressed and pt began to fatigue. Also noted increased staggering, scissoring as well as fatigue set in. VCS safety, posture, maintaining appropriate BOS.    Stairs            Wheelchair Mobility    Modified Rankin (Stroke Patients Only)       Balance           Standing balance support: During functional activity Standing balance-Leahy Scale: Fair                      Cognition Arousal/Alertness: Awake/alert Behavior During Therapy: WFL for tasks assessed/performed Overall Cognitive Status: Within Functional Limits for tasks assessed (for the most part. Pt has moments of confusion intermittently )                      Exercises      General Comments        Pertinent Vitals/Pain Pain Assessment: No/denies pain    Home Living                      Prior Function            PT Goals (current goals can now be found in the care plan section) Progress towards PT goals: Progressing toward goals    Frequency  Min 3X/week    PT Plan Current plan remains appropriate    Co-evaluation             End of Session Equipment Utilized During Treatment: Gait belt Activity Tolerance: Patient limited by  fatigue Patient left: in bed;with call bell/phone within reach;with family/visitor present;with nursing/sitter in room     Time: 4235-3614 PT Time Calculation (min) (ACUTE ONLY): 14 min  Charges:  $Gait Training: 8-22 mins                    G Codes:      Rebeca Alert, MPT Pager: [phone]

## 2014-08-14 LAB — COMPREHENSIVE METABOLIC PANEL
ALT: 36 U/L (ref 17–63)
ANION GAP: 8 (ref 5–15)
AST: 75 U/L — ABNORMAL HIGH (ref 15–41)
Albumin: 2.6 g/dL — ABNORMAL LOW (ref 3.5–5.0)
Alkaline Phosphatase: 96 U/L (ref 38–126)
BUN: 11 mg/dL (ref 6–20)
CHLORIDE: 104 mmol/L (ref 101–111)
CO2: 27 mmol/L (ref 22–32)
CREATININE: 0.61 mg/dL (ref 0.61–1.24)
Calcium: 8 mg/dL — ABNORMAL LOW (ref 8.9–10.3)
GFR calc Af Amer: >60 mL/min (ref >60)
GFR calc non Af Amer: >60 mL/min (ref >60)
Glucose, Bld: 111 mg/dL — ABNORMAL HIGH (ref 65–99)
Potassium: 3.7 mmol/L (ref 3.5–5.1)
Sodium: 139 mmol/L (ref 135–145)
Total Bilirubin: 2.3 mg/dL — ABNORMAL HIGH (ref 0.3–1.2)
Total Protein: 6.1 g/dL — ABNORMAL LOW (ref 6.5–8.1)

## 2014-08-14 LAB — CBC
HCT: 23.5 % — ABNORMAL LOW (ref 39.0–52.0)
Hemoglobin: 7.7 g/dL — ABNORMAL LOW (ref 13.0–17.0)
MCH: 31.4 pg (ref 26.0–34.0)
MCHC: 32.8 g/dL (ref 30.0–36.0)
MCV: 95.9 fL (ref 78.0–100.0)
Platelets: 160 10*3/uL (ref 150–400)
RBC: 2.45 MIL/uL — ABNORMAL LOW (ref 4.22–5.81)
RDW: 17.3 % — AB (ref 11.5–15.5)
WBC: 10.8 10*3/uL — ABNORMAL HIGH (ref 4.0–10.5)

## 2014-08-14 LAB — GLUCOSE, CAPILLARY: Glucose-Capillary: 91 mg/dL (ref 65–99)

## 2014-08-14 MED ORDER — PANTOPRAZOLE SODIUM 40 MG PO TBEC: 40.0000 mg | DELAYED_RELEASE_TABLET | Freq: Two times a day (BID) | ORAL | Status: DC

## 2014-08-14 MED ORDER — PANTOPRAZOLE SODIUM 40 MG PO TBEC
40.0000 mg | DELAYED_RELEASE_TABLET | Freq: Every day | ORAL | Status: DC
  Administered 2014-08-14 – 2014-08-16 (×3): 40 mg via ORAL
  Filled 2014-08-14 (×3): qty 1

## 2014-08-14 MED ORDER — FUROSEMIDE 20 MG PO TABS
20.0000 mg | ORAL_TABLET | Freq: Every day | ORAL | Status: DC
  Administered 2014-08-14 – 2014-08-16 (×3): 20 mg via ORAL
  Filled 2014-08-14 (×3): qty 1

## 2014-08-14 NOTE — Op Note (Signed)
[facility] [address] [place], [address]   FLEXIBLE SIGMOIDOSCOPY PROCEDURE REPORT  PATIENT: Keith [name], Keith [name]  MR#: [id number] BIRTHDATE: 1940-08-27 , 73  yrs. old GENDER: male ENDOSCOPIST: Iva Boop, MD, [facility] PROCEDURE DATE:  08/10/2014 PROCEDURE:   Sigmoidoscopy, diagnostic ASA CLASS:   Class III INDICATIONS:hematochezia. MEDICATIONS: Residual sedation present  DESCRIPTION OF PROCEDURE:   After the risks benefits and alternatives of the procedure were thoroughly explained, informed consent was obtained.  Digital exam revealed no abnormalities of the rectum. The     endoscope was introduced through the anus  and advanced to the sigmoid colon , The exam was Without limitations. The quality of the prep was The overall prep quality was fair. . Estimated blood loss is zero unless otherwise noted in this procedure report. The instrument was then slowly withdrawn as the mucosa was fully examined.         COLON FINDINGS: 1) Some red blood seen 2) large rectal varices noted 3) Limited views o/w normal.    retroflex as above         The scope was then withdrawn from the patient and the procedure terminated.  COMPLICATIONS: There were no immediate complications.  ENDOSCOPIC IMPRESSION: 1) Some red blood seen 2) large rectal varices noted 3) Limited views o/w normal  RECOMMENDATIONS: Supportive care if persistent bleeding consider TIPS    eSigned:  Iva Boop, MD, [facility] 08/14/2014 12:08 PM

## 2014-08-14 NOTE — Op Note (Signed)
[facility] [address] [place], [address]   ENDOSCOPY PROCEDURE REPORT  PATIENT: Keith [name], Keith [name]  MR#: [id number] BIRTHDATE: January 24, 1941 , 73  yrs. old GENDER: male ENDOSCOPIST: Iva Boop, MD, [facility] PROCEDURE DATE:  08/10/2014 PROCEDURE:  EGD, diagnostic ASA CLASS:     Class III INDICATIONS:  melena. MEDICATIONS: Fentantyl and Versed IV - see RN documentation TOPICAL ANESTHETIC: Cetacaine Spray  DESCRIPTION OF PROCEDURE: After the risks benefits and alternatives of the procedure were thoroughly explained, informed consent was obtained.  The PENTAX GASTOROSCOPE [id number] endoscope was introduced through the mouth and advanced to the second portion of the duodenum , Without limitations.  The instrument was slowly withdrawn as the mucosa was fully examined.    Appropriate ulceration and changes post-banidng of esophageal varices - not bleeding duodenal inflammation/stricture portal gastropathy - mild normal otherwise.  Retroflexed views revealed as previously described.     The scope was then withdrawn from the patient and the procedure completed.  COMPLICATIONS: There were no immediate complications.  ENDOSCOPIC IMPRESSION: Appropriate ulceration and changes post-banidng of esophageal varices - not bleeding duodenal inflammation/stricture portal gastropathy - mild normal otherwise  RECOMMENDATIONS: Continue current care flex sig next   eSigned:  Iva Boop, MD, [facility] 08/14/2014 12:04 PM

## 2014-08-14 NOTE — Progress Notes (Signed)
[facility] PROGRESS NOTE  Keith [name] ZOX:[id number] DOB: [birth year] DOA: 08/03/2014 PCP: Georgann Housekeeper, MD   Brief narrative 74 y.o. male with PMH of hypertension, hyperlipidemia, diastolic congestive heart failure, history of GI bleeding, alcohol abuse, who presented with rectal bleeding, altered mental status and abdominal pain. In ED, patient was found to have hemoglobin drop  to 7.2, lactate 1.7, and INR 1.42, FOBT positive, alcohol level less than 5. He was admitted to stepdown unit. GI was consulted and patient is status post EGD and variceal banding. Ongoing treatment for hepatic encephalopathy with improvement in mental status. He was transfused for acute blood loss anemia. Treated for ileus which has improved.   Assessment/Plan: GI bleeding/large esophageal varices status post banding 7 on 08/07/14 - Status post EGD with banding of large esophageal varices on 08/07/14. Subsequently had recurrent rectal bleeding along with further drop in H&H. Marland Kitchen Repeat EGD & flexible sigmoidoscopy was negative for active bleeding. - Large maroon stool on 6/22 but hemoglobin has remained stable. Still passing dark color stool was likely residual blood. - change to po PPI.  - appreciate GI recommendations. Plan for GI follow up as outpatient .  Acute blood loss anemia - Status post PRBCs 5 since admission. - Hemoglobin stable at 7.7 today. -transfuse if hemoglobin less than 7.  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 Ultrasound abdomen with small amount of. Hepatic ascites. Patient actively drinking prior to hospitalization and counseled on cessation. Social work following.  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 - CT head without acute abnormalities. Mentation much improved and ammonia level trending down as well. Avoiding sedatives and narcotics. Avoid benzos. On seroquel per  psych. -Continue lactulose and xifaxin . -Cultures negative.  Ileus -  improving. No significant ascites on ultrasound. No nausea or vomiting to suggest obstructive process. - GI following. - Minimize narcotics. - Mobilize with PT. advance diet to low sodium.  Abdominal pain - Possibly Chronic and worsened by ileus. CT results reviewed. Unlikely SBP. Patient has already completed 5 days of IV Rocephin. IV narcotics discontinued given encephalopathy.    Chronic diastolic CHF - 2-D echo May 2015: EF 60-65 percent with grade 1 diastolic dysfunction. Appears mildly volume overloaded. Lasix resumed, will add on spironolactone in am.   Essential hypertension - Continue beta blockers. Controlled  Alcohol abuse -No signs of withdrawal at present.  Thrombocytopenia - Secondary to underlying cirrhosis. Currently stable. Monitor  Hypokalemia Replenished  Diet: Full liquid  DVT prophylaxis: SCDs  Code Status: Full Family Communication: Discussed with patient's daughter at bedside  Disposition Plan: pending PT eval .   Consultants:  [facility]  Psychiatry   HPI/Subjective: Patient seen and examined. Daughter at bedside. Reports feelign better today.  Objective: Filed Vitals:   08/14/14 1507  BP: 135/72  Pulse: 93  Temp: 98.3 F (36.8 C)  Resp: 22    Intake/Output Summary (Last 24 hours) at 08/14/14 1534 Last data filed at 08/14/14 1500  Gross per 24 hour  Intake    240 ml  Output    850 ml  Net   -610 ml   Filed Weights   08/04/14 0400 08/10/14 0400 08/13/14 2042  Weight: 94.9 kg (209 lb 3.5 oz) 93 kg (205 lb 0.4 oz) 96.026 kg (211 lb 11.2 oz)    Exam:   General:  Elderly male lying in bed in no acute distress  HEENT: Pallor present, no icterus, moist oral mucosa, supple neck  Chest: Diminished bibasilar breath sounds  CVS: Normal S1 and S2, no murmurs rub or gallop  GI: Soft, minimally distended, nontender, bowel sounds present  Musculoskeletal:  Warm, no edema  CNS: Alert and oriented, showed a confusion, no tremors  Data Reviewed: Basic Metabolic Panel:  Recent Labs Lab 08/08/14 0434 08/09/14 0630 08/10/14 0510 08/11/14 0545 08/13/14 0450 08/14/14 0450  NA 135 134* 138 138 141 139  K 4.2 4.7 4.0 3.6 3.4* 3.7  CL 105 106 107 105 106 104  CO2 26 25 26 26 27 27   GLUCOSE 141* 123* 107* 115* 129* 111*  BUN 10 21* 24* 19 13 11   CREATININE 0.56* 1.03 0.55* 0.58* 0.67 0.61  CALCIUM 7.9* 7.5* 7.6* 7.4* 8.0* 8.0*  MG 1.8 2.3 2.1  --   --   --    Liver Function Tests:  Recent Labs Lab 08/09/14 0630 08/10/14 0510 08/11/14 0545 08/13/14 0450 08/14/14 0450  AST 74* 79* 80* 77* 75*  ALT 37 40 38 37 36  ALKPHOS 70 81 82 99 96  BILITOT 2.2* 6.0* 3.4* 2.5* 2.3*  PROT 5.3* 6.0* 6.0* 6.3* 6.1*  ALBUMIN 2.3* 2.6* 2.5* 2.7* 2.6*   No results for input(s): LIPASE, AMYLASE in the last 168 hours.  Recent Labs Lab 08/08/14 0434 08/09/14 0630 08/10/14 0610 08/11/14 0621  AMMONIA 53* 65* 87* 65*   CBC:  Recent Labs Lab 08/10/14 0510  08/11/14 0545 08/12/14 0019 08/12/14 0417 08/13/14 0450 08/14/14 0450  WBC 13.7*  --  10.3  --  9.6 10.9* 10.8*  NEUTROABS 8.1*  --  5.9  --   --   --   --   HGB 8.5*  < > 7.9* 7.6* 7.4* 7.8* 7.7*  HCT 25.6*  --  23.9* 23.3* 23.4* 24.1* 23.5*  MCV 92.8  --  94.1  --  94.7 95.3 95.9  PLT 155  --  141*  --  144* 153 160  < > = values in this interval not displayed. Cardiac Enzymes: No results for input(s): CKTOTAL, CKMB, CKMBINDEX, TROPONINI in the last 168 hours. BNP (last 3 results)  Recent Labs  08/03/14 1730  BNP 35.6    ProBNP (last 3 results) No results for input(s): PROBNP in the last 8760 hours.  CBG:  Recent Labs Lab 08/11/14 0824 08/12/14 0818 08/13/14 0721 08/13/14 1131 08/14/14 0726  GLUCAP 103* 108* 134* 142* 91    Recent Results (from the past 240 hour(s))  Culture, blood (routine x 2)     Status: None (Preliminary result)   Collection Time:  08/10/14  9:30 AM  Result Value Ref Range Status   Specimen Description BLOOD LEFT ARM  Final   Special Requests BOTTLES DRAWN AEROBIC ONLY 3CC  Final   Culture   Final    NO GROWTH 4 DAYS Performed at [facility]    Report Status PENDING  Incomplete  Culture, blood (routine x 2)     Status: None (Preliminary result)   Collection Time: 08/10/14  9:35 AM  Result Value Ref Range Status   Specimen Description BLOOD LEFT HAND  Final   Special Requests BOTTLES DRAWN AEROBIC ONLY 1CC  Final   Culture   Final    NO GROWTH 4 DAYS Performed at [facility]    Report Status PENDING  Incomplete  Culture, Urine     Status: None   Collection Time: 08/10/14  2:04 PM  Result Value Ref Range Status   Specimen Description URINE, CATHETERIZED  Final   Special Requests NONE  Final   Culture   Final    NO GROWTH 2 DAYS Performed at [facility]    Report Status 08/12/2014 FINAL  Final     Studies: Dg Abd Portable 1v  08/13/2014   CLINICAL DATA:  Ileus.  EXAM: PORTABLE ABDOMEN - 1 VIEW  COMPARISON:  08/10/2014.  FINDINGS: Soft tissue structures are unremarkable. Continued improvement of small and large bowel distention. Findings consistent with improving adynamic ileus. No pathologic intra-abdominal calcifications noted. No acute bony abnormality.  IMPRESSION: Findings consistent with continued improvement of adynamic ileus.   Electronically Signed   By: Maisie Fus  Register   On: 08/13/2014 08:00    Scheduled Meds: . folic acid  1 mg Oral Daily  . furosemide  20 mg Oral Daily  . lactulose  20 g Oral TID  . metoprolol  5 mg Intravenous 3 times per day  . multivitamin with minerals  1 tablet Oral Daily  . pantoprazole  40 mg Oral Daily  . QUEtiapine  25 mg Oral BID  . rifaximin  550 mg Oral BID  . sodium chloride  10-40 mL Intracatheter Q12H  . sodium chloride  3 mL Intravenous Q12H  . thiamine  100 mg Oral Daily   Or  . thiamine  100 mg Intravenous Daily   Continuous  Infusions:     Time spent: 25 MINUTES    Kharee Lesesne  [facility] Pager [phone] If 7PM-7AM, please contact night-coverage at [email or web address], password [facility] 08/14/2014, 3:34 PM  LOS: 11 days

## 2014-08-14 NOTE — Progress Notes (Signed)
    Progress Note   Subjective  sitting up in bed, talkative. Feels fine. Ambulated yesterday. Eating.    Objective   Vital signs in last 24 hours: Temp:  [98.5 F (36.9 C)-99.6 F (37.6 C)] 98.5 F (36.9 C) (06/25 0503) Pulse Rate:  [97-113] 101 (06/25 0503) Resp:  [18-25] 21 (06/25 0503) BP: (104-170)/(52-88) 149/80 mmHg (06/25 0503) SpO2:  [94 %-98 %] 98 % (06/25 0503) Weight:  [211 lb 11.2 oz (96.026 kg)] 211 lb 11.2 oz (96.026 kg) (06/24 2042) Last BM Date: 08/13/14 General:    Pleasant black male in NAD Abdomen:  Soft, nontender still largely distended with tympany. . Extremities:  Without edema. Neurologic:  Alert and oriented,  grossly normal neurologically. Psych:  Cooperative. Normal mood and affect.  Lab Results:  Recent Labs  08/12/14 0417 08/13/14 0450 08/14/14 0450  WBC 9.6 10.9* 10.8*  HGB 7.4* 7.8* 7.7*  HCT 23.4* 24.1* 23.5*  PLT 144* 153 160   BMET  Recent Labs  08/13/14 0450 08/14/14 0450  NA 141 139  K 3.4* 3.7  CL 106 104  CO2 27 27  GLUCOSE 129* 111*  BUN 13 11  CREATININE 0.67 0.61  CALCIUM 8.0* 8.0*   LFT  Recent Labs  08/14/14 0450  PROT 6.1*  ALBUMIN 2.6*  AST 75*  ALT 36  ALKPHOS 96  BILITOT 2.3*     Assessment / Plan:   1. 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 Encephalopathy significantly improved. Continue TID lactulose (averaging about two BMs / day). Continue Xifaxan.Will change PPI to PO today. Our office will arrange for repeat EGD with banding in 2-4 weeks (I sent not to primary GI Dr. Christella Hartigan).   2. Ileus. Abdominal films yesterday showed continued improvement. Clinically doing well ( having BMs and no nausea)   3. Anemia of acute blood loss, improved post transfusion    LOS: 11 days   Willette Cluster  08/14/2014, 9:51 AM   [facility] Attending  I have also seen and assessed the patient and agree with the advanced practitioner's assessment and plan. My Hx  and PE same.  We are signing off. Will make f/u arrangements.   Iva Boop, MD, [facility] [phone] (pager) 08/14/2014 11:32 AM

## 2014-08-15 LAB — GLUCOSE, CAPILLARY
GLUCOSE-CAPILLARY: 108 mg/dL — AB (ref 65–99)
Glucose-Capillary: 114 mg/dL — ABNORMAL HIGH (ref 65–99)

## 2014-08-15 LAB — CULTURE, BLOOD (ROUTINE X 2)
CULTURE: NO GROWTH
Culture: NO GROWTH

## 2014-08-15 MED ORDER — SPIRONOLACTONE 25 MG PO TABS
12.5000 mg | ORAL_TABLET | Freq: Every day | ORAL | Status: DC
  Administered 2014-08-15 – 2014-08-16 (×2): 12.5 mg via ORAL
  Filled 2014-08-15 (×2): qty 1

## 2014-08-15 NOTE — Progress Notes (Signed)
[facility] PROGRESS NOTE  Keith [name] ZOX:[id number] DOB: [birth year] DOA: 08/03/2014 PCP: Keith Housekeeper, MD   Brief narrative 74 y.o. male with PMH of hypertension, hyperlipidemia, diastolic congestive heart failure, history of GI bleeding, alcohol abuse, who presented with rectal bleeding, altered mental status and abdominal pain. In ED, patient was found to have hemoglobin drop  to 7.2, lactate 1.7, and INR 1.42, FOBT positive, alcohol level less than 5. He was admitted to stepdown unit. GI was consulted and patient is status post EGD and variceal banding. Ongoing treatment for hepatic encephalopathy with improvement in mental status. He was transfused for acute blood loss anemia. Treated for ileus which has improved.   Assessment/Plan: GI bleeding/large esophageal varices status post banding 7 on 08/07/14 - Status post EGD with banding of large esophageal varices on 08/07/14. Subsequently had recurrent rectal bleeding along with further drop in H&H. Marland Kitchen Repeat EGD & flexible sigmoidoscopy was negative for active bleeding. - Large maroon stool on 6/22 but hemoglobin has remained stable. Still passing dark color stool was likely residual blood. - change to po PPI.  - appreciate GI recommendations. Plan for GI follow up as outpatient . Check cbc in am.   Acute blood loss anemia - Status post PRBCs 5 since admission. - Hemoglobin stable at 7.7 repeat CBC in am.  -transfuse if hemoglobin less than 7.  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 Ultrasound abdomen with small amount of. Hepatic ascites. Patient actively drinking prior to hospitalization and counseled on cessation. Social work following.  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 - CT head without acute abnormalities. Mentation much improved and ammonia level trending down as well. Avoiding sedatives and narcotics. Avoid  benzos. On seroquel per psych. -Continue lactulose and xifaxin . -Cultures negative.  Ileus -  improving. No significant ascites on ultrasound. No nausea or vomiting to suggest obstructive process. - GI following. - Minimize narcotics. - Mobilize with PT. advance diet to low sodium.  Abdominal pain - Possibly Chronic and worsened by ileus. CT results reviewed. Unlikely SBP. Patient has already completed 5 days of IV Rocephin. IV narcotics discontinued given encephalopathy.    Chronic diastolic CHF - 2-D echo May 2015: EF 60-65 percent with grade 1 diastolic dysfunction. Appears mildly volume overloaded. Lasix resumed, added on spironolactone today.   Essential hypertension - Continue beta blockers. Controlled  Alcohol abuse -No signs of withdrawal at present.  Thrombocytopenia - Secondary to underlying cirrhosis. Currently stable. Monitor  Hypokalemia Replenished    DVT prophylaxis: SCDs  Code Status: Full Family Communication: none at bedside Disposition Plan: pending PT eval .possibly home tomorrow.    Consultants:  [facility]  Psychiatry   HPI/Subjective: Patient seen and examined.none at bedside. No new complaints. Weaned him off oxygen.  Objective: Filed Vitals:   08/15/14 1436  BP: 130/52  Pulse: 91  Temp: 98.2 F (36.8 C)  Resp: 18    Intake/Output Summary (Last 24 hours) at 08/15/14 1655 Last data filed at 08/15/14 1300  Gross per 24 hour  Intake    840 ml  Output    430 ml  Net    410 ml   Filed Weights   08/10/14 0400 08/13/14 2042 08/15/14 0414  Weight: 93 kg (205 lb 0.4 oz) 96.026 kg (211 lb 11.2 oz) 98.113 kg (216 lb 4.8 oz)    Exam:   General:  Elderly male lying in bed in no acute distress  HEENT: Pallor present,  no icterus, moist oral mucosa, supple neck  Chest: Diminished bibasilar breath sounds  CVS: Normal S1 and S2, no murmurs rub or gallop  GI: Soft, minimally distended, nontender, bowel sounds  present  Musculoskeletal: Warm, no edema  CNS: Alert and oriented, showed a confusion, no tremors  Data Reviewed: Basic Metabolic Panel:  Recent Labs Lab 08/09/14 0630 08/10/14 0510 08/11/14 0545 08/13/14 0450 08/14/14 0450  NA 134* 138 138 141 139  K 4.7 4.0 3.6 3.4* 3.7  CL 106 107 105 106 104  CO2 25 26 26 27 27   GLUCOSE 123* 107* 115* 129* 111*  BUN 21* 24* 19 13 11   CREATININE 1.03 0.55* 0.58* 0.67 0.61  CALCIUM 7.5* 7.6* 7.4* 8.0* 8.0*  MG 2.3 2.1  --   --   --    Liver Function Tests:  Recent Labs Lab 08/09/14 0630 08/10/14 0510 08/11/14 0545 08/13/14 0450 08/14/14 0450  AST 74* 79* 80* 77* 75*  ALT 37 40 38 37 36  ALKPHOS 70 81 82 99 96  BILITOT 2.2* 6.0* 3.4* 2.5* 2.3*  PROT 5.3* 6.0* 6.0* 6.3* 6.1*  ALBUMIN 2.3* 2.6* 2.5* 2.7* 2.6*   No results for input(s): LIPASE, AMYLASE in the last 168 hours.  Recent Labs Lab 08/09/14 0630 08/10/14 0610 08/11/14 0621  AMMONIA 65* 87* 65*   CBC:  Recent Labs Lab 08/10/14 0510  08/11/14 0545 08/12/14 0019 08/12/14 0417 08/13/14 0450 08/14/14 0450  WBC 13.7*  --  10.3  --  9.6 10.9* 10.8*  NEUTROABS 8.1*  --  5.9  --   --   --   --   HGB 8.5*  < > 7.9* 7.6* 7.4* 7.8* 7.7*  HCT 25.6*  --  23.9* 23.3* 23.4* 24.1* 23.5*  MCV 92.8  --  94.1  --  94.7 95.3 95.9  PLT 155  --  141*  --  144* 153 160  < > = values in this interval not displayed. Cardiac Enzymes: No results for input(s): CKTOTAL, CKMB, CKMBINDEX, TROPONINI in the last 168 hours. BNP (last 3 results)  Recent Labs  08/03/14 1730  BNP 35.6    ProBNP (last 3 results) No results for input(s): PROBNP in the last 8760 hours.  CBG:  Recent Labs Lab 08/13/14 0721 08/13/14 1131 08/14/14 0726 08/15/14 0018 08/15/14 0722  GLUCAP 134* 142* 91 114* 108*    Recent Results (from the past 240 hour(s))  Culture, blood (routine x 2)     Status: None   Collection Time: 08/10/14  9:30 AM  Result Value Ref Range Status   Specimen  Description BLOOD LEFT ARM  Final   Special Requests BOTTLES DRAWN AEROBIC ONLY 3CC  Final   Culture   Final    NO GROWTH 5 DAYS Performed at [facility]    Report Status 08/15/2014 FINAL  Final  Culture, blood (routine x 2)     Status: None   Collection Time: 08/10/14  9:35 AM  Result Value Ref Range Status   Specimen Description BLOOD LEFT HAND  Final   Special Requests BOTTLES DRAWN AEROBIC ONLY 1CC  Final   Culture   Final    NO GROWTH 5 DAYS Performed at [facility]    Report Status 08/15/2014 FINAL  Final  Culture, Urine     Status: None   Collection Time: 08/10/14  2:04 PM  Result Value Ref Range Status   Specimen Description URINE, CATHETERIZED  Final   Special Requests NONE  Final  Culture   Final    NO GROWTH 2 DAYS Performed at [facility]    Report Status 08/12/2014 FINAL  Final     Studies: No results found.  Scheduled Meds: . folic acid  1 mg Oral Daily  . furosemide  20 mg Oral Daily  . lactulose  20 g Oral TID  . metoprolol  5 mg Intravenous 3 times per day  . multivitamin with minerals  1 tablet Oral Daily  . pantoprazole  40 mg Oral Daily  . QUEtiapine  25 mg Oral BID  . rifaximin  550 mg Oral BID  . sodium chloride  10-40 mL Intracatheter Q12H  . sodium chloride  3 mL Intravenous Q12H  . thiamine  100 mg Oral Daily   Or  . thiamine  100 mg Intravenous Daily   Continuous Infusions:     Time spent: 25 MINUTES    Keith [name]  [facility] Pager [phone] If 7PM-7AM, please contact night-coverage at [email or web address], password [facility] 08/15/2014, 4:55 PM  LOS: 12 days

## 2014-08-16 ENCOUNTER — Telehealth: Payer: Self-pay

## 2014-08-16 ENCOUNTER — Other Ambulatory Visit: Payer: Self-pay

## 2014-08-16 LAB — BASIC METABOLIC PANEL
Anion gap: 9 (ref 5–15)
BUN: 8 mg/dL (ref 6–20)
CALCIUM: 7.8 mg/dL — AB (ref 8.9–10.3)
CO2: 26 mmol/L (ref 22–32)
CREATININE: 0.59 mg/dL — AB (ref 0.61–1.24)
Chloride: 101 mmol/L (ref 101–111)
GFR calc Af Amer: >60 mL/min (ref >60)
GFR calc non Af Amer: >60 mL/min (ref >60)
Glucose, Bld: 119 mg/dL — ABNORMAL HIGH (ref 65–99)
Potassium: 3.3 mmol/L — ABNORMAL LOW (ref 3.5–5.1)
Sodium: 136 mmol/L (ref 135–145)

## 2014-08-16 LAB — CBC
HEMATOCRIT: 21.9 % — AB (ref 39.0–52.0)
HEMOGLOBIN: 7.2 g/dL — AB (ref 13.0–17.0)
MCH: 31 pg (ref 26.0–34.0)
MCHC: 32.9 g/dL (ref 30.0–36.0)
MCV: 94.4 fL (ref 78.0–100.0)
Platelets: 175 10*3/uL (ref 150–400)
RBC: 2.32 MIL/uL — ABNORMAL LOW (ref 4.22–5.81)
RDW: 16.9 % — ABNORMAL HIGH (ref 11.5–15.5)
WBC: 10.6 10*3/uL — ABNORMAL HIGH (ref 4.0–10.5)

## 2014-08-16 LAB — PREPARE RBC (CROSSMATCH)

## 2014-08-16 LAB — GLUCOSE, CAPILLARY: Glucose-Capillary: 106 mg/dL — ABNORMAL HIGH (ref 65–99)

## 2014-08-16 MED ORDER — POTASSIUM CHLORIDE CRYS ER 20 MEQ PO TBCR
40.0000 meq | EXTENDED_RELEASE_TABLET | Freq: Once | ORAL | Status: AC
  Administered 2014-08-16: 40 meq via ORAL
  Filled 2014-08-16: qty 2

## 2014-08-16 MED ORDER — QUETIAPINE FUMARATE 25 MG PO TABS: 25.0000 mg | ORAL_TABLET | Freq: Two times a day (BID) | ORAL | Status: DC

## 2014-08-16 MED ORDER — FOLIC ACID 1 MG PO TABS: 1.0000 mg | ORAL_TABLET | Freq: Every day | ORAL | Status: AC

## 2014-08-16 MED ORDER — PANTOPRAZOLE SODIUM 40 MG PO TBEC: 40.0000 mg | DELAYED_RELEASE_TABLET | Freq: Every day | ORAL | Status: AC

## 2014-08-16 MED ORDER — RIFAXIMIN 550 MG PO TABS: 550.0000 mg | ORAL_TABLET | Freq: Two times a day (BID) | ORAL | Status: AC

## 2014-08-16 MED ORDER — LACTULOSE 10 GM/15ML PO SOLN: 20.0000 g | Freq: Three times a day (TID) | ORAL | Status: DC

## 2014-08-16 MED ORDER — SODIUM CHLORIDE 0.9 % IV SOLN: Freq: Once | INTRAVENOUS | Status: DC

## 2014-08-16 NOTE — Telephone Encounter (Signed)
-----   Message from Rachael Fee, MD sent at 08/15/2014  7:54 AM EDT ----- i think a quick recheck is a great idea.  Thanks  Banding can be in 4 weeks, [facility], [facility] during a Thursday  Thanks  ----- Message -----    From: Meredith Pel, NP    Sent: 08/14/2014   9:53 AM      To: Rachael Fee, MD, Donata Duff, CMA  Good morning,  Patient will need egd with banding in 2-4 weeks. I will get him in with me or another extender before the procedure if you want a quick recheck prior to procedure. I will forward this to Samone Guhl.   Thanks PG

## 2014-08-16 NOTE — Care Management (Signed)
IM LETTER GIVEN TO PATIENT  

## 2014-08-16 NOTE — Progress Notes (Signed)
Clinical Social Work  CSW met with patient at bedside to discuss SA options. Patient alert and oriented and participate in assessment. Patient reports he is feeling better and ready to DC home. Patient reports that he and dtr got into an argument yesterday because he is not ready to fully commit to SA treatment. Patient reports he agreed to go to [employer] but that dtr wanted him to do more. Patient reports he was scared by hospitalization and wants to get sober but is not sure if he is 100% motivated. CSW encouraged intensive outpatient (IOP) but patient reports that he is not interested because he does not like group settings. CSW and patient spoke about individual outpatient therapy but patient is not agreeable to commit to scheduling any appointments. Patient thanked CSW for information but reports his main concern is DC home. Patient reports his dtr is agreeable to help and he has friends. Patient reports he will hire help as well if he is feeling weak.  Patient reports that he is agreeable for CSW to contact dtr. CSW spoke with dtr via phone who reports she is upset with patient for not agreeing to SA treatment. Dtr reports she is unsure if she can continue to live with patient due to self destructive behavior but is agreeable to arrange care. CSW asked CM to give dtr 24/7 caregiver list. Dtr reports several questions about medical treatment and concerns about DC. CSW text paged MD requesting she call dtr.  Dtr understanding that patient cannot be forced to treatment and that he will have to be the one to decide on treatment.  [place], [place] [phone]

## 2014-08-16 NOTE — Progress Notes (Signed)
Spoke with French Anaracy by phone, pt's daughter concerning Private Duty. [facility]

## 2014-08-17 LAB — TYPE AND SCREEN
ABO/RH(D): O POS
Antibody Screen: NEGATIVE
Unit division: 0

## 2014-08-17 NOTE — Progress Notes (Signed)
[facility]  [facility] the [facility]: SN/PT/OT/HHA/SW  If patient discharges after hours, please call [phone].   Renard HamperLecretia Williamson 08/17/2014, 10:53 AM

## 2014-08-17 NOTE — Telephone Encounter (Signed)
Pt has been scheduled for office visit with Willette ClusterPaula Guenther on 09/06/14 130 pm to discuss EGD.  Per Dr Christella HartiganJacobs can be scheduled on a Thursday with [facility] 09/16/14 is the next available.

## 2014-08-17 NOTE — Telephone Encounter (Signed)
Message left with family member to return call to set up return appt

## 2014-08-26 NOTE — Discharge Summary (Signed)
Physician Discharge Summary  Keith [name]:[id number] DOB: Nov 11, 1940 DOA: 08/03/2014  PCP: Georgann Housekeeper, MD  Admit date: 08/03/2014 Discharge date: 08/26/2014  Time spent: 30 minutes  Recommendations for Outpatient Follow-up:  1. Follow up with PCP in 2 weeks.  2. Follow up with GI as recommended.   Discharge Diagnoses:  Principal Problem:   Alcohol abuse Active Problems:   Hypertension   Hyperlipidemia   Cardiac arrest   Acute encephalopathy   Rectal bleeding   Chronic diastolic CHF (congestive heart failure)   GIB (gastrointestinal bleeding)   Abdominal pain   GI bleed   Encephalopathy, hepatic   Hepatic cirrhosis   Acute blood loss anemia   Adynamic ileus   Hypernatremia   Leukocytosis   Esophageal varices in alcoholic cirrhosis   SBO (small bowel obstruction)   Esophageal varices with bleeding   Cirrhosis   Ileus   Rectal varices   Stricture, duodenum   Discharge Condition: improved.   Diet recommendation: low sodium diet  Filed Weights   08/13/14 2042 08/15/14 0414 08/16/14 0428  Weight: 96.026 kg (211 lb 11.2 oz) 98.113 kg (216 lb 4.8 oz) 97.8 kg (215 lb 9.8 oz)    History of present illness: 74 y.o. male with PMH of hypertension, hyperlipidemia, diastolic congestive heart failure, history of GI bleeding, alcohol abuse, who presented with rectal bleeding, altered mental status and abdominal pain. In ED, patient was found to have hemoglobin drop to 7.2, lactate 1.7, and INR 1.42, FOBT positive, alcohol level less than 5. He was admitted to stepdown unit. GI was consulted and patient is status post EGD and variceal banding. Ongoing treatment for hepatic encephalopathy with improvement in mental status. He was transfused for acute blood loss anemia.  Hospital Course:  GI bleeding/large esophageal varices status post banding 7 on 08/07/14 - Status post EGD with banding of large esophageal varices on 08/07/14. Subsequently had recurrent rectal bleeding  along with further drop in H&H. Marland Kitchen Repeat EGD & flexible sigmoidoscopy was negative for active bleeding. - Large maroon stool on 6/22 but hemoglobin has remained stable. Still passing dark color stool was likely residual blood. - changed to po PPI.  - appreciate GI recommendations. Plan for GI follow up as outpatient .   Acute blood loss anemia - Status post PRBCs 6 since admission. - Hemoglobin stable at 7.7   Decompensated Alcoholic cirrhosis With associated esophageal variceal bleeding. Ultrasound abdomen with small amount of. Hepatic ascites. Patient actively drinking prior to hospitalization and counseled on cessation.   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 - CT head without acute abnormalities. Mentation much improved and ammonia level trending down as well. Avoiding sedatives and narcotics. Avoid benzos. On seroquel per psych. -Continue lactulose and xifaxin . -Cultures negative.  Ileus - improved. No significant ascites on ultrasound. No nausea or vomiting to suggest obstructive process. - GI following. - Minimize narcotics. - Mobilize with PT. advance diet to low sodium.   Procedures:  EGD  Consultations:  GI  Discharge Exam: Filed Vitals:   08/16/14 1702  BP: 154/64  Pulse: 94  Temp: 98.4 F (36.9 C)  Resp: 16    General: alert afebrile Cardiovascular: s1s2 Respiratory: ctab  Discharge Instructions   Discharge Instructions    Diet - low sodium heart healthy    Complete by:  As directed      Discharge instructions    Complete by:  As directed   Follow up with gastroenterology as recommended.  Discharge Medication List as of 08/16/2014  3:59 PM    START taking these medications   Details  folic acid (FOLVITE) 1 MG tablet Take 1 tablet (1 mg total) by mouth daily., Starting 08/16/2014, Until Discontinued, No Print    lactulose (CHRONULAC) 10 GM/15ML solution Take 30 mLs (20 g  total) by mouth 3 (three) times daily., Starting 08/16/2014, Until Discontinued, Print    pantoprazole (PROTONIX) 40 MG tablet Take 1 tablet (40 mg total) by mouth daily., Starting 08/16/2014, Until Discontinued, Print    QUEtiapine (SEROQUEL) 25 MG tablet Take 1 tablet (25 mg total) by mouth 2 (two) times daily., Starting 08/16/2014, Until Discontinued, Print    rifaximin (XIFAXAN) 550 MG TABS tablet Take 1 tablet (550 mg total) by mouth 2 (two) times daily., Starting 08/16/2014, Until Discontinued, Print      CONTINUE these medications which have NOT CHANGED   Details  albuterol (PROVENTIL HFA;VENTOLIN HFA) 108 (90 BASE) MCG/ACT inhaler Inhale 2 puffs into the lungs every 6 (six) hours as needed for wheezing., Starting 06/20/2012, Until Discontinued, Print    carvedilol (COREG) 25 MG tablet TAKE 1 TABLET BY MOUTH TWICE A DAY, Normal    EPINEPHrine (EPI-PEN) 0.3 mg/0.3 mL DEVI Inject 0.3 mg into the muscle as needed (for allergic reaction). , Starting 06/03/2012, Until Discontinued, Historical Med    fluticasone (FLONASE) 50 MCG/ACT nasal spray Place 1 spray into both nostrils 2 (two) times daily as needed. For nasal congestion., Starting 07/21/2014, Until Discontinued, Historical Med    furosemide (LASIX) 20 MG tablet Take 20 mg by mouth daily., Starting 07/27/2014, Until Discontinued, Historical Med    ipratropium (ATROVENT) 0.06 % nasal spray Place 2 sprays into the nose 4 (four) times daily as needed (for nasal congestion). , Until Discontinued, Historical Med    mometasone (NASONEX) 50 MCG/ACT nasal spray Place 2 sprays into the nose daily as needed (nasal congestion). , Until Discontinued, Historical Med    spironolactone (ALDACTONE) 25 MG tablet Take 25 mg by mouth daily., Starting 07/27/2014, Until Discontinued, Historical Med    thiamine 100 MG tablet Take 1 tablet (100 mg total) by mouth daily., Starting 05/31/2012, Until Discontinued, No Print      STOP taking these medications      atorvastatin (LIPITOR) 40 MG tablet        Allergies  Allergen Reactions  . Lisinopril Other (See Comments)    Presented with laryngeal edema - likely caused is lisinopril   . Amlodipine Swelling      The results of significant diagnostics from this hospitalization (including imaging, microbiology, ancillary and laboratory) are listed below for reference.    Significant Diagnostic Studies: Dg Abd 1 View  08/10/2014   CLINICAL DATA:  Ileus.  Distended abdomen  EXAM: ABDOMEN - 1 VIEW  COMPARISON:  08/09/2014  FINDINGS: Mild improvement in large and small bowel dilatation. Small amount of gas in the rectum. No soft tissue mass or abnormal calcification.  IMPRESSION: Mild improvement in large and small bowel dilatation compatible with improving ileus.   Electronically Signed   By: Marlan Palau M.D.   On: 08/10/2014 08:10   Dg Abd 1 View  08/07/2014   CLINICAL DATA:  Follow-up adynamic ileus.  Subsequent encounter.  EXAM: ABDOMEN - 1 VIEW  COMPARISON:  Chest and abdominal radiograph performed 08/06/2014  FINDINGS: Distention of a few small bowel loops is again noted, though air is seen filling the colon. This may reflect mild ileus, as previously noted. There is no  evidence for distal obstruction.  No free intra-abdominal air is identified, though evaluation for free air is limited on supine views. The visualized lung bases are grossly clear. No acute osseous abnormalities are identified.  IMPRESSION: Distention of a few small-bowel loops again noted, though air is seen filling the colon. This may reflect mild ileus, as previously noted. No evidence for distal obstruction. No free intra-abdominal air seen.   Electronically Signed   By: Roanna RaiderJeffery  Chang M.D.   On: 08/07/2014 10:27   Dg Abd 1 View  08/04/2014   CLINICAL DATA:  Abdominal distension  EXAM: ABDOMEN - 1 VIEW  COMPARISON:  CT scan 08/03/2014  FINDINGS: Air-filled small bowel and colon with mild distention suggesting a diffuse ileus. No  obvious free air. There is an NG tube in the stomach.  IMPRESSION: Diffuse ileus bowel gas pattern.   Electronically Signed   By: Rudie MeyerP.  Gallerani M.D.   On: 08/04/2014 15:41   Ct Head Wo Contrast  08/03/2014   CLINICAL DATA:  Altered mental status and confusion. Initial encounter.  EXAM: CT HEAD WITHOUT CONTRAST  TECHNIQUE: Contiguous axial images were obtained from the base of the skull through the vertex without intravenous contrast.  COMPARISON:  Neck CT 05/22/2012.  FINDINGS: Age expected atrophy is present. There is mild motion artifact present on the examination. No mass lesion, mass effect, midline shift, hydrocephalus, hemorrhage. No territorial ischemia or acute infarction. Mastoid air cells are clear. Intracranial atherosclerosis.  IMPRESSION: No acute intracranial abnormality.  Age-appropriate atrophy.   Electronically Signed   By: Andreas NewportGeoffrey  Lamke M.D.   On: 08/03/2014 15:16   Ct Abdomen Pelvis W Contrast  08/03/2014   CLINICAL DATA:  74 year old male with a history of rectal bleeding and abdominal pain.  EXAM: CT ABDOMEN AND PELVIS WITH CONTRAST  TECHNIQUE: Multidetector CT imaging of the abdomen and pelvis was performed using the standard protocol following bolus administration of intravenous contrast.  CONTRAST:  100mL OMNIPAQUE IOHEXOL 300 MG/ML  SOLN  COMPARISON:  Chest CT 05/31/2012, ultrasound abdomen 08/03/2014  FINDINGS: Lower chest:  Unremarkable appearance of the soft tissues of the chest wall.  Heart size borderline enlarged.  No pericardial fluid/thickening.  No lower mediastinal adenopathy.  Unremarkable appearance of the distal esophagus.  Small hiatal hernia.  Evidence of esophageal varices.  No confluent airspace disease, pleural fluid, or pneumothorax within visualized lung.  Abdomen/pelvis:  Cirrhotic changes of the liver including nodular liver surface, present on comparison ultrasound.  Unremarkable appearance of the spleen.  Unremarkable appearance the bilateral adrenal glands.   Unremarkable appearance of pancreas.  Gallbladder appears somewhat distended with mild associated inflammatory changes of the pericholecystic fat. Trace free fluid adjacent to the gallbladder.  Inflammatory changes adjacent to the gallbladder extend medially, continuous with the second portion of the duodenum. Ill-defined duodenum wall with associated inflammatory changes on 33 of series 2. Adjacent small lymph nodes. No free air. Small duodenum diverticulum at the pancreatic head. Circumferential duodenum wall thickening in the third portion the duodenum.  No evidence of right-sided hydronephrosis or nephrolithiasis. 2 cm lesion at the superior medial cortex measures low Hounsfield units on 2 series, compatible with Bosniak 1 cyst. Low-density lesion at the inferior cortex does not enhance, compatible with benign cyst.  No evidence of left-sided hydronephrosis or nephrolithiasis. Sub cm lesions on the left kidney, too small to completely characterize.  No abnormally distended small bowel or colon. Circumferential wall thickening with associated inflammatory changes of the third portion the duodenum. No transition  point of the small bowel or colon. No significant stool burden. Multiple colonic diverticula, without associated inflammatory changes of the mesenteric. Normal appendix.  Trace free fluid within the dependent aspects of the abdomen/pelvis.  Evidence of esophageal varices at the lower mediastinum, with engorged right gastric vein and omental veins adjacent to the lesser curvature. No engorged spleen renal shunt.  No significant atherosclerotic disease of the abdominal aorta and iliac vessels. No aneurysm. No dissection flap.  Mesenteric vessels remain patent. Tortuosity of the bilateral iliac system.  Unremarkable appearance of the urinary bladder.  Transverse prostate measures 4.4 cm.  No acute fracture identified. Multilevel degenerative changes of the visualized spine.  IMPRESSION: Inflammatory changes  involving the third portion the duodenum, concerning for duodenal ulcer/inflammation. Alternative differential diagnosis includes inflammation related to a groove pancreatitis, or other causes of duodenitis. Correlation with upper endoscopy may be useful.  Borderline dilated gallbladder, without radiopaque stones. If there is concern for cholecystitis, correlation with lab values, physical exam, and nuclear medicine HIDA study may be useful.  Changes of cirrhosis with evidence of portal hypertension and developing esophageal varices.  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  Reactive free fluid within the right abdomen and pelvis.  These results were called by telephone at the time of interpretation on 08/03/2014 at 3:47 pm to Dr. Randol Kern, who verbally acknowledged these results.  Signed,  Yvone Neu. Loreta Ave, DO  [facility]  [facility]   Electronically Signed   By: Gilmer Mor D.O.   On: 08/03/2014 15:48   US Abdomen Limited  08/09/2014   CLINICAL DATA:  Ascites, potential paracentesis  EXAM: LIMITED ABDOMINAL ULTRASOUND  COMPARISON:  CT scan 08/03/2014  FINDINGS: There is only small amount of perihepatic ascites not enough for paracentesis. No ascites is noted in lower quadrants of the abdomen.  IMPRESSION: Only small amount of perihepatic ascites. No ascites is noted in lower quadrants of the abdomen.   Electronically Signed   By: Natasha Mead M.D.   On: 08/09/2014 11:19   Dg Chest Port 1 View  08/10/2014   CLINICAL DATA:  Leukocytosis, CHF  EXAM: PORTABLE CHEST - 1 VIEW  COMPARISON:  08/06/2014  FINDINGS: Cardiac shadow is stable. The nasogastric catheter is been removed. A right-sided PICC line is again identified in the proximal superior vena cava. The lungs are well aerated bilaterally. Some very minimal linear atelectasis is noted in the bases bilaterally and stable. No focal confluent infiltrate is seen.  IMPRESSION: Interval  removal of nasogastric catheter. Bibasilar atelectatic changes are again seen.   Electronically Signed   By: Alcide Clever M.D.   On: 08/10/2014 10:01   Dg Abd Acute W/chest  08/06/2014   CLINICAL DATA:  Follow-up ileus  EXAM: DG ABDOMEN ACUTE W/ 1V CHEST  COMPARISON:  08/05/2014  FINDINGS: Nasogastric tube is appropriately positioned. Lungs are hypoaerated with crowding of the bronchovascular markings. Moderate enlargement of the cardiomediastinal silhouette noted with central vascular congestion but no overt edema. No free air beneath the diaphragms.  Interval subjective increase in gaseous distention of multiple loops of bowel throughout the abdomen, with differential air-fluid levels on decubitus imaging. No new abnormal radiopacity or acute osseous abnormality.  IMPRESSION: Subjective interval increase in gaseous prominence of multiple loops of bowel in a pattern most typical for ileus.   Electronically Signed   By: Christiana Pellant M.D.   On: 08/06/2014 15:29   Dg Abd Acute W/chest  08/05/2014   CLINICAL DATA:  Abdominal distention anemia  EXAM: DG ABDOMEN ACUTE W/ 1V CHEST  COMPARISON:  X-ray abdomen 08/04/2014  FINDINGS: NG tube extends the stomach. There is central venous pulmonary congestion. No free air beneath hemidiaphragms.  Gas distended loops of large and small bowel. Loops of small bowel are mildly dilated 3.6 cm. Loops colon within normal limits. There is gas rectum. On the decubitus view, there are air-fluid levels within the bowel. No intraperitoneal free air.  IMPRESSION: Gas-filled loops of large and small bowel with minimal distention and air-fluid levels suggesting ileus. No intraperitoneal free air.   Electronically Signed   By: Genevive Bi M.D.   On: 08/05/2014 08:45   Dg Abd Portable 1v  08/13/2014   CLINICAL DATA:  Ileus.  EXAM: PORTABLE ABDOMEN - 1 VIEW  COMPARISON:  08/10/2014.  FINDINGS: Soft tissue structures are unremarkable. Continued improvement of small and large bowel  distention. Findings consistent with improving adynamic ileus. No pathologic intra-abdominal calcifications noted. No acute bony abnormality.  IMPRESSION: Findings consistent with continued improvement of adynamic ileus.   Electronically Signed   By: Maisie Fus  Register   On: 08/13/2014 08:00   Dg Abd Portable 1v  08/09/2014   CLINICAL DATA:  Ileus  EXAM: PORTABLE ABDOMEN - 1 VIEW  COMPARISON:  08/08/2014  FINDINGS: Persistent gaseous distended small bowel loops consistent with significant ileus or partial small bowel obstruction.  IMPRESSION: Persistent gaseous distended small bowel loops consistent with significant ileus or small bowel obstruction.   Electronically Signed   By: Natasha Mead M.D.   On: 08/09/2014 08:35   Dg Abd Portable 1v  08/08/2014   CLINICAL DATA:  Acute generalized abdominal pain.  EXAM: PORTABLE ABDOMEN - 1 VIEW  COMPARISON:  August 07, 2014.  FINDINGS: There appears to be significantly increased small bowel distention concerning for distal small bowel obstruction. No definite colonic dilatation is noted. No abnormal calcifications are noted.  IMPRESSION: Significantly increased small bowel distention is noted concerning for distal small bowel obstruction.   Electronically Signed   By: Lupita Raider, M.D.   On: 08/08/2014 08:52   US Abdomen Limited Ruq  08/03/2014   CLINICAL DATA:  GI bleeding, elevated LFTs, hypertension, ethanol abuse, obesity  EXAM: US ABDOMEN LIMITED - RIGHT UPPER QUADRANT  COMPARISON:  08/02/2014  FINDINGS: Gallbladder:  Distended without stones. Patient sedated, unable to adequately assess for presence of a sonographic Murphy sign. Gallbladder wall appears mildly thickened. No pericholecystic fluid.  Common bile duct:  Diameter: Suboptimally visualized, only a short segment at the porta hepatis seen, 5 mm in diameter, normal  Liver:  Heterogeneous in echogenicity and slightly nodular in appearance suggesting cirrhosis. No discrete hepatic mass or intrahepatic  biliary dilatation. No flow was detected in the main portal or LEFT/RIGHT portal veins on color Doppler imaging, though though uncertain if this represents thrombosis, portal hypertension, or technical artifact.  No RIGHT upper quadrant free fluid.  IMPRESSION: Cirrhotic appearing liver.  Unable to detect flow within the portal vein though this could be related to technical limitations of exam; a subsequent CT abdomen exam (please refer to that report) confirms patency of the portal vein.  Nonspecific thickening of gallbladder wall without visualization of calculi.  Cannot exclude acute cholecystitis with this appearance; consider radionuclide hepatobiliary imaging to assess patency of the cystic duct and exclude acute cholecystitis.   Electronically Signed   By: Ulyses Southward M.D.   On: 08/03/2014 15:14    Microbiology: No results found for this or any  previous visit (from the past 240 hour(s)).   Labs: Basic Metabolic Panel: No results for input(s): NA, K, CL, CO2, GLUCOSE, BUN, CREATININE, CALCIUM, MG, PHOS in the last 168 hours. Liver Function Tests: No results for input(s): AST, ALT, ALKPHOS, BILITOT, PROT, ALBUMIN in the last 168 hours. No results for input(s): LIPASE, AMYLASE in the last 168 hours. No results for input(s): AMMONIA in the last 168 hours. CBC: No results for input(s): WBC, NEUTROABS, HGB, HCT, MCV, PLT in the last 168 hours. Cardiac Enzymes: No results for input(s): CKTOTAL, CKMB, CKMBINDEX, TROPONINI in the last 168 hours. BNP: BNP (last 3 results)  Recent Labs  08/03/14 1730  BNP 35.6    ProBNP (last 3 results) No results for input(s): PROBNP in the last 8760 hours.  CBG: No results for input(s): GLUCAP in the last 168 hours.     SignedKathlen Mody  [facility] 08/26/2014, 9:20 PM

## 2014-09-06 ENCOUNTER — Other Ambulatory Visit (INDEPENDENT_AMBULATORY_CARE_PROVIDER_SITE_OTHER): Payer: Medicare Other

## 2014-09-06 ENCOUNTER — Ambulatory Visit (INDEPENDENT_AMBULATORY_CARE_PROVIDER_SITE_OTHER)
Admission: RE | Admit: 2014-09-06 | Discharge: 2014-09-06 | Disposition: A | Discharge status: Home / Self Care | Payer: Medicare Other | Source: Ambulatory Visit | Attending: Nurse Practitioner | Admitting: Nurse Practitioner

## 2014-09-06 ENCOUNTER — Encounter: Payer: Self-pay | Admitting: Nurse Practitioner

## 2014-09-06 ENCOUNTER — Ambulatory Visit (INDEPENDENT_AMBULATORY_CARE_PROVIDER_SITE_OTHER): Payer: Medicare Other | Admitting: Nurse Practitioner

## 2014-09-06 VITALS — BP 104/62 | HR 72 | Ht 67.0 in | Wt 201.0 lb

## 2014-09-06 DIAGNOSIS — I8500 Esophageal varices without bleeding: Secondary | ICD-10-CM

## 2014-09-06 DIAGNOSIS — R059 Cough, unspecified: Secondary | ICD-10-CM

## 2014-09-06 DIAGNOSIS — R05 Cough: Secondary | ICD-10-CM

## 2014-09-06 DIAGNOSIS — K7469 Other cirrhosis of liver: Secondary | ICD-10-CM

## 2014-09-06 LAB — BASIC METABOLIC PANEL
BUN: 11 mg/dL (ref 6–23)
CO2: 27 meq/L (ref 19–32)
Calcium: 9 mg/dL (ref 8.4–10.5)
Chloride: 105 mEq/L (ref 96–112)
Creatinine, Ser: 0.61 mg/dL (ref 0.40–1.50)
GFR: 166.71 mL/min (ref >60.00)
GLUCOSE: 102 mg/dL — AB (ref 70–99)
POTASSIUM: 3.4 meq/L — AB (ref 3.5–5.1)
Sodium: 139 mEq/L (ref 135–145)

## 2014-09-06 LAB — BRAIN NATRIURETIC PEPTIDE: Pro B Natriuretic peptide (BNP): 30 pg/mL (ref 0.0–100.0)

## 2014-09-06 NOTE — Patient Instructions (Signed)
Please go to the basement level to have your labs drawn and to Radiology for a chest x-ray.  You have been scheduled for an endoscopy. Please follow written instructions given to you at your visit today. If you use inhalers (even only as needed), please bring them with you on the day of your procedure.  We will call you with the lab and chest x-ray results. We made you a follow up appointment with Dr. Christella HartiganJacobs after the hospital procedure.  Date for follow up is 11-12-2014 at 2:00 PM here in our office.

## 2014-09-08 DIAGNOSIS — R05 Cough: Secondary | ICD-10-CM | POA: Insufficient documentation

## 2014-09-08 DIAGNOSIS — R059 Cough, unspecified: Secondary | ICD-10-CM | POA: Insufficient documentation

## 2014-09-08 DIAGNOSIS — I8500 Esophageal varices without bleeding: Secondary | ICD-10-CM | POA: Insufficient documentation

## 2014-09-08 NOTE — Progress Notes (Signed)
i agree with the above note, plan 

## 2014-09-08 NOTE — Progress Notes (Signed)
     History of Present Illness:  Patient is a 74 year old male who we recently saw in the hospital for decompensated ETOH cirrhosis. He is status post banding of large esophageal varices 08/07/14 after presenting with an upper GI bleed. Patient was also treated for encephalopathy and an ileus.   Patient is in today with his daughter, also power of attorney. Patient looks and feels quite well. There have been a couple of instances at home and daughter all patient was hallucinating (saw ants on wall) but for the most part he is been thinking clearly. Having 2-3 bowel movements on lactulose, he is still on Xifaxan. No increasing abdominal girth. He is on low-dose diarrhetics. States he is compliant with low-sodium diet. Most recent renal function 08/09/14 was normal.  Patient complains of dry cough lately. No significant SOB  Current Medications, Allergies, Past Medical History, Past Surgical History, Family History and Social History were reviewed in [facility] electronic medical record.  Physical Exam: General: Pleasant, well developed , black male in no acute distress Head: Normocephalic and atraumatic Eyes:  sclerae anicteric, conjunctiva pink  Ears: Normal auditory acuity Lungs: Clear throughout to auscultation Heart: Regular rate and rhythm Abdomen: Soft, protuberant, non-tender. No masses. Normal bowel sounds Musculoskeletal: Symmetrical with no gross deformities  Extremities: 2-3+ BLE edema.   Neurological: Alert oriented x 4, grossly nonfocal. No asterixis Psychological:  Alert and cooperative. Normal mood and affect  Assessment and Recommendations:  1. Pleasant 74 year old male recently hospitalized with decompensated alcoholic cirrhosis and variceal bleed. He is status post banding of large varices mid June. Patient needs follow-up EGD at the hospital in the next couple of weeks. The benefits, risks, and potential complications of EGD  were discussed with the patient and he  agrees to proceed. Continue PPI for now.   2. Encephalopathy, resolved. Daughter had a lot of questions about assessing patient's mental status at home. We talked about the mini mental status check. She knows to notify us of behavior changes, altered sleeping patterns, confusion, etc.. Continue Xifaxan and lactulose.  3. Pedal edema. Patient is on a very low dose of diuretics. Following a low-sodium diet. No appreciable ascites on exam today. Daughter gives a history of heart failure. Will check renal function, BNP.  I think there is room to go up on the diabetic as long as renal function checked on a regular basis and this can be monitored by PCP  4. Cough, no significant SOB. He has pedal edema, no appreciable ascites. Will get CXR though I doubt fluid will be present as lungs sound ok.

## 2014-09-14 ENCOUNTER — Telehealth: Payer: Self-pay | Admitting: Nurse Practitioner

## 2014-09-14 NOTE — Telephone Encounter (Signed)
Phone number listed is not accepting any further messages, mobile number called and message left.

## 2014-09-15 ENCOUNTER — Telehealth: Payer: Self-pay | Admitting: Gastroenterology

## 2014-09-15 NOTE — Telephone Encounter (Signed)
See alternate note  

## 2014-09-15 NOTE — Telephone Encounter (Signed)
I spoke with Keith [name] the physical therapist and his daughter.  They will ensure that he is taking his meds and will call back for any additional questions or concerns

## 2014-09-15 NOTE — Telephone Encounter (Signed)
Pt complains of twitching in the arms and, hallucinations (seeing bugs)  Doesn't seem confused otherwise.  No pain.  [phone]  Daughters cell would like a call

## 2014-09-15 NOTE — Telephone Encounter (Signed)
Sheri, please call daughter. If not confused then probably not encephalopathy. If "twitching" liver flaps? Please make sure he is taking xifaxan and lactulose. Thanks

## 2014-09-19 ENCOUNTER — Emergency Department (HOSPITAL_COMMUNITY)
Admission: EM | Admit: 2014-09-19 | Discharge: 2014-09-19 | Disposition: A | Discharge status: Home / Self Care | Payer: Medicare Other | Attending: Emergency Medicine | Admitting: Emergency Medicine

## 2014-09-19 ENCOUNTER — Encounter (HOSPITAL_COMMUNITY): Payer: Self-pay | Admitting: Emergency Medicine

## 2014-09-19 DIAGNOSIS — Z79899 Other long term (current) drug therapy: Secondary | ICD-10-CM | POA: Diagnosis not present

## 2014-09-19 DIAGNOSIS — Z792 Long term (current) use of antibiotics: Secondary | ICD-10-CM | POA: Diagnosis not present

## 2014-09-19 DIAGNOSIS — E780 Pure hypercholesterolemia: Secondary | ICD-10-CM | POA: Diagnosis not present

## 2014-09-19 DIAGNOSIS — F419 Anxiety disorder, unspecified: Secondary | ICD-10-CM | POA: Insufficient documentation

## 2014-09-19 DIAGNOSIS — Z7951 Long term (current) use of inhaled steroids: Secondary | ICD-10-CM | POA: Insufficient documentation

## 2014-09-19 DIAGNOSIS — D649 Anemia, unspecified: Secondary | ICD-10-CM | POA: Diagnosis not present

## 2014-09-19 DIAGNOSIS — I10 Essential (primary) hypertension: Secondary | ICD-10-CM | POA: Diagnosis not present

## 2014-09-19 DIAGNOSIS — Z8719 Personal history of other diseases of the digestive system: Secondary | ICD-10-CM | POA: Insufficient documentation

## 2014-09-19 DIAGNOSIS — F329 Major depressive disorder, single episode, unspecified: Secondary | ICD-10-CM | POA: Diagnosis not present

## 2014-09-19 DIAGNOSIS — R251 Tremor, unspecified: Secondary | ICD-10-CM | POA: Diagnosis not present

## 2014-09-19 LAB — COMPREHENSIVE METABOLIC PANEL
ALK PHOS: 71 U/L (ref 38–126)
ALT: 16 U/L — ABNORMAL LOW (ref 17–63)
AST: 42 U/L — AB (ref 15–41)
Albumin: 3 g/dL — ABNORMAL LOW (ref 3.5–5.0)
Anion gap: 6 (ref 5–15)
BUN: 21 mg/dL — ABNORMAL HIGH (ref 6–20)
CO2: 26 mmol/L (ref 22–32)
Calcium: 9.1 mg/dL (ref 8.9–10.3)
Chloride: 107 mmol/L (ref 101–111)
Creatinine, Ser: 0.87 mg/dL (ref 0.61–1.24)
GFR calc Af Amer: >60 mL/min (ref >60)
GFR calc non Af Amer: >60 mL/min (ref >60)
GLUCOSE: 117 mg/dL — AB (ref 65–99)
POTASSIUM: 4 mmol/L (ref 3.5–5.1)
Sodium: 139 mmol/L (ref 135–145)
Total Bilirubin: 0.9 mg/dL (ref 0.3–1.2)
Total Protein: 7.3 g/dL (ref 6.5–8.1)

## 2014-09-19 NOTE — ED Notes (Signed)
Bed: JX91 Expected date: 09/19/14 Expected time: 11:56 AM Means of arrival: Ambulance Comments: Twitching in hands

## 2014-09-19 NOTE — ED Notes (Signed)
Awake. Verbally responsive. A/O x4. Resp even and unlabored. No audible adventitious breath sounds noted. ABC's intact.  

## 2014-09-19 NOTE — Discharge Instructions (Signed)

## 2014-09-19 NOTE — ED Notes (Signed)
Pt arrived via EMs with report of new onset of tremors to bil upper ext and shuffling gait (per EMS). Denies falling but has trouble keeping balance. Denies headache, dizziness/lightheadedness, nausea or vision problems.

## 2014-09-22 ENCOUNTER — Encounter: Payer: Self-pay | Admitting: Diagnostic Neuroimaging

## 2014-09-22 ENCOUNTER — Ambulatory Visit (INDEPENDENT_AMBULATORY_CARE_PROVIDER_SITE_OTHER): Payer: Medicare Other | Admitting: Diagnostic Neuroimaging

## 2014-09-22 VITALS — BP 110/55 | HR 64 | Ht 67.0 in | Wt 203.0 lb

## 2014-09-22 DIAGNOSIS — F1011 Alcohol abuse, in remission: Secondary | ICD-10-CM | POA: Insufficient documentation

## 2014-09-22 DIAGNOSIS — G252 Other specified forms of tremor: Secondary | ICD-10-CM

## 2014-09-22 DIAGNOSIS — R279 Unspecified lack of coordination: Secondary | ICD-10-CM

## 2014-09-22 DIAGNOSIS — K7030 Alcoholic cirrhosis of liver without ascites: Secondary | ICD-10-CM

## 2014-09-22 DIAGNOSIS — G253 Myoclonus: Secondary | ICD-10-CM | POA: Diagnosis not present

## 2014-09-22 DIAGNOSIS — R278 Other lack of coordination: Secondary | ICD-10-CM

## 2014-09-22 DIAGNOSIS — F1010 Alcohol abuse, uncomplicated: Secondary | ICD-10-CM | POA: Diagnosis not present

## 2014-09-22 NOTE — Progress Notes (Signed)
[facility]  PATIENT: Keith [name] DOB: Mar 08, 1940  REFERRING CLINICIAN: Radford Pax HISTORY FROM: patient  REASON FOR VISIT: new consult    HISTORICAL  CHIEF COMPLAINT:  Chief Complaint  Patient presents with  . Tremors    rm 7 , New Patient, daughter - [place], "tremors in both hands/arms, moving to other areas"    HISTORY OF PRESENT ILLNESS:   74 year old right-handed male here for evaluation of tremors. Patient has significant history of alcohol abuse, ultimately leading to GI bleeding, alcohol withdrawal coma, and critical illness. Patient was intensive care unit at that time in May 2016. Since that time he has been abstinent of alcohol. Patient did have some mild intermittent tremors, which have worsened since July 2016. Last week tremor significantly worsened. Patient was having increasing problems with balance.  Patient has been under care of GI specialist, treated for hepatic encephalopathy, hyperammonemia, with Xifaxan and lactulose.    REVIEW OF SYSTEMS: Full 14 system review of systems performed and notable only for swelling in legs hearing loss shortness of breath blood in stool memory loss confusion insomnia depression anxiety decreased energy disinterest in activities hallucinations anemia easy bruising easy bleeding.  ALLERGIES: Allergies  Allergen Reactions  . Lisinopril Other (See Comments)    Presented with laryngeal edema - likely caused is lisinopril   . Amlodipine Swelling    HOME MEDICATIONS: Outpatient Prescriptions Prior to Visit  Medication Sig Dispense Refill  . albuterol (PROVENTIL HFA;VENTOLIN HFA) 108 (90 BASE) MCG/ACT inhaler Inhale 2 puffs into the lungs every 6 (six) hours as needed for wheezing. 1 Inhaler 2  . carvedilol (COREG) 25 MG tablet TAKE 1 TABLET BY MOUTH TWICE A DAY 30 tablet 0  . EPINEPHrine (EPI-PEN) 0.3 mg/0.3 mL DEVI Inject 0.3 mg into the muscle as needed (for allergic reaction).     . FERREX 150 150  MG capsule Take 150 mg by mouth daily.  5  . fluticasone (FLONASE) 50 MCG/ACT nasal spray Place 1 spray into both nostrils 2 (two) times daily as needed. For nasal congestion.    . folic acid (FOLVITE) 1 MG tablet Take 1 tablet (1 mg total) by mouth daily.    . furosemide (LASIX) 20 MG tablet Take 20 mg by mouth daily.  0  . ipratropium (ATROVENT) 0.06 % nasal spray Place 2 sprays into the nose 4 (four) times daily as needed (for nasal congestion).     Marland Kitchen lactulose (CHRONULAC) 10 GM/15ML solution Take 30 mLs (20 g total) by mouth 3 (three) times daily. 240 mL 0  . mometasone (NASONEX) 50 MCG/ACT nasal spray Place 2 sprays into the nose daily as needed (nasal congestion).     . pantoprazole (PROTONIX) 40 MG tablet Take 1 tablet (40 mg total) by mouth daily. 30 tablet 0  . QUEtiapine (SEROQUEL) 25 MG tablet Take 1 tablet (25 mg total) by mouth 2 (two) times daily. 30 tablet 0  . rifaximin (XIFAXAN) 550 MG TABS tablet Take 1 tablet (550 mg total) by mouth 2 (two) times daily. 60 tablet 1  . spironolactone (ALDACTONE) 25 MG tablet Take 25 mg by mouth daily.  0  . thiamine 100 MG tablet Take 1 tablet (100 mg total) by mouth daily.     No facility-administered medications prior to visit.    PAST MEDICAL HISTORY: Past Medical History  Diagnosis Date  . HTN (hypertension)   . Hypercholesterolemia   . Alcohol abuse 2014    states last drink 07/03/14  . A-fib  05/2012    in the setting of acute medical illness/ respiratory arrest. Not placed on anticoagulant.   . History of GI bleed ~ 1990    bleeding from ulcer  . History of cardiac arrest 05/2012    due to tracheostomy site bleeding and airway obstruction  . Angioedema 05/2012    with acute resp failure and hypoxia due to Lisinopril  . Respiratory arrest 05/2012    caused by tracheostomy site bleeding and subsequent airway obstruciton.   . Anemia 05/2012, 07/2014.     normocytic 2014, macrocytic 2016  . Cirrhosis 07/2014  . Depression   . Anxiety      PAST SURGICAL HISTORY: Past Surgical History  Procedure Laterality Date  . Tracheostomy  05/22/12    emergent, angioedema  . Tracheostomy tube placement N/A 05/31/2012    Procedure: TRACHEOSTOMY;  Surgeon: Flo Shanks, MD;  Location: [facility] OR;  Service: ENT;  Laterality: N/A;  . Laryngoscopy N/A 05/31/2012    Procedure: LARYNGOSCOPY;  Surgeon: Flo Shanks, MD;  Location: [facility] OR;  Service: ENT;  Laterality: N/A;  . Laryngoscopy N/A 06/19/2012    Procedure: DIRECT LARYNGOSCOPY AND BRONCHOSCOPY;  Surgeon: [name], MD;  Location: [place] OR;  Service: ENT;  Laterality: N/A;  . Tracheostomy revision N/A 06/19/2012    Procedure: TRACHEOSTOMY DECANNULATION;  Surgeon: [name], MD;  Location: [facility] OR;  Service: ENT;  Laterality: N/A;  MICROSCOPE/TELESCOPE TRACHEOSTOMY REVISION  . Esophagogastroduodenoscopy N/A 08/07/2014    Procedure: ESOPHAGOGASTRODUODENOSCOPY (EGD);  Surgeon: Rachael Fee, MD;  Location: [facility];  Service: Endoscopy;  Laterality: N/A;  . Esophagogastroduodenoscopy N/A 08/10/2014    Procedure: ESOPHAGOGASTRODUODENOSCOPY (EGD);  Surgeon: Iva Boop, MD;  Location: [facility];  Service: Endoscopy;  Laterality: N/A;  bedside   . Flexible sigmoidoscopy N/A 08/10/2014    Procedure: FLEXIBLE SIGMOIDOSCOPY;  Surgeon: Iva Boop, MD;  Location: [facility]COPY;  Service: Endoscopy;  Laterality: N/A;  bedside    FAMILY HISTORY: Family History  Problem Relation Age of Onset  . Breast cancer Mother   . Stroke Father     SOCIAL HISTORY:  History   Social History  . Marital Status: Single    Spouse Name: N/A  . Number of Children: 2  . Years of Education: 12   Occupational History  . retired 2008     worked for [employer]   Social History Main Topics  . Smoking status: Never Smoker   . Smokeless tobacco: Not on file  . Alcohol Use: 0.0 oz/week    0 Standard drinks or equivalent per week     Comment: drinks daily. in 07/2012 drinking of 16-24 oz vodka  daily reported. 09/22/14 none since 07/03/14  . Drug Use: No  . Sexual Activity: Not on file   Other Topics Concern  . Not on file   Social History Narrative   Single , lives with daughter in home   Caffeine use - coffee 1-2 Day     PHYSICAL EXAM  GENERAL EXAM/CONSTITUTIONAL: Vitals:  Filed Vitals:   09/22/14 0937  BP: 110/55  Pulse: 64  Height:  (1.702 m)  Weight: 203 lb (92.08 kg)     Body mass index is 31.79 kg/(m^2).  Visual Acuity Screening   Right eye Left eye Both eyes  Without correction: 20/200    With correction:     Comments: 09/22/14 unable to see any letters    Patient is in no distress; well developed, nourished and groomed; neck is supple  CARDIOVASCULAR:  Examination of carotid arteries is normal; no carotid bruits  Regular rate and rhythm, no murmurs  Examination of peripheral vascular system by observation and palpation is normal  EYES:  Ophthalmoscopic exam of optic discs and posterior segments is normal; no papilledema or hemorrhages  MUSCULOSKELETAL:  Gait, strength, tone, movements noted in Neurologic exam below  NEUROLOGIC: MENTAL STATUS:  No flowsheet data found.  awake, alert, oriented to person, place and time  recent and remote memory intact  normal attention and concentration  language fluent, comprehension intact, naming intact,   fund of knowledge appropriate  CRANIAL NERVE:   2nd - no papilledema on fundoscopic exam  2nd, 3rd, 4th, 6th - pupils equal and reactive to light, visual fields full to confrontation, extraocular muscles intact, no nystagmus  5th - facial sensation symmetric  7th - facial strength symmetric  8th - hearing intact  9th - palate elevates symmetrically, uvula midline  11th - shoulder shrug symmetric  12th - tongue protrusion midline  MOTOR:   normal bulk; MILD POSTURAL AND ACTION TREMOR; MILD ASTERIXIS IN HANDS; DIFFUSE 4+ strength in the BUE, BLE  SENSORY:   normal and  symmetric to light touch, temperature, vibration  COORDINATION:   finger-nose-finger, fine finger movements normal  REFLEXES:   deep tendon reflexes TRACE and symmetric; ABSENT AT ANKLES  GAIT/STATION:   narrow based gait; CANNOT TANDEM TOE OR HEEL WALK; romberg is negative    DIAGNOSTIC DATA (LABS, IMAGING, TESTING) - I reviewed patient records, labs, notes, testing and imaging myself where available.  Lab Results  Component Value Date   WBC 10.6* 08/16/2014   HGB 7.2* 08/16/2014   HCT 21.9* 08/16/2014   MCV 94.4 08/16/2014   PLT 175 08/16/2014      Component Value Date/Time   NA 139 09/19/2014 1321   K 4.0 09/19/2014 1321   CL 107 09/19/2014 1321   CO2 26 09/19/2014 1321   GLUCOSE 117* 09/19/2014 1321   BUN 21* 09/19/2014 1321   CREATININE 0.87 09/19/2014 1321   CALCIUM 9.1 09/19/2014 1321   PROT 7.3 09/19/2014 1321   ALBUMIN 3.0* 09/19/2014 1321   AST 42* 09/19/2014 1321   ALT 16* 09/19/2014 1321   ALKPHOS 71 09/19/2014 1321   BILITOT 0.9 09/19/2014 1321   GFRNONAA >60 09/19/2014 1321   GFRAA >60 09/19/2014 1321   No results found for: CHOL, HDL, LDLCALC, LDLDIRECT, TRIG, CHOLHDL No results found for: WUJW1X No results found for: VITAMINB12 Lab Results  Component Value Date   TSH 1.678 06/02/2012    08/03/14 CT HEAD [I reviewed images myself and agree with interpretation. -VRP]  - No acute intracranial abnormality. Age-appropriate atrophy.    ASSESSMENT AND PLAN  74 y.o. year old male here with significant history of chronic alcohol abuse, liver cirrhosis, hepatic encephalopathy, now with progressive tremors, balance difficulties, asterixis. Most likely represents toxic metabolic etiology related to chronic alcohol abuse. We'll recheck ammonia level. Advised patient to follow up with GI clinic regarding management of hyperammonemia.  Dx:  Postural tremor - Plan: Ammonia  Myoclonus - Plan: Ammonia  Asterixis - Plan: Ammonia  Alcohol abuse, in  remission - Plan: Ammonia  Alcoholic cirrhosis of liver without ascites - Plan: Ammonia    PLAN:  Orders Placed This Encounter  Procedures  . Ammonia   Return in about 2 months (around 11/22/2014).    Suanne Marker, MD 09/22/2014, 10:33 AM Certified in Neurology, Neurophysiology and Neuroimaging  [facility] [address], [address]  [place], [place] [address] [phone]

## 2014-09-22 NOTE — Patient Instructions (Signed)
I will check ammonia level.  Take multivitamin, folic acid, thiamine.

## 2014-09-23 LAB — AMMONIA: Ammonia: 248 ug/dL (ref 27–102)

## 2014-09-24 ENCOUNTER — Telehealth: Payer: Self-pay | Admitting: Diagnostic Neuroimaging

## 2014-09-24 ENCOUNTER — Other Ambulatory Visit: Payer: Self-pay

## 2014-09-24 ENCOUNTER — Other Ambulatory Visit (INDEPENDENT_AMBULATORY_CARE_PROVIDER_SITE_OTHER): Payer: Medicare Other

## 2014-09-24 DIAGNOSIS — K7469 Other cirrhosis of liver: Secondary | ICD-10-CM

## 2014-09-24 LAB — URINALYSIS, ROUTINE W REFLEX MICROSCOPIC
BILIRUBIN URINE: NEGATIVE
Hgb urine dipstick: NEGATIVE
Ketones, ur: NEGATIVE
LEUKOCYTES UA: NEGATIVE
NITRITE: NEGATIVE
RBC / HPF: NONE SEEN (ref 0–?)
SPECIFIC GRAVITY, URINE: 1.01 (ref 1.000–1.030)
Total Protein, Urine: NEGATIVE
URINE GLUCOSE: NEGATIVE
UROBILINOGEN UA: 0.2 (ref 0.0–1.0)
WBC, UA: NONE SEEN (ref 0–?)
pH: 6 (ref 5.0–8.0)

## 2014-09-24 LAB — CBC WITH DIFFERENTIAL/PLATELET
BASOS ABS: 0 10*3/uL (ref 0.0–0.1)
Basophils Relative: 0.5 % (ref 0.0–3.0)
EOS ABS: 0.2 10*3/uL (ref 0.0–0.7)
Eosinophils Relative: 4 % (ref 0.0–5.0)
HCT: 24.8 % — ABNORMAL LOW (ref 39.0–52.0)
Hemoglobin: 8.1 g/dL — ABNORMAL LOW (ref 13.0–17.0)
Lymphocytes Relative: 32.5 % (ref 12.0–46.0)
Lymphs Abs: 1.7 10*3/uL (ref 0.7–4.0)
MCHC: 32.5 g/dL (ref 30.0–36.0)
MCV: 84.8 fl (ref 78.0–100.0)
MONO ABS: 0.9 10*3/uL (ref 0.1–1.0)
MONOS PCT: 17.6 % — AB (ref 3.0–12.0)
Neutro Abs: 2.4 10*3/uL (ref 1.4–7.7)
Neutrophils Relative %: 45.4 % (ref 43.0–77.0)
Platelets: 156 10*3/uL (ref 150.0–400.0)
RBC: 2.93 Mil/uL — ABNORMAL LOW (ref 4.22–5.81)
RDW: 18.8 % — AB (ref 11.5–15.5)
WBC: 5.4 10*3/uL (ref 4.0–10.5)

## 2014-09-24 NOTE — Telephone Encounter (Signed)
Sheri, please call patient or visiting nurse company and make sure patient is taking his Xifaxan and lactulose. His ammonia is very elevated. Please make sure he has a follow up with primary GI doc. Thanks

## 2014-09-24 NOTE — Telephone Encounter (Signed)
I called pts daughter with lab results. Ammonia sig elevated at 248 (could be partially falsely elevated; but likely at least > 100 and higher than prior measurements). Advised expedited follow up with GI. Daughter is out of town right now. Patient at home with visiting nurse. -VRP

## 2014-09-27 NOTE — Telephone Encounter (Signed)
Left message on machine to call back  

## 2014-09-27 NOTE — Telephone Encounter (Signed)
Pt's daughter states the mental status is normal and only is concerned about his ammonia level

## 2014-09-27 NOTE — Telephone Encounter (Signed)
The tremors are probably asterixis (shaking from liver disease).  He is already on xifaxan twice daily and lactulose as well.  Have him increase his lactulose so that he is taking it once more per day and ask that they call back later this week to report.

## 2014-09-27 NOTE — Telephone Encounter (Signed)
Please call and ask about his mental status.

## 2014-09-27 NOTE — Telephone Encounter (Signed)
Dr Christella Hartigan does this pt need to come in sooner for evaluation?

## 2014-09-28 NOTE — ED Provider Notes (Signed)
CSN: [id number]     Arrival date & time 09/19/14  1159 History   First MD Initiated Contact with Patient 09/19/14 1241     Chief Complaint  Patient presents with  . Tremors      HPI Pt arrived via EMs with report of new onset of tremors to bil upper ext and shuffling gait (per EMS). Denies falling but has trouble keeping balance. Denies headache, dizziness/lightheadedness, nausea or vision problems. Past Medical History  Diagnosis Date  . HTN (hypertension)   . Hypercholesterolemia   . Alcohol abuse 2014    states last drink 07/03/14  . A-fib 05/2012    in the setting of acute medical illness/ respiratory arrest. Not placed on anticoagulant.   . History of GI bleed ~ 1990    bleeding from ulcer  . History of cardiac arrest 05/2012    due to tracheostomy site bleeding and airway obstruction  . Angioedema 05/2012    with acute resp failure and hypoxia due to Lisinopril  . Respiratory arrest 05/2012    caused by tracheostomy site bleeding and subsequent airway obstruciton.   . Anemia 05/2012, 07/2014.     normocytic 2014, macrocytic 2016  . Cirrhosis 07/2014  . Depression   . Anxiety    Past Surgical History  Procedure Laterality Date  . Tracheostomy  05/22/12    emergent, angioedema  . Tracheostomy tube placement N/A 05/31/2012    Procedure: TRACHEOSTOMY;  Surgeon: Flo Shanks, MD;  Location: [facility] OR;  Service: ENT;  Laterality: N/A;  . Laryngoscopy N/A 05/31/2012    Procedure: LARYNGOSCOPY;  Surgeon: Flo Shanks, MD;  Location: [facility] OR;  Service: ENT;  Laterality: N/A;  . Laryngoscopy N/A 06/19/2012    Procedure: DIRECT LARYNGOSCOPY AND BRONCHOSCOPY;  Surgeon: [name], MD;  Location: [facility] OR;  Service: ENT;  Laterality: N/A;  . Tracheostomy revision N/A 06/19/2012    Procedure: TRACHEOSTOMY DECANNULATION;  Surgeon: [name], MD;  Location: [facility] OR;  Service: ENT;  Laterality: N/A;  MICROSCOPE/TELESCOPE TRACHEOSTOMY REVISION  . Esophagogastroduodenoscopy N/A 08/07/2014    Procedure:  ESOPHAGOGASTRODUODENOSCOPY (EGD);  Surgeon: Rachael Fee, MD;  Location: [facility];  Service: Endoscopy;  Laterality: N/A;  . Esophagogastroduodenoscopy N/A 08/10/2014    Procedure: ESOPHAGOGASTRODUODENOSCOPY (EGD);  Surgeon: Iva Boop, MD;  Location: [facility];  Service: Endoscopy;  Laterality: N/A;  bedside   . Flexible sigmoidoscopy N/A 08/10/2014    Procedure: FLEXIBLE SIGMOIDOSCOPY;  Surgeon: Iva Boop, MD;  Location: [facility];  Service: Endoscopy;  Laterality: N/A;  bedside   Family History  Problem Relation Age of Onset  . Breast cancer Mother   . Stroke Father    History  Substance Use Topics  . Smoking status: Never Smoker   . Smokeless tobacco: Not on file  . Alcohol Use: 0.0 oz/week    0 Standard drinks or equivalent per week     Comment: drinks daily. in 07/2012 drinking of 16-24 oz vodka daily reported. 09/22/14 none since 07/03/14    Review of Systems  Neurological: Positive for tremors. Negative for speech difficulty, numbness and headaches.  All other systems reviewed and are negative.     Allergies  Lisinopril and Amlodipine  Home Medications   Prior to Admission medications   Medication Sig Start Date End Date Taking? Authorizing Provider  albuterol (PROVENTIL HFA;VENTOLIN HFA) 108 (90 BASE) MCG/ACT inhaler Inhale 2 puffs into the lungs every 6 (six) hours as needed for wheezing. 06/20/12  Yes Russella Dar, NP  carvedilol (  COREG) 25 MG tablet TAKE 1 TABLET BY MOUTH TWICE A DAY 03/30/14  Yes Vesta Mixer, MD  EPINEPHrine (EPI-PEN) 0.3 mg/0.3 mL DEVI Inject 0.3 mg into the muscle as needed (for allergic reaction).  06/03/12  Yes Leslye Peer, MD  FERREX 150 150 MG capsule Take 150 mg by mouth daily. 09/02/14  Yes Historical Provider, MD  fluticasone (FLONASE) 50 MCG/ACT nasal spray Place 1 spray into both nostrils 2 (two) times daily as needed. For nasal congestion. 07/21/14  Yes Historical Provider, MD  folic acid (FOLVITE) 1 MG tablet Take 1  tablet (1 mg total) by mouth daily. 08/16/14  Yes Kathlen Mody, MD  furosemide (LASIX) 20 MG tablet Take 20 mg by mouth daily. 07/27/14  Yes Historical Provider, MD  ipratropium (ATROVENT) 0.06 % nasal spray Place 2 sprays into the nose 4 (four) times daily as needed (for nasal congestion).    Yes Historical Provider, MD  lactulose (CHRONULAC) 10 GM/15ML solution Take 30 mLs (20 g total) by mouth 3 (three) times daily. 08/16/14  Yes Kathlen Mody, MD  mometasone (NASONEX) 50 MCG/ACT nasal spray Place 2 sprays into the nose daily as needed (nasal congestion).    Yes Historical Provider, MD  pantoprazole (PROTONIX) 40 MG tablet Take 1 tablet (40 mg total) by mouth daily. 08/16/14  Yes Kathlen Mody, MD  QUEtiapine (SEROQUEL) 25 MG tablet Take 1 tablet (25 mg total) by mouth 2 (two) times daily. 08/16/14  Yes Kathlen Mody, MD  rifaximin (XIFAXAN) 550 MG TABS tablet Take 1 tablet (550 mg total) by mouth 2 (two) times daily. 08/16/14  Yes Kathlen Mody, MD  spironolactone (ALDACTONE) 25 MG tablet Take 25 mg by mouth daily. 07/27/14  Yes Historical Provider, MD  thiamine 100 MG tablet Take 1 tablet (100 mg total) by mouth daily. 05/31/12  Yes Vineet Sood, MD   BP 129/57 mmHg  Pulse 65  Temp(Src) 97.4 F (36.3 C) (Oral)  Resp 18  SpO2 100% Physical Exam Physical Exam  Nursing note and vitals reviewed. Constitutional: He is oriented to person, place, and time. He appears well-developed and well-nourished. No distress.  HENT:  Head: Normocephalic and atraumatic.  Eyes: Pupils are equal, round, and reactive to light.  Neck: Normal range of motion.  Cardiovascular: Normal rate and intact distal pulses.   Pulmonary/Chest: No respiratory distress.  Abdominal: Normal appearance. He exhibits no distension.  Musculoskeletal: Normal range of motion.  Neurological: He is alert and oriented to person, place, and time. No cranial nerve deficit.  Patient has resting tremor with some cogwheeling resistance and shuffling  gait.   Skin: Skin is warm and dry. No rash noted.  Psychiatric: He has a normal mood and affect. His behavior is normal.   ED Course  Procedures (including critical care time) Labs Review Labs Reviewed  COMPREHENSIVE METABOLIC PANEL - Abnormal; Notable for the following:    Glucose, Bld 117 (*)    BUN 21 (*)    Albumin 3.0 (*)    AST 42 (*)    ALT 16 (*)    All other components within normal limits    Imaging Review No results found.  I discussed findings with neurology who recommended follow-up with outpatient neurology.  MDM   Final diagnoses:  Tremor        Nelva Nay, MD 09/28/14 [phone]

## 2014-09-28 NOTE — Telephone Encounter (Signed)
Pt has been notified and will call later in the week to update

## 2014-10-05 ENCOUNTER — Encounter (HOSPITAL_COMMUNITY): Payer: Self-pay | Admitting: *Deleted

## 2014-10-06 NOTE — Anesthesia Preprocedure Evaluation (Addendum)
Anesthesia Evaluation  Patient identified by MRN, date of birth, ID band Patient awake    Reviewed: Allergy & Precautions, NPO status , Patient's Chart, lab work & pertinent test results, reviewed documented beta blocker date and time   Airway Mallampati: II      Comment: Trach scar Dental no notable dental hx. (+) Partial Upper, Partial Lower   Pulmonary  S/p trach for respiratory failure breath sounds clear to auscultation  Pulmonary exam normal       Cardiovascular hypertension, Pt. on medications and Pt. on home beta blockers +CHF Normal cardiovascular examRhythm:Regular Rate:Normal     Neuro/Psych PSYCHIATRIC DISORDERS Anxiety Depression Hepatic encephalopathy    GI/Hepatic (+) Cirrhosis -  Esophageal Varices  substance abuse  alcohol use, Esophageal varices   Endo/Other  obesity  Renal/GU negative Renal ROS  negative genitourinary   Musculoskeletal negative musculoskeletal ROS (+)   Abdominal   Peds negative pediatric ROS (+)  Hematology  (+) anemia ,   Anesthesia Other Findings   Reproductive/Obstetrics negative OB ROS                            Anesthesia Physical Anesthesia Plan  ASA: III  Anesthesia Plan: MAC   Post-op Pain Management:    Induction: Intravenous  Airway Management Planned: Nasal Cannula  Additional Equipment:   Intra-op Plan:   Post-operative Plan:   Informed Consent: I have reviewed the patients History and Physical, chart, labs and discussed the procedure including the risks, benefits and alternatives for the proposed anesthesia with the patient or authorized representative who has indicated his/her understanding and acceptance.   Dental advisory given  Plan Discussed with: CRNA and Anesthesiologist  Anesthesia Plan Comments: (Discussed risks/benefits/alternatives to MAC sedation including need for ventilatory support, hypotension, need for  conversion to general anesthesia.  All patient questions answered.  Patient wished to proceed.)        Anesthesia Quick Evaluation

## 2014-10-07 ENCOUNTER — Encounter (HOSPITAL_COMMUNITY): Payer: Self-pay

## 2014-10-07 ENCOUNTER — Ambulatory Visit (HOSPITAL_COMMUNITY)
Admission: RE | Admit: 2014-10-07 | Discharge: 2014-10-07 | Disposition: A | Discharge status: Home / Self Care | Payer: Medicare Other | Source: Ambulatory Visit | Attending: Gastroenterology | Admitting: Gastroenterology

## 2014-10-07 ENCOUNTER — Ambulatory Visit (HOSPITAL_COMMUNITY): Payer: Medicare Other | Admitting: Anesthesiology

## 2014-10-07 ENCOUNTER — Encounter (HOSPITAL_COMMUNITY)
Admission: RE | Disposition: A | Discharge status: Home / Self Care | Payer: Self-pay | Source: Ambulatory Visit | Attending: Gastroenterology

## 2014-10-07 DIAGNOSIS — I8500 Esophageal varices without bleeding: Secondary | ICD-10-CM | POA: Insufficient documentation

## 2014-10-07 DIAGNOSIS — K3189 Other diseases of stomach and duodenum: Secondary | ICD-10-CM | POA: Diagnosis not present

## 2014-10-07 DIAGNOSIS — I10 Essential (primary) hypertension: Secondary | ICD-10-CM | POA: Insufficient documentation

## 2014-10-07 DIAGNOSIS — E669 Obesity, unspecified: Secondary | ICD-10-CM | POA: Diagnosis not present

## 2014-10-07 DIAGNOSIS — Z6831 Body mass index (BMI) 31.0-31.9, adult: Secondary | ICD-10-CM | POA: Diagnosis not present

## 2014-10-07 DIAGNOSIS — K7030 Alcoholic cirrhosis of liver without ascites: Secondary | ICD-10-CM | POA: Diagnosis not present

## 2014-10-07 DIAGNOSIS — K311 Adult hypertrophic pyloric stenosis: Secondary | ICD-10-CM | POA: Insufficient documentation

## 2014-10-07 DIAGNOSIS — K2940 Chronic atrophic gastritis without bleeding: Secondary | ICD-10-CM | POA: Diagnosis not present

## 2014-10-07 DIAGNOSIS — R609 Edema, unspecified: Secondary | ICD-10-CM | POA: Insufficient documentation

## 2014-10-07 DIAGNOSIS — K2970 Gastritis, unspecified, without bleeding: Secondary | ICD-10-CM

## 2014-10-07 DIAGNOSIS — R05 Cough: Secondary | ICD-10-CM | POA: Diagnosis not present

## 2014-10-07 DIAGNOSIS — Z79899 Other long term (current) drug therapy: Secondary | ICD-10-CM | POA: Diagnosis not present

## 2014-10-07 DIAGNOSIS — I509 Heart failure, unspecified: Secondary | ICD-10-CM | POA: Diagnosis not present

## 2014-10-07 HISTORY — PX: ESOPHAGOGASTRODUODENOSCOPY (EGD) WITH PROPOFOL: SHX5813

## 2014-10-07 SURGERY — ESOPHAGOGASTRODUODENOSCOPY (EGD) WITH PROPOFOL
Anesthesia: Monitor Anesthesia Care

## 2014-10-07 MED ORDER — PROPOFOL 10 MG/ML IV BOLUS
INTRAVENOUS | Status: AC
  Filled 2014-10-07: qty 20

## 2014-10-07 MED ORDER — PROPOFOL INFUSION 10 MG/ML OPTIME
INTRAVENOUS | Status: DC | PRN
  Administered 2014-10-07: 50 ug/kg/min via INTRAVENOUS

## 2014-10-07 MED ORDER — SPIRONOLACTONE 50 MG PO TABS: 50.0000 mg | ORAL_TABLET | Freq: Every day | ORAL | Status: AC

## 2014-10-07 MED ORDER — SODIUM CHLORIDE 0.9 % IV SOLN: INTRAVENOUS | Status: DC

## 2014-10-07 MED ORDER — LACTATED RINGERS IV SOLN
INTRAVENOUS | Status: DC | PRN
  Administered 2014-10-07: 07:00:00 via INTRAVENOUS

## 2014-10-07 MED ORDER — PROPOFOL 500 MG/50ML IV EMUL
INTRAVENOUS | Status: DC | PRN
  Administered 2014-10-07 (×3): 30 mg via INTRAVENOUS

## 2014-10-07 SURGICAL SUPPLY — 15 items

## 2014-10-07 NOTE — Anesthesia Postprocedure Evaluation (Signed)
  Anesthesia Post-op Note  Patient: Keith [name]  Procedure(s) Performed: Procedure(s) (LRB): ESOPHAGOGASTRODUODENOSCOPY (EGD)  BANDING/WITH PROPOFOL (N/A)  Patient Location: PACU  Anesthesia Type: MAC  Level of Consciousness: awake and alert   Airway and Oxygen Therapy: Patient Spontanous Breathing  Post-op Pain: mild  Post-op Assessment: Post-op Vital signs reviewed, Patient's Cardiovascular Status Stable, Respiratory Function Stable, Patent Airway and No signs of Nausea or vomiting  Last Vitals:  Filed Vitals:   10/07/14 0820  BP: 163/65  Pulse: 64  Temp:   Resp: 16    Post-op Vital Signs: stable   Complications: No apparent anesthesia complications

## 2014-10-07 NOTE — Interval H&P Note (Signed)
History and Physical Interval Note:  10/07/2014 7:19 AM  Keith [name]  has presented today for surgery, with the diagnosis of Esophageal varices   The various methods of treatment have been discussed with the patient and family. After consideration of risks, benefits and other options for treatment, the patient has consented to  Procedure(s): ESOPHAGOGASTRODUODENOSCOPY (EGD)  BANDING/WITH PROPOFOL (N/A) as a surgical intervention .  The patient's history has been reviewed, patient examined, no change in status, stable for surgery.  I have reviewed the patient's chart and labs.  Questions were answered to the patient's satisfaction.     Rachael Fee

## 2014-10-07 NOTE — Discharge Instructions (Addendum)
Conscious Sedation, Adult, Care After °Refer to this sheet in the next few weeks. These instructions provide you with information on caring for yourself after your procedure. Your health care provider may also give you more specific instructions. Your treatment has been planned according to current medical practices, but problems sometimes occur. Call your health care provider if you have any problems or questions after your procedure. °WHAT TO EXPECT AFTER THE PROCEDURE  °After your procedure: °· You may feel sleepy, clumsy, and have poor balance for several hours. °· Vomiting may occur if you eat too soon after the procedure. °HOME CARE INSTRUCTIONS °· Do not participate in any activities where you could become injured for at least 24 hours. Do not: °· Drive. °· Swim. °· Ride a bicycle. °· Operate heavy machinery. °· Cook. °· Use power tools. °· Climb ladders. °· Work from a high place. °· Do not make important decisions or sign legal documents until you are improved. °· If you vomit, drink water, juice, or soup when you can drink without vomiting. Make sure you have little or no nausea before eating solid foods. °· Only take over-the-counter or prescription medicines for pain, discomfort, or fever as directed by your health care provider. °· Make sure you and your family fully understand everything about the medicines given to you, including what side effects may occur. °· You should not drink alcohol, take sleeping pills, or take medicines that cause drowsiness for at least 24 hours. °· If you smoke, do not smoke without supervision. °· If you are feeling better, you may resume normal activities 24 hours after you were sedated. °· Keep all appointments with your health care provider. °SEEK MEDICAL CARE IF: °· Your skin is pale or bluish in color. °· You continue to feel nauseous or vomit. °· Your pain is getting worse and is not helped by medicine. °· You have bleeding or swelling. °· You are still sleepy or  feeling clumsy after 24 hours. °SEEK IMMEDIATE MEDICAL CARE IF: °· You develop a rash. °· You have difficulty breathing. °· You develop any type of allergic problem. °· You have a fever. °MAKE SURE YOU: °· Understand these instructions. °· Will watch your condition. °· Will get help right away if you are not doing well or get worse. °Document Released: 11/26/2012 Document Reviewed: 11/26/2012 °ExitCare® Patient Information ©2015 [employer]. This information is not intended to replace advice given to you by your health care provider. Make sure you discuss any questions you have with your health care provider. ° °Esophagogastroduodenoscopy °Care After °Refer to this sheet in the next few weeks. These instructions provide you with information on caring for yourself after your procedure. Your caregiver may also give you more specific instructions. Your treatment has been planned according to current medical practices, but problems sometimes occur. Call your caregiver if you have any problems or questions after your procedure.  °HOME CARE INSTRUCTIONS °· Do not eat or drink anything until the numbing medicine (local anesthetic) has worn off and your gag reflex has returned. You will know that the local anesthetic has worn off when you can swallow comfortably. °· Do not drive for 12 hours after the procedure or as directed by your caregiver. °· Only take medicines as directed by your caregiver. °SEEK MEDICAL CARE IF:  °· You cannot stop coughing. °· You are not urinating at all or less than usual. °SEEK IMMEDIATE MEDICAL CARE IF: °· You have difficulty swallowing. °· You cannot eat or drink. °·   You have worsening throat or chest pain. °· You have dizziness, lightheadedness, or you faint. °· You have nausea or vomiting. °· You have chills. °· You have a fever. °· You have severe abdominal pain. °· You have black, tarry, or bloody stools. °Document Released: 01/23/2012 Document Reviewed: 01/23/2012 °ExitCare® Patient  Information ©2015 [employer]. This information is not intended to replace advice given to you by your health care provider. Make sure you discuss any questions you have with your health care provider. ° °

## 2014-10-07 NOTE — H&P (View-Only) (Signed)
     History of Present Illness:  Patient is a 73-year-old male who we recently saw in the hospital for decompensated ETOH cirrhosis. He is status post banding of large esophageal varices 08/07/14 after presenting with an upper GI bleed. Patient was also treated for encephalopathy and an ileus.   Patient is in today with his daughter, also power of attorney. Patient looks and feels quite well. There have been a couple of instances at home and daughter all patient was hallucinating (saw ants on wall) but for the most part he is been thinking clearly. Having 2-3 bowel movements on lactulose, he is still on Xifaxan. No increasing abdominal girth. He is on low-dose diarrhetics. States he is compliant with low-sodium diet. Most recent renal function 08/09/14 was normal.  Patient complains of dry cough lately. No significant SOB  Current Medications, Allergies, Past Medical History, Past Surgical History, Family History and Social History were reviewed in [facility] electronic medical record.  Physical Exam: General: Pleasant, well developed , black male in no acute distress Head: Normocephalic and atraumatic Eyes:  sclerae anicteric, conjunctiva pink  Ears: Normal auditory acuity Lungs: Clear throughout to auscultation Heart: Regular rate and rhythm Abdomen: Soft, protuberant, non-tender. No masses. Normal bowel sounds Musculoskeletal: Symmetrical with no gross deformities  Extremities: 2-3+ BLE edema.   Neurological: Alert oriented x 4, grossly nonfocal. No asterixis Psychological:  Alert and cooperative. Normal mood and affect  Assessment and Recommendations:  1. Pleasant 73-year-old male recently hospitalized with decompensated alcoholic cirrhosis and variceal bleed. He is status post banding of large varices mid June. Patient needs follow-up EGD at the hospital in the next couple of weeks. The benefits, risks, and potential complications of EGD  were discussed with the patient and he  agrees to proceed. Continue PPI for now.   2. Encephalopathy, resolved. Daughter had a lot of questions about assessing patient's mental status at home. We talked about the mini mental status check. She knows to notify us of behavior changes, altered sleeping patterns, confusion, etc.. Continue Xifaxan and lactulose.  3. Pedal edema. Patient is on a very low dose of diuretics. Following a low-sodium diet. No appreciable ascites on exam today. Daughter gives a history of heart failure. Will check renal function, BNP.  I think there is room to go up on the diabetic as long as renal function checked on a regular basis and this can be monitored by PCP  4. Cough, no significant SOB. He has pedal edema, no appreciable ascites. Will get CXR though I doubt fluid will be present as lungs sound ok.                  

## 2014-10-07 NOTE — Op Note (Signed)
[facility] [address] [place], [address]   ENDOSCOPY PROCEDURE REPORT  PATIENT: Keith [name], Keith [name]  MR#: [id number] BIRTHDATE: 1940-07-09 , 73  yrs. old GENDER: male ENDOSCOPIST: Rachael Fee, MD PROCEDURE DATE:  10/07/2014 PROCEDURE:  EGD w/ biopsy and EGD w/ band ligation of varices ASA CLASS:     Class IV INDICATIONS:  known esophageal varices, s/p bleeding 2-3 months ago, long hosp stay; EGD 07/2014 found large varices that were ligated. MEDICATIONS: Monitored anesthesia care TOPICAL ANESTHETIC: none  DESCRIPTION OF PROCEDURE: After the risks benefits and alternatives of the procedure were thoroughly explained, informed consent was obtained.  The Pentax Gastroscope [id number] endoscope was introduced through the mouth and advanced to the second portion of the duodenum , Without limitations.  The instrument was slowly withdrawn as the mucosa was fully examined.  There were small distal esophageal varices and evidence of previous banding procedure (mucosal scarring).  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  The pyloric channel was slightly strictured but allowed passage of adult gastroscope. The pre-pyloric stomach was circumferentially thickened, somewhat firm appearing.  This was biopsied extensively.  The UGI tract was otherwise normal.  Six variceal ligating bands were placed in good position on the esophageal varices.  Retroflexed views revealed no abnormalities.     The scope was then withdrawn from the patient and the procedure completed.  COMPLICATIONS: There were no immediate complications.  ENDOSCOPIC IMPRESSION: There were small distal esophageal varices and evidence of previous banding procedure (mucosal scarring).  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  The pyloric channel was slightly strictured but allowed passage of adult gastroscope. The pre-pyloric stomach was  circumferentially thickened, somewhat firm appearing.  This was biopsied extensively.  The UGI tract was otherwise normal.  Six variceal ligating bands were placed in good position on the esophageal varices  RECOMMENDATIONS: Follow up in my office in 6-7 weeks, we will contact you to schedule this appt in next 1-2 days.  Please increase your aldactone to  pill, take one pill once daily.  All your other medicines should stay unchanged.  Await final pathology for further recommendations.   eSigned:  Rachael Fee, MD 10/07/2014 8:01 AM

## 2014-10-07 NOTE — Transfer of Care (Signed)
Immediate Anesthesia Transfer of Care Note  Patient: Keith [name]  Procedure(s) Performed: Procedure(s): ESOPHAGOGASTRODUODENOSCOPY (EGD)  BANDING/WITH PROPOFOL (N/A)  Patient Location: PACU  Anesthesia Type:MAC  Level of Consciousness: sedated, patient cooperative and responds to stimulation  Airway & Oxygen Therapy: Patient Spontanous Breathing and Patient connected to nasal cannula oxygen  Post-op Assessment: Report given to RN and Post -op Vital signs reviewed and stable  Post vital signs: Reviewed and stable  Last Vitals:  Filed Vitals:   10/07/14 0630  BP: 128/54  Pulse: 68  Temp: 36.7 C  Resp: 18    Complications: No apparent anesthesia complications

## 2014-10-08 ENCOUNTER — Encounter (HOSPITAL_COMMUNITY): Payer: Self-pay | Admitting: Gastroenterology

## 2014-10-26 ENCOUNTER — Encounter: Payer: Self-pay | Admitting: Cardiovascular Disease

## 2014-10-26 ENCOUNTER — Ambulatory Visit (INDEPENDENT_AMBULATORY_CARE_PROVIDER_SITE_OTHER): Payer: Medicare Other | Admitting: Cardiovascular Disease

## 2014-10-26 VITALS — BP 104/60 | HR 73 | Ht 67.0 in | Wt 205.2 lb

## 2014-10-26 DIAGNOSIS — I5032 Chronic diastolic (congestive) heart failure: Secondary | ICD-10-CM | POA: Diagnosis not present

## 2014-10-26 NOTE — Patient Instructions (Signed)
Medication Instructions:  Your physician recommends that you continue on your current medications as directed. Please refer to the Current Medication list given to you today.   Labwork: None Ordered   Testing/Procedures: None Ordered   Follow-Up: Your physician recommends that you schedule a follow-up appointment in: as needed with Dr. Nahser     

## 2014-10-26 NOTE — Progress Notes (Signed)
Keith [name] Date of Birth  03-08-1940       [facility] Office [address]  [address] [place], [place]  [address]   [place], [place]  [address] [phone]     [phone]   Fax  [phone]    Fax [phone]  Problem List: 1. 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 Hypertension 4. Hyperlipidemia  History of Present Illness:  Keith [name] is a 74 yo with hx of respiratory arrest from a tracheostomy complication.  He had a 30 day monitor and did not have any afib on the monitor.  He has been on norvasc 10 mg a day and has leg edema.  Has not had any recurrent arrhythmias. Used to drink about one time of liquor every day. He's not had anything to drink in the past month.  10/26/2014. Keith [name] well is a 74 year old gentleman with a history of paroxysmal atrial fibrillation in the setting of respiratory failure. Has a history of diastolic dysfunction. He has normal left trigger systolic function.  Was hospitalized 2 months ago with liver cirrhosis, bleeding esophageal varacies.  Has had some leg swelling ( possibly related to liver cirrhosis)     Current Outpatient Prescriptions on File Prior to Visit  Medication Sig Dispense Refill  . albuterol (PROVENTIL HFA;VENTOLIN HFA) 108 (90 BASE) MCG/ACT inhaler Inhale 2 puffs into the lungs every 6 (six) hours as needed for wheezing. 1 Inhaler 2  . carvedilol (COREG) 25 MG tablet TAKE 1 TABLET BY MOUTH TWICE A DAY 30 tablet 0  . EPINEPHrine (EPI-PEN) 0.3 mg/0.3 mL DEVI Inject 0.3 mg into the muscle as needed (for allergic reaction).     . FERREX 150 150 MG capsule Take 150 mg by mouth daily.  5  . fluticasone (FLONASE) 50 MCG/ACT nasal spray Place 1 spray into both nostrils 2 (two) times daily as needed. For nasal congestion.    . folic acid  (FOLVITE) 1 MG tablet Take 1 tablet (1 mg total) by mouth daily.    . furosemide (LASIX) 20 MG tablet Take 20 mg by mouth daily.  0  . lactulose (CHRONULAC) 10 GM/15ML solution Take 30 mLs (20 g total) by mouth 3 (three) times daily. 240 mL 0  . pantoprazole (PROTONIX) 40 MG tablet Take 1 tablet (40 mg total) by mouth daily. 30 tablet 0  . QUEtiapine (SEROQUEL) 25 MG tablet Take 1 tablet (25 mg total) by mouth 2 (two) times daily. 30 tablet 0  . rifaximin (XIFAXAN) 550 MG TABS tablet Take 1 tablet (550 mg total) by mouth 2 (two) times daily. 60 tablet 1  . spironolactone (ALDACTONE) 50 MG tablet Take 1 tablet (50 mg total) by mouth daily. 30 tablet 11  . thiamine 100 MG tablet Take 1 tablet (100 mg total) by mouth daily.     No current facility-administered medications on file prior to visit.    Allergies  Allergen Reactions  . Lisinopril Other (See Comments)    Presented with laryngeal edema - likely caused is lisinopril   . Amlodipine Swelling    Past Medical History  Diagnosis Date  . HTN (hypertension)   . Hypercholesterolemia   . Alcohol abuse 2014    states last drink 07/03/14  . A-fib 05/2012    in the setting of acute medical illness/ respiratory arrest. Not placed  on anticoagulant.   . History of GI bleed ~ 1990    bleeding from ulcer  . History of cardiac arrest 05/2012    due to tracheostomy site bleeding and airway obstruction  . Angioedema 05/2012    with acute resp failure and hypoxia due to Lisinopril  . Respiratory arrest 05/2012    caused by tracheostomy site bleeding and subsequent airway obstruciton.   . Anemia 05/2012, 07/2014.     normocytic 2014, macrocytic 2016  . Cirrhosis 07/2014  . Depression   . Anxiety     Past Surgical History  Procedure Laterality Date  . Tracheostomy  05/22/12    emergent, angioedema  . Tracheostomy tube placement N/A 05/31/2012    Procedure: TRACHEOSTOMY;  Surgeon: Flo Shanks, MD;  Location: [facility] OR;  Service: ENT;  Laterality:  N/A;  . Laryngoscopy N/A 05/31/2012    Procedure: LARYNGOSCOPY;  Surgeon: Flo Shanks, MD;  Location: [facility] OR;  Service: ENT;  Laterality: N/A;  . Laryngoscopy N/A 06/19/2012    Procedure: DIRECT LARYNGOSCOPY AND BRONCHOSCOPY;  Surgeon: [name], MD;  Location: [facility] OR;  Service: ENT;  Laterality: N/A;  . Tracheostomy revision N/A 06/19/2012    Procedure: TRACHEOSTOMY DECANNULATION;  Surgeon: [name], MD;  Location: [facility];  Service: ENT;  Laterality: N/A;  MICROSCOPE/TELESCOPE TRACHEOSTOMY REVISION  . Esophagogastroduodenoscopy N/A 08/07/2014    Procedure: ESOPHAGOGASTRODUODENOSCOPY (EGD);  Surgeon: Rachael Fee, MD;  Location: [facility] ENDOSCOPY;  Service: Endoscopy;  Laterality: N/A;  . Esophagogastroduodenoscopy N/A 08/10/2014    Procedure: ESOPHAGOGASTRODUODENOSCOPY (EGD);  Surgeon: Iva Boop, MD;  Location: [facility] ENDOSCOPY;  Service: Endoscopy;  Laterality: N/A;  bedside   . Flexible sigmoidoscopy N/A 08/10/2014    Procedure: FLEXIBLE SIGMOIDOSCOPY;  Surgeon: Iva Boop, MD;  Location: [facility] ENDOSCOPY;  Service: Endoscopy;  Laterality: N/A;  bedside  . Esophagogastroduodenoscopy (egd) with propofol N/A 10/07/2014    Procedure: ESOPHAGOGASTRODUODENOSCOPY (EGD)  BANDING/WITH PROPOFOL;  Surgeon: Rachael Fee, MD;  Location: [facility] ENDOSCOPY;  Service: Endoscopy;  Laterality: N/A;    History  Smoking status  . Never Smoker   Smokeless tobacco  . Never Used    History  Alcohol Use  . 0.0 oz/week  . 0 Standard drinks or equivalent per week    Comment: drinks daily. in 07/2012 drinking of 16-24 oz vodka daily reported. 09/22/14 none since 07/03/14    Family History  Problem Relation Age of Onset  . Breast cancer Mother   . Stroke Father     Reviw of Systems:  Reviewed in the HPI.  All other systems are negative.  Physical Exam: Blood pressure 104/60, pulse 73, height  (1.702 m), weight 93.078 kg (205 lb 3.2 oz), SpO2 98 %. General: Well developed, well nourished, in no acute  distress.  Head: Normocephalic, atraumatic, sclera non-icteric, mucus membranes are moist,   Neck: Supple. Carotids are 2 + without bruits. No JVD , he has a tracheostomy site that is healing  Lungs: Clear   Heart: RR, normal S1, S2  Abdomen: Soft, non-tender, non-distended with normal bowel sounds.  Msk:  Strength and tone are normal   Extremities: No clubbing or cyanosis.1-2+  edema.  Distal pedal pulses are 2+ and equal    Neuro: CN II - XII intact.  Alert and oriented X 3.   Psych:  Normal   ECG:   Assessment / Plan:    1. Chronic diastolic congestive heart failure Mild, stable, no CHF symptoms. Continue current meds.   Seems to  be doing ok  2. Alcoholic  Cirrhosis -   3.  Esophageal varices. Was admitted this past summer with bleeding varices   Nahser, Deloris Ping, MD  10/27/2014 9:49 AM    [facility] [address] [place],  [address] [place], [place]  [address] Pager [phone] Phone: [phone]; Fax: [phone]   [facility]  [address] [place], [place]  [address] [phone]   Fax [phone]

## 2014-11-12 ENCOUNTER — Ambulatory Visit: Payer: Medicare Other | Admitting: Gastroenterology

## 2014-11-22 ENCOUNTER — Encounter: Payer: Self-pay | Admitting: Diagnostic Neuroimaging

## 2014-11-22 ENCOUNTER — Ambulatory Visit (INDEPENDENT_AMBULATORY_CARE_PROVIDER_SITE_OTHER): Payer: Medicare Other | Admitting: Diagnostic Neuroimaging

## 2014-11-22 VITALS — BP 108/61 | HR 65 | Ht 67.0 in | Wt 214.0 lb

## 2014-11-22 DIAGNOSIS — R279 Unspecified lack of coordination: Secondary | ICD-10-CM

## 2014-11-22 DIAGNOSIS — G252 Other specified forms of tremor: Secondary | ICD-10-CM | POA: Diagnosis not present

## 2014-11-22 DIAGNOSIS — K7030 Alcoholic cirrhosis of liver without ascites: Secondary | ICD-10-CM

## 2014-11-22 DIAGNOSIS — E7220 Disorder of urea cycle metabolism, unspecified: Secondary | ICD-10-CM | POA: Diagnosis not present

## 2014-11-22 DIAGNOSIS — R278 Other lack of coordination: Secondary | ICD-10-CM

## 2014-11-22 NOTE — Patient Instructions (Signed)
Thank you for coming to see [place] at [facility]. I hope we have been able to provide you high quality care today.  You may receive a patient satisfaction survey over the next few weeks. We would appreciate your feedback and comments so that we may continue to improve ourselves and the health of our patients.  - stay active!   ~~~~~~~~~~~~~~~~~~~~~~~~~~~~~~~~~~~~~~~~~~~~~~~~~~~~~~~~~~~~~~~~~  DR. Patricio Popwell'S GUIDE TO HAPPY AND HEALTHY LIVING These are some of my general health and wellness recommendations. Some of them may apply to you better than others. Please use common sense as you try these suggestions and feel free to ask me any questions.   ACTIVITY/FITNESS Mental, social, emotional and physical stimulation are very important for brain and body health. Try learning a new activity (arts, music, language, sports, games).  Keep moving your body to the best of your abilities. You can do this at home, inside or outside, the park, community center, gym or anywhere you like. Consider a physical therapist or personal trainer to get started. Consider the app Sworkit. Fitness trackers such as smart-watches, smart-phones or Fitbits can help as well.   NUTRITION Eat more plants: colorful vegetables, nuts, seeds and berries.  Eat less sugar, salt, preservatives and processed foods.  Avoid toxins such as cigarettes and alcohol.  Drink water when you are thirsty. Warm water with a slice of lemon is an excellent morning drink to start the day.  Consider these websites for more information The Nutrition Source (https://www.henry-hernandez.biz/) Precision Nutrition (WindowBlog.ch)   RELAXATION Consider practicing mindfulness meditation or other relaxation techniques such as deep breathing, prayer, yoga, tai chi, massage. See website mindful.org or the apps Headspace or Calm to help get started.   SLEEP Try to get at least 7-8+  hours sleep per day. Regular exercise and reduced caffeine will help you sleep better. Practice good sleep hygeine techniques. See website sleep.org for more information.   PLANNING Prepare estate planning, living will, healthcare POA documents. Sometimes this is best planned with the help of an attorney. Theconversationproject.org and agingwithdignity.org are excellent resources.

## 2014-11-22 NOTE — Progress Notes (Signed)
[facility]  PATIENT: Keith [name] DOB: 26-Apr-1940  REFERRING CLINICIAN: Radford Pax HISTORY FROM: patient and daughter  REASON FOR VISIT: follow up    HISTORICAL  CHIEF COMPLAINT:  Chief Complaint  Patient presents with  . Tremors    HISTORY OF PRESENT ILLNESS:   UPDATE 11/22/14: Since last visit, doing better. Tremors have reduced. Still on lactulose and xifaxin. Still with issues of insomnia, depression, fatigue.  PRIOR HPI (09/22/14): 74 year old right-handed male here for evaluation of tremors. Patient has significant history of alcohol abuse, ultimately leading to GI bleeding, alcohol withdrawal coma, and critical illness. Patient was intensive care unit at that time in May 2016. Since that time he has been abstinent of alcohol. Patient did have some mild intermittent tremors, which have worsened since July 2016. Last week tremor significantly worsened. Patient was having increasing problems with balance. Patient has been under care of GI specialist, treated for hepatic encephalopathy, hyperammonemia, with Xifaxan and lactulose.    REVIEW OF SYSTEMS: Full 14 system review of systems performed and notable only for as per HPI.   ALLERGIES: Allergies  Allergen Reactions  . Lisinopril Other (See Comments)    Presented with laryngeal edema - likely caused is lisinopril   . Amlodipine Swelling    HOME MEDICATIONS: Outpatient Prescriptions Prior to Visit  Medication Sig Dispense Refill  . albuterol (PROVENTIL HFA;VENTOLIN HFA) 108 (90 BASE) MCG/ACT inhaler Inhale 2 puffs into the lungs every 6 (six) hours as needed for wheezing. 1 Inhaler 2  . carvedilol (COREG) 25 MG tablet TAKE 1 TABLET BY MOUTH TWICE A DAY 30 tablet 0  . EPINEPHrine (EPI-PEN) 0.3 mg/0.3 mL DEVI Inject 0.3 mg into the muscle as needed (for allergic reaction).     . FERREX 150 150 MG capsule Take 150 mg by mouth daily.  5  . fluticasone (FLONASE) 50 MCG/ACT nasal spray Place 1 spray  into both nostrils 2 (two) times daily as needed. For nasal congestion.    . folic acid (FOLVITE) 1 MG tablet Take 1 tablet (1 mg total) by mouth daily.    . furosemide (LASIX) 20 MG tablet Take 20 mg by mouth daily.  0  . lactulose (CHRONULAC) 10 GM/15ML solution Take 30 mLs (20 g total) by mouth 3 (three) times daily. 240 mL 0  . pantoprazole (PROTONIX) 40 MG tablet Take 1 tablet (40 mg total) by mouth daily. 30 tablet 0  . QUEtiapine (SEROQUEL) 25 MG tablet Take 1 tablet (25 mg total) by mouth 2 (two) times daily. 30 tablet 0  . rifaximin (XIFAXAN) 550 MG TABS tablet Take 1 tablet (550 mg total) by mouth 2 (two) times daily. 60 tablet 1  . spironolactone (ALDACTONE) 50 MG tablet Take 1 tablet (50 mg total) by mouth daily. 30 tablet 11  . thiamine 100 MG tablet Take 1 tablet (100 mg total) by mouth daily.     No facility-administered medications prior to visit.    PAST MEDICAL HISTORY: Past Medical History  Diagnosis Date  . HTN (hypertension)   . Hypercholesterolemia   . Alcohol abuse 2014    states last drink 07/03/14  . A-fib (HCC) 05/2012    in the setting of acute medical illness/ respiratory arrest. Not placed on anticoagulant.   . History of GI bleed ~ 1990    bleeding from ulcer  . History of cardiac arrest 05/2012    due to tracheostomy site bleeding and airway obstruction  . Angioedema 05/2012    with acute  resp failure and hypoxia due to Lisinopril  . Respiratory arrest (HCC) 05/2012    caused by tracheostomy site bleeding and subsequent airway obstruciton.   . Anemia 05/2012, 07/2014.     normocytic 2014, macrocytic 2016  . Cirrhosis (HCC) 07/2014  . Depression   . Anxiety     PAST SURGICAL HISTORY: Past Surgical History  Procedure Laterality Date  . Tracheostomy  05/22/12    emergent, angioedema  . Tracheostomy tube placement N/A 05/31/2012    Procedure: TRACHEOSTOMY;  Surgeon: Flo Shanks, MD;  Location: [facility] OR;  Service: ENT;  Laterality: N/A;  . Laryngoscopy N/A  05/31/2012    Procedure: LARYNGOSCOPY;  Surgeon: Flo Shanks, MD;  Location: [facility] OR;  Service: ENT;  Laterality: N/A;  . Laryngoscopy N/A 06/19/2012    Procedure: DIRECT LARYNGOSCOPY AND BRONCHOSCOPY;  Surgeon: [name], MD;  Location: [facility] OR;  Service: ENT;  Laterality: N/A;  . Tracheostomy revision N/A 06/19/2012    Procedure: TRACHEOSTOMY DECANNULATION;  Surgeon: [name], MD;  Location: [facility] OR;  Service: ENT;  Laterality: N/A;  MICROSCOPE/TELESCOPE TRACHEOSTOMY REVISION  . Esophagogastroduodenoscopy N/A 08/07/2014    Procedure: ESOPHAGOGASTRODUODENOSCOPY (EGD);  Surgeon: Rachael Fee, MD;  Location: [facility];  Service: Endoscopy;  Laterality: N/A;  . Esophagogastroduodenoscopy N/A 08/10/2014    Procedure: ESOPHAGOGASTRODUODENOSCOPY (EGD);  Surgeon: Iva Boop, MD;  Location: [facility];  Service: Endoscopy;  Laterality: N/A;  bedside   . Flexible sigmoidoscopy N/A 08/10/2014    Procedure: FLEXIBLE SIGMOIDOSCOPY;  Surgeon: Iva Boop, MD;  Location: [facility] END[facility]COPY;  Service: Endoscopy;  Laterality: N/A;  bedside  . Esophagogastroduodenoscopy (egd) with propofol N/A 10/07/2014    Procedure: ESOPHAGOGASTRODUODENOSCOPY (EGD)  BANDING/WITH PROPOFOL;  Surgeon: Rachael Fee, MD;  Location: [facility]COPY;  Service: Endoscopy;  Laterality: N/A;    FAMILY HISTORY: Family History  Problem Relation Age of Onset  . Breast cancer Mother   . Stroke Father     SOCIAL HISTORY:  Social History   Social History  . Marital Status: Single    Spouse Name: N/A  . Number of Children: 2  . Years of Education: 12   Occupational History  . retired 2008     worked for [employer]   Social History Main Topics  . Smoking status: Never Smoker   . Smokeless tobacco: Never Used  . Alcohol Use: 0.0 oz/week    0 Standard drinks or equivalent per week     Comment: drinks daily. in 07/2012 drinking of 16-24 oz vodka daily reported. 09/22/14 none since 07/03/14  . Drug Use: No  .  Sexual Activity: Not on file   Other Topics Concern  . Not on file   Social History Narrative   Single , lives with daughter in home   Caffeine use - coffee 1-2 Day     PHYSICAL EXAM  GENERAL EXAM/CONSTITUTIONAL: Vitals:  Filed Vitals:   11/22/14 1344  BP: 108/61  Pulse: 65  Height:  (1.702 m)  Weight: 214 lb (97.07 kg)   Body mass index is 33.51 kg/(m^2).  No exam data presentPatient is in no distress; well developed, nourished and groomed; neck is supple  CARDIOVASCULAR:  Examination of carotid arteries is normal; no carotid bruits  Regular rate and rhythm, no murmurs  Examination of peripheral vascular system by observation and palpation is normal  EYES:  Ophthalmoscopic exam of optic discs and posterior segments is normal; no papilledema or hemorrhages  MUSCULOSKELETAL:  Gait, strength, tone, movements noted in  Neurologic exam below  NEUROLOGIC: MENTAL STATUS:  No flowsheet data found.  awake, alert, oriented to person, place and time  recent and remote memory intact  normal attention and concentration  language fluent, comprehension intact, naming intact,   fund of knowledge appropriate  CRANIAL NERVE:   2nd - no papilledema on fundoscopic exam  2nd, 3rd, 4th, 6th - pupils equal and reactive to light, visual fields full to confrontation, extraocular muscles intact, no nystagmus  5th - facial sensation symmetric  7th - facial strength symmetric  8th - hearing intact  9th - palate elevates symmetrically, uvula midline  11th - shoulder shrug symmetric  12th - tongue protrusion midline  MOTOR:   normal bulk; MILD ASTERIXIS IN HANDS; DIFFUSE 4+ strength in the BUE, BLE  SENSORY:   normal and symmetric to light touch, temperature, vibration  COORDINATION:   finger-nose-finger, fine finger movements normal  REFLEXES:   deep tendon reflexes TRACE and symmetric; ABSENT AT ANKLES  GAIT/STATION:   narrow based gait; romberg is  negative    DIAGNOSTIC DATA (LABS, IMAGING, TESTING) - I reviewed patient records, labs, notes, testing and imaging myself where available.  Lab Results  Component Value Date   WBC 5.4 09/24/2014   HGB 8.1 Repeated and verified X2.* 09/24/2014   HCT 24.8 Repeated and verified X2.* 09/24/2014   MCV 84.8 09/24/2014   PLT 156.0 09/24/2014      Component Value Date/Time   NA 139 09/19/2014 1321   K 4.0 09/19/2014 1321   CL 107 09/19/2014 1321   CO2 26 09/19/2014 1321   GLUCOSE 117* 09/19/2014 1321   BUN 21* 09/19/2014 1321   CREATININE 0.87 09/19/2014 1321   CALCIUM 9.1 09/19/2014 1321   PROT 7.3 09/19/2014 1321   ALBUMIN 3.0* 09/19/2014 1321   AST 42* 09/19/2014 1321   ALT 16* 09/19/2014 1321   ALKPHOS 71 09/19/2014 1321   BILITOT 0.9 09/19/2014 1321   GFRNONAA >60 09/19/2014 1321   GFRAA >60 09/19/2014 1321   No results found for: CHOL, HDL, LDLCALC, LDLDIRECT, TRIG, CHOLHDL No results found for: NWGN5A No results found for: VITAMINB12 Lab Results  Component Value Date   TSH 1.678 06/02/2012   AMMONIA  Date Value Ref Range Status  09/22/2014 248* 27 - 102 ug/dL Final  21/30/8657 65* 9 - 35 umol/L Final  08/10/2014 87* 9 - 35 umol/L Final    08/03/14 CT HEAD [I reviewed images myself and agree with interpretation. -VRP]  - No acute intracranial abnormality. Age-appropriate atrophy.    ASSESSMENT AND PLAN  74 y.o. year old male here with significant history of chronic alcohol abuse, liver cirrhosis, hepatic encephalopathy, now with progressive tremors, balance difficulties, asterixis. Most likely represents toxic metabolic etiology related to chronic alcohol abuse and hyperammonemia. Advised patient to follow up with GI clinic regarding management of hyperammonemia.  Dx:  Asterixis  Postural tremor  Hyperammonemia (HCC)  Alcoholic cirrhosis of liver without ascites (HCC)    PLAN: - follow up with GI  Return in about 6 months (around  05/23/2015).    Suanne Marker, MD 11/22/2014, 2:47 PM Certified in Neurology, Neurophysiology and Neuroimaging  [facility] [address] [place], [place] [address] [phone]

## 2014-12-17 ENCOUNTER — Ambulatory Visit: Payer: Self-pay | Admitting: Cardiovascular Disease

## 2014-12-21 ENCOUNTER — Encounter: Payer: Self-pay | Admitting: Cardiovascular Disease

## 2014-12-21 ENCOUNTER — Ambulatory Visit (INDEPENDENT_AMBULATORY_CARE_PROVIDER_SITE_OTHER): Payer: Medicare Other | Admitting: Cardiovascular Disease

## 2014-12-21 VITALS — BP 112/66 | HR 80 | Ht 67.0 in | Wt 214.0 lb

## 2014-12-21 DIAGNOSIS — I5032 Chronic diastolic (congestive) heart failure: Secondary | ICD-10-CM

## 2014-12-21 DIAGNOSIS — I10 Essential (primary) hypertension: Secondary | ICD-10-CM

## 2014-12-21 MED ORDER — CARVEDILOL 12.5 MG PO TABS: 12.5000 mg | ORAL_TABLET | Freq: Two times a day (BID) | ORAL | Status: DC

## 2014-12-21 NOTE — Progress Notes (Signed)
Keith [name] Date of Birth  01-Jan-1941       [facility] Office [address]  [address] [place], [place]  [address]   [place], [place]  [address] [phone]     [phone]   Fax  [phone]    Fax [phone]  Problem List: 1. 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 Hypertension 4. Hyperlipidemia  History of Present Illness:  Keith [name] is a 74 yo with hx of respiratory arrest from a tracheostomy complication.  He had a 30 day monitor and did not have any afib on the monitor.  He has been on norvasc 10 mg a day and has leg edema.  Has not had any recurrent arrhythmias. Used to drink about one time of liquor every day. He's not had anything to drink in the past month.  10/26/2014. Mr. [name] well is a 74 year old gentleman with a history of paroxysmal atrial fibrillation in the setting of respiratory failure. Has a history of diastolic dysfunction. He has normal left ventricular systolic function.  Was hospitalized 2 months ago with liver cirrhosis, bleeding esophageal varacies.  Has had some leg swelling ( possibly related to liver cirrhosis)   Nov. 1, 2016: Keith [name] is seen today with his daughter [name]   His BP has been recorded lower recently  BP has been in the low 100 range.    Current Outpatient Prescriptions on File Prior to Visit  Medication Sig Dispense Refill  . albuterol (PROVENTIL HFA;VENTOLIN HFA) 108 (90 BASE) MCG/ACT inhaler Inhale 2 puffs into the lungs every 6 (six) hours as needed for wheezing. 1 Inhaler 2  . carvedilol (COREG) 25 MG tablet TAKE 1 TABLET BY MOUTH TWICE A DAY 30 tablet 0  . disulfiram (ANTABUSE) 250 MG tablet TAKE 2 TABLETS DAILY WITH FOOD FOR 2 WEEKS THEN DECREASE TO 1 TAB PER DAY  1  . EPINEPHrine (EPI-PEN) 0.3 mg/0.3 mL DEVI Inject 0.3 mg into the  muscle as needed (for allergic reaction).     . FERREX 150 150 MG capsule Take 150 mg by mouth daily.  5  . fluticasone (FLONASE) 50 MCG/ACT nasal spray Place 1 spray into both nostrils 2 (two) times daily as needed. For nasal congestion.    . folic acid (FOLVITE) 1 MG tablet Take 1 tablet (1 mg total) by mouth daily.    . furosemide (LASIX) 20 MG tablet Take 20 mg by mouth daily.  0  . ipratropium (ATROVENT) 0.06 % nasal spray INSTILL 2 SQUIRTS TWICE DAILY AS DIRECTED  1  . lactulose (CHRONULAC) 10 GM/15ML solution Take 30 mLs (20 g total) by mouth 3 (three) times daily. 240 mL 0  . pantoprazole (PROTONIX) 40 MG tablet Take 1 tablet (40 mg total) by mouth daily. 30 tablet 0  . QUEtiapine (SEROQUEL) 25 MG tablet Take 1 tablet (25 mg total) by mouth 2 (two) times daily. 30 tablet 0  . rifaximin (XIFAXAN) 550 MG TABS tablet Take 1 tablet (550 mg total) by mouth 2 (two) times daily. 60 tablet 1  . spironolactone (ALDACTONE) 50 MG tablet Take 1 tablet (50 mg total) by mouth daily. 30 tablet 11  . thiamine 100 MG tablet Take 1 tablet (100 mg total) by mouth daily.     No current facility-administered medications on file prior to visit.    Allergies  Allergen Reactions  . Lisinopril Other (See Comments)    Presented with laryngeal edema - likely caused is lisinopril   . Amlodipine Swelling    Past Medical History  Diagnosis Date  . HTN (hypertension)   . Hypercholesterolemia   . Alcohol abuse 2014    states last drink 07/03/14  . A-fib (HCC) 05/2012    in the setting of acute medical illness/ respiratory arrest. Not placed on anticoagulant.   . History of GI bleed ~ 1990    bleeding from ulcer  . History of cardiac arrest 05/2012    due to tracheostomy site bleeding and airway obstruction  . Angioedema 05/2012    with acute resp failure and hypoxia due to Lisinopril  . Respiratory arrest (HCC) 05/2012    caused by tracheostomy site bleeding and subsequent airway obstruciton.   . Anemia  05/2012, 07/2014.     normocytic 2014, macrocytic 2016  . Cirrhosis (HCC) 07/2014  . Depression   . Anxiety     Past Surgical History  Procedure Laterality Date  . Tracheostomy  05/22/12    emergent, angioedema  . Tracheostomy tube placement N/A 05/31/2012    Procedure: TRACHEOSTOMY;  Surgeon: Flo ShanksKarol Wolicki, MD;  [facility];  Service: ENT;  Laterality: N/A;  . Laryngoscopy N/A 05/31/2012    Procedure: LARYNGOSCOPY;  Surgeon: Flo ShanksKarol Wolicki, MD;  [facility];  Service: ENT;  Laterality: N/A;  . Laryngoscopy N/A 06/19/2012    Procedure: DIRECT LARYNGOSCOPY AND BRONCHOSCOPY;  Surgeon: Melvenia BeamMitchell Gore, MD;  [facility];  Service: ENT;  Laterality: N/A;  . Tracheostomy revision N/A 06/19/2012    Procedure: TRACHEOSTOMY DECANNULATION;  Surgeon: Melvenia BeamMitchell Gore, MD;  [facility];  Service: ENT;  Laterality: N/A;  MICROSCOPE/TELESCOPE TRACHEOSTOMY REVISION  . Esophagogastroduodenoscopy N/A 08/07/2014    Procedure: ESOPHAGOGASTRODUODENOSCOPY (EGD);  Surgeon: Rachael Feeaniel P Jacobs, MD;  [facility];  Service: Endoscopy;  Laterality: N/A;  . Esophagogastroduodenoscopy N/A 08/10/2014    Procedure: ESOPHAGOGASTRODUODENOSCOPY (EGD);  Surgeon: Iva Booparl E Gessner, MD;  [facility];  Service: Endoscopy;  Laterality: N/A;  bedside   . Flexible sigmoidoscopy N/A 08/10/2014    Procedure: FLEXIBLE SIGMOIDOSCOPY;  Surgeon: Iva Booparl E Gessner, MD;  Location: [facility];  Service: Endoscopy;  Laterality: N/A;  bedside  . Esophagogastroduodenoscopy (egd) with propofol N/A 10/07/2014    Procedure: ESOPHAGOGASTRODUODENOSCOPY (EGD)  BANDING/WITH PROPOFOL;  Surgeon: Rachael Feeaniel P Jacobs, MD;  Location: [facility];  Service: Endoscopy;  Laterality: N/A;    History  Smoking status  . Never Smoker   Smokeless tobacco  . Never Used    History  Alcohol Use  . 0.0 oz/week  . 0 Standard drinks or equivalent per week    Comment: drinks daily. in 07/2012 drinking of 16-24 oz vodka daily reported. 09/22/14 none  since 07/03/14    Family History  Problem Relation Age of Onset  . Breast cancer Mother   . Stroke Father     Reviw of Systems:  Reviewed in the HPI.  All other systems are negative.  Physical Exam: Blood pressure 112/66, pulse 80, height 5\' 7"  (1.702 m), weight 214 lb (97.07 kg), SpO2 97 %. General: Well developed, well nourished, in no acute distress.  Head: Normocephalic, atraumatic, sclera non-icteric, mucus membranes are moist,   Neck: Supple. Carotids are 2 + without bruits. No JVD , he has a tracheostomy site that is healing  Lungs: Clear   Heart: RR, normal S1, S2  Abdomen: Soft, non-tender, non-distended with normal bowel sounds.  Msk:  Strength and tone are normal   Extremities: No clubbing or cyanosis.1-2+  edema.  Distal pedal pulses are 2+ and equal    Neuro: CN II - XII intact.  Alert and oriented X 3.   Psych:  Normal   ECG:   Assessment / Plan:    1. Chronic diastolic congestive heart failure Mild, stable, no CHF symptoms. Continue current meds.   Seems to be doing ok  2. Alcoholic  Cirrhosis -   3.  Esophageal varices. Was admitted this past summer with bleeding varices   4. History of hypertension: His blood pressure has been relatively low recently. The nurses at the nursing home have been calling the daughter almost every day because of blood pressure readings in the low 100 range. While he is  Completely asymptomatic, I do think that he may feel better with a slightly higher blood pressure so we will decrease the  Carvedilol to 12.5 mg twice a day.    I don't think that this will negatively impact his cirrhosis /portal hypertension.   we'll follow up on an as-needed basis.  Nahser, Deloris Ping, MD  12/21/2014 4:50 PM    [facility] [address] [place],  [address] [place], [place]  [address] Pager [phone] Phone: [phone]; Fax: [phone]   [facility]  [address] [place], [place]   [address] [phone]   Fax [phone]

## 2014-12-21 NOTE — Patient Instructions (Signed)
Medication Instructions:  DECREASE Coreg (Carvedilol) to 12.5 mg twice daily   Labwork: None Ordered   Testing/Procedures: None Ordered   Follow-Up: Your physician recommends that you schedule a follow-up appointment in: as needed with Dr. Elease HashimotoNahser.    If you need a refill on your cardiac medications before your next appointment, please call your pharmacy.   Thank you for choosing [facility]! Eligha BridegroomMichelle Swinyer, RN [phone]

## 2015-01-11 ENCOUNTER — Encounter: Payer: Self-pay | Admitting: Gastroenterology

## 2015-01-11 ENCOUNTER — Other Ambulatory Visit (INDEPENDENT_AMBULATORY_CARE_PROVIDER_SITE_OTHER): Payer: Self-pay

## 2015-01-11 ENCOUNTER — Ambulatory Visit (INDEPENDENT_AMBULATORY_CARE_PROVIDER_SITE_OTHER): Payer: Medicare Other | Admitting: Gastroenterology

## 2015-01-11 VITALS — BP 120/62 | HR 60 | Ht 65.5 in | Wt 215.8 lb

## 2015-01-11 DIAGNOSIS — K7460 Unspecified cirrhosis of liver: Secondary | ICD-10-CM

## 2015-01-11 LAB — COMPREHENSIVE METABOLIC PANEL
ALBUMIN: 4 g/dL (ref 3.5–5.2)
ALT: 16 U/L (ref 0–53)
AST: 38 U/L — ABNORMAL HIGH (ref 0–37)
Alkaline Phosphatase: 105 U/L (ref 39–117)
BUN: 15 mg/dL (ref 6–23)
CALCIUM: 10.3 mg/dL (ref 8.4–10.5)
CHLORIDE: 101 meq/L (ref 96–112)
CO2: 30 meq/L (ref 19–32)
CREATININE: 0.89 mg/dL (ref 0.40–1.50)
GFR: 107.7 mL/min (ref >60.00)
Glucose, Bld: 125 mg/dL — ABNORMAL HIGH (ref 70–99)
Potassium: 5.4 mEq/L — ABNORMAL HIGH (ref 3.5–5.1)
Sodium: 137 mEq/L (ref 135–145)
Total Bilirubin: 1.2 mg/dL (ref 0.2–1.2)
Total Protein: 8.7 g/dL — ABNORMAL HIGH (ref 6.0–8.3)

## 2015-01-11 LAB — CBC WITH DIFFERENTIAL/PLATELET
BASOS PCT: 0.3 % (ref 0.0–3.0)
Basophils Absolute: 0 10*3/uL (ref 0.0–0.1)
EOS PCT: 1.1 % (ref 0.0–5.0)
Eosinophils Absolute: 0.1 10*3/uL (ref 0.0–0.7)
HEMATOCRIT: 29.4 % — AB (ref 39.0–52.0)
HEMOGLOBIN: 9.4 g/dL — AB (ref 13.0–17.0)
LYMPHS PCT: 16.5 % (ref 12.0–46.0)
Lymphs Abs: 1.2 10*3/uL (ref 0.7–4.0)
MCHC: 31.9 g/dL (ref 30.0–36.0)
MCV: 79.8 fl (ref 78.0–100.0)
MONOS PCT: 6.5 % (ref 3.0–12.0)
Monocytes Absolute: 0.5 10*3/uL (ref 0.1–1.0)
NEUTROS ABS: 5.7 10*3/uL (ref 1.4–7.7)
Neutrophils Relative %: 75.6 % (ref 43.0–77.0)
PLATELETS: 169 10*3/uL (ref 150.0–400.0)
RBC: 3.69 Mil/uL — ABNORMAL LOW (ref 4.22–5.81)
RDW: 20.2 % — AB (ref 11.5–15.5)
WBC: 7.5 10*3/uL (ref 4.0–10.5)

## 2015-01-11 LAB — PROTIME-INR
INR: 1.4 ratio — AB (ref 0.8–1.0)
PROTHROMBIN TIME: 15 s — AB (ref 9.6–13.1)

## 2015-01-11 MED ORDER — LACTULOSE 10 GM/15ML PO SOLN: 20.0000 g | Freq: Two times a day (BID) | ORAL | Status: DC

## 2015-01-11 NOTE — Patient Instructions (Addendum)
Cut back on your lactulose to twice daily dosing for now. You will be set up for an ultrasound for hepatoma screening, ? Ascites.  [facility] on 01/17/15 at 11:30 am. Please arrive 15 minutes prior to your appointment for registration. Make certain not to have anything to eat or drink 6 hours prior to your appointment. Should you need to reschedule your appointment, please contact radiology at [phone]. This test typically takes about 30 minutes to perform. You will have labs checked today in the basement lab.  Please head down after you check out with the front desk  (cbc, cmet, inr). Please return to see Dr. Christella HartiganJacobs in 2 months, sooner if needed.

## 2015-01-11 NOTE — Progress Notes (Signed)
Review of pertinent gastrointestinal problems: 1. Etoh cirrhosis  Bleeding esophageal varices: EGD 07/2014 Dr. Christella Hartigan during UGI bleeding; banded large varices;  Repeat EGD 09/2014 Dr. Christella Hartigan banded small varices.   Recent liver imaging CT scan 07/2014 showed cirrhosis, portal hypertensive changes,   Small ascites on imaging 07/2014  HPI: This is a  very pleasant 74 year old man  Chief complaint is a proximal cirrhosis  Weight 08/2014 was 201  Currently on lasix 20, aldactone 50  Doing well.  Last drink was in June 2016.  Daughter is with him, agrees his MS is clear.  On lactulose and xifaxin   Past Medical History  Diagnosis Date  . HTN (hypertension)   . Hypercholesterolemia   . Alcohol abuse 2014    states last drink 07/03/14  . A-fib (HCC) 05/2012    in the setting of acute medical illness/ respiratory arrest. Not placed on anticoagulant.   . History of GI bleed ~ 1990    bleeding from ulcer  . History of cardiac arrest 05/2012    due to tracheostomy site bleeding and airway obstruction  . Angioedema 05/2012    with acute resp failure and hypoxia due to Lisinopril  . Respiratory arrest (HCC) 05/2012    caused by tracheostomy site bleeding and subsequent airway obstruciton.   . Anemia 05/2012, 07/2014.     normocytic 2014, macrocytic 2016  . Cirrhosis (HCC) 07/2014  . Depression   . Anxiety     Past Surgical History  Procedure Laterality Date  . Tracheostomy  05/22/12    emergent, angioedema  . Tracheostomy tube placement N/A 05/31/2012    Procedure: TRACHEOSTOMY;  Surgeon: Flo Shanks, MD;  Location: [facility] OR;  Service: ENT;  Laterality: N/A;  . Laryngoscopy N/A 05/31/2012    Procedure: LARYNGOSCOPY;  Surgeon: Flo Shanks, MD;  Location: [facility] OR;  Service: ENT;  Laterality: N/A;  . Laryngoscopy N/A 06/19/2012    Procedure: DIRECT LARYNGOSCOPY AND BRONCHOSCOPY;  Surgeon: [name], MD;  Location: [facility] OR;  Service: ENT;  Laterality: N/A;  . Tracheostomy revision N/A  06/19/2012    Procedure: TRACHEOSTOMY DECANNULATION;  Surgeon: [name], MD;  Location: [facility] OR;  Service: ENT;  Laterality: N/A;  MICROSCOPE/TELESCOPE TRACHEOSTOMY REVISION  . Esophagogastroduodenoscopy N/A 08/07/2014    Procedure: ESOPHAGOGASTRODUODENOSCOPY (EGD);  Surgeon: Rachael Fee, MD;  Location: [facility];  Service: Endoscopy;  Laterality: N/A;  . Esophagogastroduodenoscopy N/A 08/10/2014    Procedure: ESOPHAGOGASTRODUODENOSCOPY (EGD);  Surgeon: Iva Boop, MD;  Location: [facility];  Service: Endoscopy;  Laterality: N/A;  bedside   . Flexible sigmoidoscopy N/A 08/10/2014    Procedure: FLEXIBLE SIGMOIDOSCOPY;  Surgeon: Iva Boop, MD;  Location: [facility] ENDOSCOPY;  Service: Endoscopy;  Laterality: N/A;  bedside  . Esophagogastroduodenoscopy (egd) with propofol N/A 10/07/2014    Procedure: ESOPHAGOGASTRODUODENOSCOPY (EGD)  BANDING/WITH PROPOFOL;  Surgeon: Rachael Fee, MD;  Location: [facility] ENDOSCOPY;  Service: Endoscopy;  Laterality: N/A;    Current Outpatient Prescriptions  Medication Sig Dispense Refill  . albuterol (PROVENTIL HFA;VENTOLIN HFA) 108 (90 BASE) MCG/ACT inhaler Inhale 2 puffs into the lungs every 6 (six) hours as needed for wheezing. 1 Inhaler 2  . carvedilol (COREG) 12.5 MG tablet Take 1 tablet (12.5 mg total) by mouth 2 (two) times daily. 60 tablet 11  . disulfiram (ANTABUSE) 250 MG tablet TAKE 2 TABLETS DAILY WITH FOOD FOR 2 WEEKS THEN DECREASE TO 1 TAB PER DAY  1  . EPINEPHrine (EPI-PEN) 0.3 mg/0.3 mL DEVI Inject 0.3 mg into the  muscle as needed (for allergic reaction).     . FERREX 150 150 MG capsule Take 150 mg by mouth daily.  5  . fluticasone (FLONASE) 50 MCG/ACT nasal spray Place 1 spray into both nostrils 2 (two) times daily as needed. For nasal congestion.    . folic acid (FOLVITE) 1 MG tablet Take 1 tablet (1 mg total) by mouth daily.    . furosemide (LASIX) 20 MG tablet Take 20 mg by mouth daily.  0  . ipratropium (ATROVENT) 0.06 % nasal spray INSTILL  2 SQUIRTS TWICE DAILY AS DIRECTED  1  . lactulose (CHRONULAC) 10 GM/15ML solution Take 30 mLs (20 g total) by mouth 3 (three) times daily. 240 mL 0  . pantoprazole (PROTONIX) 40 MG tablet Take 1 tablet (40 mg total) by mouth daily. 30 tablet 0  . QUEtiapine (SEROQUEL) 25 MG tablet Take 1 tablet (25 mg total) by mouth 2 (two) times daily. 30 tablet 0  . rifaximin (XIFAXAN) 550 MG TABS tablet Take 1 tablet (550 mg total) by mouth 2 (two) times daily. 60 tablet 1  . spironolactone (ALDACTONE) 50 MG tablet Take 1 tablet (50 mg total) by mouth daily. 30 tablet 11  . thiamine 100 MG tablet Take 1 tablet (100 mg total) by mouth daily.     No current facility-administered medications for this visit.    Allergies as of 01/11/2015 - Review Complete 12/21/2014  Allergen Reaction Noted  . Lisinopril Other (See Comments) 05/22/2012  . Amlodipine Swelling 07/24/2012    Family History  Problem Relation Age of Onset  . Breast cancer Mother   . Stroke Father     Social History   Social History  . Marital Status: Single    Spouse Name: N/A  . Number of Children: 2  . Years of Education: 12   Occupational History  . retired 2008     worked for [employer]   Social History Main Topics  . Smoking status: Never Smoker   . Smokeless tobacco: Never Used  . Alcohol Use: 0.0 oz/week    0 Standard drinks or equivalent per week     Comment: drinks daily. in 07/2012 drinking of 16-24 oz vodka daily reported. 09/22/14 none since 07/03/14  . Drug Use: No  . Sexual Activity: Not on file   Other Topics Concern  . Not on file   Social History Narrative   Single , lives with daughter in home   Caffeine use - coffee 1-2 Day     Physical Exam: BP 120/62 mmHg  Pulse 60  Ht 5' 5.5" (1.664 m)  Wt 215 lb 12.8 oz (97.886 kg)  BMI 35.35 kg/m2 Constitutional: generally well-appearing Psychiatric: alert and oriented x3 Abdomen: soft, nontender, nondistended, no obvious ascites, no peritoneal  signs, normal bowel sounds Trace lower extremity edema bilaterally  Assessment and plan: 74 y.o. 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 His mental status has really improved. He is taking lactulose 3 times a day and Xifaxan twice daily. He usually gets up in the middle the night to have loose stool and is a bit bothered by that. Can have him back down one dose of the lactulose and they know to keep a watchful eye on his mental status call if it declines at all. He has trace lower extremity edema and I can't really tell if he has ascites on examination. Certainly he has gained some weight since  his last visit or months ago. It may just be that he is putting on some fat because he is eating better. He is about due for hepatoma screening with abdominal imaging and so he will get an abdominal ultrasound. If that shows significant ascites and if his labs show that his kidneys will tolerate I will likely increase his diuretics. He'll return to see me in 2 months and sooner if needed.   Rob Buntinganiel Treshawn Allen, MD [facility] 01/11/2015, 4:09 PM

## 2015-01-12 ENCOUNTER — Other Ambulatory Visit: Payer: Self-pay

## 2015-01-12 DIAGNOSIS — K7460 Unspecified cirrhosis of liver: Secondary | ICD-10-CM

## 2015-01-17 ENCOUNTER — Ambulatory Visit (HOSPITAL_COMMUNITY): Payer: Medicare Other

## 2015-01-20 ENCOUNTER — Ambulatory Visit (HOSPITAL_COMMUNITY): Payer: Medicare Other

## 2015-01-21 ENCOUNTER — Ambulatory Visit (HOSPITAL_COMMUNITY)
Admission: RE | Admit: 2015-01-21 | Discharge: 2015-01-21 | Disposition: A | Discharge status: Home / Self Care | Payer: Medicare Other | Source: Ambulatory Visit | Attending: Gastroenterology | Admitting: Gastroenterology

## 2015-01-21 DIAGNOSIS — N281 Cyst of kidney, acquired: Secondary | ICD-10-CM | POA: Insufficient documentation

## 2015-01-21 DIAGNOSIS — K7460 Unspecified cirrhosis of liver: Secondary | ICD-10-CM | POA: Diagnosis not present

## 2015-01-25 ENCOUNTER — Other Ambulatory Visit (HOSPITAL_COMMUNITY): Payer: Medicare Other

## 2015-02-08 ENCOUNTER — Other Ambulatory Visit: Payer: Self-pay | Admitting: Physical Medicine and Rehabilitation

## 2015-02-08 DIAGNOSIS — M542 Cervicalgia: Secondary | ICD-10-CM

## 2015-02-10 ENCOUNTER — Other Ambulatory Visit: Payer: Self-pay

## 2015-02-16 ENCOUNTER — Emergency Department (HOSPITAL_COMMUNITY): Payer: Medicare Other

## 2015-02-16 ENCOUNTER — Telehealth: Payer: Self-pay | Admitting: Gastroenterology

## 2015-02-16 ENCOUNTER — Emergency Department (HOSPITAL_COMMUNITY)
Admission: EM | Admit: 2015-02-16 | Discharge: 2015-02-16 | Disposition: A | Discharge status: Home / Self Care | Payer: Medicare Other | Attending: Emergency Medicine | Admitting: Emergency Medicine

## 2015-02-16 ENCOUNTER — Encounter (HOSPITAL_COMMUNITY): Payer: Self-pay | Admitting: Emergency Medicine

## 2015-02-16 DIAGNOSIS — Z8719 Personal history of other diseases of the digestive system: Secondary | ICD-10-CM | POA: Diagnosis not present

## 2015-02-16 DIAGNOSIS — I10 Essential (primary) hypertension: Secondary | ICD-10-CM | POA: Insufficient documentation

## 2015-02-16 DIAGNOSIS — Z792 Long term (current) use of antibiotics: Secondary | ICD-10-CM | POA: Diagnosis not present

## 2015-02-16 DIAGNOSIS — F419 Anxiety disorder, unspecified: Secondary | ICD-10-CM | POA: Diagnosis not present

## 2015-02-16 DIAGNOSIS — I4891 Unspecified atrial fibrillation: Secondary | ICD-10-CM | POA: Diagnosis not present

## 2015-02-16 DIAGNOSIS — R4182 Altered mental status, unspecified: Secondary | ICD-10-CM | POA: Insufficient documentation

## 2015-02-16 DIAGNOSIS — Z79899 Other long term (current) drug therapy: Secondary | ICD-10-CM | POA: Diagnosis not present

## 2015-02-16 DIAGNOSIS — F329 Major depressive disorder, single episode, unspecified: Secondary | ICD-10-CM | POA: Diagnosis not present

## 2015-02-16 DIAGNOSIS — D649 Anemia, unspecified: Secondary | ICD-10-CM | POA: Diagnosis not present

## 2015-02-16 DIAGNOSIS — Z8639 Personal history of other endocrine, nutritional and metabolic disease: Secondary | ICD-10-CM | POA: Insufficient documentation

## 2015-02-16 LAB — CBC WITH DIFFERENTIAL/PLATELET
BASOS PCT: 0 %
Basophils Absolute: 0 10*3/uL (ref 0.0–0.1)
Eosinophils Absolute: 0.1 10*3/uL (ref 0.0–0.7)
Eosinophils Relative: 2 %
HEMATOCRIT: 32.6 % — AB (ref 39.0–52.0)
Hemoglobin: 10.3 g/dL — ABNORMAL LOW (ref 13.0–17.0)
LYMPHS ABS: 1.9 10*3/uL (ref 0.7–4.0)
LYMPHS PCT: 28 %
MCH: 25.9 pg — ABNORMAL LOW (ref 26.0–34.0)
MCHC: 31.6 g/dL (ref 30.0–36.0)
MCV: 82.1 fL (ref 78.0–100.0)
MONOS PCT: 13 %
Monocytes Absolute: 0.9 10*3/uL (ref 0.1–1.0)
NEUTROS PCT: 57 %
Neutro Abs: 3.9 10*3/uL (ref 1.7–7.7)
PLATELETS: 129 10*3/uL — AB (ref 150–400)
RBC: 3.97 MIL/uL — ABNORMAL LOW (ref 4.22–5.81)
RDW: 18.9 % — AB (ref 11.5–15.5)
WBC: 6.8 10*3/uL (ref 4.0–10.5)

## 2015-02-16 LAB — COMPREHENSIVE METABOLIC PANEL
ALT: 30 U/L (ref 17–63)
AST: 46 U/L — AB (ref 15–41)
Albumin: 3.2 g/dL — ABNORMAL LOW (ref 3.5–5.0)
Alkaline Phosphatase: 134 U/L — ABNORMAL HIGH (ref 38–126)
Anion gap: 9 (ref 5–15)
BUN: 14 mg/dL (ref 6–20)
CHLORIDE: 105 mmol/L (ref 101–111)
CO2: 23 mmol/L (ref 22–32)
CREATININE: 0.65 mg/dL (ref 0.61–1.24)
Calcium: 9.2 mg/dL (ref 8.9–10.3)
GFR calc non Af Amer: >60 mL/min (ref >60)
Glucose, Bld: 326 mg/dL — ABNORMAL HIGH (ref 65–99)
POTASSIUM: 4.8 mmol/L (ref 3.5–5.1)
SODIUM: 137 mmol/L (ref 135–145)
Total Bilirubin: 0.9 mg/dL (ref 0.3–1.2)
Total Protein: 7.4 g/dL (ref 6.5–8.1)

## 2015-02-16 LAB — URINE MICROSCOPIC-ADD ON: RBC / HPF: NONE SEEN RBC/hpf (ref 0–5)

## 2015-02-16 LAB — URINALYSIS, ROUTINE W REFLEX MICROSCOPIC
Bilirubin Urine: NEGATIVE
Hgb urine dipstick: NEGATIVE
Ketones, ur: NEGATIVE mg/dL
Leukocytes, UA: NEGATIVE
Nitrite: NEGATIVE
PROTEIN: NEGATIVE mg/dL
Specific Gravity, Urine: 1.041 — ABNORMAL HIGH (ref 1.005–1.030)
pH: 5 (ref 5.0–8.0)

## 2015-02-16 LAB — LIPASE, BLOOD: LIPASE: 57 U/L — AB (ref 11–51)

## 2015-02-16 LAB — CBG MONITORING, ED: GLUCOSE-CAPILLARY: 297 mg/dL — AB (ref 65–99)

## 2015-02-16 LAB — I-STAT TROPONIN, ED: TROPONIN I, POC: 0 ng/mL (ref 0.00–0.08)

## 2015-02-16 LAB — AMMONIA: Ammonia: 35 umol/L (ref 9–35)

## 2015-02-16 MED ORDER — SODIUM CHLORIDE 0.9 % IV BOLUS (SEPSIS)
1000.0000 mL | Freq: Once | INTRAVENOUS | Status: AC
  Administered 2015-02-16: 1000 mL via INTRAVENOUS

## 2015-02-16 MED ORDER — SODIUM CHLORIDE 0.9 % IV BOLUS (SEPSIS)
500.0000 mL | Freq: Once | INTRAVENOUS | Status: AC
  Administered 2015-02-16: 500 mL via INTRAVENOUS

## 2015-02-16 NOTE — Discharge Instructions (Signed)
DASH Eating Plan  DASH stands for "Dietary Approaches to Stop Hypertension." The DASH eating plan is a healthy eating plan that has been shown to reduce high blood pressure (hypertension). Additional health benefits may include reducing the risk of type 2 diabetes mellitus, heart disease, and stroke. The DASH eating plan may also help with weight loss.  WHAT DO I NEED TO KNOW ABOUT THE DASH EATING PLAN?  For the DASH eating plan, you will follow these general guidelines:  Choose foods with a percent daily value for sodium of less than 5% (as listed on the food label).  Use salt-free seasonings or herbs instead of table salt or sea salt.  Check with your health care provider or pharmacist before using salt substitutes.  Eat lower-sodium products, often labeled as "lower sodium" or "no salt added."  Eat fresh foods.  Eat more vegetables, fruits, and low-fat dairy products.  Choose whole grains. Look for the word "whole" as the first word in the ingredient list.  Choose fish and skinless chicken or [place]key more often than red meat. Limit fish, poultry, and meat to 6 oz (170 g) each day.  Limit sweets, desserts, sugars, and sugary drinks.  Choose heart-healthy fats.  Limit cheese to 1 oz (28 g) per day.  Eat more home-cooked food and less restaurant, buffet, and fast food.  Limit fried foods.  Cook foods using methods other than frying.  Limit canned vegetables. If you do use them, rinse them well to decrease the sodium.  When eating at a restaurant, ask that your food be prepared with less salt, or no salt if possible. WHAT FOODS CAN I EAT?  Seek help from a dietitian for individual calorie needs.  Grains  Whole grain or whole wheat bread. Brown rice. Whole grain or whole wheat pasta. Quinoa, bulgur, and whole grain cereals. Low-sodium cereals. Corn or whole wheat flour tortillas. Whole grain cornbread. Whole grain crackers. Low-sodium crackers.  Vegetables  Fresh or frozen vegetables (raw,  steamed, roasted, or grilled). Low-sodium or reduced-sodium tomato and vegetable juices. Low-sodium or reduced-sodium tomato sauce and paste. Low-sodium or reduced-sodium canned vegetables.  Fruits  All fresh, canned (in natural juice), or frozen fruits.  Meat and Other Protein Products  Ground beef (85% or leaner), grass-fed beef, or beef trimmed of fat. Skinless chicken or [place]key. Ground chicken or [place]key. Pork trimmed of fat. All fish and seafood. Eggs. Dried beans, peas, or lentils. Unsalted nuts and seeds. Unsalted canned beans.  Dairy  Low-fat dairy products, such as skim or 1% milk, 2% or reduced-fat cheeses, low-fat ricotta or cottage cheese, or plain low-fat yogurt. Low-sodium or reduced-sodium cheeses.  Fats and Oils  Tub margarines without trans fats. Light or reduced-fat mayonnaise and salad dressings (reduced sodium). Avocado. Safflower, olive, or canola oils. Natural peanut or almond butter.  Other  Unsalted popcorn and pretzels.  The items listed above may not be a complete list of recommended foods or beverages. Contact your dietitian for more options.  WHAT FOODS ARE NOT RECOMMENDED?  Grains  White bread. White pasta. White rice. Refined cornbread. Bagels and croissants. Crackers that contain trans fat.  Vegetables  Creamed or fried vegetables. Vegetables in a cheese sauce. Regular canned vegetables. Regular canned tomato sauce and paste. Regular tomato and vegetable juices.  Fruits  Dried fruits. Canned fruit in light or heavy syrup. Fruit juice.  Meat and Other Protein Products  Fatty cuts of meat. Ribs, chicken wings, bacon, sausage, bologna, salami, chitterlings, fatback, hot dogs, bratwurst,  and packaged luncheon meats. Salted nuts and seeds. Canned beans with salt.  Dairy  Whole or 2% milk, cream, half-and-half, and cream cheese. Whole-fat or sweetened yogurt. Full-fat cheeses or blue cheese. Nondairy creamers and whipped toppings. Processed cheese, cheese spreads, or  cheese curds.  Condiments  Onion and garlic salt, seasoned salt, table salt, and sea salt. Canned and packaged gravies. Worcestershire sauce. Tartar sauce. Barbecue sauce. Teriyaki sauce. Soy sauce, including reduced sodium. Steak sauce. Fish sauce. Oyster sauce. Cocktail sauce. Horseradish. Ketchup and mustard. Meat flavorings and tenderizers. Bouillon cubes. Hot sauce. Tabasco sauce. Marinades. Taco seasonings. Relishes.  Fats and Oils  Butter, stick margarine, lard, shortening, ghee, and bacon fat. Coconut, palm kernel, or palm oils. Regular salad dressings.  Other  Pickles and olives. Salted popcorn and pretzels.  The items listed above may not be a complete list of foods and beverages to avoid. Contact your dietitian for more information.  WHERE CAN I FIND MORE INFORMATION?  National Heart, Lung, and Blood Institute: [employer]  This information is not intended to replace advice given to you by your health care provider. Make sure you discuss any questions you have with your health care provider.  Document Released: 01/25/2011 Document Revised: 02/26/2014 Document Reviewed: 12/10/2012  Elsevier Interactive Patient Education 2016 [employer].    Confusion Confusion is the inability to think with your usual speed or clarity. Confusion may come on quickly or slowly over time. How quickly the confusion comes on depends on the cause. Confusion can be due to any number of causes. CAUSES   Concussion, head injury, or head trauma.  Seizures.  Stroke.  Fever.  Brain tumor.  Age related decreased brain function (dementia).  Heightened emotional states like rage or terror.  Mental illness in which the person loses the ability to determine what is real and what is not (hallucinations).  Infections such as a urinary tract infection (UTI).  Toxic effects from alcohol, drugs, or prescription medicines.  Dehydration and an imbalance of salts in  the body (electrolytes).  Lack of sleep.  Low blood sugar (diabetes).  Low levels of oxygen from conditions such as chronic lung disorders.  Drug interactions or other medicine side effects.  Nutritional deficiencies, especially niacin, thiamine, vitamin C, or vitamin B.  Sudden drop in body temperature (hypothermia).  Change in routine, such as when traveling or hospitalized. SIGNS AND SYMPTOMS  People often describe their thinking as cloudy or unclear when they are confused. Confusion can also include feeling disoriented. That means you are unaware of where or who you are. You may also not know what the date or time is. If confused, you may also have difficulty paying attention, remembering, and making decisions. Some people also act aggressively when they are confused.  DIAGNOSIS  The medical evaluation of confusion may include:  Blood and urine tests.  X-rays.  Brain and nervous system tests.  Analyzing your brain waves (electroencephalogram or EEG).  Magnetic resonance imaging (MRI) of your head.  Computed tomography (CT) scan of your head.  Mental status tests in which your health care provider may ask many questions. Some of these questions may seem silly or strange, but they are a very important test to help diagnose and treat confusion. TREATMENT  An admission to the hospital may not be needed, but a person with confusion should not be left alone. Stay with a family member or friend until the confusion clears. Avoid alcohol, pain relievers, or sedative drugs until you have fully recovered.  Do not drive until directed by your health care provider. HOME CARE INSTRUCTIONS  What family and friends can do:  To find out if someone is confused, ask the person to state his or her name, age, and the date. If the person is unsure or answers incorrectly, he or she is confused.  Always introduce yourself, no matter how well the person knows you.  Often remind the person of his  or her location.  Place a calendar and clock near the confused person.  Help the person with his or her medicines. You may want to use a pill box, an alarm as a reminder, or give the person each dose as prescribed.  Talk about current events and plans for the day.  Try to keep the environment calm, quiet, and peaceful.  Make sure the person keeps follow-up visits with his or her health care provider. PREVENTION  Ways to prevent confusion:  Avoid alcohol.  Eat a balanced diet.  Get enough sleep.  Take medicine only as directed by your health care provider.  Do not become isolated. Spend time with other people and make plans for your days.  Keep careful watch on your blood sugar levels if you are diabetic. SEEK IMMEDIATE MEDICAL CARE IF:   You develop severe headaches, repeated vomiting, seizures, blackouts, or slurred speech.  There is increasing confusion, weakness, numbness, restlessness, or personality changes.  You develop a loss of balance, have marked dizziness, feel uncoordinated, or fall.  You have delusions, hallucinations, or develop severe anxiety.  Your family members think you need to be rechecked.   This information is not intended to replace advice given to you by your health care provider. Make sure you discuss any questions you have with your health care provider.   Document Released: 03/15/2004 Document Revised: 02/26/2014 Document Reviewed: 03/13/2013 Elsevier Interactive Patient Education [employer]

## 2015-02-16 NOTE — ED Notes (Signed)
Bed: [id number] Expected date:  Expected time:  Means of arrival:  Comments: EMS- 74yo M, AMS/without meds x 3 days

## 2015-02-16 NOTE — ED Notes (Signed)
Pt returned from CT °

## 2015-02-16 NOTE — Progress Notes (Signed)
Patient presents to Ed with increased confusion.  Patient with pmhx of cirrhosis and has not taken his medication in three days.  EDCM spoke to patient's daughter Kennith Centerracey at bedside.  Kennith Centerracey reports she  Lives with the patient.  [facility]  Patient is able to complete his ADL's without difficulty.  Patient does not need any devices to ambulate.  Kennith Centerracey reports the patient drives.  Kennith Centerracey is concerned about leaving the patient alone.  EDCM provided patient's daughter with list of private duty nursing agencies and explained it would be an out of pocket expense.  EDCM also provided patient's daughter with contact information to the [employer] [place].  Patient's [facility] also [facility]able [facility] for further resources and medication management.  Kennith Centerracey reports she assists patient with his medications at home. Kennith Centerracey reports the patient sometimes cooks for himself but was interested in pre packaged healthly meals.  EDCM informed patient's daughter that fit station may be an option for patient.  Patient does not qualify for meals on wheels as he is able to drive and his daughter lives with him.  [facility].  No furthe EDCM needs at this time.

## 2015-02-16 NOTE — ED Notes (Signed)
Pt is from home.  Hx of cirrhosis, DM and HTN.  Family went out of town for three days and left Pt with his meds laid out to take.  Pt did not take them.  Family got back last night so Pt has had PM and AM meds since then but they state Pt is still below baseline of mentation.  Pt is aware to self and situation.  CBG [id number]

## 2015-02-16 NOTE — ED Notes (Signed)
MD at bedside. 

## 2015-02-16 NOTE — Telephone Encounter (Signed)
Left message on machine to call back  

## 2015-02-16 NOTE — ED Provider Notes (Signed)
CSN: [id number]     Arrival date & time 02/16/15  1622 History   First MD Initiated Contact with Patient 02/16/15 1638     Chief Complaint  Patient presents with  . Altered Mental Status     (Consider location/radiation/quality/duration/timing/severity/associated sxs/prior Treatment) HPI  Keith [name] is a 74 y.o. male with PMH significant for HTN, GIB, cirrhosis, hepatic encephalopathy, anxiety, depression who presents with AMS.  Hx provided by patient's daughter.  She reports she went out of town and left medication out for her father to take accordingly; however, he did not take his medications as prescribed.  She reports when she came home he was acting confused, delusional, and not at his baseline.  She reports he missed 5 days of his routine medications which include lactulose and xifaxan.  He has received 2 doses since coming to the ED.  Daughter reports he has improved somewhat, but still not at baseline.  She reports this has happened in the past when he had hepatic encephalopathy.  Denies CP, SOB, abdominal pain, or urinary symptoms. Her symptoms are constant and moderate.   Past Medical History  Diagnosis Date  . HTN (hypertension)   . Hypercholesterolemia   . Alcohol abuse 2014    states last drink 07/03/14  . A-fib (HCC) 05/2012    in the setting of acute medical illness/ respiratory arrest. Not placed on anticoagulant.   . History of GI bleed ~ 1990    bleeding from ulcer  . History of cardiac arrest 05/2012    due to tracheostomy site bleeding and airway obstruction  . Angioedema 05/2012    with acute resp failure and hypoxia due to Lisinopril  . Respiratory arrest (HCC) 05/2012    caused by tracheostomy site bleeding and subsequent airway obstruciton.   . Anemia 05/2012, 07/2014.     normocytic 2014, macrocytic 2016  . Cirrhosis (HCC) 07/2014  . Depression   . Anxiety    Past Surgical History  Procedure Laterality Date  . Tracheostomy  05/22/12    emergent,  angioedema  . Tracheostomy tube placement N/A 05/31/2012    Procedure: TRACHEOSTOMY;  Surgeon: Flo ShanksKarol Wolicki, MD;  [facility];  Service: ENT;  Laterality: N/A;  . Laryngoscopy N/A 05/31/2012    Procedure: LARYNGOSCOPY;  Surgeon: Flo ShanksKarol Wolicki, MD;  [facility];  Service: ENT;  Laterality: N/A;  . Laryngoscopy N/A 06/19/2012    Procedure: DIRECT LARYNGOSCOPY AND BRONCHOSCOPY;  Surgeon: Melvenia BeamMitchell Gore, MD;  [facility];  Service: ENT;  Laterality: N/A;  . Tracheostomy revision N/A 06/19/2012    Procedure: TRACHEOSTOMY DECANNULATION;  Surgeon: Melvenia BeamMitchell Gore, MD;  [facility];  Service: ENT;  Laterality: N/A;  MICROSCOPE/TELESCOPE TRACHEOSTOMY REVISION  . Esophagogastroduodenoscopy N/A 08/07/2014    Procedure: ESOPHAGOGASTRODUODENOSCOPY (EGD);  Surgeon: Rachael Feeaniel P Jacobs, MD;  [facility];  Service: Endoscopy;  Laterality: N/A;  . Esophagogastroduodenoscopy N/A 08/10/2014    Procedure: ESOPHAGOGASTRODUODENOSCOPY (EGD);  Surgeon: Iva Booparl E Gessner, MD;  [facility];  Service: Endoscopy;  Laterality: N/A;  bedside   . Flexible sigmoidoscopy N/A 08/10/2014    Procedure: FLEXIBLE SIGMOIDOSCOPY;  Surgeon: Iva Booparl E Gessner, MD;  Location: [facility];  Service: Endoscopy;  Laterality: N/A;  bedside  . Esophagogastroduodenoscopy (egd) with propofol N/A 10/07/2014    Procedure: ESOPHAGOGASTRODUODENOSCOPY (EGD)  BANDING/WITH PROPOFOL;  Surgeon: Rachael Feeaniel P Jacobs, MD;  Location: [facility];  Service: Endoscopy;  Laterality: N/A;   Family History  Problem Relation Age of Onset  . Breast cancer Mother   .  Stroke Father    Social History  Substance Use Topics  . Smoking status: Never Smoker   . Smokeless tobacco: Never Used  . Alcohol Use: 0.0 oz/week    0 Standard drinks or equivalent per week     Comment: drinks daily. in 07/2012 drinking of 16-24 oz vodka daily reported. 09/22/14 none since 07/03/14    Review of Systems All other systems negative unless otherwise stated in  HPI    Allergies  Lisinopril and Amlodipine  Home Medications   Prior to Admission medications   Medication Sig Start Date End Date Taking? Authorizing Provider  albuterol (PROVENTIL HFA;VENTOLIN HFA) 108 (90 BASE) MCG/ACT inhaler Inhale 2 puffs into the lungs every 6 (six) hours as needed for wheezing. 06/20/12  Yes Russella Dar, NP  carvedilol (COREG) 25 MG tablet Take 25 mg by mouth 2 (two) times daily with a meal.   Yes Historical Provider, MD  disulfiram (ANTABUSE) 250 MG tablet Take 250 mg by mouth daily.   Yes Historical Provider, MD  EPINEPHrine (EPI-PEN) 0.3 mg/0.3 mL DEVI Inject 0.3 mg into the muscle as needed (for allergic reaction).  06/03/12  Yes Leslye Peer, MD  escitalopram (LEXAPRO) 5 MG tablet Take 5 mg by mouth daily. 01/26/15  Yes Historical Provider, MD  FERREX 150 150 MG capsule Take 150 mg by mouth daily. 09/02/14  Yes Historical Provider, MD  fluticasone (FLONASE) 50 MCG/ACT nasal spray Place 1 spray into both nostrils 2 (two) times daily as needed. For nasal congestion. 07/21/14  Yes Historical Provider, MD  folic acid (FOLVITE) 800 MCG tablet Take 800 mcg by mouth daily.   Yes Historical Provider, MD  furosemide (LASIX) 20 MG tablet Take 20 mg by mouth daily. 07/27/14  Yes Historical Provider, MD  ipratropium (ATROVENT) 0.06 % nasal spray INSTILL 2 SQUIRTS TWICE DAILY AS Needed for congestion 10/29/14  Yes Historical Provider, MD  lactulose (CHRONULAC) 10 GM/15ML solution Take 30 mLs (20 g total) by mouth 2 (two) times daily. 01/11/15  Yes Rachael Fee, MD  Multiple Vitamins-Minerals (MULTIVITAMIN WITH MINERALS) tablet Take 1 tablet by mouth daily.   Yes Historical Provider, MD  pantoprazole (PROTONIX) 40 MG tablet Take 1 tablet (40 mg total) by mouth daily. 08/16/14  Yes Kathlen Mody, MD  QUEtiapine (SEROQUEL) 25 MG tablet Take 1 tablet (25 mg total) by mouth 2 (two) times daily. Patient taking differently: Take 25 mg by mouth every other day.  08/16/14  Yes Kathlen Mody, MD  rifaximin (XIFAXAN) 550 MG TABS tablet Take 1 tablet (550 mg total) by mouth 2 (two) times daily. 08/16/14  Yes Kathlen Mody, MD  spironolactone (ALDACTONE) 50 MG tablet Take 1 tablet (50 mg total) by mouth daily. 10/07/14  Yes Rachael Fee, MD  thiamine 100 MG tablet Take 1 tablet (100 mg total) by mouth daily. 05/31/12  Yes Coralyn Helling, MD  traMADol (ULTRAM) 50 MG tablet Take 25-50 mg by mouth 2 (two) times daily as needed. pain 01/26/15  Yes Historical Provider, MD  carvedilol (COREG) 12.5 MG tablet Take 1 tablet (12.5 mg total) by mouth 2 (two) times daily. Patient not taking: Reported on 02/16/2015 12/21/14   Vesta Mixer, MD  folic acid (FOLVITE) 1 MG tablet Take 1 tablet (1 mg total) by mouth daily. Patient not taking: Reported on 02/16/2015 08/16/14   Kathlen Mody, MD   BP 142/81 mmHg  Pulse 62  Resp 11  SpO2 100% Physical Exam  Constitutional: He appears well-developed and  well-nourished.  HENT:  Head: Normocephalic and atraumatic.  Mouth/Throat: Oropharynx is clear and moist.  Eyes: Conjunctivae are normal. Pupils are equal, round, and reactive to light.  Neck: Normal range of motion. Neck supple.  Cardiovascular: Normal rate, regular rhythm and normal heart sounds.   No murmur heard. Pulmonary/Chest: Effort normal and breath sounds normal. No accessory muscle usage or stridor. No respiratory distress. He has no wheezes. He has no rhonchi. He has no rales.  Abdominal: Soft. Bowel sounds are normal. He exhibits no distension. There is no tenderness.  Musculoskeletal: Normal range of motion.  Lymphadenopathy:    He has no cervical adenopathy.  Neurological: He is alert.  Patient AOx2 to person and place.  Cranial nerves grossly intact.  Speech clear without dysarthria.  No pronator drift.  No asterixis.  Strength and sensation intact bilaterally throughout upper and lower extremities.   Skin: Skin is warm and dry.  Psychiatric: He has a normal mood and affect. His  behavior is normal.    ED Course  Procedures (including critical care time) Labs Review Labs Reviewed  COMPREHENSIVE METABOLIC PANEL - Abnormal; Notable for the following:    Glucose, Bld 326 (*)    Albumin 3.2 (*)    AST 46 (*)    Alkaline Phosphatase 134 (*)    All other components within normal limits  CBC WITH DIFFERENTIAL/PLATELET - Abnormal; Notable for the following:    RBC 3.97 (*)    Hemoglobin 10.3 (*)    HCT 32.6 (*)    MCH 25.9 (*)    RDW 18.9 (*)    Platelets 129 (*)    All other components within normal limits  URINALYSIS, ROUTINE W REFLEX MICROSCOPIC (NOT AT [facility]) - Abnormal; Notable for the following:    Specific Gravity, Urine 1.041 (*)    Glucose, UA >1000 (*)    All other components within normal limits  LIPASE, BLOOD - Abnormal; Notable for the following:    Lipase 57 (*)    All other components within normal limits  URINE MICROSCOPIC-ADD ON - Abnormal; Notable for the following:    Squamous Epithelial / LPF 0-5 (*)    Bacteria, UA RARE (*)    Casts HYALINE CASTS (*)    All other components within normal limits  CBG MONITORING, ED - Abnormal; Notable for the following:    Glucose-Capillary 297 (*)    All other components within normal limits  AMMONIA  I-STAT TROPOININ, ED    Imaging Review Ct Head Wo Contrast  02/16/2015  CLINICAL DATA:  Altered mental status. EXAM: CT HEAD WITHOUT CONTRAST TECHNIQUE: Contiguous axial images were obtained from the base of the skull through the vertex without intravenous contrast. COMPARISON:  08/03/2014 FINDINGS: There is age-appropriate atrophy. No acute intracranial abnormality. Specifically, no hemorrhage, hydrocephalus, mass lesion, acute infarction, or significant intracranial injury. No acute calvarial abnormality. Visualized paranasal sinuses and mastoids clear. Orbital soft tissues unremarkable. IMPRESSION: No acute intracranial abnormality. Electronically Signed   By: Charlett Nose M.D.   On: 02/16/2015 18:36    I have personally reviewed and evaluated these images and lab results as part of my medical decision-making.   EKG Interpretation   Date/Time:  Wednesday February 16 2015 16:40:09 EST Ventricular Rate:  66 PR Interval:  208 QRS Duration: 101 QT Interval:  406 QTC Calculation: 425 R Axis:   -37 Text Interpretation:  Sinus rhythm Low voltage, precordial leads Left  ventricular hypertrophy Anterior Q waves, possibly due to LVH Borderline T  abnormalities, inferior leads No significant change since last tracing  Confirmed by YAO  MD, DAVID ([id number]) on 02/16/2015 5:01:25 PM      MDM   Final diagnoses:  Altered mental status, unspecified altered mental status type    Patient presents with AMS after missing 5 days of home medications including xifaxan and lactulose.  VSS, NAD.  On exam, no focal neurological deficits.  Heart RRR, lungs CTAB, abdomen soft and benign.  No focal neurological deficits.  Patient AOx2 (person and place).   UA negative. EKG SR, significant changes from last tracing.  Troponin 0.00.   CMP, CBC, ammonia, lipase unremarkable. CT head shows no acute intracranial abnormality  Patient seen and assessed by Dr. Silverio Lay.  Patient mentating appropriately with nonfocal neurological exam.  Upon reassessment, non-focal neurological assessment, patient is stable.  Workup unremarkable.  Patient has support system at home.  Close PCP follow up.  Evaluation does not show pathology requring ongoing emergent intervention or admission. Pt is hemodynamically stable and mentating appropriately. Discussed findings/results and plan with patient/guardian, who agrees with plan. All questions answered. Return precautions discussed and outpatient follow up given.     Cheri Fowler, PA-C 02/16/15 2106  Richardean Canal, MD 02/16/15 [phone]

## 2015-02-16 NOTE — Telephone Encounter (Signed)
Pt daughter has been advised to take the pt to the ED for evaluation, he is disoriented and confused and has not had his lactulose in 5 days.

## 2015-02-17 ENCOUNTER — Other Ambulatory Visit: Payer: Self-pay

## 2015-02-17 ENCOUNTER — Other Ambulatory Visit: Payer: Self-pay | Admitting: Licensed Clinical Social Worker

## 2015-02-17 DIAGNOSIS — K7469 Other cirrhosis of liver: Secondary | ICD-10-CM

## 2015-02-17 NOTE — Patient Outreach (Signed)
Asssessment:  CSW Lorna FewScott Jaaron Oleson is providing CSW coverage for [employer] Land CSW.  Dannielle BurnScott Forrrest was asked to contact daughter of client to discuss client needs.  CSW Lorin PicketScott Kyrillos Adams called [employer] on 02/17/15.  CSW verified identity of Rozanna Boerracey Rucci. Rozanna Boerracey Norrod gave CSW Lorna FewScott Kyelle Urbas verbal permission to speak with her about client needs. She said client needed meal assistance when she traveled out of town for her job.  CSW informed Kennith Centerracey about Me[employer] on Wheels support. Kennith Centerracey said client is homebound and she thought he would qualify for Meals on Wheels. CSW encouraged Kennith Centerracey to call local [employer] to talk with agency representative about client and client needs. CSW encouraged Kennith Centerracey to talk with [employer] representative about possibility of client purchasing meals for client (lunch meals) when Kennith Centerracey is traveling out of town.  Kennith Centerracey said she would call local [employer] Whe[employer] agency today to discuss meal needs of client. CSW also informed Kennith Centerracey of local [employer] [facility] program and gave Kennith Centerracey address for [employer] program.  CSW gave Kennith Centerracey the names of the following in home care agencies: [facility], [facility], and [facility].  CSW spoke with Kennith Centerracey about in home care support for client and that she could call these agencies to discuss hourly care rates through those agencies. CSW Lorin PicketScott Mehreen Azizi informed Kennith Centerracey that CSW [employer] would be the regular, assigned CSW for client. Kennith Centerracey said she looked forward to speaking via phone with CSW Chrystal Land at a later time.  Rozanna Boerracey Thatch was very appreciative of information shared with her from CSW [place] Madelin Weseman on 02/17/15.  Plan:  Chrystal Land CSW to contact client/Tracey Folkes to speak further with client/Tracey about meal needs of client and in home care support needs of client.  Kelton PillarMichael S.Denvil Canning MSW, LCSW Licensed Clinical Social Worker [facility] Care [facility] [phone]

## 2015-02-17 NOTE — Patient Outreach (Signed)
Emergency department referral: Placed call to patient. Spoke with daughter who reports that she knew [facility]. Daughter reports that patient does well as long as he has his medications.  Daughter reports that she works from home but when she has to go out of town, patient struggles with taking his medications. Daughter denies any problems affording medications. Reports patient did not take medications for 5 days while she was out of town.   MEALS: Daughter reports that she was out of town for 5 days and when she return patient had drank 24 ensure, 2 gallons of egg nog, ice cream and milk,  Daughter reports no other local family members.  Daughter reports that patient needs meal assistance.  Reports "we can pay for prepared meals" but daughter reports that she does not Eriverto Byrnes and her dad needs better meals.   Needs:  Resources for prepared meals.  Caregiver assistance when daughter is out of town.  Patient does not qualify for meals on wheels because he is not home bound.  Plan:  No nursing needs at this time.  Will close to nursing. [facility]. Provided my contact information and informed daughter that she would get a call from [facility] worker.  Rowe PavyAmanda Minnetta Sandora, RN, BSN, CEN [facility] [employer] Coordinator [phone]

## 2015-02-22 ENCOUNTER — Telehealth: Payer: Self-pay | Admitting: Gastroenterology

## 2015-02-22 NOTE — Telephone Encounter (Signed)
Pt has been scheduled for 02/23/15 945 am with Dr Christella HartiganJacobs to discuss confusion and possible medication issue.  Pt's daughter is aware

## 2015-02-23 ENCOUNTER — Ambulatory Visit
Admission: RE | Admit: 2015-02-23 | Discharge: 2015-02-23 | Disposition: A | Discharge status: Home / Self Care | Payer: Medicare Other | Source: Ambulatory Visit | Attending: Physical Medicine and Rehabilitation | Admitting: Physical Medicine and Rehabilitation

## 2015-02-23 ENCOUNTER — Encounter: Payer: Self-pay | Admitting: Gastroenterology

## 2015-02-23 ENCOUNTER — Other Ambulatory Visit (INDEPENDENT_AMBULATORY_CARE_PROVIDER_SITE_OTHER): Payer: Medicare Other

## 2015-02-23 ENCOUNTER — Ambulatory Visit (INDEPENDENT_AMBULATORY_CARE_PROVIDER_SITE_OTHER): Payer: Medicare Other | Admitting: Gastroenterology

## 2015-02-23 ENCOUNTER — Ambulatory Visit (INDEPENDENT_AMBULATORY_CARE_PROVIDER_SITE_OTHER)
Admission: RE | Admit: 2015-02-23 | Discharge: 2015-02-23 | Disposition: A | Discharge status: Home / Self Care | Payer: Self-pay | Source: Ambulatory Visit | Attending: Gastroenterology | Admitting: Gastroenterology

## 2015-02-23 VITALS — BP 112/62 | HR 72 | Ht 67.0 in | Wt 208.0 lb

## 2015-02-23 DIAGNOSIS — G9340 Encephalopathy, unspecified: Secondary | ICD-10-CM | POA: Diagnosis not present

## 2015-02-23 DIAGNOSIS — M542 Cervicalgia: Secondary | ICD-10-CM

## 2015-02-23 DIAGNOSIS — K7460 Unspecified cirrhosis of liver: Secondary | ICD-10-CM

## 2015-02-23 LAB — URINALYSIS
Bilirubin Urine: NEGATIVE
Hgb urine dipstick: NEGATIVE
KETONES UR: NEGATIVE
Leukocytes, UA: NEGATIVE
Nitrite: NEGATIVE
PH: 6 (ref 5.0–8.0)
TOTAL PROTEIN, URINE-UPE24: NEGATIVE
UROBILINOGEN UA: 1 (ref 0.0–1.0)

## 2015-02-23 LAB — COMPREHENSIVE METABOLIC PANEL
ALK PHOS: 142 U/L — AB (ref 39–117)
ALT: 21 U/L (ref 0–53)
AST: 37 U/L (ref 0–37)
Albumin: 3.3 g/dL — ABNORMAL LOW (ref 3.5–5.2)
BUN: 12 mg/dL (ref 6–23)
CALCIUM: 9.4 mg/dL (ref 8.4–10.5)
CHLORIDE: 104 meq/L (ref 96–112)
CO2: 27 mEq/L (ref 19–32)
CREATININE: 0.71 mg/dL (ref 0.40–1.50)
GFR: 139.74 mL/min (ref >60.00)
Glucose, Bld: 115 mg/dL — ABNORMAL HIGH (ref 70–99)
Potassium: 4.8 mEq/L (ref 3.5–5.1)
SODIUM: 137 meq/L (ref 135–145)
TOTAL PROTEIN: 7.3 g/dL (ref 6.0–8.3)
Total Bilirubin: 1.2 mg/dL (ref 0.2–1.2)

## 2015-02-23 LAB — AMMONIA: Ammonia: 42 umol/L — ABNORMAL HIGH (ref 11–35)

## 2015-02-23 LAB — CBC WITH DIFFERENTIAL/PLATELET
BASOS PCT: 0.4 % (ref 0.0–3.0)
Basophils Absolute: 0 10*3/uL (ref 0.0–0.1)
EOS ABS: 0.2 10*3/uL (ref 0.0–0.7)
Eosinophils Relative: 3.4 % (ref 0.0–5.0)
HEMATOCRIT: 33.2 % — AB (ref 39.0–52.0)
Hemoglobin: 10.7 g/dL — ABNORMAL LOW (ref 13.0–17.0)
Lymphocytes Relative: 30.6 % (ref 12.0–46.0)
Lymphs Abs: 2.1 10*3/uL (ref 0.7–4.0)
MCHC: 32.1 g/dL (ref 30.0–36.0)
MCV: 82.6 fl (ref 78.0–100.0)
MONO ABS: 1.1 10*3/uL — AB (ref 0.1–1.0)
Monocytes Relative: 16 % — ABNORMAL HIGH (ref 3.0–12.0)
NEUTROS ABS: 3.4 10*3/uL (ref 1.4–7.7)
Neutrophils Relative %: 49.6 % (ref 43.0–77.0)
Platelets: 166 10*3/uL (ref 150.0–400.0)
RBC: 4.03 Mil/uL — ABNORMAL LOW (ref 4.22–5.81)
RDW: 21.5 % — AB (ref 11.5–15.5)
WBC: 6.9 10*3/uL (ref 4.0–10.5)

## 2015-02-23 LAB — PROTIME-INR
INR: 1.4 ratio — AB (ref 0.8–1.0)
PROTHROMBIN TIME: 14.5 s — AB (ref 9.6–13.1)

## 2015-02-23 NOTE — Patient Instructions (Signed)
You will have labs checked today in the basement lab.  Please head down after you check out with the front desk  (PA and lateral CXR, cbc, cmet, inr, ammonia, UA, urine culture). Please return to see Dr. Christella HartiganJacobs in 3-4 weeks, double book as needed.  Call sooner if needed.

## 2015-02-23 NOTE — Progress Notes (Signed)
Review of pertinent gastrointestinal problems: 1. Etoh cirrhosis  Bleeding esophageal varices: EGD 07/2014 Dr. Christella Hartigan during UGI bleeding; banded large varices; Repeat EGD 09/2014 Dr. Christella Hartigan banded small varices.   Recent liver imaging CT scan 07/2014 showed cirrhosis, portal hypertensive changes, [place] 01/2015 cirrhosis without liver masses  Small ascites on imaging 07/2014   HPI: This is a      Very pleasant 75 year old man who is here with his daughter today. I last saw him about a month ago.  Chief complaint is   cirrhosis  His  daughteris his primary care provider, she was out of town for about 5 days and when she returned he seemed disoriented. He had not been taking his medicines for 5 days. She brought him to the emergency room. CBC complete metabolic profile and urinalysis UA, CT scan of the head, ammonia levels were all fairly normal however his blood sugar level was quite elevated in the 300s.  He drank a case of ensure, a gallon of ice cream, two cartons of egg nog.  Went to ER.  Since then very sleepy (usually sleeps all day, up all night).  Can act a bit disoriented over night.  He is back on lactulose bid.    Daughter thinks hes 40% better  Since his ER visit and restarting of his usual medicines.   He's had no fevers or chills. He is back on lactulose twice daily, Xifaxan twice daily. He is having 2-3 loose bowel movements daily. No coughing, no fevers , no dysuria. No significant swelling of his abdomen or extremities .  Here in the office he is clearly more confused than he was when I last saw him a month ago. He does know his daughter her name. He thinks he is in a hospital. He does not know the year.   Past Medical History  Diagnosis Date  . HTN (hypertension)   . Hypercholesterolemia   . Alcohol abuse 2014    states last drink 07/03/14  . A-fib (HCC) 05/2012    in the setting of acute medical illness/ respiratory arrest. Not placed on anticoagulant.   . History of  GI bleed ~ 1990    bleeding from ulcer  . History of cardiac arrest 05/2012    due to tracheostomy site bleeding and airway obstruction  . Angioedema 05/2012    with acute resp failure and hypoxia due to Lisinopril  . Respiratory arrest (HCC) 05/2012    caused by tracheostomy site bleeding and subsequent airway obstruciton.   . Anemia 05/2012, 07/2014.     normocytic 2014, macrocytic 2016  . Cirrhosis (HCC) 07/2014  . Depression   . Anxiety     Past Surgical History  Procedure Laterality Date  . Tracheostomy  05/22/12    emergent, angioedema  . Tracheostomy tube placement N/A 05/31/2012    Procedure: TRACHEOSTOMY;  Surgeon: Flo Shanks, MD;  Location: [facility] OR;  Service: ENT;  Laterality: N/A;  . Laryngoscopy N/A 05/31/2012    Procedure: LARYNGOSCOPY;  Surgeon: Flo Shanks, MD;  [facility];  Service: ENT;  Laterality: N/A;  . Laryngoscopy N/A 06/19/2012    Procedure: DIRECT LARYNGOSCOPY AND BRONCHOSCOPY;  Surgeon: [name], MD;  Location: [facility] OR;  Service: ENT;  Laterality: N/A;  . Tracheostomy revision N/A 06/19/2012    Procedure: TRACHEOSTOMY DECANNULATION;  Surgeon: [name], MD;  Location: [facility] OR;  Service: ENT;  Laterality: N/A;  MICROSCOPE/TELESCOPE TRACHEOSTOMY REVISION  . Esophagogastroduodenoscopy N/A 08/07/2014    Procedure: ESOPHAGOGASTRODUODENOSCOPY (EGD);  Surgeon: Rachael Feeaniel P Ngai Parcell, MD;  [facility];  Service: Endoscopy;  Laterality: N/A;  . Esophagogastroduodenoscopy N/A 08/10/2014    Procedure: ESOPHAGOGASTRODUODENOSCOPY (EGD);  Surgeon: Iva Booparl E Gessner, MD;  [facility];  Service: Endoscopy;  Laterality: N/A;  bedside   . Flexible sigmoidoscopy N/A 08/10/2014    Procedure: FLEXIBLE SIGMOIDOSCOPY;  Surgeon: Iva Booparl E Gessner, MD;  Location: [facility]COPY;  Service: Endoscopy;  Laterality: N/A;  bedside  . Esophagogastroduodenoscopy (egd) with propofol N/A 10/07/2014    Procedure: ESOPHAGOGASTRODUODENOSCOPY (EGD)  BANDING/WITH PROPOFOL;  Surgeon: Rachael Feeaniel P  Francile Woolford, MD;  Location: [facility]COPY;  Service: Endoscopy;  Laterality: N/A;    Current Outpatient Prescriptions  Medication Sig Dispense Refill  . albuterol (PROVENTIL HFA;VENTOLIN HFA) 108 (90 BASE) MCG/ACT inhaler Inhale 2 puffs into the lungs every 6 (six) hours as needed for wheezing. 1 Inhaler 2  . carvedilol (COREG) 25 MG tablet Take 25 mg by mouth 2 (two) times daily with a meal.    . disulfiram (ANTABUSE) 250 MG tablet Take 250 mg by mouth daily.    Marland Kitchen. EPINEPHrine (EPI-PEN) 0.3 mg/0.3 mL DEVI Inject 0.3 mg into the muscle as needed (for allergic reaction).     Marland Kitchen. escitalopram (LEXAPRO) 5 MG tablet Take 5 mg by mouth daily.  3  . FERREX 150 150 MG capsule Take 150 mg by mouth daily.  5  . fluticasone (FLONASE) 50 MCG/ACT nasal spray Place 1 spray into both nostrils 2 (two) times daily as needed. For nasal congestion.    . folic acid (FOLVITE) 1 MG tablet Take 1 tablet (1 mg total) by mouth daily.    . folic acid (FOLVITE) 800 MCG tablet Take 800 mcg by mouth daily.    . furosemide (LASIX) 20 MG tablet Take 20 mg by mouth daily.  0  . ipratropium (ATROVENT) 0.06 % nasal spray INSTILL 2 SQUIRTS TWICE DAILY AS Needed for congestion  1  . lactulose (CHRONULAC) 10 GM/15ML solution Take 30 mLs (20 g total) by mouth 2 (two) times daily. 240 mL 0  . Multiple Vitamins-Minerals (MULTIVITAMIN WITH MINERALS) tablet Take 1 tablet by mouth daily.    . pantoprazole (PROTONIX) 40 MG tablet Take 1 tablet (40 mg total) by mouth daily. 30 tablet 0  . QUEtiapine (SEROQUEL) 25 MG tablet Take 1 tablet (25 mg total) by mouth 2 (two) times daily. (Patient taking differently: Take 25 mg by mouth every other day. ) 30 tablet 0  . rifaximin (XIFAXAN) 550 MG TABS tablet Take 1 tablet (550 mg total) by mouth 2 (two) times daily. 60 tablet 1  . spironolactone (ALDACTONE) 50 MG tablet Take 1 tablet (50 mg total) by mouth daily. 30 tablet 11  . thiamine 100 MG tablet Take 1 tablet (100 mg total) by mouth daily.    .  traMADol (ULTRAM) 50 MG tablet Take 25-50 mg by mouth 2 (two) times daily as needed. pain  1   No current facility-administered medications for this visit.    Allergies as of 02/23/2015 - Review Complete 02/23/2015  Allergen Reaction Noted  . Lisinopril Other (See Comments) 05/22/2012  . Amlodipine Swelling 07/24/2012    Family History  Problem Relation Age of Onset  . Breast cancer Mother   . Stroke Father     Social History   Social History  . Marital Status: Single    Spouse Name: N/A  . Number of Children: 2  . Years of Education: 12   Occupational History  . retired 2008  worked for [employer]   Social History Main Topics  . Smoking status: Never Smoker   . Smokeless tobacco: Never Used  . Alcohol Use: No     Comment: drinks daily. in 07/2012 drinking of 16-24 oz vodka daily reported. 09/22/14 none since 07/03/14  . Drug Use: No  . Sexual Activity: Not on file   Other Topics Concern  . Not on file   Social History Narrative   Single , lives with daughter in home   Caffeine use - coffee 1-2 Day     Physical Exam: BP 112/62 mmHg  Pulse 72  Ht 5\' 7"  (1.702 m)  Wt 208 lb (94.348 kg)  BMI 32.57 kg/m2 Constitutional:  Chronically ill-appearing, he sitting a wheelchair Psychiatric: alert and oriented x 2 Abdomen: soft, nontender, nondistended, no obvious ascites, no peritoneal signs, normal bowel sounds  trace lower extremity edema only  Assessment and plan: 75 y.o. male with  Decompensated cirrhosis   despite the fact that his ammonia level was normal in the emergency room last week I do think that he had hepatic encephalopathy due to noncompliance with his medicines , dietary indiscretions. His daughter feels he is recovered about 40% but is clearly not normal now yet. He is back on all his usual diabetic, liver medicines. On exam he does seem to still have some mild encephalopathy. No asterixis however. I'm going to repeat several lab tests that  he had done last week including CBC, complete metabolic profile, ammonia level, urinalysis and urine culture,. He did not have a chest x-ray do that for him today check for signs of mild pneumonia despite the fact that his cough or fever. He had no ascites on examination by ultrasound last month and he does not appear to have any now so I think it is unlikely he has SBP. I explained to his daughter that could just possibly take some time for this encephalopathy to clear. This also might be his new normal which I hope it is not. He'll return to see me in 3-4 weeks and sooner if needed.   Rob Bunting, MD [facility] 02/23/2015, 10:06 AM

## 2015-02-24 ENCOUNTER — Other Ambulatory Visit: Payer: Self-pay | Admitting: *Deleted

## 2015-02-24 LAB — URINE CULTURE
COLONY COUNT: NO GROWTH
ORGANISM ID, BACTERIA: NO GROWTH

## 2015-02-24 NOTE — Patient Outreach (Signed)
[facility] Care Management  02/24/2015  Keith MareClarence A [name] 06-12-1940 [id number]  Phone call to patient's daughter to assist with community resources for meals on wheels, or a program similar.  Patient's daughter could not talk at this time and stated that she would return the call on 02/25/15.  Plan: This social worker will follow up with patient's daughter on 02/25/15.   Adriana ReamsChrystal Land, LCSW [facility] Care Management [phone]

## 2015-03-01 ENCOUNTER — Other Ambulatory Visit: Payer: Self-pay | Admitting: *Deleted

## 2015-03-01 NOTE — Patient Outreach (Addendum)
[facility]) Care Management  03/01/2015  [name] [birth year] [id number]   Phone call to patient's daughter to discuss resources for meal delivery services. Per patient's daughter, patient is currently receiving personal care services through [facility], however would like a back up plan. Mobile meals, eligibility criteria and private pay options discussed.  Per patient's daughter, she would like to privately pay for Mobile Meals. $6.25 per meal.   Contact information [phone].    Patient's daughter also requested information regarding nutritional counseling for patient.  Contact information given for the Nutrition and Diabetes program, [phone] as well as [facility] [phone].  Patient does not have diabetes, it was discussed that the cost may not be covered by Medicare.  Out of pocket expense discussed.  $270.00 [facility], $70.00 [facility]  Patient's daughter appreciative of the referrals provided.  No further social work needs verbalized.  Patient's case to be closed to social work at this time.   RNCM to be notified of case closure.   Adriana ReamsChrystal Land, LCSW [facility] Care Management [phone]

## 2015-03-07 ENCOUNTER — Telehealth: Payer: Self-pay | Admitting: Gastroenterology

## 2015-03-08 NOTE — Telephone Encounter (Signed)
Dr Christella Hartigan I closed this before routing to you for review.

## 2015-03-08 NOTE — Telephone Encounter (Signed)
The pt's daughter states that her father did have a BM last night and is still confused some but about the same as his normal.  They will keep appt as scheduled and call back if symptoms worsen or change

## 2015-03-09 ENCOUNTER — Encounter: Payer: Self-pay | Admitting: Diagnostic Neuroimaging

## 2015-03-09 ENCOUNTER — Ambulatory Visit (INDEPENDENT_AMBULATORY_CARE_PROVIDER_SITE_OTHER): Payer: Medicare Other | Admitting: Diagnostic Neuroimaging

## 2015-03-09 VITALS — BP 135/71 | HR 58 | Ht 67.0 in | Wt 204.0 lb

## 2015-03-09 DIAGNOSIS — K7682 Hepatic encephalopathy: Secondary | ICD-10-CM

## 2015-03-09 DIAGNOSIS — R413 Other amnesia: Secondary | ICD-10-CM

## 2015-03-09 DIAGNOSIS — R4701 Aphasia: Secondary | ICD-10-CM

## 2015-03-09 DIAGNOSIS — K7290 Hepatic failure, unspecified without coma: Secondary | ICD-10-CM

## 2015-03-09 NOTE — Progress Notes (Signed)
[facility]  PATIENT: Keith [name] DOB: October 21, 1940  REFERRING CLINICIAN: Radford Pax HISTORY FROM: patient and daughter and aid REASON FOR VISIT: follow up    HISTORICAL  CHIEF COMPLAINT:  Chief Complaint  Patient presents with  . Asterixis    rm 7, dgtrFrench Ana, caregiver- Charity, FU req by DR Christella Hartigan- hepatic encephalopathy    HISTORY OF PRESENT ILLNESS:   UPDATE 03/09/15: Since last visit, has had significant decline in the last 1 month. Now with diabetes, worsening confusion, memory loss. Daughter left for 1 week vacation, and upon return, patient was very confused. Went to ER, had CT head, and then d/c home. Still with confusion, speech diff, poor energy. Ammonia level now 100.  UPDATE 11/22/14: Since last visit, doing better. Tremors have reduced. Still on lactulose and xifaxin. Still with issues of insomnia, depression, fatigue.  PRIOR HPI (09/22/14): 75 year old right-handed male here for evaluation of tremors. Patient has significant history of alcohol abuse, ultimately leading to GI bleeding, alcohol withdrawal coma, and critical illness. Patient was intensive care unit at that time in May 2016. Since that time he has been abstinent of alcohol. Patient did have some mild intermittent tremors, which have worsened since July 2016. Last week tremor significantly worsened. Patient was having increasing problems with balance. Patient has been under care of GI specialist, treated for hepatic encephalopathy, hyperammonemia, with Xifaxan and lactulose.    REVIEW OF SYSTEMS: Full 14 system review of systems performed and notable only for as per HPI.   ALLERGIES: Allergies  Allergen Reactions  . Lisinopril Other (See Comments)    Presented with laryngeal edema - likely caused is lisinopril   . Amlodipine Swelling    HOME MEDICATIONS: Outpatient Prescriptions Prior to Visit  Medication Sig Dispense Refill  . albuterol (PROVENTIL HFA;VENTOLIN HFA) 108 (90  BASE) MCG/ACT inhaler Inhale 2 puffs into the lungs every 6 (six) hours as needed for wheezing. 1 Inhaler 2  . carvedilol (COREG) 25 MG tablet Take 25 mg by mouth 2 (two) times daily with a meal.    . disulfiram (ANTABUSE) 250 MG tablet Take 250 mg by mouth daily.    Marland Kitchen EPINEPHrine (EPI-PEN) 0.3 mg/0.3 mL DEVI Inject 0.3 mg into the muscle as needed (for allergic reaction).     Marland Kitchen escitalopram (LEXAPRO) 5 MG tablet Take 5 mg by mouth daily.  3  . FERREX 150 150 MG capsule Take 150 mg by mouth daily.  5  . fluticasone (FLONASE) 50 MCG/ACT nasal spray Place 1 spray into both nostrils 2 (two) times daily as needed. For nasal congestion.    . folic acid (FOLVITE) 1 MG tablet Take 1 tablet (1 mg total) by mouth daily.    . folic acid (FOLVITE) 800 MCG tablet Take 800 mcg by mouth daily.    . furosemide (LASIX) 20 MG tablet Take 20 mg by mouth daily.  0  . ipratropium (ATROVENT) 0.06 % nasal spray INSTILL 2 SQUIRTS TWICE DAILY AS Needed for congestion  1  . lactulose (CHRONULAC) 10 GM/15ML solution Take 30 mLs (20 g total) by mouth 2 (two) times daily. 240 mL 0  . Multiple Vitamins-Minerals (MULTIVITAMIN WITH MINERALS) tablet Take 1 tablet by mouth daily.    . pantoprazole (PROTONIX) 40 MG tablet Take 1 tablet (40 mg total) by mouth daily. 30 tablet 0  . QUEtiapine (SEROQUEL) 25 MG tablet Take 1 tablet (25 mg total) by mouth 2 (two) times daily. (Patient taking differently: Take 25 mg by mouth every  other day. ) 30 tablet 0  . rifaximin (XIFAXAN) 550 MG TABS tablet Take 1 tablet (550 mg total) by mouth 2 (two) times daily. 60 tablet 1  . spironolactone (ALDACTONE) 50 MG tablet Take 1 tablet (50 mg total) by mouth daily. 30 tablet 11  . thiamine 100 MG tablet Take 1 tablet (100 mg total) by mouth daily.    . traMADol (ULTRAM) 50 MG tablet Take 25-50 mg by mouth 2 (two) times daily as needed. pain  1   No facility-administered medications prior to visit.    PAST MEDICAL HISTORY: Past Medical History    Diagnosis Date  . HTN (hypertension)   . Hypercholesterolemia   . Alcohol abuse 2014    states last drink 07/03/14  . A-fib (HCC) 05/2012    in the setting of acute medical illness/ respiratory arrest. Not placed on anticoagulant.   . History of GI bleed ~ 1990    bleeding from ulcer  . History of cardiac arrest 05/2012    due to tracheostomy site bleeding and airway obstruction  . Angioedema 05/2012    with acute resp failure and hypoxia due to Lisinopril  . Respiratory arrest (HCC) 05/2012    caused by tracheostomy site bleeding and subsequent airway obstruciton.   . Anemia 05/2012, 07/2014.     normocytic 2014, macrocytic 2016  . Cirrhosis (HCC) 07/2014  . Depression   . Anxiety     PAST SURGICAL HISTORY: Past Surgical History  Procedure Laterality Date  . Tracheostomy  05/22/12    emergent, angioedema  . Tracheostomy tube placement N/A 05/31/2012    Procedure: TRACHEOSTOMY;  Surgeon: Flo Shanks, MD;  Location: [facility] OR;  Service: ENT;  Laterality: N/A;  . Laryngoscopy N/A 05/31/2012    Procedure: LARYNGOSCOPY;  Surgeon: Flo Shanks, MD;  Location: [facility] OR;  Service: ENT;  Laterality: N/A;  . Laryngoscopy N/A 06/19/2012    Procedure: DIRECT LARYNGOSCOPY AND BRONCHOSCOPY;  Surgeon: [name], MD;  Location: [facility] OR;  Service: ENT;  Laterality: N/A;  . Tracheostomy revision N/A 06/19/2012    Procedure: TRACHEOSTOMY DECANNULATION;  Surgeon: [name], MD;  Location: [facility] OR;  Service: ENT;  Laterality: N/A;  MICROSCOPE/TELESCOPE TRACHEOSTOMY REVISION  . Esophagogastroduodenoscopy N/A 08/07/2014    Procedure: ESOPHAGOGASTRODUODENOSCOPY (EGD);  Surgeon: Rachael Fee, MD;  Location[facility];  Service: Endoscopy;  Laterality: N/A;  . Esophagogastroduodenoscopy N/A 08/10/2014    Procedure: ESOPHAGOGASTRODUODENOSCOPY (EGD);  Surgeon: Iva Boop, MD;  Location: [facility];  Service: Endoscopy;  Laterality: N/A;  bedside   . Flexible sigmoidoscopy N/A 08/10/2014    Procedure:  FLEXIBLE SIGMOIDOSCOPY;  Surgeon: Iva Boop, MD;  Location: [facility];  Service: Endoscopy;  Laterality: N/A;  bedside  . Esophagogastroduodenoscopy (egd) with propofol N/A 10/07/2014    Procedure: ESOPHAGOGASTRODUODENOSCOPY (EGD)  BANDING/WITH PROPOFOL;  Surgeon: Rachael Fee, MD;  Location: [facility];  Service: Endoscopy;  Laterality: N/A;    FAMILY HISTORY: Family History  Problem Relation Age of Onset  . Breast cancer Mother   . Stroke Father     SOCIAL HISTORY:  Social History   Social History  . Marital Status: Single    Spouse Name: N/A  . Number of Children: 2  . Years of Education: 12   Occupational History  . retired 2008     worked for [employer]   Social History Main Topics  . Smoking status: Never Smoker   . Smokeless tobacco: Never Used  . Alcohol Use: No  Comment: drinks daily. in 07/2012 drinking of 16-24 oz vodka daily reported. 09/22/14 none since 07/03/14  . Drug Use: No  . Sexual Activity: Not on file   Other Topics Concern  . Not on file   Social History Narrative   Single , lives with daughter in home   Caffeine use - coffee 1-2 Day     PHYSICAL EXAM  GENERAL EXAM/CONSTITUTIONAL: Vitals:  Filed Vitals:   03/09/15 1448  BP: 135/71  Pulse: 58  Height:  (1.702 m)  Weight: 204 lb (92.534 kg)   Body mass index is 31.94 kg/(m^2).  No exam data presentPatient is in no distress; well developed, nourished and groomed; neck is supple  CARDIOVASCULAR:  Examination of carotid arteries is normal; no carotid bruits  Regular rate and rhythm, no murmurs  Examination of peripheral vascular system by observation and palpation is normal  EYES:  Ophthalmoscopic exam of optic discs and posterior segments is normal; no papilledema or hemorrhages  MUSCULOSKELETAL:  Gait, strength, tone, movements noted in Neurologic exam below  NEUROLOGIC: MENTAL STATUS:  No flowsheet data found.  awake, alert, oriented to person;  NOT PLACE OR TIME  DECR memory  DECR attention and concentration  EXPRESSIVE APHASIA; CIRCUMLOCUTION; DECR COMPREHENSION; NAMES INK PEN; CANNOT NAME COMPUTER, KEYBOARD OR PHONE; SOME PERSEVERATION NOTED  fund of knowledge appropriate  CRANIAL NERVE:   2nd - no papilledema on fundoscopic exam  2nd, 3rd, 4th, 6th - pupils equal and reactive to light, visual fields full to confrontation, extraocular muscles intact, no nystagmus  5th - facial sensation symmetric  7th - facial strength symmetric  8th - DECR HEARING  9th - palate elevates symmetrically, uvula midline  11th - shoulder shrug symmetric  12th - tongue protrusion midline  MOTOR:   normal bulk; MILD ASTERIXIS IN HANDS; DIFFUSE 4+ strength in the BUE, BLE  SENSORY:   normal and symmetric to light touch, temperature, vibration  COORDINATION:   finger-nose-finger, fine finger movements normal  REFLEXES:   deep tendon reflexes TRACE and symmetric; ABSENT AT ANKLES  GAIT/STATION:   IN WHEEL CHAIR    DIAGNOSTIC DATA (LABS, IMAGING, TESTING) - I reviewed patient records, labs, notes, testing and imaging myself where available.  Lab Results  Component Value Date   WBC 6.9 02/23/2015   HGB 10.7* 02/23/2015   HCT 33.2* 02/23/2015   MCV 82.6 02/23/2015   PLT 166.0 02/23/2015      Component Value Date/Time   NA 137 02/23/2015 1037   K 4.8 02/23/2015 1037   CL 104 02/23/2015 1037   CO2 27 02/23/2015 1037   GLUCOSE 115* 02/23/2015 1037   BUN 12 02/23/2015 1037   CREATININE 0.71 02/23/2015 1037   CALCIUM 9.4 02/23/2015 1037   PROT 7.3 02/23/2015 1037   ALBUMIN 3.3* 02/23/2015 1037   AST 37 02/23/2015 1037   ALT 21 02/23/2015 1037   ALKPHOS 142* 02/23/2015 1037   BILITOT 1.2 02/23/2015 1037   GFRNONAA >60 02/16/2015 1701   GFRAA >60 02/16/2015 1701   No results found for: CHOL, HDL, LDLCALC, LDLDIRECT, TRIG, CHOLHDL No results found for: ZOXW9U No results found for: VITAMINB12 Lab Results   Component Value Date   TSH 1.678 06/02/2012   AMMONIA  Date Value Ref Range Status  02/23/2015 42* 11 - 35 umol/L Final  02/16/2015 35 9 - 35 umol/L Final  09/22/2014 248* 27 - 102 ug/dL Final    0/45/40 CT HEAD [I reviewed images myself and agree with interpretation. -VRP]  -  No acute intracranial abnormality. Age-appropriate atrophy.  02/16/15 CT head [I reviewed images myself and agree with interpretation. -VRP]  - No acute intracranial abnormality.     ASSESSMENT AND PLAN  75 y.o. year old male here with significant history of chronic alcohol abuse, liver cirrhosis, hepatic encephalopathy, now with progressive tremors, balance difficulties, asterixis. Most likely represents toxic metabolic etiology related to chronic alcohol abuse and hyperammonemia.   Now with sudden onset confusion, memory loss, expressive aphasia since end of Dec 2016. Likely represents new onset stroke. Also with significant decline in ADLs.  Dx:  Encephalopathy, hepatic (HCC) - Plan: MR Brain Wo Contrast  Expressive aphasia - Plan: MR Brain Wo Contrast  Memory loss - Plan: MR Brain Wo Contrast   PLAN: I spent 40 minutes of face to face time with patient. Greater than 50% of time was spent in counseling and coordination of care with patient. In summary we discussed:  - MRI brain - advanced care planning reviewed; will setup home palliative care consult; daughter needs more support  Orders Placed This Encounter  Procedures  . MR Brain Wo Contrast  . Amb Referral to Palliative Care   Return in about 6 weeks (around 04/20/2015).    Suanne Marker, MD 03/09/2015, 3:07 PM Certified in Neurology, Neurophysiology and Neuroimaging  [facility] [address], [address] [place], [place] [address] [phone]

## 2015-03-15 ENCOUNTER — Inpatient Hospital Stay: Admission: RE | Admit: 2015-03-15 | Payer: Self-pay | Source: Ambulatory Visit

## 2015-03-18 ENCOUNTER — Telehealth: Payer: Self-pay | Admitting: Gastroenterology

## 2015-03-21 ENCOUNTER — Emergency Department (HOSPITAL_COMMUNITY): Payer: Medicare Other

## 2015-03-21 ENCOUNTER — Emergency Department (HOSPITAL_COMMUNITY)
Admission: EM | Admit: 2015-03-21 | Discharge: 2015-03-21 | Disposition: A | Discharge status: Home / Self Care | Payer: Medicare Other | Attending: Emergency Medicine | Admitting: Emergency Medicine

## 2015-03-21 ENCOUNTER — Encounter (HOSPITAL_COMMUNITY): Payer: Self-pay | Admitting: Emergency Medicine

## 2015-03-21 DIAGNOSIS — Y9389 Activity, other specified: Secondary | ICD-10-CM | POA: Diagnosis not present

## 2015-03-21 DIAGNOSIS — Z8674 Personal history of sudden cardiac arrest: Secondary | ICD-10-CM | POA: Insufficient documentation

## 2015-03-21 DIAGNOSIS — Z79899 Other long term (current) drug therapy: Secondary | ICD-10-CM | POA: Diagnosis not present

## 2015-03-21 DIAGNOSIS — Z8719 Personal history of other diseases of the digestive system: Secondary | ICD-10-CM | POA: Insufficient documentation

## 2015-03-21 DIAGNOSIS — Z7984 Long term (current) use of oral hypoglycemic drugs: Secondary | ICD-10-CM | POA: Diagnosis not present

## 2015-03-21 DIAGNOSIS — R251 Tremor, unspecified: Secondary | ICD-10-CM | POA: Insufficient documentation

## 2015-03-21 DIAGNOSIS — D649 Anemia, unspecified: Secondary | ICD-10-CM | POA: Insufficient documentation

## 2015-03-21 DIAGNOSIS — E7220 Disorder of urea cycle metabolism, unspecified: Secondary | ICD-10-CM | POA: Insufficient documentation

## 2015-03-21 DIAGNOSIS — Z043 Encounter for examination and observation following other accident: Secondary | ICD-10-CM | POA: Insufficient documentation

## 2015-03-21 DIAGNOSIS — R531 Weakness: Secondary | ICD-10-CM | POA: Diagnosis present

## 2015-03-21 DIAGNOSIS — Z7952 Long term (current) use of systemic steroids: Secondary | ICD-10-CM | POA: Insufficient documentation

## 2015-03-21 DIAGNOSIS — R410 Disorientation, unspecified: Secondary | ICD-10-CM | POA: Insufficient documentation

## 2015-03-21 DIAGNOSIS — F329 Major depressive disorder, single episode, unspecified: Secondary | ICD-10-CM | POA: Insufficient documentation

## 2015-03-21 DIAGNOSIS — Y998 Other external cause status: Secondary | ICD-10-CM | POA: Diagnosis not present

## 2015-03-21 DIAGNOSIS — Z792 Long term (current) use of antibiotics: Secondary | ICD-10-CM | POA: Diagnosis not present

## 2015-03-21 DIAGNOSIS — F419 Anxiety disorder, unspecified: Secondary | ICD-10-CM | POA: Diagnosis not present

## 2015-03-21 DIAGNOSIS — Y92009 Unspecified place in unspecified non-institutional (private) residence as the place of occurrence of the external cause: Secondary | ICD-10-CM | POA: Insufficient documentation

## 2015-03-21 DIAGNOSIS — I10 Essential (primary) hypertension: Secondary | ICD-10-CM | POA: Insufficient documentation

## 2015-03-21 DIAGNOSIS — R7989 Other specified abnormal findings of blood chemistry: Secondary | ICD-10-CM

## 2015-03-21 LAB — URINALYSIS, ROUTINE W REFLEX MICROSCOPIC
Bilirubin Urine: NEGATIVE
Glucose, UA: NEGATIVE mg/dL
Hgb urine dipstick: NEGATIVE
Ketones, ur: NEGATIVE mg/dL
LEUKOCYTES UA: NEGATIVE
Nitrite: NEGATIVE
PROTEIN: NEGATIVE mg/dL
Specific Gravity, Urine: 1.017 (ref 1.005–1.030)
pH: 5 (ref 5.0–8.0)

## 2015-03-21 LAB — PROTIME-INR
INR: 1.27 (ref 0.00–1.49)
PROTHROMBIN TIME: 16 s — AB (ref 11.6–15.2)

## 2015-03-21 LAB — COMPREHENSIVE METABOLIC PANEL
ALT: 36 U/L (ref 17–63)
AST: 62 U/L — AB (ref 15–41)
Albumin: 2.8 g/dL — ABNORMAL LOW (ref 3.5–5.0)
Alkaline Phosphatase: 110 U/L (ref 38–126)
Anion gap: 10 (ref 5–15)
BUN: 8 mg/dL (ref 6–20)
CHLORIDE: 101 mmol/L (ref 101–111)
CO2: 22 mmol/L (ref 22–32)
CREATININE: 0.8 mg/dL (ref 0.61–1.24)
Calcium: 9 mg/dL (ref 8.9–10.3)
GFR calc Af Amer: >60 mL/min (ref >60)
Glucose, Bld: 114 mg/dL — ABNORMAL HIGH (ref 65–99)
POTASSIUM: 4.5 mmol/L (ref 3.5–5.1)
SODIUM: 133 mmol/L — AB (ref 135–145)
TOTAL PROTEIN: 7 g/dL (ref 6.5–8.1)
Total Bilirubin: 0.9 mg/dL (ref 0.3–1.2)

## 2015-03-21 LAB — CBC
HCT: 31.6 % — ABNORMAL LOW (ref 39.0–52.0)
Hemoglobin: 10.4 g/dL — ABNORMAL LOW (ref 13.0–17.0)
MCH: 26.2 pg (ref 26.0–34.0)
MCHC: 32.9 g/dL (ref 30.0–36.0)
MCV: 79.6 fL (ref 78.0–100.0)
PLATELETS: 138 10*3/uL — AB (ref 150–400)
RBC: 3.97 MIL/uL — ABNORMAL LOW (ref 4.22–5.81)
RDW: 18.3 % — AB (ref 11.5–15.5)
WBC: 8.3 10*3/uL (ref 4.0–10.5)

## 2015-03-21 LAB — DIFFERENTIAL
BASOS ABS: 0 10*3/uL (ref 0.0–0.1)
BASOS PCT: 0 %
EOS ABS: 0.1 10*3/uL (ref 0.0–0.7)
Eosinophils Relative: 2 %
Lymphocytes Relative: 21 %
Lymphs Abs: 1.8 10*3/uL (ref 0.7–4.0)
MONOS PCT: 16 %
Monocytes Absolute: 1.3 10*3/uL — ABNORMAL HIGH (ref 0.1–1.0)
NEUTROS ABS: 5.1 10*3/uL (ref 1.7–7.7)
NEUTROS PCT: 61 %

## 2015-03-21 LAB — RAPID URINE DRUG SCREEN, HOSP PERFORMED
Amphetamines: NOT DETECTED
BARBITURATES: NOT DETECTED
Benzodiazepines: NOT DETECTED
COCAINE: NOT DETECTED
Opiates: NOT DETECTED
Tetrahydrocannabinol: NOT DETECTED

## 2015-03-21 LAB — I-STAT TROPONIN, ED: TROPONIN I, POC: 0.01 ng/mL (ref 0.00–0.08)

## 2015-03-21 LAB — I-STAT CHEM 8, ED
BUN: 9 mg/dL (ref 6–20)
CHLORIDE: 100 mmol/L — AB (ref 101–111)
Calcium, Ion: 1.15 mmol/L (ref 1.13–1.30)
Creatinine, Ser: 0.7 mg/dL (ref 0.61–1.24)
GLUCOSE: 110 mg/dL — AB (ref 65–99)
HEMATOCRIT: 35 % — AB (ref 39.0–52.0)
HEMOGLOBIN: 11.9 g/dL — AB (ref 13.0–17.0)
POTASSIUM: 4.2 mmol/L (ref 3.5–5.1)
SODIUM: 135 mmol/L (ref 135–145)
TCO2: 22 mmol/L (ref 0–100)

## 2015-03-21 LAB — APTT: APTT: 34 s (ref 24–37)

## 2015-03-21 LAB — ETHANOL

## 2015-03-21 LAB — AMMONIA: AMMONIA: 66 umol/L — AB (ref 9–35)

## 2015-03-21 MED ORDER — LACTULOSE 10 GM/15ML PO SOLN
20.0000 g | Freq: Once | ORAL | Status: AC
  Administered 2015-03-21: 20 g via ORAL
  Filled 2015-03-21: qty 30

## 2015-03-21 MED ORDER — SODIUM CHLORIDE 0.9 % IV BOLUS (SEPSIS)
1000.0000 mL | Freq: Once | INTRAVENOUS | Status: AC
  Administered 2015-03-21: 1000 mL via INTRAVENOUS

## 2015-03-21 NOTE — ED Notes (Signed)
MD at bedside. 

## 2015-03-21 NOTE — Telephone Encounter (Signed)
Pt is constipated and last bowel movement was last Saturday, he is on tramadol 3 times daily due to pinched nerve in his back.  Pt's daughter wants to know what Dr Christella Hartigan suggest, increase lactulose, add stool softener, etc?  Pt is confused but better than he has been.  Please advise

## 2015-03-21 NOTE — ED Notes (Signed)
Pt stated he had to have a BM, placed pt on bedpan and pt did not have a BM. Also tried to get pt to void but pt could not void at this time. Nurse was notified.

## 2015-03-21 NOTE — ED Notes (Signed)
Ambulated pt in hallway with assistance. Tolerated very well.

## 2015-03-21 NOTE — ED Provider Notes (Signed)
CSN: [id number]     Arrival date & time 03/21/15  1341 History   First MD Initiated Contact with Patient 03/21/15 1353     Chief Complaint  Patient presents with  . Weakness     Patient is a 75 y.o. male presenting with weakness. The history is provided by the patient. No language interpreter was used.  Weakness   Keith [name] is a 75 y.o. male who presents to the Emergency Department complaining of weakness. History is provided by EMS, the patient, family. He was at home with his family and home health aide. He was getting up to use the bathroom and became very shaky and had difficulty walking and was lowered to the floor. He had some pulling of his right lip and mouth to the side. It is unclear if he was drooping or grimacing. He reports feeling weak, recent weight loss. No fevers, chest pain, shortness of breath, abdominal pain. Symptoms are moderate and constant nature.  Past Medical History  Diagnosis Date  . HTN (hypertension)   . Hypercholesterolemia   . Alcohol abuse 2014    states last drink 07/03/14  . A-fib (HCC) 05/2012    in the setting of acute medical illness/ respiratory arrest. Not placed on anticoagulant.   . History of GI bleed ~ 1990    bleeding from ulcer  . History of cardiac arrest 05/2012    due to tracheostomy site bleeding and airway obstruction  . Angioedema 05/2012    with acute resp failure and hypoxia due to Lisinopril  . Respiratory arrest (HCC) 05/2012    caused by tracheostomy site bleeding and subsequent airway obstruciton.   . Anemia 05/2012, 07/2014.     normocytic 2014, macrocytic 2016  . Cirrhosis (HCC) 07/2014  . Depression   . Anxiety    Past Surgical History  Procedure Laterality Date  . Tracheostomy  05/22/12    emergent, angioedema  . Tracheostomy tube placement N/A 05/31/2012    Procedure: TRACHEOSTOMY;  Surgeon: Flo Shanks, MD;  Location: [facility] OR;  Service: ENT;  Laterality: N/A;  . Laryngoscopy N/A 05/31/2012    Procedure:  LARYNGOSCOPY;  Surgeon: Flo Shanks, MD;  Location: [facility] OR;  Service: ENT;  Laterality: N/A;  . Laryngoscopy N/A 06/19/2012    Procedure: DIRECT LARYNGOSCOPY AND BRONCHOSCOPY;  Surgeon: [name], MD;  Location: [facility] OR;  Service: ENT;  Laterality: N/A;  . Tracheostomy revision N/A 06/19/2012    Procedure: TRACHEOSTOMY DECANNULATION;  Surgeon: [name], MD;  Location: [facility] OR;  Service: ENT;  Laterality: N/A;  MICROSCOPE/TELESCOPE TRACHEOSTOMY REVISION  . Esophagogastroduodenoscopy N/A 08/07/2014    Procedure: ESOPHAGOGASTRODUODENOSCOPY (EGD);  Surgeon: Rachael Fee, MD;  Location: [facility]COPY;  Service: Endoscopy;  Laterality: N/A;  . Esophagogastroduodenoscopy N/A 08/10/2014    Procedure: ESOPHAGOGASTRODUODENOSCOPY (EGD);  Surgeon: Iva Boop, MD;  Location: [facility]OSCOPY;  Service: Endoscopy;  Laterality: N/A;  bedside   . Flexible sigmoidoscopy N/A 08/10/2014    Procedure: FLEXIBLE SIGMOIDOSCOPY;  Surgeon: Iva Boop, MD;  Location: [facility] ENDOSCOPY;  Service: Endoscopy;  Laterality: N/A;  bedside  . Esophagogastroduodenoscopy (egd) with propofol N/A 10/07/2014    Procedure: ESOPHAGOGASTRODUODENOSCOPY (EGD)  BANDING/WITH PROPOFOL;  Surgeon: Rachael Fee, MD;  Location: [facility] ENDOSCOPY;  Service: Endoscopy;  Laterality: N/A;   Family History  Problem Relation Age of Onset  . Breast cancer Mother   . Stroke Father    Social History  Substance Use Topics  . Smoking status: Never Smoker   .  Smokeless tobacco: Never Used  . Alcohol Use: No     Comment: drinks daily. in 07/2012 drinking of 16-24 oz vodka daily reported. 09/22/14 none since 07/03/14    Review of Systems  Neurological: Positive for weakness.  All other systems reviewed and are negative.     Allergies  Lisinopril and Amlodipine  Home Medications   Prior to Admission medications   Medication Sig Start Date End Date Taking? Authorizing Provider  albuterol (PROVENTIL HFA;VENTOLIN HFA) 108 (90 BASE) MCG/ACT inhaler  Inhale 2 puffs into the lungs every 6 (six) hours as needed for wheezing. 06/20/12   Russella Dar, NP  carvedilol (COREG) 25 MG tablet Take 25 mg by mouth 2 (two) times daily with a meal.    Historical Provider, MD  disulfiram (ANTABUSE) 250 MG tablet Take 250 mg by mouth daily.    Historical Provider, MD  EPINEPHrine (EPI-PEN) 0.3 mg/0.3 mL DEVI Inject 0.3 mg into the muscle as needed (for allergic reaction).  06/03/12   Leslye Peer, MD  escitalopram (LEXAPRO) 5 MG tablet Take 5 mg by mouth daily. 01/26/15   Historical Provider, MD  FERREX 150 150 MG capsule Take 150 mg by mouth daily. 09/02/14   Historical Provider, MD  fluticasone (FLONASE) 50 MCG/ACT nasal spray Place 1 spray into both nostrils 2 (two) times daily as needed. For nasal congestion. 07/21/14   Historical Provider, MD  folic acid (FOLVITE) 1 MG tablet Take 1 tablet (1 mg total) by mouth daily. 08/16/14   Kathlen Mody, MD  folic acid (FOLVITE) 800 MCG tablet Take 800 mcg by mouth daily.    Historical Provider, MD  furosemide (LASIX) 20 MG tablet Take 20 mg by mouth daily. 07/27/14   Historical Provider, MD  ipratropium (ATROVENT) 0.06 % nasal spray INSTILL 2 SQUIRTS TWICE DAILY AS Needed for congestion 10/29/14   Historical Provider, MD  lactulose (CHRONULAC) 10 GM/15ML solution Take 30 mLs (20 g total) by mouth 2 (two) times daily. 01/11/15   Rachael Fee, MD  Multiple Vitamins-Minerals (MULTIVITAMIN WITH MINERALS) tablet Take 1 tablet by mouth daily.    Historical Provider, MD  pantoprazole (PROTONIX) 40 MG tablet Take 1 tablet (40 mg total) by mouth daily. 08/16/14   Kathlen Mody, MD  predniSONE (STERAPRED UNI-PAK 21 TAB) 10 MG (21) TBPK tablet Dose pack 03/07/15   Historical Provider, MD  QUEtiapine (SEROQUEL) 25 MG tablet Take 1 tablet (25 mg total) by mouth 2 (two) times daily. Patient taking differently: Take 25 mg by mouth every other day.  08/16/14   Kathlen Mody, MD  rifaximin (XIFAXAN) 550 MG TABS tablet Take 1 tablet (550 mg  total) by mouth 2 (two) times daily. 08/16/14   Kathlen Mody, MD  sitaGLIPtin (JANUVIA) 100 MG tablet Take 100 mg by mouth. 03/09/15 04/08/15  Historical Provider, MD  spironolactone (ALDACTONE) 50 MG tablet Take 1 tablet (50 mg total) by mouth daily. 10/07/14   Rachael Fee, MD  thiamine 100 MG tablet Take 1 tablet (100 mg total) by mouth daily. 05/31/12   Coralyn Helling, MD  traMADol (ULTRAM) 50 MG tablet Take 25-50 mg by mouth 2 (two) times daily as needed. pain 01/26/15   Historical Provider, MD   BP 123/57 mmHg  Pulse 63  Temp(Src) 98.5 F (36.9 C) (Oral)  Resp 19  Ht  (1.702 m)  Wt 184 lb (83.462 kg)  BMI 28.81 kg/m2  SpO2 97% Physical Exam  Constitutional: He is oriented to person, place, and time.  He appears well-developed and well-nourished.  Chronically ill-appearing  HENT:  Head: Normocephalic and atraumatic.  Cardiovascular: Normal rate and regular rhythm.   No murmur heard. Pulmonary/Chest: Effort normal and breath sounds normal. No respiratory distress.  Abdominal: Soft. There is no tenderness. There is no rebound and no guarding.  Musculoskeletal: He exhibits no edema or tenderness.  Neurological: He is alert and oriented to person, place, and time.  Mildly confused. Slightly tremulous. Sensation to light touch is intact in all 4 extremities. There is 5 out of 5 strength in all 4 extremities. No asymmetry of facial expression.  Skin: Skin is warm and dry.  Psychiatric: He has a normal mood and affect. His behavior is normal.  Nursing note and vitals reviewed.   ED Course  Procedures (including critical care time) Labs Review Labs Reviewed  PROTIME-INR - Abnormal; Notable for the following:    Prothrombin Time 16.0 (*)    All other components within normal limits  CBC - Abnormal; Notable for the following:    RBC 3.97 (*)    Hemoglobin 10.4 (*)    HCT 31.6 (*)    RDW 18.3 (*)    Platelets 138 (*)    All other components within normal limits  DIFFERENTIAL -  Abnormal; Notable for the following:    Monocytes Absolute 1.3 (*)    All other components within normal limits  COMPREHENSIVE METABOLIC PANEL - Abnormal; Notable for the following:    Sodium 133 (*)    Glucose, Bld 114 (*)    Albumin 2.8 (*)    AST 62 (*)    All other components within normal limits  AMMONIA - Abnormal; Notable for the following:    Ammonia 66 (*)    All other components within normal limits  I-STAT CHEM 8, ED - Abnormal; Notable for the following:    Chloride 100 (*)    Glucose, Bld 110 (*)    Hemoglobin 11.9 (*)    HCT 35.0 (*)    All other components within normal limits  ETHANOL  APTT  URINE RAPID DRUG SCREEN, HOSP PERFORMED  URINALYSIS, ROUTINE W REFLEX MICROSCOPIC (NOT AT [facility])  Rosezena Sensor, ED    Imaging Review Dg Chest 2 View  03/21/2015  CLINICAL DATA:  Weakness, left-sided facial droop. EXAM: CHEST  2 VIEW COMPARISON:  Chest x-ray dated 02/23/2015 and chest x-ray dated 08/10/2014. FINDINGS: Study is again hypoinspiratory with crowding of the perihilar bronchovascular markings. Probable mild scarring in the left mid lung region, unchanged. Lungs are otherwise clear given the low lung volumes. No evidence of pneumonia. No pleural effusion. Cardiomediastinal silhouette is stable in size and configuration. No osseous fracture or dislocation seen. IMPRESSION: Hypoinspiratory exam.  No acute findings. Electronically Signed   By: Bary Richard M.D.   On: 03/21/2015 14:44   Ct Head Wo Contrast  03/21/2015  CLINICAL DATA:  Generalized weakness today. Pt was lowered to the floor when he became too weak to stand. Left sided face droop. EXAM: CT HEAD WITHOUT CONTRAST TECHNIQUE: Contiguous axial images were obtained from the base of the skull through the vertex without intravenous contrast. COMPARISON:  08/03/2014 FINDINGS: There is no evidence of mass effect, midline shift, or extra-axial fluid collections. There is no evidence of a space-occupying lesion or  intracranial hemorrhage. There is no evidence of a cortical-based area of acute infarction. There is generalized cerebral atrophy. There is periventricular white matter low attenuation likely secondary to microangiopathy. The ventricles and sulci are appropriate for  the patient's age. The basal cisterns are patent. Visualized portions of the orbits are unremarkable. The visualized portions of the paranasal sinuses and mastoid air cells are unremarkable. The osseous structures are unremarkable. IMPRESSION: No acute intracranial pathology. Electronically Signed   By: Elige Ko   On: 03/21/2015 14:59   I have personally reviewed and evaluated these images and lab results as part of my medical decision-making.   EKG Interpretation   Date/Time:  Monday March 21 2015 13:53:31 EST Ventricular Rate:  67 PR Interval:  58 QRS Duration: 107 QT Interval:  404 QTC Calculation: 426 R Axis:   -36 Text Interpretation:  Sinus rhythm Short PR interval Low voltage,  precordial leads Left ventricular hypertrophy Anterior Q waves, possibly  due to LVH Borderline T abnormalities, inferior leads Artifact Confirmed  by Lincoln Brigham [phone]) on 03/21/2015 2:03:55 PM      MDM   Final diagnoses:  None    Patient with history of hepatic encephalopathy and liver disease here with generalized weakness and a fall. Patient appears encephalopathic on examination without consistent focal neurologic deficits. Presentation is not consistent with acute CVA. He has had constipation over the last couple of days that may be contributing to his encephalopathy. Family states that he has experienced waxing and waning mental status over the last month. Patient care transferred pending further workup.  Tilden Fossa, MD 03/21/15 [phone]

## 2015-03-21 NOTE — ED Notes (Signed)
Patient transported to MRI 

## 2015-03-21 NOTE — ED Provider Notes (Addendum)
  Physical Exam  BP 127/64 mmHg  Pulse 67  Temp(Src) 98.5 F (36.9 C) (Oral)  Resp 18  Ht  (1.702 m)  Wt 184 lb (83.462 kg)  BMI 28.81 kg/m2  SpO2 96%  Physical Exam  ED Course  Procedures  MDM Care assumed from Dr. Madilyn Hook at 4pm. Patient has occasional spasms and intermittent weakness. He has 24 hr aide and physical therapy at home. Has been taking lactulose 3 times daily but no BM for 2 days. Denies vomiting or abdominal pain or fever. Ammonia 66 but that is baseline and he was given lactulose here. Rectal exam showed no stool impaction and abdomen nontender. Labs at baseline. Sign out pending UA, orthostatics, MRI. MRI showed no stroke. UA unremarkable. Orthostatic initially but give IVF and ambulated well with no dizziness. Aid at bedside and wants to take patient home. Will increase lactulose to 4 times daily and have him see PCP and physical therapy. Will dc home.   Richardean Canal, MD 03/21/15 2114  Richardean Canal, MD 03/21/15 2121

## 2015-03-21 NOTE — ED Notes (Signed)
Pt arrives from home via [facility] c/o weakness.  EMS reports pt became weak en route to bathroom and was lowered to floor, no fall or injury.  EMS reports home health aid reported R sided facial droop.  EMS reports pt ambulatory with stand-by assist.  Slight L sided face droop noted, some possible L sided extinction noted.  Dr. Madilyn Hook called to bedside.

## 2015-03-21 NOTE — Discharge Instructions (Signed)
Increase lactulose to 4 times daily.  Continue your meds.   Repeat ammonia in a week.   Encourage physical therapy.   Return to ER if you have worse weakness, vomiting, unsteadiness, fevers, not passing gas, no bowel movement for a week, abdominal pain

## 2015-03-22 ENCOUNTER — Inpatient Hospital Stay: Admission: RE | Admit: 2015-03-22 | Payer: Self-pay | Source: Ambulatory Visit

## 2015-03-22 NOTE — Telephone Encounter (Signed)
Pt daughter has been notified and will call in 10 days to report on response

## 2015-03-22 NOTE — Telephone Encounter (Signed)
Needs to increase his lactulose so that he is having 2-3 bms daily, soft.  For now, ask him to double his current dose.  They should call back in 10 days to report on his response.  Thanks

## 2015-03-28 ENCOUNTER — Ambulatory Visit: Payer: Self-pay | Admitting: Gastroenterology

## 2015-03-30 ENCOUNTER — Telehealth: Payer: Self-pay | Admitting: Diagnostic Neuroimaging

## 2015-03-30 NOTE — Telephone Encounter (Signed)
Noted. -VRP 

## 2015-03-30 NOTE — Telephone Encounter (Signed)
Called and r/s 3/1 appt due to Dr. Marjory Lies being on vacation.  Spoke to pt's daughter who wanted Dr. Marjory Lies to know that he was in the ER at [facility] and had an MRI done on 1/31.  She wanted to know if he could see that or if they would need to sign a release for it.

## 2015-03-30 NOTE — Telephone Encounter (Signed)
Called home phone, did not leave message. Called daughter on cell phone. Informed her that Dr Marjory Lies can see all [facility] information because this office is in [facility] system.  She stated that the ED dr felt it was Tramadol that caused his problems that day because after being in ED for extended period of time without Tramadol, "he returned to his baseline".  She stated that she would discuss MRI, labs with dr during her father's FU on 04/19/15. She verbalized appreciation for call back.

## 2015-04-07 ENCOUNTER — Encounter (HOSPITAL_COMMUNITY): Payer: Self-pay | Admitting: Emergency Medicine

## 2015-04-07 ENCOUNTER — Emergency Department (HOSPITAL_COMMUNITY): Payer: Medicare Other

## 2015-04-07 ENCOUNTER — Inpatient Hospital Stay (HOSPITAL_COMMUNITY)
Admission: EM | Admit: 2015-04-07 | Discharge: 2015-04-14 | DRG: 442 | Disposition: A | Discharge status: Home Health | Payer: Medicare Other | Attending: Family Medicine | Admitting: Family Medicine

## 2015-04-07 DIAGNOSIS — R159 Full incontinence of feces: Secondary | ICD-10-CM | POA: Diagnosis present

## 2015-04-07 DIAGNOSIS — Z79899 Other long term (current) drug therapy: Secondary | ICD-10-CM | POA: Diagnosis not present

## 2015-04-07 DIAGNOSIS — E119 Type 2 diabetes mellitus without complications: Secondary | ICD-10-CM | POA: Diagnosis present

## 2015-04-07 DIAGNOSIS — E785 Hyperlipidemia, unspecified: Secondary | ICD-10-CM | POA: Diagnosis present

## 2015-04-07 DIAGNOSIS — F419 Anxiety disorder, unspecified: Secondary | ICD-10-CM | POA: Diagnosis present

## 2015-04-07 DIAGNOSIS — R4182 Altered mental status, unspecified: Secondary | ICD-10-CM | POA: Diagnosis present

## 2015-04-07 DIAGNOSIS — K7290 Hepatic failure, unspecified without coma: Principal | ICD-10-CM | POA: Diagnosis present

## 2015-04-07 DIAGNOSIS — D649 Anemia, unspecified: Secondary | ICD-10-CM | POA: Diagnosis present

## 2015-04-07 DIAGNOSIS — I5032 Chronic diastolic (congestive) heart failure: Secondary | ICD-10-CM | POA: Diagnosis present

## 2015-04-07 DIAGNOSIS — R32 Unspecified urinary incontinence: Secondary | ICD-10-CM | POA: Insufficient documentation

## 2015-04-07 DIAGNOSIS — R278 Other lack of coordination: Secondary | ICD-10-CM | POA: Diagnosis present

## 2015-04-07 DIAGNOSIS — Z7189 Other specified counseling: Secondary | ICD-10-CM | POA: Diagnosis not present

## 2015-04-07 DIAGNOSIS — Z8674 Personal history of sudden cardiac arrest: Secondary | ICD-10-CM

## 2015-04-07 DIAGNOSIS — M549 Dorsalgia, unspecified: Secondary | ICD-10-CM | POA: Insufficient documentation

## 2015-04-07 DIAGNOSIS — R279 Unspecified lack of coordination: Secondary | ICD-10-CM

## 2015-04-07 DIAGNOSIS — Z888 Allergy status to other drugs, medicaments and biological substances status: Secondary | ICD-10-CM | POA: Diagnosis not present

## 2015-04-07 DIAGNOSIS — I10 Essential (primary) hypertension: Secondary | ICD-10-CM | POA: Diagnosis present

## 2015-04-07 DIAGNOSIS — G9340 Encephalopathy, unspecified: Secondary | ICD-10-CM

## 2015-04-07 DIAGNOSIS — K7682 Hepatic encephalopathy: Secondary | ICD-10-CM | POA: Diagnosis present

## 2015-04-07 DIAGNOSIS — N39498 Other specified urinary incontinence: Secondary | ICD-10-CM | POA: Diagnosis not present

## 2015-04-07 DIAGNOSIS — M545 Low back pain: Secondary | ICD-10-CM | POA: Diagnosis not present

## 2015-04-07 DIAGNOSIS — F329 Major depressive disorder, single episode, unspecified: Secondary | ICD-10-CM | POA: Diagnosis present

## 2015-04-07 DIAGNOSIS — K7030 Alcoholic cirrhosis of liver without ascites: Secondary | ICD-10-CM | POA: Insufficient documentation

## 2015-04-07 DIAGNOSIS — M5136 Other intervertebral disc degeneration, lumbar region: Secondary | ICD-10-CM | POA: Diagnosis present

## 2015-04-07 DIAGNOSIS — G8929 Other chronic pain: Secondary | ICD-10-CM | POA: Diagnosis not present

## 2015-04-07 DIAGNOSIS — Z515 Encounter for palliative care: Secondary | ICD-10-CM | POA: Insufficient documentation

## 2015-04-07 LAB — DIFFERENTIAL
Basophils Absolute: 0 10*3/uL (ref 0.0–0.1)
Basophils Relative: 0 %
Eosinophils Absolute: 0.2 10*3/uL (ref 0.0–0.7)
Eosinophils Relative: 2 %
LYMPHS PCT: 29 %
Lymphs Abs: 2 10*3/uL (ref 0.7–4.0)
MONO ABS: 1.1 10*3/uL — AB (ref 0.1–1.0)
MONOS PCT: 15 %
NEUTROS ABS: 3.8 10*3/uL (ref 1.7–7.7)
Neutrophils Relative %: 54 %

## 2015-04-07 LAB — RAPID URINE DRUG SCREEN, HOSP PERFORMED
AMPHETAMINES: NOT DETECTED
BARBITURATES: NOT DETECTED
BENZODIAZEPINES: NOT DETECTED
COCAINE: NOT DETECTED
Opiates: NOT DETECTED
TETRAHYDROCANNABINOL: NOT DETECTED

## 2015-04-07 LAB — URINALYSIS, ROUTINE W REFLEX MICROSCOPIC
Bilirubin Urine: NEGATIVE
GLUCOSE, UA: NEGATIVE mg/dL
HGB URINE DIPSTICK: NEGATIVE
Ketones, ur: 15 mg/dL — AB
LEUKOCYTES UA: NEGATIVE
Nitrite: NEGATIVE
PH: 5.5 (ref 5.0–8.0)
Protein, ur: NEGATIVE mg/dL
Specific Gravity, Urine: 1.023 (ref 1.005–1.030)

## 2015-04-07 LAB — COMPREHENSIVE METABOLIC PANEL
ALK PHOS: 105 U/L (ref 38–126)
ALT: 25 U/L (ref 17–63)
AST: 48 U/L — AB (ref 15–41)
Albumin: 3 g/dL — ABNORMAL LOW (ref 3.5–5.0)
Anion gap: 12 (ref 5–15)
BUN: 15 mg/dL (ref 6–20)
CO2: 20 mmol/L — AB (ref 22–32)
CREATININE: 0.86 mg/dL (ref 0.61–1.24)
Calcium: 9.4 mg/dL (ref 8.9–10.3)
Chloride: 107 mmol/L (ref 101–111)
GFR calc non Af Amer: >60 mL/min (ref >60)
Glucose, Bld: 114 mg/dL — ABNORMAL HIGH (ref 65–99)
Potassium: 4.6 mmol/L (ref 3.5–5.1)
Sodium: 139 mmol/L (ref 135–145)
Total Bilirubin: 1.1 mg/dL (ref 0.3–1.2)
Total Protein: 7.3 g/dL (ref 6.5–8.1)

## 2015-04-07 LAB — I-STAT CHEM 8, ED
BUN: 16 mg/dL (ref 6–20)
CALCIUM ION: 1.14 mmol/L (ref 1.13–1.30)
CHLORIDE: 107 mmol/L (ref 101–111)
CREATININE: 0.8 mg/dL (ref 0.61–1.24)
GLUCOSE: 111 mg/dL — AB (ref 65–99)
HCT: 35 % — ABNORMAL LOW (ref 39.0–52.0)
Hemoglobin: 11.9 g/dL — ABNORMAL LOW (ref 13.0–17.0)
Potassium: 4.7 mmol/L (ref 3.5–5.1)
Sodium: 141 mmol/L (ref 135–145)
TCO2: 22 mmol/L (ref 0–100)

## 2015-04-07 LAB — CBC
HEMATOCRIT: 31.9 % — AB (ref 39.0–52.0)
Hemoglobin: 10.8 g/dL — ABNORMAL LOW (ref 13.0–17.0)
MCH: 27.6 pg (ref 26.0–34.0)
MCHC: 33.9 g/dL (ref 30.0–36.0)
MCV: 81.6 fL (ref 78.0–100.0)
PLATELETS: 170 10*3/uL (ref 150–400)
RBC: 3.91 MIL/uL — AB (ref 4.22–5.81)
RDW: 18.6 % — ABNORMAL HIGH (ref 11.5–15.5)
WBC: 7.1 10*3/uL (ref 4.0–10.5)

## 2015-04-07 LAB — ETHANOL: Alcohol, Ethyl (B): <5 mg/dL (ref <5)

## 2015-04-07 LAB — PROTIME-INR
INR: 1.27 (ref 0.00–1.49)
Prothrombin Time: 16.1 seconds — ABNORMAL HIGH (ref 11.6–15.2)

## 2015-04-07 LAB — I-STAT TROPONIN, ED: Troponin i, poc: 0 ng/mL (ref 0.00–0.08)

## 2015-04-07 LAB — APTT: aPTT: 39 seconds — ABNORMAL HIGH (ref 24–37)

## 2015-04-07 LAB — AMMONIA: Ammonia: 101 umol/L — ABNORMAL HIGH (ref 9–35)

## 2015-04-07 LAB — I-STAT CG4 LACTIC ACID, ED
LACTIC ACID, VENOUS: 1.41 mmol/L (ref 0.5–2.0)
LACTIC ACID, VENOUS: 1.69 mmol/L (ref 0.5–2.0)

## 2015-04-07 LAB — CBG MONITORING, ED: Glucose-Capillary: 110 mg/dL — ABNORMAL HIGH (ref 65–99)

## 2015-04-07 MED ORDER — VITAMIN B-1 100 MG PO TABS
100.0000 mg | ORAL_TABLET | Freq: Every day | ORAL | Status: DC
  Administered 2015-04-08 – 2015-04-14 (×6): 100 mg via ORAL
  Filled 2015-04-07 (×6): qty 1

## 2015-04-07 MED ORDER — LORAZEPAM 1 MG PO TABS: 1.0000 mg | ORAL_TABLET | Freq: Four times a day (QID) | ORAL | Status: AC | PRN

## 2015-04-07 MED ORDER — DISULFIRAM 250 MG PO TABS
250.0000 mg | ORAL_TABLET | Freq: Every day | ORAL | Status: DC
  Filled 2015-04-07 (×2): qty 1

## 2015-04-07 MED ORDER — THIAMINE HCL 100 MG/ML IJ SOLN
100.0000 mg | Freq: Every day | INTRAMUSCULAR | Status: DC
  Administered 2015-04-10: 100 mg via INTRAVENOUS
  Filled 2015-04-07: qty 2

## 2015-04-07 MED ORDER — INSULIN ASPART 100 UNIT/ML ~~LOC~~ SOLN
0.0000 [IU] | Freq: Three times a day (TID) | SUBCUTANEOUS | Status: DC
  Administered 2015-04-08 – 2015-04-09 (×4): 1 [IU] via SUBCUTANEOUS
  Administered 2015-04-10: 2 [IU] via SUBCUTANEOUS
  Administered 2015-04-11: 1 [IU] via SUBCUTANEOUS
  Administered 2015-04-11: 2 [IU] via SUBCUTANEOUS
  Administered 2015-04-12: 1 [IU] via SUBCUTANEOUS
  Administered 2015-04-12: 2 [IU] via SUBCUTANEOUS
  Administered 2015-04-13: 1 [IU] via SUBCUTANEOUS
  Administered 2015-04-13: 2 [IU] via SUBCUTANEOUS
  Administered 2015-04-14 (×2): 1 [IU] via SUBCUTANEOUS

## 2015-04-07 MED ORDER — FOLIC ACID 1 MG PO TABS
1.0000 mg | ORAL_TABLET | Freq: Every day | ORAL | Status: DC
  Administered 2015-04-08 – 2015-04-14 (×7): 1 mg via ORAL
  Filled 2015-04-07 (×7): qty 1

## 2015-04-07 MED ORDER — ESCITALOPRAM OXALATE 10 MG PO TABS
5.0000 mg | ORAL_TABLET | Freq: Every day | ORAL | Status: DC
  Administered 2015-04-08 – 2015-04-14 (×7): 5 mg via ORAL
  Filled 2015-04-07 (×7): qty 1

## 2015-04-07 MED ORDER — QUETIAPINE FUMARATE 25 MG PO TABS
25.0000 mg | ORAL_TABLET | ORAL | Status: DC
  Administered 2015-04-08 – 2015-04-14 (×4): 25 mg via ORAL
  Filled 2015-04-07 (×4): qty 1

## 2015-04-07 MED ORDER — VITAMIN B-1 100 MG PO TABS: 100.0000 mg | ORAL_TABLET | Freq: Every day | ORAL | Status: DC

## 2015-04-07 MED ORDER — LORAZEPAM 2 MG/ML IJ SOLN: 1.0000 mg | Freq: Four times a day (QID) | INTRAMUSCULAR | Status: AC | PRN

## 2015-04-07 MED ORDER — SPIRONOLACTONE 25 MG PO TABS
50.0000 mg | ORAL_TABLET | Freq: Every day | ORAL | Status: DC
  Administered 2015-04-08 – 2015-04-14 (×7): 50 mg via ORAL
  Filled 2015-04-07 (×7): qty 2

## 2015-04-07 MED ORDER — RIFAXIMIN 550 MG PO TABS
550.0000 mg | ORAL_TABLET | Freq: Two times a day (BID) | ORAL | Status: DC
  Administered 2015-04-07 – 2015-04-14 (×14): 550 mg via ORAL
  Filled 2015-04-07 (×14): qty 1

## 2015-04-07 MED ORDER — POLYSACCHARIDE IRON COMPLEX 150 MG PO CAPS
150.0000 mg | ORAL_CAPSULE | Freq: Every day | ORAL | Status: DC
  Administered 2015-04-08 – 2015-04-14 (×7): 150 mg via ORAL
  Filled 2015-04-07 (×7): qty 1

## 2015-04-07 MED ORDER — GABAPENTIN 300 MG PO CAPS: 300.0000 mg | ORAL_CAPSULE | Freq: Three times a day (TID) | ORAL | Status: DC

## 2015-04-07 MED ORDER — LACTULOSE 10 GM/15ML PO SOLN
20.0000 g | Freq: Four times a day (QID) | ORAL | Status: DC
  Administered 2015-04-07 – 2015-04-08 (×3): 20 g via ORAL
  Filled 2015-04-07 (×3): qty 30

## 2015-04-07 MED ORDER — LACTULOSE 10 GM/15ML PO SOLN
30.0000 g | Freq: Once | ORAL | Status: AC
  Administered 2015-04-07: 30 g via ORAL
  Filled 2015-04-07: qty 45

## 2015-04-07 MED ORDER — FUROSEMIDE 20 MG PO TABS
20.0000 mg | ORAL_TABLET | Freq: Every day | ORAL | Status: DC
  Administered 2015-04-08 – 2015-04-14 (×7): 20 mg via ORAL
  Filled 2015-04-07 (×7): qty 1

## 2015-04-07 MED ORDER — PANTOPRAZOLE SODIUM 40 MG PO TBEC
40.0000 mg | DELAYED_RELEASE_TABLET | Freq: Every day | ORAL | Status: DC
  Administered 2015-04-08 – 2015-04-14 (×7): 40 mg via ORAL
  Filled 2015-04-07 (×7): qty 1

## 2015-04-07 MED ORDER — ADULT MULTIVITAMIN W/MINERALS CH
1.0000 | ORAL_TABLET | Freq: Every day | ORAL | Status: DC
  Administered 2015-04-08 – 2015-04-14 (×7): 1 via ORAL
  Filled 2015-04-07 (×7): qty 1

## 2015-04-07 MED ORDER — CARVEDILOL 25 MG PO TABS
25.0000 mg | ORAL_TABLET | Freq: Two times a day (BID) | ORAL | Status: DC
  Administered 2015-04-07 – 2015-04-14 (×15): 25 mg via ORAL
  Filled 2015-04-07 (×15): qty 1

## 2015-04-07 MED ORDER — FOLIC ACID 1 MG PO TABS: 1.0000 mg | ORAL_TABLET | Freq: Every day | ORAL | Status: DC

## 2015-04-07 MED ORDER — LIDOCAINE 5 % EX PTCH
1.0000 | MEDICATED_PATCH | CUTANEOUS | Status: DC
  Administered 2015-04-07 – 2015-04-13 (×7): 1 via TRANSDERMAL
  Filled 2015-04-07 (×8): qty 1

## 2015-04-07 MED ORDER — POLYETHYLENE GLYCOL 3350 17 G PO PACK: 17.0000 g | PACK | Freq: Every day | ORAL | Status: DC | PRN

## 2015-04-07 MED ORDER — SODIUM CHLORIDE 0.9 % IV SOLN
INTRAVENOUS | Status: AC
  Administered 2015-04-07: 19:00:00 via INTRAVENOUS

## 2015-04-07 NOTE — ED Notes (Signed)
Per [facility], pt lives at home with daughter. Pt daughter states around 250pm pt had onset of confusion. Pt has cirrhosis of the liver, had confusion from previous elevated ammonia. Pt following commands, per EMS, no focal neuro deficits. Pt eyes are closed and answering questions inappropriately. Pt denies pain.

## 2015-04-07 NOTE — ED Notes (Signed)
[facility] states pt initially was violent, had to call PD to the house. Pt is calm and cooperative.

## 2015-04-07 NOTE — ED Notes (Signed)
EEG at bedside.

## 2015-04-07 NOTE — Progress Notes (Signed)
EEG completed; results pending.    

## 2015-04-07 NOTE — Procedures (Signed)
ELECTROENCEPHALOGRAM REPORT  Patient: Keith [name]       Room #: ED 41 EEG No. ID: [id number] Age: 75 y.o.        Sex: male Referring Physician: Pollie Meyer, B Report Date:  04/07/2015        Interpreting Physician: Aline Brochure   History: [name] is an 75 y.o. male history of cirrhosis and hepatic encephalopathy presenting with altered mental status. Ammonia level in the ED today was 101.  Indications for study:  Assess severity of encephalopathy; rule out seizure activity.  Technique: This is an 18 channel routine scalp EEG performed at the bedside with bipolar and monopolar montages arranged in accordance to the international 10/20 system of electrode placement.   Description: This EEG recording was performed during wakefulness. Patient was noted to be confused at the time of this study. Predominant background activity consisted of moderate amplitude diffuse mixed delta and theta activity with frequent triphasic sharp waves of higher amplitude, which at times appeared to be somewhat periodic. Photic stimulation was not performed. No epileptiform discharges were recorded.  Interpretation: This EEG is abnormal with moderately severe generalized continuous slowing of cerebral activity with frequent diphasic sharp wave discharges consistent with hepatic encephalopathy. Triphasic waves, however, can be seen with other metabolic encephalopathies although less. No evidence of blood from activity was recorded.   Venetia Maxon M.D. [facility] [phone]

## 2015-04-07 NOTE — ED Provider Notes (Signed)
CSN: [id number]     Arrival date & time 04/07/15  1614 History   First MD Initiated Contact with Patient 04/07/15 1623     Chief Complaint  Patient presents with  . Altered Mental Status     (Consider location/radiation/quality/duration/timing/severity/associated sxs/prior Treatment) HPI Comments: Level V caveat for altered mental status. Patient arrives by EMS with acute change in mental status around 2:45 PM. History of cirrhosis and hepatic encephalopathy in the past. Patient is somnolent but answers questions appropriately. He is oriented 2. When asked the year he gets his birthday. He is moving all his extremities. He denies any pain. Family is not yet available. No fever, chest pain, shortness of breath, abdominal pain. No apparent trauma.  Daughter has arrived. She states pain is has been confused for the past 1 month. Patient was attempting they have an outpatient lumbar spine MRI today when he became violent and agitated. He do not have the MRI done. Daughter states he was started on gabapentin yesterday for pain. He has been taken off Percocet last dose yesterday taken tramadol last dose week ago because both medications made him confused.  Patient is a 75 y.o. male presenting with altered mental status. The history is provided by the patient and the EMS personnel. The history is limited by the condition of the patient.  Altered Mental Status   Past Medical History  Diagnosis Date  . HTN (hypertension)   . Hypercholesterolemia   . Alcohol abuse 2014    states last drink 07/03/14  . A-fib (HCC) 05/2012    in the setting of acute medical illness/ respiratory arrest. Not placed on anticoagulant.   . History of GI bleed ~ 1990    bleeding from ulcer  . History of cardiac arrest 05/2012    due to tracheostomy site bleeding and airway obstruction  . Angioedema 05/2012    with acute resp failure and hypoxia due to Lisinopril  . Respiratory arrest (HCC) 05/2012    caused by  tracheostomy site bleeding and subsequent airway obstruciton.   . Anemia 05/2012, 07/2014.     normocytic 2014, macrocytic 2016  . Cirrhosis (HCC) 07/2014  . Depression   . Anxiety    Past Surgical History  Procedure Laterality Date  . Tracheostomy  05/22/12    emergent, angioedema  . Tracheostomy tube placement N/A 05/31/2012    Procedure: TRACHEOSTOMY;  Surgeon: Flo Shanks, MD;  Location: [facility] OR;  Service: ENT;  Laterality: N/A;  . Laryngoscopy N/A 05/31/2012    Procedure: LARYNGOSCOPY;  Surgeon: Flo Shanks, MD;  Location: [facility] OR;  Service: ENT;  Laterality: N/A;  . Laryngoscopy N/A 06/19/2012    Procedure: DIRECT LARYNGOSCOPY AND BRONCHOSCOPY;  Surgeon: [name], MD;  Location: [facility] OR;  Service: ENT;  Laterality: N/A;  . Tracheostomy revision N/A 06/19/2012    Procedure: TRACHEOSTOMY DECANNULATION;  Surgeon: [name], MD;  Location: [facility] OR;  Service: ENT;  Laterality: N/A;  MICROSCOPE/TELESCOPE TRACHEOSTOMY REVISION  . Esophagogastroduodenoscopy N/A 08/07/2014    Procedure: ESOPHAGOGASTRODUODENOSCOPY (EGD);  Surgeon: Rachael Fee, MD;  Location: [facility];  Service: Endoscopy;  Laterality: N/A;  . Esophagogastroduodenoscopy N/A 08/10/2014    Procedure: ESOPHAGOGASTRODUODENOSCOPY (EGD);  Surgeon: Iva Boop, MD;  Location: [facility];  Service: Endoscopy;  Laterality: N/A;  bedside   . Flexible sigmoidoscopy N/A 08/10/2014    Procedure: FLEXIBLE SIGMOIDOSCOPY;  Surgeon: Iva Boop, MD;  Location: [facility] ENDOSCOPY;  Service: Endoscopy;  Laterality: N/A;  bedside  . Esophagogastroduodenoscopy (egd) with propofol  N/A 10/07/2014    Procedure: ESOPHAGOGASTRODUODENOSCOPY (EGD)  BANDING/WITH PROPOFOL;  Surgeon: Rachael Fee, MD;  Location: [facility] ENDOSCOPY;  Service: Endoscopy;  Laterality: N/A;   Family History  Problem Relation Age of Onset  . Breast cancer Mother   . Stroke Father    Social History  Substance Use Topics  . Smoking status: Never Smoker   . Smokeless tobacco:  Never Used  . Alcohol Use: No     Comment: drinks daily. in 07/2012 drinking of 16-24 oz vodka daily reported. 09/22/14 none since 07/03/14    Review of Systems  Unable to perform ROS: Mental status change      Allergies  Lisinopril and Amlodipine  Home Medications   Prior to Admission medications   Medication Sig Start Date End Date Taking? Authorizing Provider  albuterol (PROVENTIL HFA;VENTOLIN HFA) 108 (90 BASE) MCG/ACT inhaler Inhale 2 puffs into the lungs every 6 (six) hours as needed for wheezing. 06/20/12  Yes Russella Dar, NP  carvedilol (COREG) 25 MG tablet Take 25 mg by mouth 2 (two) times daily with a meal.   Yes Historical Provider, MD  disulfiram (ANTABUSE) 250 MG tablet Take 250 mg by mouth daily.   Yes Historical Provider, MD  EPINEPHrine (EPI-PEN) 0.3 mg/0.3 mL DEVI Inject 0.3 mg into the muscle as needed (for allergic reaction).  06/03/12  Yes Leslye Peer, MD  escitalopram (LEXAPRO) 5 MG tablet Take 5 mg by mouth daily. 01/26/15  Yes Historical Provider, MD  FERREX 150 150 MG capsule Take 150 mg by mouth daily. 09/02/14  Yes Historical Provider, MD  fluticasone (FLONASE) 50 MCG/ACT nasal spray Place 1 spray into both nostrils 2 (two) times daily as needed. For nasal congestion. 07/21/14  Yes Historical Provider, MD  folic acid (FOLVITE) 1 MG tablet Take 1 tablet (1 mg total) by mouth daily. 08/16/14  Yes Kathlen Mody, MD  furosemide (LASIX) 20 MG tablet Take 20 mg by mouth daily. 07/27/14  Yes Historical Provider, MD  gabapentin (NEURONTIN) 300 MG capsule Take 1 capsule by mouth 3 (three) times daily. 04/06/15  Yes Historical Provider, MD  lactulose (CHRONULAC) 10 GM/15ML solution Take 30 mLs (20 g total) by mouth 2 (two) times daily. 01/11/15  Yes Rachael Fee, MD  Multiple Vitamins-Minerals (MULTIVITAMIN WITH MINERALS) tablet Take 1 tablet by mouth daily.   Yes Historical Provider, MD  oxyCODONE (OXY IR/ROXICODONE) 5 MG immediate release tablet Take 1 tablet by mouth 2  (two) times daily. 04/05/15  Yes Historical Provider, MD  pantoprazole (PROTONIX) 40 MG tablet Take 1 tablet (40 mg total) by mouth daily. 08/16/14  Yes Kathlen Mody, MD  QUEtiapine (SEROQUEL) 25 MG tablet Take 1 tablet (25 mg total) by mouth 2 (two) times daily. Patient taking differently: Take 25 mg by mouth every other day.  08/16/14  Yes Kathlen Mody, MD  rifaximin (XIFAXAN) 550 MG TABS tablet Take 1 tablet (550 mg total) by mouth 2 (two) times daily. 08/16/14  Yes Kathlen Mody, MD  sitaGLIPtin (JANUVIA) 100 MG tablet Take 100 mg by mouth. 03/09/15 04/08/15 Yes Historical Provider, MD  spironolactone (ALDACTONE) 50 MG tablet Take 1 tablet (50 mg total) by mouth daily. 10/07/14  Yes Rachael Fee, MD  thiamine 100 MG tablet Take 1 tablet (100 mg total) by mouth daily. 05/31/12  Yes Coralyn Helling, MD  traMADol (ULTRAM) 50 MG tablet Take 25-50 mg by mouth 2 (two) times daily as needed. pain 01/26/15  Yes Historical Provider, MD   BP  148/72 mmHg  Pulse 65  Temp(Src) 98.5 F (36.9 C) (Oral)  Resp 20  SpO2 98% Physical Exam  Constitutional: He is oriented to person, place, and time. He appears well-developed and well-nourished. No distress.  Chronically ill appearing  HENT:  Head: Normocephalic and atraumatic.  Mouth/Throat: Oropharynx is clear and moist. No oropharyngeal exudate.  Somnolent, arouses to voice, answers questions. Mild confusion, oriented 2.  Eyes: Conjunctivae and EOM are normal. Pupils are equal, round, and reactive to light.  Neck: Normal range of motion. Neck supple.  No meningismus.  Cardiovascular: Normal rate, regular rhythm, normal heart sounds and intact distal pulses.   No murmur heard. Pulmonary/Chest: Effort normal and breath sounds normal. No respiratory distress.  Abdominal: Soft. There is no tenderness. There is no rebound and no guarding.  Musculoskeletal: Normal range of motion. He exhibits no edema or tenderness.  Neurological: He is alert and oriented to person,  place, and time. No cranial nerve deficit. He exhibits normal muscle tone. Coordination normal.  Mildly tremulous. Cranial nerves II-12 intact, no facial droop, 5/5 strength throughout, moving all extremities against gravity and following commands. No ataxia on finger to nose bilaterally +asterixis. Some difficulty following commands  Skin: Skin is warm.  Psychiatric: He has a normal mood and affect. His behavior is normal.  Nursing note and vitals reviewed.   ED Course  Procedures (including critical care time) Labs Review Labs Reviewed  PROTIME-INR - Abnormal; Notable for the following:    Prothrombin Time 16.1 (*)    All other components within normal limits  APTT - Abnormal; Notable for the following:    aPTT 39 (*)    All other components within normal limits  CBC - Abnormal; Notable for the following:    RBC 3.91 (*)    Hemoglobin 10.8 (*)    HCT 31.9 (*)    RDW 18.6 (*)    All other components within normal limits  DIFFERENTIAL - Abnormal; Notable for the following:    Monocytes Absolute 1.1 (*)    All other components within normal limits  COMPREHENSIVE METABOLIC PANEL - Abnormal; Notable for the following:    CO2 20 (*)    Glucose, Bld 114 (*)    Albumin 3.0 (*)    AST 48 (*)    All other components within normal limits  URINALYSIS, ROUTINE W REFLEX MICROSCOPIC (NOT AT [facility]) - Abnormal; Notable for the following:    Color, Urine AMBER (*)    Ketones, ur 15 (*)    All other components within normal limits  AMMONIA - Abnormal; Notable for the following:    Ammonia 101 (*)    All other components within normal limits  I-STAT CHEM 8, ED - Abnormal; Notable for the following:    Glucose, Bld 111 (*)    Hemoglobin 11.9 (*)    HCT 35.0 (*)    All other components within normal limits  CBG MONITORING, ED - Abnormal; Notable for the following:    Glucose-Capillary 110 (*)    All other components within normal limits  CULTURE, BLOOD (ROUTINE X 2)  CULTURE, BLOOD  (ROUTINE X 2)  ETHANOL  URINE RAPID DRUG SCREEN, HOSP PERFORMED  MAGNESIUM  PHOSPHORUS  COMPREHENSIVE METABOLIC PANEL  CBC  PROTIME-INR  I-STAT TROPOININ, ED  I-STAT CG4 LACTIC ACID, ED  I-STAT CG4 LACTIC ACID, ED    Imaging Review Ct Head Wo Contrast  04/07/2015  CLINICAL DATA:  Headaches EXAM: CT HEAD WITHOUT CONTRAST TECHNIQUE: Contiguous axial images  were obtained from the base of the skull through the vertex without intravenous contrast. COMPARISON:  03/21/2015 FINDINGS: Bony calvarium is intact. No gross soft tissue abnormality is noted. Atrophic changes are again seen some of that noted on the prior exam. No findings to suggest acute hemorrhage, acute infarction or space-occupying mass lesion are noted. IMPRESSION: Atrophic changes stable from the prior exam. No acute abnormality noted. Electronically Signed   By: Alcide Clever M.D.   On: 04/07/2015 17:29   Dg Chest Portable 1 View  04/07/2015  CLINICAL DATA:  Altered mental status EXAM: PORTABLE CHEST 1 VIEW COMPARISON:  03/21/2015 FINDINGS: Cardiac shadow is mildly enlarged. The lungs are well aerated. Mild platelike scarring is noted in the left mid lung stable from the previous exam. No bony abnormality is seen. IMPRESSION: No active disease. Electronically Signed   By: Alcide Clever M.D.   On: 04/07/2015 18:18   I have personally reviewed and evaluated these images and lab results as part of my medical decision-making.   EKG Interpretation   Date/Time:  Thursday April 07 2015 16:25:46 EST Ventricular Rate:  71 PR Interval:  192 QRS Duration: 108 QT Interval:  418 QTC Calculation: 454 R Axis:   -39 Text Interpretation:  Sinus rhythm Left ventricular hypertrophy Anterior  infarct, old Borderline T abnormalities, inferior leads No significant  change was found Confirmed by Manus Gunning  MD, Prentiss Polio [phone]) on 04/07/2015  4:44:34 PM      MDM   Final diagnoses:  Hepatic encephalopathy (HCC)  Altered mental status,  unspecified altered mental status type   Patient with mental status change. Increased violent behavior over this afternoon. Has had confusion for the past one week.  D/w Dr. Lavon Paganini who agrees with not calling a code stroke.  CBG 111. CT head is stable.  Labs remarkable for elevated ammonia of 101. Daughter states patient does take his lactulose but has been constipated from taking pain medication.  Case discussed with neurology Dr. Lavon Paganini who has arranged for patient to have EEG. He likely has hepatic encephalopathy coupled with medication effects from gabapentin. Also seems to have undiagnosed underlying Parkinson's disease.  Admission d/w Dr. Caroleen Hamman for unassigned admission.  CRITICAL CARE Performed by: Glynn Octave Total critical care time: 35 minutes Critical care time was exclusive of separately billable procedures and treating other patients. Critical care was necessary to treat or prevent imminent or life-threatening deterioration. Critical care was time spent personally by me on the following activities: development of treatment plan with patient and/or surrogate as well as nursing, discussions with consultants, evaluation of patient's response to treatment, examination of patient, obtaining history from patient or surrogate, ordering and performing treatments and interventions, ordering and review of laboratory studies, ordering and review of radiographic studies, pulse oximetry and re-evaluation of patient's condition.    Glynn Octave, MD 04/07/15 [phone]

## 2015-04-07 NOTE — ED Notes (Signed)
Pt transported to xray 

## 2015-04-07 NOTE — Consult Note (Signed)
Requesting Physician: Dr.  Manus Gunning    Reason for consultation: altered mental status  HPI:                                                                                                                                         Keith [name] is an 75 y.o. male patient who was brought into the ER for further evaluation of altered mental status and increased agitation earlier today when he was referred for obtaining an MRI of the lumbar spine. 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 He has not been drinking for 6 months. He was diffusely on tramadol for chronic low back pain which was stopped 1 week ago due to excessive sedation and was switched to Percocet. Due to sedative side effects Percocet was  Stopped yesterday, and was prescribed gabapentin 300 mg 3 times a day. He started taking gabapentin yesterday evening. Today when he presented to the MRI facility, he was noted to have severe as station and was trying to run away from the room and not cooperative with the staff. Due to this acute change in his mental status with agitation he was brought into the ER for evaluation. His daughter noted that for the past 1-2 months his been having excessive sedation and his mental status has gradually been deteriorating. Denies any focal weakness in upper or lower extremities no new vision or speech problems.    Past Medical History: Past Medical History  Diagnosis Date  . HTN (hypertension)   . Hypercholesterolemia   . Alcohol abuse 2014    states last drink 07/03/14  . A-fib (HCC) 05/2012    in the setting of acute medical illness/ respiratory arrest. Not placed on anticoagulant.   . History of GI bleed ~ 1990    bleeding from ulcer  . History of cardiac arrest 05/2012    due to tracheostomy site bleeding and airway obstruction  . Angioedema 05/2012    with acute resp failure and hypoxia due to Lisinopril  . Respiratory arrest (HCC) 05/2012    caused by tracheostomy site  bleeding and subsequent airway obstruciton.   . Anemia 05/2012, 07/2014.     normocytic 2014, macrocytic 2016  . Cirrhosis (HCC) 07/2014  . Depression   . Anxiety     Past Surgical History  Procedure Laterality Date  . Tracheostomy  05/22/12    emergent, angioedema  . Tracheostomy tube placement N/A 05/31/2012    Procedure: TRACHEOSTOMY;  Surgeon: Flo Shanks, MD;  Location: [facility] OR;  Service: ENT;  Laterality: N/A;  . Laryngoscopy N/A 05/31/2012    Procedure: LARYNGOSCOPY;  Surgeon: Flo Shanks, MD;  Location: [facility] OR;  Service: ENT;  Laterality: N/A;  . Laryngoscopy N/A 06/19/2012    Procedure: DIRECT LARYNGOSCOPY AND BRONCHOSCOPY;  Surgeon: [name], MD;  Location: [facility] OR;  Service: ENT;  Laterality: N/A;  . Tracheostomy  revision N/A 06/19/2012    Procedure: TRACHEOSTOMY DECANNULATION;  Surgeon: [name], MD;  Location: [facility] OR;  Service: ENT;  Laterality: N/A;  MICROSCOPE/TELESCOPE TRACHEOSTOMY REVISION  . Esophagogastroduodenoscopy N/A 08/07/2014    Procedure: ESOPHAGOGASTRODUODENOSCOPY (EGD);  Surgeon: Rachael Fee, MD;  Location: [facility];  Service: Endoscopy;  Laterality: N/A;  . Esophagogastroduodenoscopy N/A 08/10/2014    Procedure: ESOPHAGOGASTRODUODENOSCOPY (EGD);  Surgeon: Iva Boop, MD;  Location: [facility];  Service: Endoscopy;  Laterality: N/A;  bedside   . Flexible sigmoidoscopy N/A 08/10/2014    Procedure: FLEXIBLE SIGMOIDOSCOPY;  Surgeon: Iva Boop, MD;  Location: [facility] ENDOSCOPY;  Service: Endoscopy;  Laterality: N/A;  bedside  . Esophagogastroduodenoscopy (egd) with propofol N/A 10/07/2014    Procedure: ESOPHAGOGASTRODUODENOSCOPY (EGD)  BANDING/WITH PROPOFOL;  Surgeon: Rachael Fee, MD;  Location: [facility] ENDOSCOPY;  Service: Endoscopy;  Laterality: N/A;    Family History: Family History  Problem Relation Age of Onset  . Breast cancer Mother   . Stroke Father     Social History:   reports that he has never smoked. He has never used smokeless tobacco.  He reports that he does not drink alcohol or use illicit drugs.  Allergies:  Allergies  Allergen Reactions  . Lisinopril Swelling    Presented with laryngeal edema (angioedema)- likely caused is lisinopril   . Amlodipine Swelling     Medications:                                                                                                                        No current facility-administered medications for this encounter.  Current outpatient prescriptions:  .  albuterol (PROVENTIL HFA;VENTOLIN HFA) 108 (90 BASE) MCG/ACT inhaler, Inhale 2 puffs into the lungs every 6 (six) hours as needed for wheezing., Disp: 1 Inhaler, Rfl: 2 .  carvedilol (COREG) 25 MG tablet, Take 25 mg by mouth 2 (two) times daily with a meal., Disp: , Rfl:  .  disulfiram (ANTABUSE) 250 MG tablet, Take 250 mg by mouth daily., Disp: , Rfl:  .  EPINEPHrine (EPI-PEN) 0.3 mg/0.3 mL DEVI, Inject 0.3 mg into the muscle as needed (for allergic reaction). , Disp: , Rfl:  .  escitalopram (LEXAPRO) 5 MG tablet, Take 5 mg by mouth daily., Disp: , Rfl: 3 .  FERREX 150 150 MG capsule, Take 150 mg by mouth daily., Disp: , Rfl: 5 .  fluticasone (FLONASE) 50 MCG/ACT nasal spray, Place 1 spray into both nostrils 2 (two) times daily as needed. For nasal congestion., Disp: , Rfl:  .  folic acid (FOLVITE) 1 MG tablet, Take 1 tablet (1 mg total) by mouth daily., Disp: , Rfl:  .  folic acid (FOLVITE) 800 MCG tablet, Take 800 mcg by mouth daily., Disp: , Rfl:  .  furosemide (LASIX) 20 MG tablet, Take 20 mg by mouth daily., Disp: , Rfl: 0 .  lactulose (CHRONULAC) 10 GM/15ML solution, Take 30 mLs (20 g total) by mouth 2 (two) times daily., Disp: 240  mL, Rfl: 0 .  Multiple Vitamins-Minerals (MULTIVITAMIN WITH MINERALS) tablet, Take 1 tablet by mouth daily., Disp: , Rfl:  .  pantoprazole (PROTONIX) 40 MG tablet, Take 1 tablet (40 mg total) by mouth daily., Disp: 30 tablet, Rfl: 0 .  QUEtiapine (SEROQUEL) 25 MG tablet, Take 1 tablet (25 mg  total) by mouth 2 (two) times daily. (Patient taking differently: Take 25 mg by mouth every other day. ), Disp: 30 tablet, Rfl: 0 .  rifaximin (XIFAXAN) 550 MG TABS tablet, Take 1 tablet (550 mg total) by mouth 2 (two) times daily., Disp: 60 tablet, Rfl: 1 .  sitaGLIPtin (JANUVIA) 100 MG tablet, Take 100 mg by mouth., Disp: , Rfl:  .  spironolactone (ALDACTONE) 50 MG tablet, Take 1 tablet (50 mg total) by mouth daily., Disp: 30 tablet, Rfl: 11 .  thiamine 100 MG tablet, Take 1 tablet (100 mg total) by mouth daily., Disp: , Rfl:  .  traMADol (ULTRAM) 50 MG tablet, Take 25-50 mg by mouth 2 (two) times daily as needed. pain, Disp: , Rfl: 1   ROS:                                                                                                                                       History obtained from the patient  General ROS: negative for - chills, fatigue, fever, night sweats, weight gain or weight loss Psychological ROS: negative for - behavioral disorder, hallucinations, memory difficulties, mood swings or suicidal ideation Ophthalmic ROS: negative for - blurry vision, double vision, eye pain or loss of vision ENT ROS: negative for - epistaxis, nasal discharge, oral lesions, sore throat, tinnitus or vertigo Allergy and Immunology ROS: negative for - hives or itchy/watery eyes Hematological and Lymphatic ROS: negative for - bleeding problems, bruising or swollen lymph nodes Endocrine ROS: negative for - galactorrhea, hair pattern changes, polydipsia/polyuria or temperature intolerance Respiratory ROS: negative for - cough, hemoptysis, shortness of breath or wheezing Cardiovascular ROS: negative for - chest pain, dyspnea on exertion, edema or irregular heartbeat Gastrointestinal ROS: negative for - abdominal pain, diarrhea, hematemesis, nausea/vomiting or stool incontinence Genito-Urinary ROS: negative for - dysuria, hematuria, incontinence or urinary frequency/urgency Musculoskeletal ROS:  negative for - joint swelling or muscular weakness Neurological ROS: as noted in HPI Dermatological ROS: negative for rash and skin lesion changes  Neurologic Examination:  Blood pressure 112/58, pulse 64, temperature 98.9 F (37.2 C), temperature source Oral, resp. rate 23, SpO2 96 %.  Evaluation of higher integrative functions including: Level of alertness: Alert,  Oriented to time, place and person Speech: fluent, no evidence of dysarthria or aphasia noted.  Test the following cranial nerves: 2-12 grossly intact Motor examination: Normal tone, bulk, full 5/5 motor strength in all 4 extremities Examination of sensation : symmetric sensation to pinprick in all 4 extremities and on face Examination of deep tendon reflexes: 2+, normal and symmetric in all extremities, normal plantars bilaterally Test coordination: Normal finger nose testing, with no evidence of limb appendicular ataxia. He has asterixis in bilateral upper extremities, and intermittent myoclonic jerks in his arms and involving the facial muscles.  Gait: Deferred   Lab Results: Basic Metabolic Panel:  Recent Labs Lab 04/07/15 1650  NA 141  K 4.7  CL 107  GLUCOSE 111*  BUN 16  CREATININE 0.80    Liver Function Tests: No results for input(s): AST, ALT, ALKPHOS, BILITOT, PROT, ALBUMIN in the last 168 hours. No results for input(s): LIPASE, AMYLASE in the last 168 hours. No results for input(s): AMMONIA in the last 168 hours.  CBC:  Recent Labs Lab 04/07/15 1640 04/07/15 1650  WBC 7.1  --   NEUTROABS 3.8  --   HGB 10.8* 11.9*  HCT 31.9* 35.0*  MCV 81.6  --   PLT 170  --     Cardiac Enzymes: No results for input(s): CKTOTAL, CKMB, CKMBINDEX, TROPONINI in the last 168 hours.  Lipid Panel: No results for input(s): CHOL, TRIG, HDL, CHOLHDL, VLDL, LDLCALC in the last 168 hours.  CBG:  Recent Labs Lab  04/07/15 1628  GLUCAP 110*    Microbiology: Results for orders placed or performed in visit on 02/23/15  Urine Culture     Status: None   Collection Time: 02/23/15 10:37 AM  Result Value Ref Range Status   Colony Count NO GROWTH  Final   Organism ID, Bacteria NO GROWTH  Final     Imaging: No results found.  Assessment and plan:   MANUEL DALL is an 75 y.o. male patient who was brought into the ER for further evaluation of acute worsening of mental status with agitation as described above. He was started on gabapentin 300 mg 3 times a day starting yesterday. His neurologic examination is suggestive of mild to moderate encephalopathy with asterixis as described. I suspect the gabapentin likely contributing to worsening of encephalopathy. He has alcoholic cirrhosis at baseline, with highly elevated ammonia 101 today in ER which is also contributing to the encephalopathy.  To rule out underlying epileptiform disorder, a stat EEG was done in the ER. It showed mild generalized background slowing suggestive of encephalopathy. No abnormal discharges or seizures noted. CT of the head did not show any acute pathology.   From neurological standpoint,Recommend discontinue gabapentin and avoid any sedative medications. He may benefit from using a lidocaine patch in the low back and a supportive back brace to help with his chronic low back pain for which she's been prescribed pain medications and gabapentin. Recommend physical therapy evaluation for low back pain and gait.  Otherwise no further neurodiagnostic testing indicated from neurological standpoint. Medical management of elevated ammonia.  Neurology service will follow up as needed. Please call if any further questions.

## 2015-04-07 NOTE — ED Notes (Signed)
Pt last alcoholic drink 6 months ago.

## 2015-04-07 NOTE — ED Notes (Signed)
Per dr Manus Gunning, not to call code stroke

## 2015-04-07 NOTE — H&P (Signed)
Family Medicine Teaching [facility] Admission History and Physical Service Pager: [phone]  Patient name: Keith [name] Medical record number: [id number] Date of birth: [birth year] Age: 75 y.o. Gender: male  Primary Care Provider: Dennis Bast, MD Consultants: Neurology Code Status: Full  Chief Complaint: Altered Mental Status  Assessment and Plan: Keith [name] is a 75 y.o. male presenting with altered mental status. PMH is significant for alcohol abuse with cirrhosis/hepatic encephalopathy/varices, anemia, HTN, HFpEF,   # Altered Mental Status:  Suspected to be secondary to Hepatic Encephalopathy. 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 Neurology consulted in ED. Ammonia 101 at admission, baseline ~60. Albumin 3. AST mildly elevated to 48, ALT normal at 25. INR stable at 1.27. Creatinine 0.8 and Bilirubin 1.1. MELD Score 9. Troponins negative. CT head stable with prior exams. CXR with no acute cardiopulmonary disease. EEG consistent with Hepatic Encephalopathy. Passed bedside swallow. Prescribed Lactulose 20g BID and Rifaximin 550mg  BID. Last bowel movement two days ago. MRI on 03/21/15 without acute intracranial abnormalities. - Admit to [facility] Medicine Teaching Service, Pollie Meyer attending - Check Magnesium, Phosphorus - AM CBC, CMP, INR - Obtain urinalysis - Follow up blood cultures - Neurology consulted, appreciate recommendations. - Lactulose 20mg  four times daily. Miralax. Continue home Rifaximin. - Due to history of varices/GI bleed, with place in SCDs  # Alcohol Use: Ethyl Alcohol level <5. Currently prescribed Disulfiram and Thiamine/Folate. Last drink reported 6months ago - CIWA Protocol - Continue home medications.   # Back Pain: Recently discontinued Tramadol and Roxicodone due to worsening confusion and constipation. Started on Gabapentin yesterday. - Holding Gabapentin per Neurology - Lidocaine Patch - Holding other  pain medication at this time. Acetaminophen (cirrhosis, hepatic encephalopathy), NSAIDs (GI bleed, varices), and most oral opioids (confusion, constipation) contraindicated, making pain management difficult. If requires pain medication, will consider low dose of morphine (0.5mg ) and will closely monitor for confusion. - Consider obtaining MRI lumbar spine during hospitalization. Was unable to obtain as outpatient today due to acute confusion. Will hold off until more oriented.  # Anemia: Hemoglobin 11.9 at admission. Other lines normal (WBC 7.1 and Platelets 170). Baseline Hemoglobin 9-10. Currently prescribed Ferrex 150mg .  - CBC in am - Continue Ferrex 150mg .  # Hypertension: Currently prescribed Coreg 25mg , Lasix 20mg , Spironolactone 50mg . - Continue home medication.  -Monitor BP  # HFpEF: Currently prescribed Coreg 25mg , Lasix 20mg , Spironolactone 50mg  daily. Last Echocardiogram 60-65% grade I diastolic dysfunction in 06/2013. - Continue home medication  # Anxiety/Depression: Currently prescribed Lexapro 5mg , Seroquel 25mg  every other day. - Continue home medication. Note in process of titrating off of Seroquel.  # Diabetes: Currently prescribed Januvia 100mg  - Holding Januvia - Sensitive Sliding Scale - Monitor CBG  FEN/GI: Heart Healthy Prophylaxis: SCD  Disposition: Home pending improved mental status  History of Present Illness:  Keith [name] is a 75 y.o. male presenting with sudden onset of confusion at 2:50pm today while in MRI. Daughter reports confusion has been increasing over the last month and then acutely worsened today, like he was having a "psychotic break".  Was attempting to have an outpatient lumbar spine MRI when he became agitated and violent--MRI was not completed. Patient returned closer to baseline since arrival at hospital.  Daughter states she went on vacation in December for one week and patient was independent and functioning normally prior. When she  returned, patient was very confused and unable to perform most ADLs. Has been working with neurology to find etiology  of confusion, but workup has been normal so far.   Has also had worsening back pain over the last month. Incontinent of urine. Denies saddle anesthesia. Per daughter, neurosurgery following and thinks incontinence is due to "pinched nerve" in back. Percocet and Tramadol were recently discontinued due to worsening confusion. Initiated on Gabapentin yesterday. Was going for MRI for further evaluation today when symptoms occurred.  Lives at home with daughter. Daughter is Chief Technology Officer. Last alcoholic drink six months ago.  Review Of Systems: Per HPI  Otherwise the remainder of the systems were negative.  Patient Active Problem List   Diagnosis Date Noted  . Alcohol abuse, in remission 09/22/2014  . Cough 09/08/2014  . Esophageal varices (HCC) 09/08/2014  . Ileus (HCC)   . Rectal varices   . Stricture, duodenum   . Esophageal varices with bleeding (HCC)   . Cirrhosis (HCC)   . SBO (small bowel obstruction) (HCC) 08/08/2014  . Leukocytosis   . Esophageal varices in alcoholic cirrhosis (HCC)   . Adynamic ileus (HCC) 08/05/2014  . Hypernatremia 08/05/2014  . GI bleed 08/04/2014  . Encephalopathy, hepatic (HCC) 08/04/2014  . Hepatic cirrhosis (HCC) 08/04/2014  . Acute blood loss anemia   . Lower GI bleed 08/03/2014  . Acute encephalopathy 08/03/2014  . Rectal bleeding 08/03/2014  . Alcohol abuse 08/03/2014  . GIB (gastrointestinal bleeding) 08/03/2014  . Chronic diastolic CHF (congestive heart failure) (HCC)   . Abdominal pain   . Atrial fibrillation (HCC) 07/15/2012  . Fever, unspecified 06/17/2012  . Airway obstruction/recurrent 06/14/2012  . Tracheostomy in place [facility]) 06/14/2012  . Fracture of rib of right side 06/14/2012  . Fracture, sternum closed 06/14/2012  . Cardiac arrest (HCC) 05/31/2012  . Bleeding 05/31/2012  . Tracheostomy complication (HCC)  05/31/2012  . Fracture of multiple ribs 05/31/2012  . Sternal fracture 05/31/2012  . Acute respiratory failure (HCC) 05/31/2012  . Laryngeal edema 05/22/2012  . Hypertension 05/22/2012  . Hyperlipidemia 05/22/2012  . Habitual alcohol use 05/22/2012    Past Medical History: Past Medical History  Diagnosis Date  . HTN (hypertension)   . Hypercholesterolemia   . Alcohol abuse 2014    states last drink 07/03/14  . A-fib (HCC) 05/2012    in the setting of acute medical illness/ respiratory arrest. Not placed on anticoagulant.   . History of GI bleed ~ 1990    bleeding from ulcer  . History of cardiac arrest 05/2012    due to tracheostomy site bleeding and airway obstruction  . Angioedema 05/2012    with acute resp failure and hypoxia due to Lisinopril  . Respiratory arrest (HCC) 05/2012    caused by tracheostomy site bleeding and subsequent airway obstruciton.   . Anemia 05/2012, 07/2014.     normocytic 2014, macrocytic 2016  . Cirrhosis (HCC) 07/2014  . Depression   . Anxiety     Past Surgical History: Past Surgical History  Procedure Laterality Date  . Tracheostomy  05/22/12    emergent, angioedema  . Tracheostomy tube placement N/A 05/31/2012    Procedure: TRACHEOSTOMY;  Surgeon: Flo Shanks, MD;  Location: [facility] OR;  Service: ENT;  Laterality: N/A;  . Laryngoscopy N/A 05/31/2012    Procedure: LARYNGOSCOPY;  Surgeon: Flo Shanks, MD;  Location: [facility] OR;  Service: ENT;  Laterality: N/A;  . Laryngoscopy N/A 06/19/2012    Procedure: DIRECT LARYNGOSCOPY AND BRONCHOSCOPY;  Surgeon: [name], MD;  Location: [facility] OR;  Service: ENT;  Laterality: N/A;  . Tracheostomy revision N/A 06/19/2012  Procedure: TRACHEOSTOMY DECANNULATION;  Surgeon: [name], MD;  Location: [facility] OR;  Service: ENT;  Laterality: N/A;  MICROSCOPE/TELESCOPE TRACHEOSTOMY REVISION  . Esophagogastroduodenoscopy N/A 08/07/2014    Procedure: ESOPHAGOGASTRODUODENOSCOPY (EGD);  Surgeon: Rachael Fee, MD;  Location: [facility]  ENDOSCOPY;  Service: Endoscopy;  Laterality: N/A;  . Esophagogastroduodenoscopy N/A 08/10/2014    Procedure: ESOPHAGOGASTRODUODENOSCOPY (EGD);  Surgeon: Iva Boop, MD;  Location: [facility] ENDOSCOPY;  Service: Endoscopy;  Laterality: N/A;  bedside   . Flexible sigmoidoscopy N/A 08/10/2014    Procedure: FLEXIBLE SIGMOIDOSCOPY;  Surgeon: Iva Boop, MD;  Location: [facility] ENDOSCOPY;  Service: Endoscopy;  Laterality: N/A;  bedside  . Esophagogastroduodenoscopy (egd) with propofol N/A 10/07/2014    Procedure: ESOPHAGOGASTRODUODENOSCOPY (EGD)  BANDING/WITH PROPOFOL;  Surgeon: Rachael Fee, MD;  Location: [facility] ENDOSCOPY;  Service: Endoscopy;  Laterality: N/A;    Social History: Social History  Substance Use Topics  . Smoking status: Never Smoker   . Smokeless tobacco: Never Used  . Alcohol Use: No     Comment: drinks daily. in 07/2012 drinking of 16-24 oz vodka daily reported. 09/22/14 none since 07/03/14   Please also refer to relevant sections of EMR.  Family History: Family History  Problem Relation Age of Onset  . Breast cancer Mother   . Stroke Father     Allergies and Medications: Allergies  Allergen Reactions  . Lisinopril Swelling    Presented with laryngeal edema (angioedema)- likely caused is lisinopril   . Amlodipine Swelling   No current facility-administered medications on file prior to encounter.   Current Outpatient Prescriptions on File Prior to Encounter  Medication Sig Dispense Refill  . albuterol (PROVENTIL HFA;VENTOLIN HFA) 108 (90 BASE) MCG/ACT inhaler Inhale 2 puffs into the lungs every 6 (six) hours as needed for wheezing. 1 Inhaler 2  . carvedilol (COREG) 25 MG tablet Take 25 mg by mouth 2 (two) times daily with a meal.    . disulfiram (ANTABUSE) 250 MG tablet Take 250 mg by mouth daily.    Marland Kitchen EPINEPHrine (EPI-PEN) 0.3 mg/0.3 mL DEVI Inject 0.3 mg into the muscle as needed (for allergic reaction).     Marland Kitchen escitalopram (LEXAPRO) 5 MG tablet Take 5 mg by mouth daily.  3   . FERREX 150 150 MG capsule Take 150 mg by mouth daily.  5  . fluticasone (FLONASE) 50 MCG/ACT nasal spray Place 1 spray into both nostrils 2 (two) times daily as needed. For nasal congestion.    . folic acid (FOLVITE) 1 MG tablet Take 1 tablet (1 mg total) by mouth daily.    . folic acid (FOLVITE) 800 MCG tablet Take 800 mcg by mouth daily.    . furosemide (LASIX) 20 MG tablet Take 20 mg by mouth daily.  0  . lactulose (CHRONULAC) 10 GM/15ML solution Take 30 mLs (20 g total) by mouth 2 (two) times daily. 240 mL 0  . Multiple Vitamins-Minerals (MULTIVITAMIN WITH MINERALS) tablet Take 1 tablet by mouth daily.    . pantoprazole (PROTONIX) 40 MG tablet Take 1 tablet (40 mg total) by mouth daily. 30 tablet 0  . QUEtiapine (SEROQUEL) 25 MG tablet Take 1 tablet (25 mg total) by mouth 2 (two) times daily. (Patient taking differently: Take 25 mg by mouth every other day. ) 30 tablet 0  . rifaximin (XIFAXAN) 550 MG TABS tablet Take 1 tablet (550 mg total) by mouth 2 (two) times daily. 60 tablet 1  . sitaGLIPtin (JANUVIA) 100 MG tablet Take 100 mg by mouth.    Marland Kitchen  spironolactone (ALDACTONE) 50 MG tablet Take 1 tablet (50 mg total) by mouth daily. 30 tablet 11  . thiamine 100 MG tablet Take 1 tablet (100 mg total) by mouth daily.    . traMADol (ULTRAM) 50 MG tablet Take 25-50 mg by mouth 2 (two) times daily as needed. pain  1    Objective: BP 110/67 mmHg  Pulse 66  Temp(Src) 98.9 F (37.2 C) (Oral)  Resp 20  SpO2 96% Exam: General: 74yo male resting comfortably in no apparent distress Eyes: Pupils dilated but equal bilaterally (baseline per daughter), EOM intact ENTM: Pharynx normal, Poor dentition Neck: Supple, no lymphadenopathy, no meningeal signs Cardiovascular: S1 and S2 noted, regular rate and rhythm Respiratory: Clear to auscultation bilaterally without wheezing or shortness of breath, auscultation via anterior approach (patient would not cooperate for posterior auscultation) Abdomen: Soft,  nondistended, nontender MSK: No edema noted, muscle strength 5/5 in lower extremities, would not cooperate for muscle strength testing of upper extremities but appeared symmetric Skin: No rashes noted Neuro: Asterixis noted, CN exam limited given lack of cooperation however EOM intact, no facial droop Psych: Oriented x1 (Person), gives birth day when asked date, alert  Labs and Imaging: CBC BMET   Recent Labs Lab 04/07/15 1640 04/07/15 1650  WBC 7.1  --   HGB 10.8* 11.9*  HCT 31.9* 35.0*  PLT 170  --     Recent Labs Lab 04/07/15 1640 04/07/15 1650  NA 139 141  K 4.6 4.7  CL 107 107  CO2 20*  --   BUN 15 16  CREATININE 0.86 0.80  GLUCOSE 114* 111*  CALCIUM 9.4  --   Ammonia 101 Bilirubin 1.1 AST 48 ALT 25  - Troponin 0 - Lactic Acid 1.69 - INR 1.27 - EtOH <5  Dg Chest 2 View  03/21/2015  CLINICAL DATA:  Weakness, left-sided facial droop. EXAM: CHEST  2 VIEW COMPARISON:  Chest x-ray dated 02/23/2015 and chest x-ray dated 08/10/2014. FINDINGS: Study is again hypoinspiratory with crowding of the perihilar bronchovascular markings. Probable mild scarring in the left mid lung region, unchanged. Lungs are otherwise clear given the low lung volumes. No evidence of pneumonia. No pleural effusion. Cardiomediastinal silhouette is stable in size and configuration. No osseous fracture or dislocation seen. IMPRESSION: Hypoinspiratory exam.  No acute findings. Electronically Signed   By: Bary Richard M.D.   On: 03/21/2015 14:44   Ct Head Wo Contrast  04/07/2015  CLINICAL DATA:  Headaches EXAM: CT HEAD WITHOUT CONTRAST TECHNIQUE: Contiguous axial images were obtained from the base of the skull through the vertex without intravenous contrast. COMPARISON:  03/21/2015 FINDINGS: Bony calvarium is intact. No gross soft tissue abnormality is noted. Atrophic changes are again seen some of that noted on the prior exam. No findings to suggest acute hemorrhage, acute infarction or space-occupying  mass lesion are noted. IMPRESSION: Atrophic changes stable from the prior exam. No acute abnormality noted. Electronically Signed   By: Alcide Clever M.D.   On: 04/07/2015 17:29   Ct Head Wo Contrast  03/21/2015  CLINICAL DATA:  Generalized weakness today. Pt was lowered to the floor when he became too weak to stand. Left sided face droop. EXAM: CT HEAD WITHOUT CONTRAST TECHNIQUE: Contiguous axial images were obtained from the base of the skull through the vertex without intravenous contrast. COMPARISON:  08/03/2014 FINDINGS: There is no evidence of mass effect, midline shift, or extra-axial fluid collections. There is no evidence of a space-occupying lesion or intracranial hemorrhage. There is no  evidence of a cortical-based area of acute infarction. There is generalized cerebral atrophy. There is periventricular white matter low attenuation likely secondary to microangiopathy. The ventricles and sulci are appropriate for the patient's age. The basal cisterns are patent. Visualized portions of the orbits are unremarkable. The visualized portions of the paranasal sinuses and mastoid air cells are unremarkable. The osseous structures are unremarkable. IMPRESSION: No acute intracranial pathology. Electronically Signed   By: Elige Ko   On: 03/21/2015 14:59   Mr Brain Wo Contrast  03/21/2015  CLINICAL DATA:  75 year old male with weakness of acute onset. Confusion. Initial encounter. EXAM: MRI HEAD WITHOUT CONTRAST TECHNIQUE: Multiplanar, multiecho pulse sequences of the brain and surrounding structures were obtained without intravenous contrast. COMPARISON:  Head CT without contrast 1449 hours today. Recent cervical spine MRI 02/23/2015 FINDINGS: Cerebral volume is within normal limits for age. No restricted diffusion to suggest acute infarction. No midline shift, mass effect, evidence of mass lesion, ventriculomegaly, extra-axial collection or acute intracranial hemorrhage. Cervicomedullary junction and  pituitary are within normal limits. Major intracranial vascular flow voids are preserved. Wallace Cullens and white matter signal is within normal limits for age throughout the brain. No cortical encephalomalacia or chronic cerebral blood products identified. Visible internal auditory structures appear normal. Mastoids are clear. Paranasal sinuses are clear. Negative orbit and scalp soft tissues. Visualized bone marrow signal is within normal limits. Visualized cervical spine appears stable since 02/23/2015. IMPRESSION: 1. No acute intracranial abnormality and negative for age noncontrast MRI appearance of the brain. 2. Stable visualized cervical spine to the recent MRI 02/23/2015 (please also see that report). Electronically Signed   By: Odessa Fleming M.D.   On: 03/21/2015 19:06   Dg Chest Portable 1 View  04/07/2015  CLINICAL DATA:  Altered mental status EXAM: PORTABLE CHEST 1 VIEW COMPARISON:  03/21/2015 FINDINGS: Cardiac shadow is mildly enlarged. The lungs are well aerated. Mild platelike scarring is noted in the left mid lung stable from the previous exam. No bony abnormality is seen. IMPRESSION: No active disease. Electronically Signed   By: Alcide Clever M.D.   On: 04/07/2015 18:18    Araceli Bouche, DO 04/07/2015, 6:18 PM PGY-2, [facility] FPTS Intern pager: [phone], text pages welcome

## 2015-04-07 NOTE — ED Notes (Signed)
Pt returned from CT °

## 2015-04-07 NOTE — ED Notes (Signed)
Family made aware of pts bed assignment 

## 2015-04-08 DIAGNOSIS — K7030 Alcoholic cirrhosis of liver without ascites: Secondary | ICD-10-CM

## 2015-04-08 DIAGNOSIS — M549 Dorsalgia, unspecified: Secondary | ICD-10-CM | POA: Insufficient documentation

## 2015-04-08 DIAGNOSIS — K7290 Hepatic failure, unspecified without coma: Principal | ICD-10-CM

## 2015-04-08 DIAGNOSIS — R32 Unspecified urinary incontinence: Secondary | ICD-10-CM

## 2015-04-08 DIAGNOSIS — R4182 Altered mental status, unspecified: Secondary | ICD-10-CM

## 2015-04-08 LAB — COMPREHENSIVE METABOLIC PANEL
ALBUMIN: 2.7 g/dL — AB (ref 3.5–5.0)
ALT: 23 U/L (ref 17–63)
AST: 42 U/L — AB (ref 15–41)
Alkaline Phosphatase: 91 U/L (ref 38–126)
Anion gap: 8 (ref 5–15)
BILIRUBIN TOTAL: 1.1 mg/dL (ref 0.3–1.2)
BUN: 14 mg/dL (ref 6–20)
CALCIUM: 9 mg/dL (ref 8.9–10.3)
CO2: 20 mmol/L — AB (ref 22–32)
CREATININE: 0.71 mg/dL (ref 0.61–1.24)
Chloride: 110 mmol/L (ref 101–111)
GFR calc Af Amer: >60 mL/min (ref >60)
GFR calc non Af Amer: >60 mL/min (ref >60)
GLUCOSE: 113 mg/dL — AB (ref 65–99)
Potassium: 4.1 mmol/L (ref 3.5–5.1)
SODIUM: 138 mmol/L (ref 135–145)
Total Protein: 6.6 g/dL (ref 6.5–8.1)

## 2015-04-08 LAB — CBC
HCT: 28.9 % — ABNORMAL LOW (ref 39.0–52.0)
Hemoglobin: 9.8 g/dL — ABNORMAL LOW (ref 13.0–17.0)
MCH: 27.5 pg (ref 26.0–34.0)
MCHC: 33.9 g/dL (ref 30.0–36.0)
MCV: 81.2 fL (ref 78.0–100.0)
PLATELETS: 145 10*3/uL — AB (ref 150–400)
RBC: 3.56 MIL/uL — ABNORMAL LOW (ref 4.22–5.81)
RDW: 18.5 % — ABNORMAL HIGH (ref 11.5–15.5)
WBC: 6.5 10*3/uL (ref 4.0–10.5)

## 2015-04-08 LAB — GLUCOSE, CAPILLARY
GLUCOSE-CAPILLARY: 147 mg/dL — AB (ref 65–99)
GLUCOSE-CAPILLARY: 142 mg/dL — AB (ref 65–99)
Glucose-Capillary: 113 mg/dL — ABNORMAL HIGH (ref 65–99)
Glucose-Capillary: 104 mg/dL — ABNORMAL HIGH (ref 65–99)
Glucose-Capillary: 137 mg/dL — ABNORMAL HIGH (ref 65–99)

## 2015-04-08 LAB — PROTIME-INR
INR: 1.35 (ref 0.00–1.49)
PROTHROMBIN TIME: 16.7 s — AB (ref 11.6–15.2)

## 2015-04-08 MED ORDER — WHITE PETROLATUM GEL
Status: AC
Start: 2015-04-08 — End: 2015-04-08
  Administered 2015-04-08: 18:00:00
  Filled 2015-04-08: qty 1

## 2015-04-08 MED ORDER — VANCOMYCIN HCL IN DEXTROSE 750-5 MG/150ML-% IV SOLN
750.0000 mg | Freq: Two times a day (BID) | INTRAVENOUS | Status: DC
  Administered 2015-04-08 – 2015-04-10 (×4): 750 mg via INTRAVENOUS
  Filled 2015-04-08 (×5): qty 150

## 2015-04-08 MED ORDER — LACTULOSE 10 GM/15ML PO SOLN
30.0000 g | Freq: Four times a day (QID) | ORAL | Status: DC
Start: 2015-04-08 — End: 2015-04-11
  Administered 2015-04-08 – 2015-04-11 (×11): 30 g via ORAL
  Filled 2015-04-08 (×11): qty 45

## 2015-04-08 MED ORDER — GLUCERNA SHAKE PO LIQD
237.0000 mL | Freq: Two times a day (BID) | ORAL | Status: DC
  Administered 2015-04-08 – 2015-04-14 (×12): 237 mL via ORAL

## 2015-04-08 MED ORDER — TRAMADOL HCL 50 MG PO TABS: 50.0000 mg | ORAL_TABLET | Freq: Four times a day (QID) | ORAL | Status: DC | PRN

## 2015-04-08 MED ORDER — TRAMADOL HCL 50 MG PO TABS
50.0000 mg | ORAL_TABLET | Freq: Once | ORAL | Status: AC
  Administered 2015-04-08: 50 mg via ORAL
  Filled 2015-04-08: qty 1

## 2015-04-08 NOTE — Progress Notes (Signed)
Pt admitted from ED per stretcher accompanied by a tech, pt oriented to unit , equipment, call light and phone within reach, treatment started, admission parkage given to pt and fall risk precaustion discuss with pt

## 2015-04-08 NOTE — Progress Notes (Addendum)
CRITICAL VALUE ALERT  Critical value received:  Blood culture from 04/07/15 positive for gram positive cocci in clusters from anaerobe bottle only thus far   Date of notification:  04/08/15   Time of notification:  1505  Critical value read back:Yes.    Nurse who received alert:  Khayla Koppenhaver   MD notified (1st page):  Family medicine Natale Milch  Time of first page:  1510  MD notified (2nd page):  Time of second page:  Responding MD:    Time MD responded:

## 2015-04-08 NOTE — Care Management Note (Addendum)
Case Management Note  Patient Details  Name: Keith [name] MRN: [id number] Date of Birth: April 15, 1940  Subjective/Objective:                 Admitted with altered mental status. PMH is significant for alcohol abuse with cirrhosis/hepatic encephalopathy/varices, anemia, HTN, HFpEF.Resides with daughter [place]. Active with [facility] services( RN,PT,OT, SLP) for home health services.   Action/Plan: Return to home when medically stable. CM to f/u with disposition needs.  Expected Discharge Date:                  Expected Discharge Plan:  Home w Home Health Services  In-House Referral:     Discharge planning Services  CM Consult  Post Acute Care Choice:  Resumption of Svcs/PTA Provider Choice offered to:     DME Arranged:    DME Agency:     HH Arranged:  RN, PT, OT, Social Work, Facilities manager Therapy (Active with [facility]) HH Agency:  [facility]  Status of Service:  Completed, signed off  Medicare Important Message Given:    Date Medicare IM Given:    Medicare IM give by:    Date Additional Medicare IM Given:    Additional Medicare Important Message give by:     If discussed at Long Length of Stay Meetings, dates discussed:    Additional Comments: CM spoke with Tiffany/[facility] and confirmed  active HH services( RN,PT,OT,SLP). Resumption orders will be needed @ d/c.  [address] (Daughter)[phone]   Gae Gallop [place], [place]  [phone] 04/08/2015, 1:57 PM

## 2015-04-08 NOTE — Progress Notes (Signed)
Initial Nutrition Assessment  DOCUMENTATION CODES:   Not applicable  INTERVENTION:   -Glucerna Shake po BID, each supplement provides 220 kcal and 10 grams of protein  NUTRITION DIAGNOSIS:   Increased nutrient needs related to chronic illness as evidenced by estimated needs.  GOAL:   Patient will meet greater than or equal to 90% of their needs  MONITOR:   PO intake, Supplement acceptance, Labs, Weight trends, Skin, I & O's  REASON FOR ASSESSMENT:   Consult  (Pt likes Glucerna)  ASSESSMENT:   Keith [name] is a 75 y.o. male presenting with sudden onset of confusion at 2:50pm today while in MRI. Daughter reports confusion has been increasing over the last month and then acutely worsened today, like he was having a "psychotic break". Was attempting to have an outpatient lumbar spine MRI when he became agitated and violent--MRI was not completed. Patient returned closer to baseline since arrival at hospital.  Pt admitted with AMS.   Pt underwent EEG on 04/07/15, which was consistent with hepatic encephalopathy.   Spoke with RN, who reports that pt's family is requesting Glucerna supplements, as pt typically consumes at home.   Hx obtained from pt's daughter at bedside. She reports pt with good, hearty appetite. She denies any weight loss and reveals that pt was recently diagnosed with DM (however, pt does not know this) and any weight loss documented is due to diet modifications of carb modified diet. Pt "loves" Glucerna supplements and typically consumes with med passes. Daughter requesting Glucerna BID, which is his typical regimen.   Nutrition-Focused physical exam completed. Findings are no fat depletion, no muscle depletion, and no edema.   Labs reviewed: AST: 42, CBGS: 104-137.   Diet Order:  Diet heart healthy/carb modified Room service appropriate?: Yes; Fluid consistency:: Thin  Skin:  Reviewed, no issues  Last BM:  04/08/15  Height:   Ht Readings from  Last 1 Encounters:  03/21/15  (1.702 m)    Weight:   Wt Readings from Last 1 Encounters:  03/21/15 184 lb (83.462 kg)    Ideal Body Weight:  67.3 kg  BMI:  Estimated body mass index is 28.81 kg/(m^2) as calculated from the following:   Height as of 03/21/15:  (1.702 m).   Weight as of 03/21/15: 184 lb (83.462 kg).  Estimated Nutritional Needs:   Kcal:  2000-2200  Protein:  105-120 grams  Fluid:  2.0-2.2 L  EDUCATION NEEDS:   No education needs identified at this time  Keith [name] A. Mayford Knife, RD, LDN, CDE Pager: [phone] After hours Pager: [phone]

## 2015-04-08 NOTE — Progress Notes (Deleted)
Pt is from home admitted with hepatic encephalopathy will go back home after discharge BARRIER TO DISCHARGE ; plan to do MRI  ADL ; one assist high fall risk

## 2015-04-08 NOTE — Progress Notes (Signed)
Pharmacy Antibiotic Note  BASIL BUFFIN is a 75 y.o. male admitted on 04/07/2015 with altered mental status.  Pharmacy has been consulted for Vancomycin  dosing.  Plan: Vancomycin  IV q12     Temp (24hrs), Avg:98.4 F (36.9 C), Min:97.7 F (36.5 C), Max:98.8 F (37.1 C)   Recent Labs Lab 04/07/15 1640 04/07/15 1650 04/07/15 1753 04/07/15 2049 04/08/15 0640  WBC 7.1  --   --   --  6.5  CREATININE 0.86 0.80  --   --  0.71  LATICACIDVEN  --   --  1.41 1.69  --     CrCl cannot be calculated (Unknown ideal weight.).    Allergies  Allergen Reactions  . Lisinopril Swelling    Presented with laryngeal edema (angioedema)- likely caused is lisinopril   . Amlodipine Swelling    Antimicrobials this admission: 2/17 vancomycin >  Dose adjustments this admission:   Microbiology results: 2/16  BCx: 1/2 GPC   Leota Sauers Pharm.D. CPP, BCPS Clinical Pharmacist [phone] 04/08/2015 5:47 PM

## 2015-04-08 NOTE — Progress Notes (Signed)
Still no new orders for pain medications, re-paged [phone] and return call from Dr. Natale Milch. Re-explained daughter concern about fathers pain management.  Dr. Natale Milch was more concerned about pt. Mental status.  Will continue to monitor and inform pt. Daughter. Forbes Cellar, RN

## 2015-04-08 NOTE — Progress Notes (Signed)
Pt. Daughter states her father is in pain and has 2 pitched nerves.  Pt. Lying in the bed uncooperative with nurse, because feels we are not doing anything about his pain.  Paged [phone] and return call from  Dr. Shawnie Pons and informed of above.  She was going to discuss with Dr. Natale Milch.  Will continue to monitor.  Forbes Cellar, RN

## 2015-04-08 NOTE — Progress Notes (Signed)
Family Medicine Teaching Service Daily Progress Note Intern Pager: [phone]  Patient name: Keith [name] Medical record number: [id number] Date of birth: 05/26/1940 Age: 75 y.o. Gender: male  Primary Care Provider: Dennis Bast, MD Consultants: Neuro Code Status: FULL  Pt Overview and Major Events to Date:  2/16 - admitted to FPTS  Assessment and Plan: Keith [name] is a 75 y.o. male presenting with altered mental status. PMH is significant for alcohol abuse with cirrhosis/hepatic encephalopathy/varices, anemia, HTN, HFpEF.  # Altered Mental Status: Suspected to be secondary to hepatic encephalopathy. History of cirrhosis and has had hepatic encephalopathy in the past. Neurology consulted in ED. Ammonia 101 at admission, baseline ~60. Albumin 3. AST mildly elevated to 48, ALT normal at 25. INR stable at 1.27. Creatinine 0.8 and bilirubin 1.1. MELD Score 9. Troponins negative. CT head stable with prior exams. CXR with no acute cardiopulmonary disease. EEG consistent with hepatic encephalopathy. Prescribed Lactulose 20g BID and Rifaximin  BID at home. Prior to admission, now BM for two days. MRI on 03/21/15 without acute intracranial abnormalities. INR 1.35. Remains oriented only to person. Had a BM overnight.  - Check Magnesium, Phosphorus - Follow up blood cultures - pending - Neurology consulted, appreciate recommendations. - Lactulose  four times daily. Miralax. Continue home Rifaximin. - Due to history of varices/GI bleed, with place in SCDs  # Alcohol Use: EtOH level <5. Currently prescribed Disulfiram and Thiamine/Folate. Last drink reported 6 months ago. - CIWA Protocol - Continue home medications   # Back Pain: Recently discontinued Tramadol and Roxicodone due to worsening confusion and constipation. Started on Gabapentin day prior to admission. - Holding Gabapentin per neurology - Lidocaine patch - Holding other pain medication at this time. Acetaminophen  (cirrhosis, hepatic encephalopathy), NSAIDs (GI bleed, varices), and most oral opioids (confusion, constipation) contraindicated, making pain management difficult. If requires pain medication, will consider low dose of morphine (0.5mg ) and will closely monitor for confusion. - Consider obtaining MRI lumbar spine during hospitalization. Was unable to obtain as outpatient today due to acute confusion. Will hold off until more oriented.  # Anemia: Hemoglobin 11.9 at admission. Other lines normal (WBC 7.1 and Platelets 170). Baseline Hemoglobin 9-10. Currently prescribed Ferrex . Hgb decreased to 9.8 this AM.  - Continue Ferrex   # Hypertension: Currently prescribed Coreg , Lasix , Spironolactone . Normotensive overnight.  - Continue home meds - Monitor BP  # HFpEF: Currently prescribed Coreg , Lasix , Spironolactone  daily. Last echo 60-65% grade I diastolic dysfunction in 06/2013. - Continue home medication  # Anxiety/Depression: Currently prescribed Lexapro , Seroquel  every other day. - Continue home medication. Note in process of titrating off of Seroquel.  # Diabetes: Currently prescribed Januvia . Glucose 113 overnight.  - Holding Januvia - Sensitive Sliding Scale - Monitor CBG  FEN/GI: Heart Healthy Prophylaxis: SCD  Disposition: home pending medical improvement  Subjective:  Patient not improved from admission. Still oriented only to person, but is very alert. Daughter feels that he is improving some, but is not back to baseline. Patient denies back pain this AM. Had one BM overnight.   Objective: Temp:  [97.7 F (36.5 C)-98.9 F (37.2 C)] 97.7 F (36.5 C) (02/17 0624) Pulse Rate:  [64-85] 76 (02/17 0810) Resp:  [18-28] 18 (02/17 0624) BP: (97-148)/(58-98) 133/67 mmHg (02/17 0810) SpO2:  [92 %-100 %] 99 % (02/17 0810) Physical Exam: General: sitting up in bed in NAD Cardiovascular: RRR, no murmurs appreciated Respiratory:  CTAB, no wheezes Abdomen:  soft, non-tender, non-distended Extremities: no edema or tenderness Neuro: oriented only to person; resting tremor of upper extremities; unable to perform more comprehensive exam as patient unable to comprehend commands   Laboratory:  Recent Labs Lab 04/07/15 1640 04/07/15 1650 04/08/15 0640  WBC 7.1  --  6.5  HGB 10.8* 11.9* 9.8*  HCT 31.9* 35.0* 28.9*  PLT 170  --  145*    Recent Labs Lab 04/07/15 1640 04/07/15 1650 04/08/15 0640  NA 139 141 138  K 4.6 4.7 4.1  CL 107 107 110  CO2 20*  --  20*  BUN CREATININE 0.86 0.80 0.71  CALCIUM 9.4  --  9.0  PROT 7.3  --  6.6  BILITOT 1.1  --  1.1  ALKPHOS 105  --  91  ALT 25  --  23  AST 48*  --  42*  GLUCOSE 114* 111* 113*    Imaging/Diagnostic Tests: Ct Head Wo Contrast 04/07/2015 IMPRESSION: Atrophic changes stable from the prior exam. No acute abnormality noted.   Dg Chest Portable 1 View 04/07/2015 IMPRESSION: No active disease.   Marquette Saa, MD 04/08/2015, 9:22 AM PGY-1, [facility] Family Medicine FPTS Intern pager: [phone], text pages welcome

## 2015-04-09 DIAGNOSIS — M549 Dorsalgia, unspecified: Secondary | ICD-10-CM

## 2015-04-09 DIAGNOSIS — G8929 Other chronic pain: Secondary | ICD-10-CM | POA: Insufficient documentation

## 2015-04-09 LAB — BASIC METABOLIC PANEL
ANION GAP: 10 (ref 5–15)
BUN: 10 mg/dL (ref 6–20)
CO2: 21 mmol/L — ABNORMAL LOW (ref 22–32)
Calcium: 9.1 mg/dL (ref 8.9–10.3)
Chloride: 110 mmol/L (ref 101–111)
Creatinine, Ser: 0.7 mg/dL (ref 0.61–1.24)
GFR calc Af Amer: >60 mL/min (ref >60)
GLUCOSE: 162 mg/dL — AB (ref 65–99)
POTASSIUM: 4.7 mmol/L (ref 3.5–5.1)
Sodium: 141 mmol/L (ref 135–145)

## 2015-04-09 LAB — CBC
HCT: 30.1 % — ABNORMAL LOW (ref 39.0–52.0)
HEMOGLOBIN: 9.7 g/dL — AB (ref 13.0–17.0)
MCH: 26.4 pg (ref 26.0–34.0)
MCHC: 32.2 g/dL (ref 30.0–36.0)
MCV: 81.8 fL (ref 78.0–100.0)
Platelets: 151 10*3/uL (ref 150–400)
RBC: 3.68 MIL/uL — AB (ref 4.22–5.81)
RDW: 18.3 % — ABNORMAL HIGH (ref 11.5–15.5)
WBC: 5.8 10*3/uL (ref 4.0–10.5)

## 2015-04-09 LAB — PHOSPHORUS: PHOSPHORUS: 3.7 mg/dL (ref 2.5–4.6)

## 2015-04-09 LAB — GLUCOSE, CAPILLARY
GLUCOSE-CAPILLARY: 140 mg/dL — AB (ref 65–99)
GLUCOSE-CAPILLARY: 122 mg/dL — AB (ref 65–99)
Glucose-Capillary: 114 mg/dL — ABNORMAL HIGH (ref 65–99)

## 2015-04-09 LAB — AMMONIA: AMMONIA: 61 umol/L — AB (ref 9–35)

## 2015-04-09 LAB — MAGNESIUM: Magnesium: 1.4 mg/dL — ABNORMAL LOW (ref 1.7–2.4)

## 2015-04-09 MED ORDER — MAGNESIUM SULFATE 4 GM/100ML IV SOLN
4.0000 g | Freq: Once | INTRAVENOUS | Status: AC
  Administered 2015-04-09: 4 g via INTRAVENOUS
  Filled 2015-04-09: qty 100

## 2015-04-09 NOTE — Progress Notes (Addendum)
NT went to get blood sugar on patient and found him bleeding from his IV site that he pulled out.  Blood was bright red and soaked through the sheets on the left side of the bed.  Patient is very confused at baseline and was unaware of what was going on.  NT called RN.  Two RNs came in to room to help clean patient up.  Vital signs obtained with blood pressure and HR stable at this time.     6:05 PM MD made aware.  Place mittens on patient before restarting iv

## 2015-04-09 NOTE — Progress Notes (Signed)
Family Medicine Teaching Service Daily Progress Note Intern Pager: [phone]  Patient name: Keith [name] Medical record number: [id number] Date of birth: 10-Nov-1940 Age: 75 y.o. Gender: male  Primary Care Provider: Dennis Bast, MD Consultants: Neuro Code Status: FULL  Pt Overview and Major Events to Date:  2/16 - admitted to FPTS 2/17 - vanc started for positive blood cx  Assessment and Plan: DARRELL HAUK is a 75 y.o. male presenting with altered mental status. PMH is significant for alcohol abuse with cirrhosis/hepatic encephalopathy/varices, anemia, HTN, HFpEF.  # Altered Mental Status: Suspected to be secondary to hepatic encephalopathy. History of cirrhosis and has had hepatic encephalopathy in the past. Neurology consulted in ED. Ammonia 101 at admission, baseline ~60. Albumin 3. AST mildly elevated to 48, ALT normal at 25. INR stable at 1.27. Creatinine 0.8 and bilirubin 1.1. MELD Score 9. Troponins negative. CT head stable with prior exams. CXR with no acute cardiopulmonary disease. EEG consistent with hepatic encephalopathy. Prescribed Lactulose 20g BID and Rifaximin  BID at home. Prior to admission, now BM for two days. MRI on 03/21/15 without acute intracranial abnormalities. Coag neg staph in one anaerobic blood cx bottle. INR 1.35. Ammonia decreased to 61 (2/18) which is supposedly patient's baseline, however he remains oriented only to person. Having multiple BM per day now. - Continue to monitor  - Lactulose  four times daily. Miralax. Continue home Rifaximin. - D/c vanc since other blood culture has no growth  # Alcohol Use: EtOH level <5. Currently prescribed Disulfiram and Thiamine/Folate. Last drink reported 6 months ago. - CIWA Protocol - Continue home medications   # Back Pain: Recently discontinued Tramadol and Roxicodone due to worsening confusion and constipation. Started on Gabapentin day prior to admission. - Holding Gabapentin per  neurology - Lidocaine patch - Holding other pain medication at this time. Acetaminophen (cirrhosis, hepatic encephalopathy), NSAIDs (GI bleed, varices), and most oral opioids (confusion, constipation) contraindicated, making pain management difficult.  - Consider obtaining MRI lumbar spine during hospitalization. Was unable to obtain as outpatient due to acute confusion. Will hold off until more oriented. - Can consider Tramadol if extreme pain  # Anemia: Hemoglobin 11.9 at admission. Other lines normal (WBC 7.1 and Platelets 170). Baseline Hemoglobin 9-10. Currently prescribed Ferrex . Hgb decreased to 9.8 (2/18).  - Continue Ferrex   # Hypertension: Currently prescribed Coreg , Lasix , Spironolactone . Hypertensive overnight, with BPs between 141-162/60-78.  - Continue home meds - Monitor BP  # Anxiety/Depression: Currently prescribed Lexapro , Seroquel  every other day. - Continue home medication. Note in process of titrating off of Seroquel.  # Diabetes: Currently prescribed Januvia . Glucose 130, 122 overnight.  - Holding Januvia - Sensitive Sliding Scale - Monitor CBG qACHS  FEN/GI: Heart Healthy Prophylaxis: SCDs  Disposition: home pending medical improvement  Subjective:  Patient oriented only to person this AM. He is able to state name but that is all. He reports that he generally feels unwell, though cannot say why. He denies pain.   Objective: Temp:  [97.3 F (36.3 C)-98.5 F (36.9 C)] 98.3 F (36.8 C) (02/19 0510) Pulse Rate:  [62-78] 64 (02/19 0510) Resp:  [16-19] 18 (02/19 0510) BP: (129-162)/(60-78) 152/78 mmHg (02/19 0510) SpO2:  [96 %-100 %] 100 % (02/19 0510) Physical Exam: General: lying in bed in NAD; refusing to open eyes, mumbling throughout encounter Cardiovascular: RRR, no murmurs appreciated Respiratory: CTAB, no wheezes Abdomen: soft, non-tender, non-distended Extremities: no edema or tenderness Neuro: oriented  to person  only; resting tremor of upper extremities; unable to perform more comprehensive exam as patient unable to comprehend commands   Laboratory:  Recent Labs Lab 04/07/15 1640 04/07/15 1650 04/08/15 0640 04/09/15 1316  WBC 7.1  --  6.5 5.8  HGB 10.8* 11.9* 9.8* 9.7*  HCT 31.9* 35.0* 28.9* 30.1*  PLT 170  --  145* 151    Recent Labs Lab 04/07/15 1640 04/07/15 1650 04/08/15 0640 04/09/15 1316  NA 139 141 138 141  K 4.6 4.7 4.1 4.7  CL 107 107 110 110  CO2 20*  --  20* 21*  BUN CREATININE 0.86 0.80 0.71 0.70  CALCIUM 9.4  --  9.0 9.1  PROT 7.3  --  6.6  --   BILITOT 1.1  --  1.1  --   ALKPHOS 105  --  91  --   ALT 25  --  23  --   AST 48*  --  42*  --   GLUCOSE 114* 111* 113* 162*    Imaging/Diagnostic Tests: Ct Head Wo Contrast 04/07/2015 IMPRESSION: Atrophic changes stable from the prior exam. No acute abnormality noted.   Dg Chest Portable 1 View 04/07/2015 IMPRESSION: No active disease.   Marquette Saa, MD 04/10/2015, 7:16 AM PGY-1, [facility] FPTS Intern pager: [phone], text pages welcome

## 2015-04-09 NOTE — Plan of Care (Signed)
Problem: Skin Integrity: Goal: Risk for impaired skin integrity will decrease Outcome: Progressing Pt is able to turn himself in the bed as he needs

## 2015-04-09 NOTE — Discharge Summary (Signed)
Family Medicine Teaching [facility] Discharge Summary  Patient name: Keith [name] Medical record number: [id number] Date of birth: [birth year] Age: 75 y.o. Gender: male Date of Admission: 04/07/2015  Date of Discharge: 04/14/2015 Admitting Physician: Latrelle Dodrill, MD  Primary Care Provider: Dennis Bast, MD Consultants: Neurology, palliative care  Indication for Hospitalization: altered mental status  Discharge Diagnoses/Problem List:  Patient Active Problem List   Diagnosis Date Noted  . Incontinence   . Chronic bilateral back pain   . Absence of bladder continence   . Notalgia   . Alcoholic cirrhosis of liver without ascites (HCC)   . Hepatic encephalopathy (HCC) 04/07/2015  . Altered mental status   . Alcohol abuse, in remission 09/22/2014  . Cough 09/08/2014  . Esophageal varices (HCC) 09/08/2014  . Ileus (HCC)   . Rectal varices   . Stricture, duodenum   . Esophageal varices with bleeding (HCC)   . Cirrhosis (HCC)   . SBO (small bowel obstruction) (HCC) 08/08/2014  . Leukocytosis   . Esophageal varices in alcoholic cirrhosis (HCC)   . Adynamic ileus (HCC) 08/05/2014  . Hypernatremia 08/05/2014  . GI bleed 08/04/2014  . Encephalopathy, hepatic (HCC) 08/04/2014  . Hepatic cirrhosis (HCC) 08/04/2014  . Acute blood loss anemia   . Lower GI bleed 08/03/2014  . Acute encephalopathy 08/03/2014  . Rectal bleeding 08/03/2014  . Alcohol abuse 08/03/2014  . GIB (gastrointestinal bleeding) 08/03/2014  . Chronic diastolic CHF (congestive heart failure) (HCC)   . Abdominal pain   . Atrial fibrillation (HCC) 07/15/2012  . Fever, unspecified 06/17/2012  . Airway obstruction/recurrent 06/14/2012  . 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 Fracture of rib of right side 06/14/2012  . Fracture, sternum closed 06/14/2012  . Cardiac arrest (HCC) 05/31/2012  . Bleeding 05/31/2012  . Tracheostomy complication (HCC) 05/31/2012  . Fracture of multiple ribs  05/31/2012  . Sternal fracture 05/31/2012  . Acute respiratory failure (HCC) 05/31/2012  . Laryngeal edema 05/22/2012  . Hypertension 05/22/2012  . Hyperlipidemia 05/22/2012  . Habitual alcohol use 05/22/2012   Disposition: home  Discharge Condition: improved  Discharge Exam:  General: sitting comfortably in bedside chair; in NAD; daughter at bedside Cardiovascular: RRR, no murmurs appreciated Respiratory: CTAB, no wheezes Abdomen: soft, non-tender, non-distended Extremities: no edema or tenderness Neuro: alert, oriented to person and place; minimal resting tremor of hands  Brief Hospital Course:  Patient presented after becomingly acutely agitated and disoriented prior to having an MRI. Per the patient's daughter, the patient has had increasing confusion over the past month, however it became acutely worse today, as if he was having a "psychotic break" as he was about to receive a spinal MRI. He was subsequently admitted for further work-up.   Altered mental status 2/2 hepatic encephalopathy On admission, patient was found to have ammonia elevated to 101 (patient's baseline is ~60) with MELD score of 9. CT head showed no abnormalities, but EEG was consistent with hepatic encephalopathy. He was started on an increased dose of lactulose, and continued on his home rifaximin. After a few days of lactulose, his ammonia returned to baseline, however patient remained oriented only to person and place. This is most likely his new baseline, and he was deemed stable for discharge home.  Patient was found to have positive blood cultures in only one anaerobic bottle, growing coag negative staph. As such, he was started on vancomycin. Patient showed no signs of infection, so vanc was discontinued after two days.   Back pain Patient was  scheduled to receive MRI lumbar spine prior to admission, however this was not completed due to patient's mental status. He complained of back pain intermittently  throughout admission, though pain seemed mostly well-controlled with lidocaine patch. Patient received MRI lumbar spine, which showed degenerative disc disease as well as nerve impingement. MRI did not show any acute process to explain patient's bowel and bladder incontinence.    Issues for Follow Up:  1. Referred patient to [facility].  2. As patient's MRI spine did not show indication for surgery, encouraged patient to continue to see neurosurgery to determine best method of pain management.  3. Continue 30g lactulose QID. Attempted to decrease dose, and patient's mental status decline. Would not recommend decreasing dose at all.   Significant Procedures: none  Significant Labs and Imaging:   Recent Labs Lab 04/07/15 1640 04/07/15 1650 04/08/15 0640 04/09/15 1316  WBC 7.1  --  6.5 5.8  HGB 10.8* 11.9* 9.8* 9.7*  HCT 31.9* 35.0* 28.9* 30.1*  PLT 170  --  145* 151    Recent Labs Lab 04/07/15 1640 04/07/15 1650 04/08/15 0640 04/09/15 1316 04/12/15 1148 04/13/15 1608  NA 139 141 138 141 141 142  K 4.6 4.7 4.1 4.7 4.4 4.4  CL 107 107 110 110 111 112*  CO2 20*  --  20* 21* 21* 21*  GLUCOSE 114* 111* 113* 162* 175* 141*  BUN CREATININE 0.86 0.80 0.71 0.70 0.85 0.82  CALCIUM 9.4  --  9.0 9.1 9.3 9.4  MG  --   --   --  1.4*  --   --   PHOS  --   --   --  3.7  --   --   ALKPHOS 105  --  91  --  95 99  AST 48*  --  42*  --  70* 70*  ALT 25  --  23  --  38 41  ALBUMIN 3.0*  --  2.7*  --  2.8* 2.7*   Ammonia - 60 (2/22), 74 (2/21), 61 (2/18), 101 (2/16), 66 (1/30), 42 (1/4), 35 (02/16/2015), 248 (09/22/2014) Lipase - 57 (02/16/2015), 24 (08/03/2014) Hepatitis B Surface Ag - negative (08/03/2014) Hepatitis B Surface Ab - reactive (08/03/2014) Hepatitis A Total Ab - Positive (08/03/2014) Hepatitis C Ab - 0.3 (08/03/2014) Bilirubin, Direct - 0.9 (08/03/2014) Bilirubin, Indirect - 1.4 (08/03/2014) Bilirubin, Total - 0.8 (04/13/2015), 1.2 (04/12/2015), 1.1  (04/08/2015), 1.1 (04/07/2015), 0.9 (03/21/2015), 1.2 (02/23/2015), 0.9 (02/16/2015), 1.2 (01/11/2015)  Results/Tests Pending at Time of Discharge: none  Discharge Medications:    Medication List    STOP taking these medications        oxyCODONE 5 MG immediate release tablet  Commonly known as:  Oxy IR/ROXICODONE      TAKE these medications        albuterol 108 (90 Base) MCG/ACT inhaler  Commonly known as:  PROVENTIL HFA;VENTOLIN HFA  Inhale 2 puffs into the lungs every 6 (six) hours as needed for wheezing.     carvedilol 25 MG tablet  Commonly known as:  COREG  Take 25 mg by mouth 2 (two) times daily with a meal.     disulfiram 250 MG tablet  Commonly known as:  ANTABUSE  Take 250 mg by mouth daily.     EPINEPHrine 0.3 mg/0.3 mL Devi  Commonly known as:  EPI-PEN  Inject 0.3 mg into the muscle as needed (for allergic reaction).     escitalopram 5  MG tablet  Commonly known as:  LEXAPRO  Take 5 mg by mouth daily.     FERREX 150 150 MG capsule  Generic drug:  iron polysaccharides  Take 150 mg by mouth daily.     fluticasone 50 MCG/ACT nasal spray  Commonly known as:  FLONASE  Place 1 spray into both nostrils 2 (two) times daily as needed. For nasal congestion.     folic acid 1 MG tablet  Commonly known as:  FOLVITE  Take 1 tablet (1 mg total) by mouth daily.     furosemide 20 MG tablet  Commonly known as:  LASIX  Take 20 mg by mouth daily.     gabapentin 300 MG capsule  Commonly known as:  NEURONTIN  Take 1 capsule by mouth 3 (three) times daily.     JANUVIA 100 MG tablet  Generic drug:  sitaGLIPtin  Take 100 mg by mouth.     lactulose 10 GM/15ML solution  Commonly known as:  CHRONULAC  Take 45 mLs (30 g total) by mouth every 6 (six) hours.     lidocaine 5 %  Commonly known as:  LIDODERM  Place 1 patch onto the skin daily. Remove & Discard patch within 12 hours or as directed by MD     multivitamin with minerals tablet  Take 1 tablet by mouth daily.      pantoprazole 40 MG tablet  Commonly known as:  PROTONIX  Take 1 tablet (40 mg total) by mouth daily.     QUEtiapine 25 MG tablet  Commonly known as:  SEROQUEL  Take 1 tablet (25 mg total) by mouth every other day.     rifaximin 550 MG Tabs tablet  Commonly known as:  XIFAXAN  Take 1 tablet (550 mg total) by mouth 2 (two) times daily.     spironolactone 50 MG tablet  Commonly known as:  ALDACTONE  Take 1 tablet (50 mg total) by mouth daily.     thiamine 100 MG tablet  Take 1 tablet (100 mg total) by mouth daily.     traMADol 50 MG tablet  Commonly known as:  ULTRAM  Take 25-50 mg by mouth 2 (two) times daily as needed. pain        Discharge Instructions: Please refer to Patient Instructions section of EMR for full details.  Patient was counseled important signs and symptoms that should prompt return to medical care, changes in medications, dietary instructions, activity restrictions, and follow up appointments.   Follow-Up Appointments: Follow-up Information    Follow up with [facility].   Why:  RN PT OT SW Aide. [facility] will call in the next 1-2 days to set up first home visit.   Contact information:   [address] [place] [address] [phone]       Follow up with CABEZA,YURI, MD. Schedule an appointment as soon as possible for a visit in 1 week.   Specialty:  Internal Medicine   Why:  For hospital follow-up   Contact information:   [address] [place] [address] [phone]       Marquette Saa, MD 04/14/2015, 2:09 PM PGY-1, [facility]

## 2015-04-09 NOTE — Progress Notes (Signed)
PT Cancellation Note  Patient Details Name: JANCARLO BIERMANN MRN: [id number] DOB: [birth year]   Cancelled Treatment:    Reason Eval/Treat Not Completed: Patient declined, no reason specified. PT eval attempted x 2 today. Pt very difficult to arouse and refusing to open eyes. He would only repeat "please stop, please stop." PT to re-attempt eval tomorrow.   Ilda Foil 04/09/2015, 4:22 PM

## 2015-04-09 NOTE — Progress Notes (Signed)
Family Medicine Teaching Service Daily Progress Note Intern Pager: [phone]  Patient name: Keith [name] Medical record number: [id number] Date of birth: May 14, 1940 Age: 75 y.o. Gender: male  Primary Care Provider: Dennis Bast, MD Consultants: Neuro Code Status: FULL  Pt Overview and Major Events to Date:  2/16 - admitted to FPTS  Assessment and Plan: Keith [name] is a 75 y.o. male presenting with altered mental status. PMH is significant for alcohol abuse with cirrhosis/hepatic encephalopathy/varices, anemia, HTN, HFpEF.  # Altered Mental Status: Suspected to be secondary to hepatic encephalopathy. History of cirrhosis and has had hepatic encephalopathy in the past. Neurology consulted in ED. Ammonia 101 at admission, baseline ~60. Albumin 3. AST mildly elevated to 48, ALT normal at 25. INR stable at 1.27. Creatinine 0.8 and bilirubin 1.1. MELD Score 9. Troponins negative. CT head stable with prior exams. CXR with no acute cardiopulmonary disease. EEG consistent with hepatic encephalopathy. Prescribed Lactulose 20g BID and Rifaximin  BID at home. Prior to admission, now BM for two days. MRI on 03/21/15 without acute intracranial abnormalities. INR 1.35. Remains oriented only to person. Had a BM overnight.  - Check Magnesium, Phosphorus - Follow up blood cultures - NGx1d - Neurology consulted, appreciate recommendations. - Lactulose  four times daily. Miralax. Continue home Rifaximin.  # Alcohol Use: EtOH level <5. Currently prescribed Disulfiram and Thiamine/Folate. Last drink reported 6 months ago. - CIWA Protocol - Continue home medications   # Back Pain: Recently discontinued Tramadol and Roxicodone due to worsening confusion and constipation. Started on Gabapentin day prior to admission. - Holding Gabapentin per neurology - Lidocaine patch - Holding other pain medication at this time. Acetaminophen (cirrhosis, hepatic encephalopathy), NSAIDs (GI bleed,  varices), and most oral opioids (confusion, constipation) contraindicated, making pain management difficult. If requires pain medication, will consider low dose of morphine (0.5mg ) and will closely monitor for confusion. - Consider obtaining MRI lumbar spine during hospitalization. Was unable to obtain as outpatient today due to acute confusion. Will hold off until more oriented.  # Anemia: Hemoglobin 11.9 at admission. Other lines normal (WBC 7.1 and Platelets 170). Baseline Hemoglobin 9-10. Currently prescribed Ferrex . Hgb decreased to 9.8 this AM.  - Continue Ferrex   # Hypertension: Currently prescribed Coreg , Lasix , Spironolactone . Normotensive overnight.  - Continue home meds - Monitor BP  # Anxiety/Depression: Currently prescribed Lexapro , Seroquel  every other day. - Continue home medication. Note in process of titrating off of Seroquel.  # Diabetes: Currently prescribed Januvia . Glucose 113 overnight.  - Holding Januvia - Sensitive Sliding Scale - Monitor CBG qACHS  FEN/GI: Heart Healthy Prophylaxis: SCDs  Disposition: home pending medical improvement  Subjective:  Patient not improved from admission. Per [facility] aide he is at baseline, he was completely disoriented today.  Objective: Temp:  [98 F (36.7 C)-98.8 F (37.1 C)] 98.8 F (37.1 C) (02/18 0500) Pulse Rate:  [66-76] 73 (02/18 0500) Resp:  [15-18] 15 (02/18 0500) BP: (125-146)/(56-69) 125/69 mmHg (02/18 0500) SpO2:  [96 %-99 %] 97 % (02/18 0500) Physical Exam: General: lying in bed in NAD Cardiovascular: RRR, no murmurs appreciated Respiratory: CTAB, no wheezes Abdomen: soft, non-tender, non-distended Extremities: no edema or tenderness Neuro: oriented x0; resting tremor of upper extremities; unable to perform more comprehensive exam as patient unable to comprehend commands   Laboratory:  Recent Labs Lab 04/07/15 1640 04/07/15 1650 04/08/15 0640  WBC 7.1  --  6.5   HGB 10.8* 11.9* 9.8*  HCT 31.9* 35.0*  28.9*  PLT 170  --  145*    Recent Labs Lab 04/07/15 1640 04/07/15 1650 04/08/15 0640  NA 139 141 138  K 4.6 4.7 4.1  CL 107 107 110  CO2 20*  --  20*  BUN CREATININE 0.86 0.80 0.71  CALCIUM 9.4  --  9.0  PROT 7.3  --  6.6  BILITOT 1.1  --  1.1  ALKPHOS 105  --  91  ALT 25  --  23  AST 48*  --  42*  GLUCOSE 114* 111* 113*    Imaging/Diagnostic Tests: Ct Head Wo Contrast 04/07/2015 IMPRESSION: Atrophic changes stable from the prior exam. No acute abnormality noted.   Dg Chest Portable 1 View 04/07/2015 IMPRESSION: No active disease.   Abram Sander, MD 04/09/2015, 9:03 AM PGY-3, [place] [facility] FPTS Intern pager: [phone], text pages welcome

## 2015-04-10 DIAGNOSIS — N39498 Other specified urinary incontinence: Secondary | ICD-10-CM

## 2015-04-10 LAB — GLUCOSE, CAPILLARY
GLUCOSE-CAPILLARY: 130 mg/dL — AB (ref 65–99)
GLUCOSE-CAPILLARY: 176 mg/dL — AB (ref 65–99)
GLUCOSE-CAPILLARY: 112 mg/dL — AB (ref 65–99)
GLUCOSE-CAPILLARY: 113 mg/dL — AB (ref 65–99)
Glucose-Capillary: 106 mg/dL — ABNORMAL HIGH (ref 65–99)

## 2015-04-10 MED ORDER — TRAMADOL HCL 50 MG PO TABS
50.0000 mg | ORAL_TABLET | Freq: Once | ORAL | Status: AC
  Administered 2015-04-10: 50 mg via ORAL
  Filled 2015-04-10: qty 1

## 2015-04-10 NOTE — Evaluation (Addendum)
Physical Therapy Evaluation Patient Details Name: Keith [name] MRN: [id number] DOB: 1940-10-30 Today's Date: 04/10/2015   History of Present Illness  75 y.o. male presenting with altered mental status, suspected to be secondary to hepatic encephalopathy. PMH is significant for alcohol abuse with cirrhosis/hepatic encephalopathy/varices, anemia, HTN, and GI bleed.  Clinical Impression  Pt admitted with above diagnosis. Pt currently with functional limitations due to the deficits listed below (see PT Problem List). Pt very lethargic and self limiting on eval. He is confused and inconsistently following commands. Pt will benefit from skilled PT to increase their independence and safety with mobility to allow discharge to the venue listed below.       Follow Up Recommendations Home health PT;Supervision/Assistance - 24 hour    Equipment Recommendations  None recommended by PT    Recommendations for Other Services       Precautions / Restrictions Precautions Precautions: Fall Restrictions Weight Bearing Restrictions: No      Mobility  Bed Mobility Overal bed mobility: Needs Assistance Bed Mobility: Supine to Sit     Supine to sit: Mod assist;+2 for physical assistance;HOB elevated     General bed mobility comments: Increased assist required due to pt lethargy.  Transfers Overall transfer level: Needs assistance Equipment used: Rolling walker (2 wheeled) Transfers: Sit to/from [employer] Sit to Stand: Min assist;+2 safety/equipment Stand pivot transfers: Min assist;+2 safety/equipment       General transfer comment: Pt is impulsive. Verbal cues required for sequencing and encouragement to participate.  Ambulation/Gait             General Gait Details: Pt declining gait on eval.  Stairs            Wheelchair Mobility    Modified Rankin (Stroke Patients Only)       Balance                                              Pertinent Vitals/Pain Pain Assessment: No/denies pain    Home Living Family/patient expects to be discharged to:: Private residence Living Arrangements: Children Available Help at Discharge: Family;Personal care attendant Type of Home: House Home Access: Stairs to enter   Secretary/administrator of Steps: 1 Home Layout: One level Home Equipment: Environmental consultant - 2 wheels      Prior Function Level of Independence: Needs assistance   Gait / Transfers Assistance Needed: Ambulatory with RW.  ADL's / Homemaking Assistance Needed: Has PCA to assist with ADLs        Hand Dominance   Dominant Hand: Right    Extremity/Trunk Assessment   Upper Extremity Assessment: Generalized weakness           Lower Extremity Assessment: Generalized weakness      Cervical / Trunk Assessment: Kyphotic  Communication   Communication: No difficulties  Cognition Arousal/Alertness: Lethargic Behavior During Therapy: Impulsive;Flat affect Overall Cognitive Status: No family/caregiver present to determine baseline cognitive functioning Area of Impairment: Orientation;Attention;Memory;Following commands;Safety/judgement;Awareness;Problem solving Orientation Level: Disoriented to;Place;Time;Situation Current Attention Level: Sustained Memory: Decreased short-term memory Following Commands: Follows one step commands inconsistently Safety/Judgement: Decreased awareness of safety Awareness: Emergent Problem Solving: Requires verbal cues;Slow processing;Decreased initiation;Difficulty sequencing;Requires tactile cues      General Comments      Exercises        Assessment/Plan    PT Assessment Patient needs continued PT services  PT  Diagnosis Difficulty walking;Generalized weakness;Altered mental status   PT Problem List Decreased strength;Decreased activity tolerance;Decreased balance;Decreased mobility;Decreased knowledge of precautions;Decreased safety awareness;Decreased  cognition  PT Treatment Interventions DME instruction;Gait training;Stair training;Functional mobility training;Therapeutic activities;Therapeutic exercise;Patient/family education;Balance training;Cognitive remediation   PT Goals (Current goals can be found in the Care Plan section) Acute Rehab PT Goals Patient Stated Goal: not stated PT Goal Formulation: Patient unable to participate in goal setting Time For Goal Achievement: 04/24/15 Potential to Achieve Goals: Fair    Frequency Min 3X/week   Barriers to discharge        Co-evaluation               End of Session Equipment Utilized During Treatment: Gait belt Activity Tolerance: Patient limited by lethargy Patient left: in chair;with family/visitor present;with call bell/phone within reach;with chair alarm set Nurse Communication: Mobility status         Time: 1202-1226 PT Time Calculation (min) (ACUTE ONLY): 24 min   Charges:   PT Evaluation $PT Eval High Complexity: 1 Procedure PT Treatments $Therapeutic Activity: 8-22 mins   PT G Codes:        Ilda Foil 04/10/2015, 2:33 PM

## 2015-04-10 NOTE — Progress Notes (Signed)
Family Medicine Teaching Service Daily Progress Note Intern Pager: [phone]  Patient name: Keith [name] Medical record number: [id number] Date of birth: Nov 10, 1940 Age: 75 y.o. Gender: male  Primary Care Provider: Dennis Bast, MD Consultants: Neuro Code Status: FULL  Pt Overview and Major Events to Date:  2/16 - admitted to FPTS 2/17 - vanc started for positive blood cx 2/19 - vanc d/ced  Assessment and Plan: Keith [name] is a 75 y.o. male presenting with altered mental status. PMH is significant for alcohol abuse with cirrhosis/hepatic encephalopathy/varices, anemia, HTN, HFpEF.  # Altered Mental Status: Suspected to be secondary to hepatic encephalopathy. History of cirrhosis and has had hepatic encephalopathy in the past. Ammonia 101 at admission, baseline ~60. Albumin 3. MELD Score 9 on admission. EEG consistent with hepatic encephalopathy. Prescribed Lactulose 20g BID and Rifaximin  BID at home.Coag neg staph in one anaerobic blood cx bottle; s/p two days vanc, but discontinued as most likely contaminant. Ammonia decreased to 61 (2/18) which is supposedly patient's baseline, however he remains oriented only to person and place. Having multiple BM per day now. Patient is likely at new mental status baseline.  - Continue to monitor  - Decrease Lactulose to  TID.  - Continue Miralax and home Rifaximin.  # Alcohol Use: EtOH level <5. Currently prescribed Disulfiram and Thiamine/Folate. Last drink reported 6 months ago. - CIWA Protocol - Continue home medications   # Back Pain: Recently discontinued Tramadol and Roxicodone due to worsening confusion and constipation. Started on Gabapentin day prior to admission. - Holding Gabapentin per neurology - Lidocaine patch - Holding other pain medication at this time. Acetaminophen (cirrhosis, hepatic encephalopathy), NSAIDs (GI bleed, varices), and most oral opioids (confusion, constipation) contraindicated, making pain  management difficult.  - Can consider Tramadol if extreme pain - F/u MRI lumbar spine  # Anemia: Hemoglobin 11.9 at admission. Other lines normal (WBC 7.1 and Platelets 170). Baseline Hemoglobin 9-10. Currently prescribed Ferrex . Hgb decreased to 9.8 (2/18).  - Continue Ferrex   # Hypertension: Currently prescribed Coreg , Lasix , Spironolactone . Normotensive overnight (132-137/63-69) - Continue home meds - Monitor BP  # Anxiety/Depression: Currently prescribed Lexapro , Seroquel  every other day. - Continue home medication. Note in process of titrating off of Seroquel.  # Diabetes: Currently prescribed Januvia . Glucose 113 overnight.  - Holding Januvia - Sensitive Sliding Scale - Monitor CBG qACHS  FEN/GI: Heart Healthy Prophylaxis: SCDs  Disposition: likely home 2/21 when daughter (primary caretaker) is back in town  Subjective:  Patient is more oriented today than on prior exam, and is aware of his location. He denies any pain this AM, and would like to go home.   Objective: Temp:  [98.4 F (36.9 C)-98.5 F (36.9 C)] 98.4 F (36.9 C) (02/20 0531) Pulse Rate:  [63-70] 63 (02/20 0531) Resp:  [16-20] 18 (02/20 0531) BP: (119-137)/(61-69) 137/69 mmHg (02/20 0531) SpO2:  [99 %-100 %] 100 % (02/20 0531) Weight:  [184 lb (83.462 kg)] 184 lb (83.462 kg) (02/19 1800) Physical Exam: General: sitting up in bed resting comfortably; in NAD Cardiovascular: RRR, no murmurs appreciated Respiratory: CTAB, no wheezes Abdomen: soft, non-tender, non-distended Extremities: no edema or tenderness Neuro: alert, oriented only to person and place; minimal resting tremor much improved from prior exams; no focal deficits  Laboratory:  Recent Labs Lab 04/07/15 1640 04/07/15 1650 04/08/15 0640 04/09/15 1316  WBC 7.1  --  6.5 5.8  HGB 10.8* 11.9* 9.8* 9.7*  HCT 31.9* 35.0* 28.9*  30.1*  PLT 170  --  145* 151    Recent Labs Lab 04/07/15 1640  04/07/15 1650 04/08/15 0640 04/09/15 1316  NA 139 141 138 141  K 4.6 4.7 4.1 4.7  CL 107 107 110 110  CO2 20*  --  20* 21*  BUN CREATININE 0.86 0.80 0.71 0.70  CALCIUM 9.4  --  9.0 9.1  PROT 7.3  --  6.6  --   BILITOT 1.1  --  1.1  --   ALKPHOS 105  --  91  --   ALT 25  --  23  --   AST 48*  --  42*  --   GLUCOSE 114* 111* 113* 162*    Imaging/Diagnostic Tests: Ct Head Wo Contrast 04/07/2015 IMPRESSION: Atrophic changes stable from the prior exam. No acute abnormality noted.   Dg Chest Portable 1 View 04/07/2015 IMPRESSION: No active disease.   Marquette Saa, MD 04/11/2015, 9:28 AM PGY-1, [facility] Family Medicine FPTS Intern pager: [phone], text pages welcome

## 2015-04-11 ENCOUNTER — Ambulatory Visit: Payer: Medicare Other | Admitting: Sports Medicine

## 2015-04-11 DIAGNOSIS — R32 Unspecified urinary incontinence: Secondary | ICD-10-CM | POA: Insufficient documentation

## 2015-04-11 LAB — GLUCOSE, CAPILLARY
GLUCOSE-CAPILLARY: 126 mg/dL — AB (ref 65–99)
GLUCOSE-CAPILLARY: 122 mg/dL — AB (ref 65–99)
Glucose-Capillary: 98 mg/dL (ref 65–99)
Glucose-Capillary: 163 mg/dL — ABNORMAL HIGH (ref 65–99)

## 2015-04-11 LAB — CULTURE, BLOOD (ROUTINE X 2)

## 2015-04-11 MED ORDER — LACTULOSE 10 GM/15ML PO SOLN
30.0000 g | Freq: Three times a day (TID) | ORAL | Status: DC
  Administered 2015-04-11 – 2015-04-12 (×3): 30 g via ORAL
  Filled 2015-04-11 (×3): qty 45

## 2015-04-11 NOTE — Progress Notes (Signed)
Patient pulled out iv today... Patient is not getting anything iv at this time.  Will leave iv out for now

## 2015-04-11 NOTE — Care Management Important Message (Signed)
Important Message  Patient Details  Name: Keith [name] MRN: [id number] Date of Birth: 1940-06-02   Medicare Important Message Given:  Yes    Bernadette Hoit 04/11/2015, 11:32 AM

## 2015-04-11 NOTE — Progress Notes (Signed)
Patient had a good day overall.  Patient was more alert and oriented throughout the day.  No visitors from daughter or private sitter.  Patient responded to questions and was able to give pratcial answers.

## 2015-04-11 NOTE — Progress Notes (Signed)
Family Medicine Teaching Service Daily Progress Note Intern Pager: [phone]  Patient name: Keith [name] Medical record number: [id number] Date of birth: 1940/08/05 Age: 75 y.o. Gender: male  Primary Care Provider: Dennis Bast, MD Consultants: Neuro Code Status: FULL  Pt Overview and Major Events to Date:  2/16 - admitted to FPTS 2/17 - vanc started for positive blood cx 2/19 - vanc d/ced 2/20 - lactulose dose decreased  Assessment and Plan: Keith [name] is a 75 y.o. male presenting with altered mental status. PMH is significant for alcohol abuse with cirrhosis/hepatic encephalopathy/varices, anemia, HTN, HFpEF.  # Altered Mental Status: Suspected to be secondary to hepatic encephalopathy. History of cirrhosis and has had hepatic encephalopathy in the past. Ammonia 101 at admission, baseline ~60. Albumin 3. MELD Score 9 on admission. EEG consistent with hepatic encephalopathy. Prescribed Lactulose 20g BID and Rifaximin  BID at home.Coag neg staph in one anaerobic blood cx bottle; s/p two days vanc, but discontinued as most likely contaminant. Ammonia decreased to 61 (2/18) which is supposedly patient's baseline, however he is not even oriented to person today. Having multiple BM per day now. Patient is likely at new mental status baseline.  - Continue to monitor  - Continue Lactulose at decreased dose of  TID  - Continue Miralax and home Rifaximin.  # Alcohol Use: EtOH level <5. Currently prescribed Disulfiram and Thiamine/Folate. Last drink reported 6 months ago. - CIWA Protocol - Continue home medications   # Back Pain: Recently discontinued Tramadol and Roxicodone due to worsening confusion and constipation. Started on Gabapentin day prior to admission. Attempted to perform MRI overnight, however patient could not cooperate and remain still during scan.  - Holding Gabapentin per neurology - Lidocaine patch - Holding other pain medication at this time.  Acetaminophen (cirrhosis, hepatic encephalopathy), NSAIDs (GI bleed, varices), and most oral opioids (confusion, constipation) contraindicated, making pain management difficult.  - Can consider Tramadol if extreme pain - As patient presented for altered mental status, sedation for MRI not recommended. Outpatient MRI lumbar spine best option at this time.   # Anemia: Hemoglobin 11.9 at admission. Other lines normal (WBC 7.1 and Platelets 170). Baseline Hemoglobin 9-10. Currently prescribed Ferrex . Hgb decreased to 9.8 (2/18).  - Continue Ferrex   # Hypertension: Currently prescribed Coreg , Lasix , Spironolactone . Normotensive overnight. - Continue home meds - Monitor BP  # Anxiety/Depression: Currently prescribed Lexapro , Seroquel  every other day. - Continue home medication. Note in process of titrating off of Seroquel.  # Diabetes: Currently prescribed Januvia . Glucose 122, 98 overnight.  - Holding Januvia - Sensitive Sliding Scale - Monitor CBG qACHS  FEN/GI: Heart Healthy Prophylaxis: SCDs  Disposition: likely home today when daughter (primary caretaker) is back in town  Subjective:  Patient with no complaints today. He remains alert, but is not even oriented to person today. His tremor is also worse.   Objective: Temp:  [97.7 F (36.5 C)-99.2 F (37.3 C)] 97.7 F (36.5 C) (02/21 0511) Pulse Rate:  [65-69] 69 (02/21 0901) Resp:  [18-20] 18 (02/21 0511) BP: (128-152)/(69-75) 137/69 mmHg (02/21 0901) SpO2:  [95 %-100 %] 95 % (02/21 0511) Physical Exam: General: resting comfortably in bed; in NAD Cardiovascular: RRR, no murmurs appreciated Respiratory: CTAB, no wheezes Abdomen: soft, non-tender, non-distended Extremities: no edema or tenderness Neuro: alert, not oriented to person, place, or time; tremor of lower lip, worsened resting tremor of hands; unable to perform thorough neuro exam as patient cannot comprehend  commands  Laboratory:  Recent Labs Lab 04/07/15 1640 04/07/15 1650 04/08/15 0640 04/09/15 1316  WBC 7.1  --  6.5 5.8  HGB 10.8* 11.9* 9.8* 9.7*  HCT 31.9* 35.0* 28.9* 30.1*  PLT 170  --  145* 151    Recent Labs Lab 04/07/15 1640 04/07/15 1650 04/08/15 0640 04/09/15 1316  NA 139 141 138 141  K 4.6 4.7 4.1 4.7  CL 107 107 110 110  CO2 20*  --  20* 21*  BUN CREATININE 0.86 0.80 0.71 0.70  CALCIUM 9.4  --  9.0 9.1  PROT 7.3  --  6.6  --   BILITOT 1.1  --  1.1  --   ALKPHOS 105  --  91  --   ALT 25  --  23  --   AST 48*  --  42*  --   GLUCOSE 114* 111* 113* 162*    Imaging/Diagnostic Tests: Ct Head Wo Contrast 04/07/2015 IMPRESSION: Atrophic changes stable from the prior exam. No acute abnormality noted.   Dg Chest Portable 1 View 04/07/2015 IMPRESSION: No active disease.   Keith Saa, MD 04/12/2015, 9:23 AM PGY-1, [place] [facility] FPTS Intern pager: [phone], text pages welcome

## 2015-04-12 LAB — COMPREHENSIVE METABOLIC PANEL
ALBUMIN: 2.8 g/dL — AB (ref 3.5–5.0)
ALK PHOS: 95 U/L (ref 38–126)
ALT: 38 U/L (ref 17–63)
ANION GAP: 9 (ref 5–15)
AST: 70 U/L — ABNORMAL HIGH (ref 15–41)
BILIRUBIN TOTAL: 1.2 mg/dL (ref 0.3–1.2)
BUN: 12 mg/dL (ref 6–20)
CALCIUM: 9.3 mg/dL (ref 8.9–10.3)
CO2: 21 mmol/L — ABNORMAL LOW (ref 22–32)
Chloride: 111 mmol/L (ref 101–111)
Creatinine, Ser: 0.85 mg/dL (ref 0.61–1.24)
GFR calc Af Amer: >60 mL/min (ref >60)
GLUCOSE: 175 mg/dL — AB (ref 65–99)
POTASSIUM: 4.4 mmol/L (ref 3.5–5.1)
Sodium: 141 mmol/L (ref 135–145)
TOTAL PROTEIN: 6.7 g/dL (ref 6.5–8.1)

## 2015-04-12 LAB — GLUCOSE, CAPILLARY
GLUCOSE-CAPILLARY: 98 mg/dL (ref 65–99)
GLUCOSE-CAPILLARY: 160 mg/dL — AB (ref 65–99)
GLUCOSE-CAPILLARY: 123 mg/dL — AB (ref 65–99)
GLUCOSE-CAPILLARY: 108 mg/dL — AB (ref 65–99)

## 2015-04-12 LAB — CULTURE, BLOOD (ROUTINE X 2): CULTURE: NO GROWTH

## 2015-04-12 LAB — AMMONIA: Ammonia: 74 umol/L — ABNORMAL HIGH (ref 9–35)

## 2015-04-12 MED ORDER — TRAMADOL HCL 50 MG PO TABS
50.0000 mg | ORAL_TABLET | Freq: Once | ORAL | Status: AC
  Administered 2015-04-12: 50 mg via ORAL
  Filled 2015-04-12: qty 1

## 2015-04-12 MED ORDER — LACTULOSE 10 GM/15ML PO SOLN
30.0000 g | Freq: Four times a day (QID) | ORAL | Status: DC
  Administered 2015-04-12 – 2015-04-14 (×10): 30 g via ORAL
  Filled 2015-04-12 (×11): qty 45

## 2015-04-12 NOTE — Progress Notes (Addendum)
Physical Therapy Treatment Patient Details Name: Keith [name] MRN: [id number] DOB: 04-19-1940 Today's Date: 10-May-2015    History of Present Illness 75 y.o. male presenting with altered mental status, suspected to be secondary to hepatic encephalopathy. PMH is significant for alcohol abuse with cirrhosis/hepatic encephalopathy/varices, anemia, HTN, and GI bleed.    PT Comments    Pt with much improved performance today, able to gait and transfer with min A. Still needs mod/max cuing for problem solving, attention, awareness.  Follow Up Recommendations  Home health PT;Supervision/Assistance - 24 hour     Equipment Recommendations  None recommended by PT    Recommendations for Other Services       Precautions / Restrictions Precautions Precautions: Fall Restrictions Weight Bearing Restrictions: No    Mobility  Bed Mobility Overal bed mobility: Needs Assistance Bed Mobility: Supine to Sit     Supine to sit: Min assist     General bed mobility comments: assist for balance and trunk control  Transfers Overall transfer level: Needs assistance Equipment used: Rolling walker (2 wheeled)   Sit to Stand: Min assist         General transfer comment: impulsive, requires cues for safety  Ambulation/Gait Ambulation/Gait assistance: Min assist Ambulation Distance (Feet): 125 Feet Assistive device: Rolling walker (2 wheeled)       General Gait Details: pt with wide BOS, min A with turning   Stairs            Wheelchair Mobility    Modified Rankin (Stroke Patients Only)       Balance                                    Cognition   Behavior During Therapy: Impulsive;Flat affect Overall Cognitive Status: No family/caregiver present to determine baseline cognitive functioning       Memory: Decreased short-term memory Following Commands: Follows one step commands inconsistently Safety/Judgement: Decreased awareness of  safety Awareness: Emergent Problem Solving: Requires verbal cues;Slow processing;Decreased initiation;Difficulty sequencing;Requires tactile cues      Exercises      General Comments        Pertinent Vitals/Pain Pain Assessment: No/denies pain    Home Living                      Prior Function            PT Goals (current goals can now be found in the care plan section) Acute Rehab PT Goals Patient Stated Goal: not stated PT Goal Formulation: Patient unable to participate in goal setting Time For Goal Achievement: 04/24/15 Potential to Achieve Goals: Fair Progress towards PT goals: Progressing toward goals. Some goals met and upgraded    Frequency  Min 3X/week    PT Plan Current plan remains appropriate    Co-evaluation             End of Session Equipment Utilized During Treatment: Gait belt Activity Tolerance: Patient tolerated treatment well Patient left: in bed;with call bell/phone within reach;with bed alarm set     Time: 1005-1020 PT Time Calculation (min) (ACUTE ONLY): 15 min  Charges:  $Gait Training: 8-22 mins                    G Codes:      Tierney Behl 05/10/2015, 10:33 AM

## 2015-04-12 NOTE — Care Management Note (Addendum)
Case Management Note  Patient Details  Name: Keith [name] MRN: [id number] Date of Birth: Jul 12, 1940  Subjective/Objective:                 Patient admitted with hepatic encephalopathy, active with [facility] prior to admission. Referral made for [facility] RN HHA PT OT SW. Patient lives with daughter, has private caregivers as well. CM requested orders, anticipate DC today.   Action/Plan:  Anticipate DC to home w/ HH through [facility].  Expected Discharge Date:                  Expected Discharge Plan:  Home w Home Health Services  In-House Referral:     Discharge planning Services  CM Consult  Post Acute Care Choice:  Resumption of Svcs/PTA Provider, Home Health Choice offered to:     DME Arranged:    DME Agency:     HH Arranged:  RN, PT, OT, Social Work, Facilities manager Therapy, Nurse's Aide (Active with [facility]) HH Agency:  [facility]  Status of Service:  Completed, signed off  Medicare Important Message Given:  Yes Date Medicare IM Given:    Medicare IM give by:    Date Additional Medicare IM Given:    Additional Medicare Important Message give by:     If discussed at Long Length of Stay Meetings, dates discussed:    Additional Comments:  Lawerance Sabal, RN 04/12/2015, 2:30 PM

## 2015-04-13 ENCOUNTER — Ambulatory Visit: Payer: Medicare Other | Admitting: Diagnostic Neuroimaging

## 2015-04-13 ENCOUNTER — Inpatient Hospital Stay (HOSPITAL_COMMUNITY): Payer: Medicare Other

## 2015-04-13 LAB — COMPREHENSIVE METABOLIC PANEL
ALBUMIN: 2.7 g/dL — AB (ref 3.5–5.0)
ALT: 41 U/L (ref 17–63)
AST: 70 U/L — AB (ref 15–41)
Alkaline Phosphatase: 99 U/L (ref 38–126)
Anion gap: 9 (ref 5–15)
BUN: 14 mg/dL (ref 6–20)
CHLORIDE: 112 mmol/L — AB (ref 101–111)
CO2: 21 mmol/L — AB (ref 22–32)
Calcium: 9.4 mg/dL (ref 8.9–10.3)
Creatinine, Ser: 0.82 mg/dL (ref 0.61–1.24)
GFR calc Af Amer: >60 mL/min (ref >60)
GFR calc non Af Amer: >60 mL/min (ref >60)
GLUCOSE: 141 mg/dL — AB (ref 65–99)
POTASSIUM: 4.4 mmol/L (ref 3.5–5.1)
Sodium: 142 mmol/L (ref 135–145)
Total Bilirubin: 0.8 mg/dL (ref 0.3–1.2)
Total Protein: 6.6 g/dL (ref 6.5–8.1)

## 2015-04-13 LAB — GLUCOSE, CAPILLARY
GLUCOSE-CAPILLARY: 93 mg/dL (ref 65–99)
GLUCOSE-CAPILLARY: 164 mg/dL — AB (ref 65–99)
Glucose-Capillary: 132 mg/dL — ABNORMAL HIGH (ref 65–99)
Glucose-Capillary: 128 mg/dL — ABNORMAL HIGH (ref 65–99)

## 2015-04-13 LAB — AMMONIA: Ammonia: 60 umol/L — ABNORMAL HIGH (ref 9–35)

## 2015-04-13 MED ORDER — TRAMADOL HCL 50 MG PO TABS
50.0000 mg | ORAL_TABLET | Freq: Two times a day (BID) | ORAL | Status: DC | PRN
  Administered 2015-04-13 – 2015-04-14 (×2): 50 mg via ORAL
  Filled 2015-04-13 (×2): qty 1

## 2015-04-13 MED ORDER — GADOBENATE DIMEGLUMINE 529 MG/ML IV SOLN
17.0000 mL | Freq: Once | INTRAVENOUS | Status: AC | PRN
  Administered 2015-04-13: 17 mL via INTRAVENOUS

## 2015-04-13 MED ORDER — LORAZEPAM 0.5 MG PO TABS
0.5000 mg | ORAL_TABLET | Freq: Once | ORAL | Status: AC
  Administered 2015-04-13: 0.5 mg via ORAL
  Filled 2015-04-13: qty 1

## 2015-04-13 MED ORDER — TRAMADOL HCL 50 MG PO TABS: 50.0000 mg | ORAL_TABLET | Freq: Four times a day (QID) | ORAL | Status: DC | PRN

## 2015-04-13 NOTE — Progress Notes (Signed)
Family Medicine Teaching Service Daily Progress Note Intern Pager: [phone]  Patient name: Keith [name] Medical record number: [id number] Date of birth: 02/14/1941 Age: 75 y.o. Gender: male  Primary Care Provider: Dennis Bast, MD Consultants: Neuro Code Status: FULL  Pt Overview and Major Events to Date:  2/16 - admitted to FPTS 2/17 - vanc started for positive blood cx 2/19 - vanc d/ced 2/20 - lactulose dose decreased 2/21 - lactulose dose increased due to worsening mental status  Assessment and Plan: RC AMISON is a 75 y.o. male presenting with altered mental status. PMH is significant for alcohol abuse with cirrhosis/hepatic encephalopathy/varices, anemia, HTN, HFpEF.  # Altered Mental Status: Suspected to be secondary to hepatic encephalopathy. History of cirrhosis and has had hepatic encephalopathy in the past. Ammonia 101 at admission, baseline ~60. Albumin 3. MELD Score 9 on admission. EEG consistent with hepatic encephalopathy. Prescribed Lactulose 20g BID and Rifaximin  BID at home.Coag neg staph in one anaerobic blood cx bottle; s/p two days vanc, but discontinued as most likely contaminant. Patient is likely at new mental status baseline. Patient's ammonia had returned to baseline (~60), however ammonia increased to 74 yesterday (74); lactulose dose was increased, and patient was not discharged. Increased number of BM yesterday after increasing lactulose dose (3 BM). Oriented to person this AM.  - Continue to monitor  - Resume increased lactulose dose of 30g QID - Continue Miralax and home Rifaximin  # Alcohol Use: EtOH level <5. Currently prescribed Disulfiram and Thiamine/Folate. Last drink reported 6 months ago. - CIWA Protocol - Continue home medications   # Back Pain: Recently discontinued Tramadol and Roxicodone due to worsening confusion and constipation. Started on Gabapentin day prior to admission. Attempted to perform MRI overnight, however  patient could not cooperate and remain still during scan.  - Holding Gabapentin per neurology - Lidocaine patch - Holding other pain medication at this time. Acetaminophen (cirrhosis, hepatic encephalopathy), NSAIDs (GI bleed, varices), and most oral opioids (confusion, constipation) contraindicated, making pain management difficult.  - Can consider Tramadol if extreme pain - As patient presented for altered mental status, sedation for MRI not recommended. Outpatient MRI lumbar spine best option at this time.   # Anemia: Hemoglobin 11.9 at admission. Other lines normal (WBC 7.1 and Platelets 170). Baseline Hemoglobin 9-10. Currently prescribed Ferrex . Hgb decreased to 9.8 (2/18).  - Continue Ferrex   # Hypertension: Currently prescribed Coreg , Lasix , Spironolactone . Slightly hypo- to normotensive overnight. - Continue home meds - Monitor BP  # Anxiety/Depression: Currently prescribed Lexapro , Seroquel  every other day. - Continue home medication. Note in process of titrating off of Seroquel.  # Diabetes: Currently prescribed Januvia . Glucose 108, 123 overnight.  - Holding Januvia - Sensitive Sliding Scale - Monitor CBG qACHS  FEN/GI: Heart Healthy Prophylaxis: SCDs  Disposition: home with daughter (primary caretaker) upon medical improvement   Subjective:  Patient is oriented to person this AM. His daughter feels that he is improved from when she saw him yesterday. She is concerned about his back pain, and wants patient to be sedated for MRI prior to discharge. Patient denies back pain this AM.  Patient had 3 bowel movements overnight after increasing dose of lactulose.   Objective: Temp:  [98.1 F (36.7 C)-99 F (37.2 C)] 98.1 F (36.7 C) (02/22 0507) Pulse Rate:  [61-76] 61 (02/22 0507) Resp:  [18-20] 18 (02/22 0507) BP: (112-135)/(54-76) 117/54 mmHg (02/22 0507) SpO2:  [92 %-99 %] 92 % (02/22  65) Physical Exam: General: resting  comfortably in bed; in NAD; daughter at bedside Cardiovascular: RRR, no murmurs appreciated Respiratory: CTAB, no wheezes Abdomen: soft, non-tender, non-distended Extremities: no edema or tenderness Neuro: alert, oriented to person; tremor of lower lip, resting tremor of hands, both improved from yesterday; unable to perform thorough neuro exam as patient cannot comprehend commands  Laboratory:  Recent Labs Lab 04/07/15 1640 04/07/15 1650 04/08/15 0640 04/09/15 1316  WBC 7.1  --  6.5 5.8  HGB 10.8* 11.9* 9.8* 9.7*  HCT 31.9* 35.0* 28.9* 30.1*  PLT 170  --  145* 151    Recent Labs Lab 04/07/15 1640  04/08/15 0640 04/09/15 1316 04/12/15 1148  NA 139  < > 138 141 141  K 4.6  < > 4.1 4.7 4.4  CL 107  < > 110 110 111  CO2 20*  --  20* 21* 21*  BUN 15  < > CREATININE 0.86  < > 0.71 0.70 0.85  CALCIUM 9.4  --  9.0 9.1 9.3  PROT 7.3  --  6.6  --  6.7  BILITOT 1.1  --  1.1  --  1.2  ALKPHOS 105  --  91  --  95  ALT 25  --  23  --  38  AST 48*  --  42*  --  70*  GLUCOSE 114*  < > 113* 162* 175*  < > = values in this interval not displayed.  Imaging/Diagnostic Tests: Ct Head Wo Contrast 04/07/2015 IMPRESSION: Atrophic changes stable from the prior exam. No acute abnormality noted.   Dg Chest Portable 1 View 04/07/2015 IMPRESSION: No active disease.   Marquette Saa, MD 04/13/2015, 9:33 AM PGY-1, [facility] Family Medicine FPTS Intern pager: [phone], text pages welcome

## 2015-04-13 NOTE — Progress Notes (Signed)
Bladder scan reveals 30-50 ml

## 2015-04-13 NOTE — Progress Notes (Signed)
Nutrition Follow-up  DOCUMENTATION CODES:   Not applicable  INTERVENTION:   -Continue Glucerna Shake po BID, each supplement provides 220 kcal and 10 grams of protein  NUTRITION DIAGNOSIS:   Increased nutrient needs related to chronic illness as evidenced by estimated needs.  Ongoing  GOAL:   Patient will meet greater than or equal to 90% of their needs  Progressing  MONITOR:   PO intake, Supplement acceptance, Labs, Weight trends, Skin, I & O's  REASON FOR ASSESSMENT:   Consult  (Pt likes Glucerna)  ASSESSMENT:   Keith [name] is a 75 y.o. male presenting with sudden onset of confusion at 2:50pm today while in MRI. Daughter reports confusion has been increasing over the last month and then acutely worsened today, like he was having a "psychotic break". Was attempting to have an outpatient lumbar spine MRI when he became agitated and violent--MRI was not completed. Patient returned closer to baseline since arrival at hospital.  Spoke with RN prior to visit. She reports that pt's meal intake varies dependent on pt's mentation. Meal completion 25-75% per doc flowsheets. RN confirms that pt is consuming Glucerna supplements.   Pt lying in bed with eyes closed at time of visit. Spoke with pt daughter at bedside. She confirms that pt's appetite is variable. Breakfast tray at bedside reveals about 25% completion. Pt daughter also confirms that pt is consuming supplements and intends to continue use at discharge.   Pt daughter expressed appreciation for visit, but did not voice any further nutrition-related concerns at this time.   Labs reviewed: CBGS: 93-160.  Diet Order:  Diet heart healthy/carb modified Room service appropriate?: Yes; Fluid consistency:: Thin  Skin:  Reviewed, no issues  Last BM:  04/13/15  Height:   Ht Readings from Last 1 Encounters:  04/10/15  (1.702 m)    Weight:   Wt Readings from Last 1 Encounters:  04/10/15 184 lb (83.462 kg)     Ideal Body Weight:  67.3 kg  BMI:  Body mass index is 28.81 kg/(m^2).  Estimated Nutritional Needs:   Kcal:  2000-2200  Protein:  105-120 grams  Fluid:  2.0-2.2 L  EDUCATION NEEDS:   No education needs identified at this time  Ritvik Mczeal A. Mayford Knife, RD, LDN, CDE Pager: [phone] After hours Pager: [phone]

## 2015-04-14 ENCOUNTER — Encounter: Payer: Self-pay | Admitting: Diagnostic Neuroimaging

## 2015-04-14 ENCOUNTER — Telehealth: Payer: Self-pay | Admitting: *Deleted

## 2015-04-14 DIAGNOSIS — M545 Low back pain: Secondary | ICD-10-CM

## 2015-04-14 DIAGNOSIS — Z515 Encounter for palliative care: Secondary | ICD-10-CM

## 2015-04-14 DIAGNOSIS — G8929 Other chronic pain: Secondary | ICD-10-CM

## 2015-04-14 DIAGNOSIS — Z7189 Other specified counseling: Secondary | ICD-10-CM

## 2015-04-14 LAB — GLUCOSE, CAPILLARY
Glucose-Capillary: 98 mg/dL (ref 65–99)
Glucose-Capillary: 144 mg/dL — ABNORMAL HIGH (ref 65–99)
Glucose-Capillary: 150 mg/dL — ABNORMAL HIGH (ref 65–99)

## 2015-04-14 MED ORDER — QUETIAPINE FUMARATE 25 MG PO TABS: 25.0000 mg | ORAL_TABLET | ORAL | Status: DC

## 2015-04-14 MED ORDER — LACTULOSE 10 GM/15ML PO SOLN: 30.0000 g | Freq: Four times a day (QID) | ORAL | Status: AC

## 2015-04-14 MED ORDER — LIDOCAINE 5 % EX PTCH: 1.0000 | MEDICATED_PATCH | CUTANEOUS | Status: DC

## 2015-04-14 NOTE — Progress Notes (Signed)
D/c'd to home w/ DTR via w/c al belongings given as well as d/c instructions

## 2015-04-14 NOTE — Discharge Instructions (Signed)
You were admitted for altered mental status due to hepatic encephalopathy (build up of toxins due to liver damage). This was probably because you were not taking enough lactulose, as well as taking medications that can cause confusion. You improved after starting a higher dose of lactulose and stopped some of those medications.   Please continue to take lactulose 30g every six hours.   For your back pain, you can use the lidocaine patches as needed. If the pain is severe, you can use Tramadol up to twice a day only as needed. Please do NOT take any other pain medications, as these can cause confusion.   The hepatology (liver specialist) clinic will call you with the date and time of your appointment.   It is important to schedule a follow-up appointment with your regular doctor within one week.

## 2015-04-14 NOTE — Consult Note (Signed)
Consultation Note Date: 04/14/2015   Patient Name: Keith [name]  DOB: [birth year]  MRN: [id number]  Age / Sex: 75 y.o., male  PCP: Keith Eves. 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  AMS most likely related to hepatic encephalopathy.  Patient has chronic back pain.    This NP Lorinda Creed reviewed medical records, received report from team, assessed the patient and then meet at the patient's bedside along with [name]  to discuss diagnosis prognosis, GOC, EOL wishes disposition and options.  Daughter tells me "he is going home this morning", continued conversation regarding her experience with caring for her father over the past year.    It has been difficult on many level; emotionally, financially and physically.  Per daughter the patient has had noted physical and functional and cognitive decline over the past several weeks.   They have been compliant with all medical interventions.   He has been sober since June 2016.  She is frustrated with limited in home care options and disjointed healthcare system.  Family is open to all offered and available medical interventions to prolong life.  A  discussion was had today regarding advanced directives.    The difference between a aggressive medical intervention path  and a palliative comfort care path for this patient at this time was had.  Values and goals of care important to patient and family were attempted to be elicited.    Questions and concerns addressed.  . Family encouraged to call with questions  or concerns.  PMT will continue to support holistically.   HCPOA: yes and is scanned into EMR for viewing    SUMMARY OF RECOMMENDATIONS  -treat the treatable, family is hopeful for improvement (patient  has been sober since June 2016, there are 24/7 caregivers in the home, daughter is very involved caregiver, they are compliant with all treatments and keep doctor visits)   --Desire referral to [facility] --Desire referral for nutritional support  Code Status/Advance Care Planning:  Full code -discussed importance of consideration of DNR status in patient with serious life limiting disease.      Code Status Orders        Start     Ordered   04/07/15 2226  Full code   Continuous     04/07/15 2225    Code Status History    Date Active Date Inactive Code Status Order ID Comments User Context   08/03/2014  6:42 AM 08/16/2014 10:21 PM Full Code [id number]  Lorretta Harp, MD ED   06/14/2012  2:40 AM 06/20/2012  3:27 PM Full Code [id number]  Eduard Clos, MD Inpatient   05/31/2012  6:31 AM 06/03/2012  7:42 PM Full Code [id number]  Curlene Dolphin, MD ED    Advance Directive Documentation        Most Recent Value   Type of Advance Directive  Healthcare Power of Attorney   Pre-existing out of facility DNR order (yellow form or pink MOST form)     "MOST" Form in Place?         Symptom Management:   Weakness: PT evaluation and treatmetn  Palliative Prophylaxis:   Aspiration, Bowel Regimen, Delirium Protocol, Frequent Pain Assessment  and Oral Care    Psycho-social/Spiritual:  Support System: Fair Desire for further Chaplaincy support:no   Discharge Planning: Home with Home Health, OP liver clinic   Chief Complaint/ Primary Diagnoses: Present on Admission:  . Hepatic encephalopathy (HCC)  I have reviewed the medical record, interviewed the patient and family, and examined the patient. The following aspects are pertinent.  Past Medical History  Diagnosis Date    . HTN (hypertension)   . Hypercholesterolemia   . Alcohol abuse 2014    states last drink 07/03/14  . A-fib (HCC) 05/2012    in the setting of acute medical illness/ respiratory arrest. Not placed on anticoagulant.   . History of GI bleed ~ 1990    bleeding from ulcer  . History of cardiac arrest 05/2012    due to tracheostomy site bleeding and airway obstruction  . Angioedema 05/2012    with acute resp failure and hypoxia due to Lisinopril  . Respiratory arrest (HCC) 05/2012    caused by tracheostomy site bleeding and subsequent airway obstruciton.   . Anemia 05/2012, 07/2014.     normocytic 2014, macrocytic 2016  . Cirrhosis (HCC) 07/2014  . Depression   . Anxiety    Social History   Social History  . Marital Status: Single    Spouse Name: N/A  . Number of Children: 2  . Years of Education: 12   Occupational History  . retired 2008     worked for [employer]   Social History Main Topics  . Smoking status: Never Smoker   . Smokeless tobacco: Never Used  . Alcohol Use: No     Comment: drinks daily. in 07/2012 drinking of 16-24 oz vodka daily reported. 09/22/14 none since 07/03/14  . Drug Use: No  . Sexual Activity: Not Asked   Other Topics Concern  . None   Social History Narrative   Single , lives with daughter in home   Caffeine use - coffee 1-2 Day   Family History  Problem Relation Age of Onset  . Breast cancer Mother   . Stroke Father    Scheduled Meds: . carvedilol  25 mg Oral BID WC  . escitalopram  5 mg Oral Daily  . feeding supplement (GLUCERNA SHAKE)  237 mL Oral BID BM  . folic acid  1 mg Oral Daily  . furosemide  20 mg Oral Daily  . insulin aspart  0-9 Units Subcutaneous TID WC  . [name] polysaccharides  150 mg Oral Daily  . lactulose  30 g Oral QID  . lidocaine  1 patch Transdermal Q24H  . multivitamin with minerals  1 tablet Oral Daily  . pantoprazole  40 mg Oral Daily  . QUEtiapine  25 mg Oral QODAY  . rifaximin  550 mg Oral BID  .  spironolactone  50 mg Oral Daily  . thiamine  100 mg Oral Daily   Continuous Infusions:  PRN Meds:.polyethylene glycol, traMADol Medications Prior to Admission:  Prior to Admission medications   Medication Sig Start Date End Date Taking? Authorizing Provider  albuterol (PROVENTIL HFA;VENTOLIN HFA) 108 (90 BASE) MCG/ACT inhaler Inhale 2 puffs into the lungs every 6 (six) hours as needed for wheezing. 06/20/12  Yes Russella Dar, NP  carvedilol (COREG) 25 MG tablet Take 25 mg by mouth 2 (two) times daily with a meal.   Yes Historical Provider, MD  disulfiram (ANTABUSE) 250 MG tablet Take 250 mg by mouth daily.   Yes Historical Provider, MD  EPINEPHrine (  EPI-PEN) 0.3 mg/0.3 mL DEVI Inject 0.3 mg into the muscle as needed (for allergic reaction).  06/03/12  Yes Leslye Peer, MD  escitalopram (LEXAPRO) 5 MG tablet Take 5 mg by mouth daily. 01/26/15  Yes Historical Provider, MD  FERREX 150 150 MG capsule Take 150 mg by mouth daily. 09/02/14  Yes Historical Provider, MD  fluticasone (FLONASE) 50 MCG/ACT nasal spray Place 1 spray into both nostrils 2 (two) times daily as needed. For nasal congestion. 07/21/14  Yes Historical Provider, MD  folic acid (FOLVITE) 1 MG tablet Take 1 tablet (1 mg total) by mouth daily. 08/16/14  Yes Kathlen Mody, MD  furosemide (LASIX) 20 MG tablet Take 20 mg by mouth daily. 07/27/14  Yes Historical Provider, MD  gabapentin (NEURONTIN) 300 MG capsule Take 1 capsule by mouth 3 (three) times daily. 04/06/15  Yes Historical Provider, MD  lactulose (CHRONULAC) 10 GM/15ML solution Take 30 mLs (20 g total) by mouth 2 (two) times daily. 01/11/15  Yes Rachael Fee, MD  Multiple Vitamins-Minerals (MULTIVITAMIN WITH MINERALS) tablet Take 1 tablet by mouth daily.   Yes Historical Provider, MD  oxyCODONE (OXY IR/ROXICODONE) 5 MG immediate release tablet Take 1 tablet by mouth 2 (two) times daily. 04/05/15  Yes Historical Provider, MD  pantoprazole (PROTONIX) 40 MG tablet Take 1 tablet (40 mg  total) by mouth daily. 08/16/14  Yes Kathlen Mody, MD  QUEtiapine (SEROQUEL) 25 MG tablet Take 1 tablet (25 mg total) by mouth 2 (two) times daily. Patient taking differently: Take 25 mg by mouth every other day.  08/16/14  Yes Kathlen Mody, MD  rifaximin (XIFAXAN) 550 MG TABS tablet Take 1 tablet (550 mg total) by mouth 2 (two) times daily. 08/16/14  Yes Kathlen Mody, MD  sitaGLIPtin (JANUVIA) 100 MG tablet Take 100 mg by mouth. 03/09/15 04/08/15 Yes Historical Provider, MD  spironolactone (ALDACTONE) 50 MG tablet Take 1 tablet (50 mg total) by mouth daily. 10/07/14  Yes Rachael Fee, MD  thiamine 100 MG tablet Take 1 tablet (100 mg total) by mouth daily. 05/31/12  Yes Coralyn Helling, MD  traMADol (ULTRAM) 50 MG tablet Take 25-50 mg by mouth 2 (two) times daily as needed. pain 01/26/15  Yes Historical Provider, MD   Allergies  Allergen Reactions  . Lisinopril Swelling    Presented with laryngeal edema (angioedema)- likely caused is lisinopril   . Amlodipine Swelling    Review of Systems  Unable to perform ROS   Physical Exam  Constitutional: He appears well-developed and well-nourished.  HENT:  Head: Normocephalic.  Cardiovascular: Normal rate and regular rhythm.   Respiratory: Effort normal and breath sounds normal.  GI: Soft. He exhibits no distension. There is no tenderness.  Neurological: He is alert.  Oriented to person and place    Vital Signs: BP 124/54 mmHg  Pulse 74  Temp(Src) 98.6 F (37 C) (Oral)  Resp 18  Ht 5\' 7"  (1.702 m)  Wt 83.462 kg (184 lb)  BMI 28.81 kg/m2  SpO2 97%  SpO2: SpO2: 97 % O2 Device:SpO2: 97 % O2 Flow Rate: .   IO: Intake/output summary:  Intake/Output Summary (Last 24 hours) at 04/14/15 0950 Last data filed at 04/13/15 1700  Gross per 24 hour  Intake    720 ml  Output      0 ml  Net    720 ml    LBM: Last BM Date: 04/13/15 Baseline Weight: Weight: 83.462 kg (184 lb) Most recent weight: Weight: 83.462 kg (184 lb)  Palliative  Assessment/Data:  Flowsheet Rows        Most Recent Value   Intake Tab    Referral Department  -- [family medicine]   Unit at Time of Referral  Med/Surg Unit   Palliative Care Primary Diagnosis  Other (Comment) [cirrhosis]   Date Notified  04/13/15   Palliative Care Type  New Palliative care   Reason for referral  Clarify Goals of Care   Date of Admission  04/07/15   # of days IP prior to Palliative referral  6   Clinical Assessment    Psychosocial & Spiritual Assessment    Palliative Care Outcomes       Additional Data Reviewed:  CBC:    Component Value Date/Time   WBC 5.8 04/09/2015 1316   HGB 9.7* 04/09/2015 1316   HCT 30.1* 04/09/2015 1316   PLT 151 04/09/2015 1316   MCV 81.8 04/09/2015 1316   NEUTROABS 3.8 04/07/2015 1640   LYMPHSABS 2.0 04/07/2015 1640   MONOABS 1.1* 04/07/2015 1640   EOSABS 0.2 04/07/2015 1640   BASOSABS 0.0 04/07/2015 1640   Comprehensive Metabolic Panel:    Component Value Date/Time   NA 142 04/13/2015 1608   K 4.4 04/13/2015 1608   CL 112* 04/13/2015 1608   CO2 21* 04/13/2015 1608   BUN 14 04/13/2015 1608   CREATININE 0.82 04/13/2015 1608   GLUCOSE 141* 04/13/2015 1608   CALCIUM 9.4 04/13/2015 1608   AST 70* 04/13/2015 1608   ALT 41 04/13/2015 1608   ALKPHOS 99 04/13/2015 1608   BILITOT 0.8 04/13/2015 1608   PROT 6.6 04/13/2015 1608   ALBUMIN 2.7* 04/13/2015 1608   Discussed with Dr Pollie Meyer  Time In: 0845 Time Out: 1000 Time Total: 75 min Greater than 50%  of this time was spent counseling and coordinating care related to the above assessment and plan.  Signed by: Lorinda Creed, NP  Canary Brim, NP  04/14/2015, 9:50 AM  Please contact Palliative Medicine Team phone at [phone] for questions and concerns.

## 2015-04-14 NOTE — Telephone Encounter (Signed)
Prior Authorization received from [facility] for Lidocaine.  PA form placed in provider box for completion. Clovis Pu, RN

## 2015-04-14 NOTE — Progress Notes (Signed)
Physical Therapy Treatment Patient Details Name: Keith [name] MRN: [id number] DOB: 1940/10/25 Today's Date: 04/14/2015    History of Present Illness 75 y.o. male presenting with altered mental status, suspected to be secondary to hepatic encephalopathy. PMH is significant for alcohol abuse with cirrhosis/hepatic encephalopathy/varices, anemia, HTN, and GI bleed.    PT Comments    Progressing well. Still with difficulty following instructions for gait but ambulating up to 140 feet using a rolling walker. Feels more confident with this device. Apprehensive but willing to work with therapy. Will follow and progress until discharge. Will have good family support at home.  Follow Up Recommendations  Home health PT;Supervision/Assistance - 24 hour     Equipment Recommendations  None recommended by PT    Recommendations for Other Services       Precautions / Restrictions Precautions Precautions: Fall Restrictions Weight Bearing Restrictions: No    Mobility  Bed Mobility Overal bed mobility: Needs Assistance Bed Mobility: Supine to Sit;Sit to Supine     Supine to sit: Supervision Sit to supine: Supervision   General bed mobility comments: Requires extra time and instructions. Use of rail minimally utilized to rise.  Transfers Overall transfer level: Needs assistance Equipment used: Rolling walker (2 wheeled) Transfers: Sit to/from Stand Sit to Stand: Supervision         General transfer comment: supervision for safety. VC for hand placement and instructions to rise/sit due to poor command following.  Ambulation/Gait Ambulation/Gait assistance: Min assist Ambulation Distance (Feet): 140 Feet Assistive device: Rolling walker (2 wheeled) Gait Pattern/deviations: Step-through pattern;Decreased stride length Gait velocity: slow Gait velocity interpretation: <1.8 ft/sec, indicative of risk for recurrent falls General Gait Details: Slow and guarded but steady with RW  for support. Min guard for safety when ambulating forwards, however needs assist for walker control min assist to step backwards several feet. Cues for forward gaze. No overt loss of balance noted. Needs frequent cues for instructions to continue ambulating and instructions to find room upon return.   Stairs            Wheelchair Mobility    Modified Rankin (Stroke Patients Only)       Balance                                    Cognition Arousal/Alertness: Lethargic Behavior During Therapy: Flat affect Overall Cognitive Status: No family/caregiver present to determine baseline cognitive functioning       Memory: Decreased short-term memory Following Commands: Follows one step commands inconsistently Safety/Judgement: Decreased awareness of safety   Problem Solving: Requires verbal cues;Slow processing;Decreased initiation;Difficulty sequencing;Requires tactile cues      Exercises General Exercises - Lower Extremity Ankle Circles/Pumps: AROM;Both;10 reps;Seated Long Arc Quad: Strengthening;Both;10 reps;Seated    General Comments        Pertinent Vitals/Pain Pain Assessment: No/denies pain    Home Living                      Prior Function            PT Goals (current goals can now be found in the care plan section) Acute Rehab PT Goals Patient Stated Goal: not stated PT Goal Formulation: Patient unable to participate in goal setting Time For Goal Achievement: 04/24/15 Potential to Achieve Goals: Fair Progress towards PT goals: Progressing toward goals    Frequency  Min 3X/week    PT Plan  Current plan remains appropriate    Co-evaluation             End of Session Equipment Utilized During Treatment: Gait belt Activity Tolerance: Patient tolerated treatment well Patient left: in bed;with call bell/phone within reach;with bed alarm set     Time: 1359-1416 PT Time Calculation (min) (ACUTE ONLY): 17 min  Charges:   $Gait Training: 8-22 mins                    G Codes:      Berton Mount 05-03-2015, 2:37 PM  [place], [place] [phone]

## 2015-04-14 NOTE — Progress Notes (Signed)
Bladder scan = <135

## 2015-04-15 DIAGNOSIS — Z515 Encounter for palliative care: Secondary | ICD-10-CM | POA: Insufficient documentation

## 2015-04-15 DIAGNOSIS — Z7189 Other specified counseling: Secondary | ICD-10-CM | POA: Insufficient documentation

## 2015-04-15 NOTE — Telephone Encounter (Signed)
PA form faxed to [facility] for review. Manessa Buley L, RN  

## 2015-04-18 NOTE — Telephone Encounter (Signed)
PA for Lidocaine was denied via [employer]. Reference number: [id number].  Clovis Pu, RN

## 2015-04-19 ENCOUNTER — Ambulatory Visit: Payer: Medicare Other | Admitting: Diagnostic Neuroimaging

## 2015-04-20 ENCOUNTER — Ambulatory Visit: Payer: Medicare Other | Admitting: Diagnostic Neuroimaging

## 2015-04-22 ENCOUNTER — Other Ambulatory Visit (INDEPENDENT_AMBULATORY_CARE_PROVIDER_SITE_OTHER): Payer: Self-pay

## 2015-04-22 ENCOUNTER — Encounter: Payer: Self-pay | Admitting: Physician Assistant

## 2015-04-22 ENCOUNTER — Ambulatory Visit (INDEPENDENT_AMBULATORY_CARE_PROVIDER_SITE_OTHER): Payer: Medicare Other | Admitting: Physician Assistant

## 2015-04-22 VITALS — BP 128/66 | HR 58 | Ht 67.0 in | Wt 189.5 lb

## 2015-04-22 DIAGNOSIS — K7469 Other cirrhosis of liver: Secondary | ICD-10-CM

## 2015-04-22 DIAGNOSIS — K7290 Hepatic failure, unspecified without coma: Secondary | ICD-10-CM

## 2015-04-22 DIAGNOSIS — E119 Type 2 diabetes mellitus without complications: Secondary | ICD-10-CM

## 2015-04-22 DIAGNOSIS — K7682 Hepatic encephalopathy: Secondary | ICD-10-CM

## 2015-04-22 LAB — COMPREHENSIVE METABOLIC PANEL
ALBUMIN: 3.6 g/dL (ref 3.5–5.2)
ALK PHOS: 93 U/L (ref 39–117)
ALT: 25 U/L (ref 0–53)
AST: 43 U/L — ABNORMAL HIGH (ref 0–37)
BILIRUBIN TOTAL: 0.8 mg/dL (ref 0.2–1.2)
BUN: 20 mg/dL (ref 6–23)
CALCIUM: 9.6 mg/dL (ref 8.4–10.5)
CO2: 22 mEq/L (ref 19–32)
Chloride: 105 mEq/L (ref 96–112)
Creatinine, Ser: 1.32 mg/dL (ref 0.40–1.50)
GFR: 68.29 mL/min (ref >60.00)
GLUCOSE: 137 mg/dL — AB (ref 70–99)
Potassium: 4.3 mEq/L (ref 3.5–5.1)
Sodium: 136 mEq/L (ref 135–145)
TOTAL PROTEIN: 7.5 g/dL (ref 6.0–8.3)

## 2015-04-22 LAB — AMMONIA: AMMONIA: 76 umol/L — AB (ref 11–35)

## 2015-04-22 LAB — PROTIME-INR
INR: 1.4 ratio — ABNORMAL HIGH (ref 0.8–1.0)
PROTHROMBIN TIME: 14.3 s — AB (ref 9.6–13.1)

## 2015-04-22 NOTE — Progress Notes (Signed)
Patient ID: Keith [name], male   DOB: Dec 10, 1940, 75 y.o.   MRN: [id number]   Subjective:    Patient ID: Keith [name], male    DOB: 17-Oct-1940, 75 y.o.   MRN: [id number]  HPI  Keith [name] Is a 75 year old African-American male known to Dr. 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 He also has history of bleeding esophageal varices. He had EGD with banding in June 2016 and then a repeat banding for smaller varices in August 2016. He was last seen in our office in January. He had a recent admission to 2/16 through 04/14/2015 with exacerbation of encephalopathy. He did have palliative care consultation during that his mission as well and family decision was to "treat the treatable". He is being maintained on Xifaxan 550 twice a day and had lactulose increased from 30 mL twice a day to 45 mL 4 times a day. Line He comes in today for follow-up with his daughter. She says they were also given a new diagnosis of diabetes. And says that he has been having a lot of problems with pain in his neck and his arm from a pinched nerve. He has a prescription for hydrocodone and she is trying to use this very sparingly. She says she learned while he was in the hospital the pain medications or not good in cirrhosis and had contributed to his altered mental status. She did give him 1 pain pill earlier this morning because he was calling out in pain. She says he is at night I'll usually stays up most of the night and sleeps during the day. He somnolent in the office today but arousable. She says his mentation has been good. He has been having multiple bowel movements per day usually small volume loose to liquid stools and yesterday probably went 8 or 9 times. She says she's also complaining of a lot more abdominal cramping with the higher doses of lactulose.  Review of Systems Pertinent positive and negative review of systems were noted in the above HPI section.  All  other review of systems was otherwise negative.  Outpatient Encounter Prescriptions as of 04/22/2015  Medication Sig  . albuterol (PROVENTIL HFA;VENTOLIN HFA) 108 (90 BASE) MCG/ACT inhaler Inhale 2 puffs into the lungs every 6 (six) hours as needed for wheezing.  . carvedilol (COREG) 25 MG tablet Take 25 mg by mouth 2 (two) times daily with a meal.  . disulfiram (ANTABUSE) 250 MG tablet Take 250 mg by mouth daily.  Marland Kitchen EPINEPHrine (EPI-PEN) 0.3 mg/0.3 mL DEVI Inject 0.3 mg into the muscle as needed (for allergic reaction).   Marland Kitchen escitalopram (LEXAPRO) 5 MG tablet Take 5 mg by mouth daily.  Marland Kitchen FERREX 150 150 MG capsule Take 150 mg by mouth daily.  . fluticasone (FLONASE) 50 MCG/ACT nasal spray Place 1 spray into both nostrils 2 (two) times daily as needed. For nasal congestion.  . folic acid (FOLVITE) 1 MG tablet Take 1 tablet (1 mg total) by mouth daily.  . furosemide (LASIX) 20 MG tablet Take 20 mg by mouth daily.  Marland Kitchen lactulose (CHRONULAC) 10 GM/15ML solution Take 45 mLs (30 g total) by mouth every 6 (six) hours.  . lidocaine (LIDODERM) 5 % Place 1 patch onto the skin daily. Remove & Discard patch within 12 hours or as directed by MD  . Multiple Vitamins-Minerals (MULTIVITAMIN WITH MINERALS) tablet Take 1 tablet by mouth daily.  . pantoprazole (PROTONIX) 40 MG tablet Take 1 tablet (40 mg  total) by mouth daily.  . QUEtiapine (SEROQUEL) 25 MG tablet Take 1 tablet (25 mg total) by mouth every other day.  . rifaximin (XIFAXAN) 550 MG TABS tablet Take 1 tablet (550 mg total) by mouth 2 (two) times daily.  Marland Kitchen spironolactone (ALDACTONE) 50 MG tablet Take 1 tablet (50 mg total) by mouth daily.  Marland Kitchen thiamine 100 MG tablet Take 1 tablet (100 mg total) by mouth daily.  . traMADol (ULTRAM) 50 MG tablet Take 25-50 mg by mouth 2 (two) times daily as needed. pain  . sitaGLIPtin (JANUVIA) 100 MG tablet Take 100 mg by mouth.  . [DISCONTINUED] gabapentin (NEURONTIN) 300 MG capsule Take 1 capsule by mouth 3 (three) times  daily. Reported on 04/22/2015   No facility-administered encounter medications on file as of 04/22/2015.   Allergies  Allergen Reactions  . Lisinopril Swelling    Presented with laryngeal edema (angioedema)- likely caused is lisinopril   . Amlodipine Swelling   Patient Active Problem List   Diagnosis Date Noted  . DNR (do not resuscitate) discussion   . Palliative care encounter   . Incontinence   . Chronic bilateral back pain   . Absence of bladder continence   . Notalgia   . Alcoholic cirrhosis of liver without ascites (HCC)   . Hepatic encephalopathy (HCC) 04/07/2015  . Altered mental status   . Alcohol abuse, in remission 09/22/2014  . Cough 09/08/2014  . Esophageal varices (HCC) 09/08/2014  . Ileus (HCC)   . Rectal varices   . Stricture, duodenum   . Esophageal varices with bleeding (HCC)   . Cirrhosis (HCC)   . SBO (small bowel obstruction) (HCC) 08/08/2014  . Leukocytosis   . Esophageal varices in alcoholic cirrhosis (HCC)   . Adynamic ileus (HCC) 08/05/2014  . Hypernatremia 08/05/2014  . GI bleed 08/04/2014  . Encephalopathy, hepatic (HCC) 08/04/2014  . Hepatic cirrhosis (HCC) 08/04/2014  . Acute blood loss anemia   . Lower GI bleed 08/03/2014  . Acute encephalopathy 08/03/2014  . Rectal bleeding 08/03/2014  . Alcohol abuse 08/03/2014  . GIB (gastrointestinal bleeding) 08/03/2014  . Chronic diastolic CHF (congestive heart failure) (HCC)   . Abdominal pain   . Atrial fibrillation (HCC) 07/15/2012  . Fever, unspecified 06/17/2012  . Airway obstruction/recurrent 06/14/2012  . Tracheostomy in place [facility]) 06/14/2012  . Fracture of rib of right side 06/14/2012  . Fracture, sternum closed 06/14/2012  . Cardiac arrest (HCC) 05/31/2012  . Bleeding 05/31/2012  . Tracheostomy complication (HCC) 05/31/2012  . Fracture of multiple ribs 05/31/2012  . Sternal fracture 05/31/2012  . Acute respiratory failure (HCC) 05/31/2012  . Laryngeal edema 05/22/2012  . Hypertension  05/22/2012  . Hyperlipidemia 05/22/2012  . Habitual alcohol use 05/22/2012   Social History   Social History  . Marital Status: Single    Spouse Name: N/A  . Number of Children: 2  . Years of Education: 12   Occupational History  . retired 2008     worked for [employer]   Social History Main Topics  . Smoking status: Never Smoker   . Smokeless tobacco: Never Used  . Alcohol Use: No     Comment: drinks daily. in 07/2012 drinking of 16-24 oz vodka daily reported. 09/22/14 none since 07/03/14  . Drug Use: No  . Sexual Activity: Not on file   Other Topics Concern  . Not on file   Social History Narrative   Single , lives with daughter in home   Caffeine use -  coffee 1-2 Day    Mr. Olegario Messierennywell's family history includes Breast cancer in his mother; Stroke in his father.      Objective:    Filed Vitals:   04/22/15 1054  BP: 128/66  Pulse: 58    Physical Exam  well-developed older African-American male in no acute distress company by his daughter. Patient is in a wheelchair and appears very sleepy and arousable when spoken to and appropriate blood pressure 128/66 pulse 58 height 5 foot 7 weight 189. 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 He seems to be doing much better currently.  #2 history of esophageal varices last banding August 2016 plan for follow-up in 1 year #3 alcoholism currently inactive on Antabuse #4 cervical spine disease with neuropathic pain-awaiting  neurosurgical consultation  Plan; we will check CMET, ProTime ammonia and AFP today Long discussion with patient's daughter regarding management of  Encephalopathy-as he has been having numerous bowel movements and complaining of abdominal cramping Will decrease lactulose to 45 mL by mouth twice a day. I distal did discuss altering his dosage based on mental status and advise doubling up on his dosage should he have any declining mental status. Continue Xifaxan 550 mg by mouth twice a day Continue Aldactone and Lasix at current dosages Nutrition referral at daughter's request Plan follow up in office with Dr. Christella HartiganJacobs in 2-3 months.   Amy S Esterwood PA-C 04/22/2015   Cc: Andreas Blowerabeza, Yuri M., MD

## 2015-04-22 NOTE — Patient Instructions (Addendum)
We will call you with the lab results.   Decrease the Lactulose to 45 cc's twice daily. Take all other medications the same.  We are making a referral to a nutrionist. You will be getting a call.   We made you a follow up with Dr. Rob Buntinganiel Jacobs on 06-17-2015 at 11:15 am.

## 2015-04-23 NOTE — Progress Notes (Signed)
i agree with the above note, plan 

## 2015-04-25 LAB — AFP TUMOR MARKER: AFP-Tumor Marker: 2.3 ng/mL (ref <6.1)

## 2015-05-18 ENCOUNTER — Telehealth: Payer: Self-pay | Admitting: Physician Assistant

## 2015-05-18 NOTE — Telephone Encounter (Signed)
Patient is on mg BID. He continues to have diarrhea and at least 10 bm's daily. He is a little sluggish. Please advise.

## 2015-05-19 ENCOUNTER — Other Ambulatory Visit: Payer: Self-pay

## 2015-05-19 DIAGNOSIS — R197 Diarrhea, unspecified: Secondary | ICD-10-CM

## 2015-05-19 NOTE — Telephone Encounter (Signed)
Decrease lactulose to once daily, and have him do stool for cdiff PCR...you can put him on Jess schedule next week..Marland Kitchen

## 2015-05-19 NOTE — Telephone Encounter (Signed)
Tracy Gearheart instructed. No openings next week. He is scheduled to see Dr Christella HartiganJacobs 06/17/15.

## 2015-05-21 ENCOUNTER — Encounter (HOSPITAL_COMMUNITY): Payer: Self-pay | Admitting: Emergency Medicine

## 2015-05-21 ENCOUNTER — Emergency Department (HOSPITAL_COMMUNITY)
Admission: EM | Admit: 2015-05-21 | Discharge: 2015-05-21 | Payer: Medicare Other | Attending: Emergency Medicine | Admitting: Emergency Medicine

## 2015-05-21 ENCOUNTER — Emergency Department (HOSPITAL_COMMUNITY)
Admission: EM | Admit: 2015-05-21 | Discharge: 2015-05-21 | Discharge status: Absent without leave | Payer: Medicare Other | Attending: Emergency Medicine | Admitting: Emergency Medicine

## 2015-05-21 ENCOUNTER — Emergency Department (HOSPITAL_COMMUNITY): Payer: Medicare Other

## 2015-05-21 DIAGNOSIS — F919 Conduct disorder, unspecified: Secondary | ICD-10-CM | POA: Insufficient documentation

## 2015-05-21 DIAGNOSIS — E119 Type 2 diabetes mellitus without complications: Secondary | ICD-10-CM | POA: Diagnosis not present

## 2015-05-21 DIAGNOSIS — I10 Essential (primary) hypertension: Secondary | ICD-10-CM | POA: Diagnosis not present

## 2015-05-21 DIAGNOSIS — Z792 Long term (current) use of antibiotics: Secondary | ICD-10-CM | POA: Insufficient documentation

## 2015-05-21 DIAGNOSIS — R410 Disorientation, unspecified: Secondary | ICD-10-CM | POA: Diagnosis not present

## 2015-05-21 DIAGNOSIS — R451 Restlessness and agitation: Secondary | ICD-10-CM | POA: Insufficient documentation

## 2015-05-21 DIAGNOSIS — F0390 Unspecified dementia without behavioral disturbance: Secondary | ICD-10-CM | POA: Insufficient documentation

## 2015-05-21 DIAGNOSIS — Z046 Encounter for general psychiatric examination, requested by authority: Secondary | ICD-10-CM

## 2015-05-21 DIAGNOSIS — Z79899 Other long term (current) drug therapy: Secondary | ICD-10-CM | POA: Insufficient documentation

## 2015-05-21 DIAGNOSIS — F0391 Unspecified dementia with behavioral disturbance: Secondary | ICD-10-CM

## 2015-05-21 DIAGNOSIS — Z0289 Encounter for other administrative examinations: Secondary | ICD-10-CM | POA: Insufficient documentation

## 2015-05-21 DIAGNOSIS — Z01818 Encounter for other preprocedural examination: Secondary | ICD-10-CM

## 2015-05-21 DIAGNOSIS — Z7984 Long term (current) use of oral hypoglycemic drugs: Secondary | ICD-10-CM | POA: Diagnosis not present

## 2015-05-21 HISTORY — DX: Essential (primary) hypertension: I10

## 2015-05-21 HISTORY — DX: Type 2 diabetes mellitus without complications: E11.9

## 2015-05-21 HISTORY — DX: Unspecified dementia, unspecified severity, without behavioral disturbance, psychotic disturbance, mood disturbance, and anxiety: F03.90

## 2015-05-21 LAB — CBC
HEMATOCRIT: 28.2 % — AB (ref 39.0–52.0)
HEMOGLOBIN: 9.8 g/dL — AB (ref 13.0–17.0)
MCH: 29.3 pg (ref 26.0–34.0)
MCHC: 34.8 g/dL (ref 30.0–36.0)
MCV: 84.4 fL (ref 78.0–100.0)
Platelets: 113 10*3/uL — ABNORMAL LOW (ref 150–400)
RBC: 3.34 MIL/uL — ABNORMAL LOW (ref 4.22–5.81)
RDW: 19.9 % — AB (ref 11.5–15.5)
WBC: 6.3 10*3/uL (ref 4.0–10.5)

## 2015-05-21 LAB — COMPREHENSIVE METABOLIC PANEL
ALBUMIN: 3.5 g/dL (ref 3.5–5.0)
ALT: 23 U/L (ref 17–63)
ANION GAP: 9 (ref 5–15)
AST: 51 U/L — ABNORMAL HIGH (ref 15–41)
Alkaline Phosphatase: 68 U/L (ref 38–126)
BILIRUBIN TOTAL: 1.3 mg/dL — AB (ref 0.3–1.2)
BUN: 13 mg/dL (ref 6–20)
CO2: 17 mmol/L — AB (ref 22–32)
Calcium: 9.5 mg/dL (ref 8.9–10.3)
Chloride: 113 mmol/L — ABNORMAL HIGH (ref 101–111)
Creatinine, Ser: 0.91 mg/dL (ref 0.61–1.24)
GFR calc Af Amer: >60 mL/min (ref >60)
GFR calc non Af Amer: >60 mL/min (ref >60)
GLUCOSE: 102 mg/dL — AB (ref 65–99)
POTASSIUM: 4.6 mmol/L (ref 3.5–5.1)
SODIUM: 139 mmol/L (ref 135–145)
TOTAL PROTEIN: 7.3 g/dL (ref 6.5–8.1)

## 2015-05-21 LAB — CBG MONITORING, ED: Glucose-Capillary: 99 mg/dL (ref 65–99)

## 2015-05-21 LAB — URINALYSIS, ROUTINE W REFLEX MICROSCOPIC
Bilirubin Urine: NEGATIVE
Glucose, UA: NEGATIVE mg/dL
Hgb urine dipstick: NEGATIVE
KETONES UR: NEGATIVE mg/dL
LEUKOCYTES UA: NEGATIVE
NITRITE: NEGATIVE
PROTEIN: NEGATIVE mg/dL
Specific Gravity, Urine: 1.016 (ref 1.005–1.030)
pH: 5.5 (ref 5.0–8.0)

## 2015-05-21 LAB — RAPID URINE DRUG SCREEN, HOSP PERFORMED
Amphetamines: NOT DETECTED
BARBITURATES: NOT DETECTED
Benzodiazepines: NOT DETECTED
COCAINE: NOT DETECTED
Opiates: NOT DETECTED
TETRAHYDROCANNABINOL: NOT DETECTED

## 2015-05-21 LAB — SALICYLATE LEVEL: Salicylate Lvl: <4 mg/dL (ref 2.8–30.0)

## 2015-05-21 LAB — ETHANOL: Alcohol, Ethyl (B): <5 mg/dL (ref <5)

## 2015-05-21 LAB — ACETAMINOPHEN LEVEL

## 2015-05-21 MED ORDER — OXYCODONE HCL 5 MG PO TABS: 5.0000 mg | ORAL_TABLET | Freq: Two times a day (BID) | ORAL | Status: DC | PRN

## 2015-05-21 MED ORDER — ACETAMINOPHEN 325 MG PO TABS: 650.0000 mg | ORAL_TABLET | Freq: Four times a day (QID) | ORAL | Status: DC | PRN

## 2015-05-21 MED ORDER — SPIRONOLACTONE 50 MG PO TABS
50.0000 mg | ORAL_TABLET | Freq: Every day | ORAL | Status: DC
  Administered 2015-05-21 – 2015-05-22 (×2): 50 mg via ORAL
  Filled 2015-05-21 (×2): qty 1

## 2015-05-21 MED ORDER — ESCITALOPRAM OXALATE 10 MG PO TABS
5.0000 mg | ORAL_TABLET | Freq: Every day | ORAL | Status: DC
  Administered 2015-05-21 – 2015-05-22 (×2): 5 mg via ORAL
  Filled 2015-05-21 (×2): qty 1

## 2015-05-21 MED ORDER — QUETIAPINE FUMARATE 25 MG PO TABS
25.0000 mg | ORAL_TABLET | Freq: Two times a day (BID) | ORAL | Status: DC
  Administered 2015-05-21 – 2015-05-22 (×3): 25 mg via ORAL
  Filled 2015-05-21 (×3): qty 1

## 2015-05-21 MED ORDER — LACTULOSE 10 GM/15ML PO SOLN
10.0000 g | Freq: Every day | ORAL | Status: DC
  Administered 2015-05-21 – 2015-05-22 (×2): 10 g via ORAL
  Filled 2015-05-21 (×2): qty 15

## 2015-05-21 MED ORDER — RIFAXIMIN 550 MG PO TABS
550.0000 mg | ORAL_TABLET | Freq: Two times a day (BID) | ORAL | Status: DC
  Administered 2015-05-21 – 2015-05-22 (×3): 550 mg via ORAL
  Filled 2015-05-21 (×5): qty 1

## 2015-05-21 MED ORDER — LINAGLIPTIN 5 MG PO TABS
5.0000 mg | ORAL_TABLET | Freq: Every day | ORAL | Status: DC
  Administered 2015-05-21 – 2015-05-22 (×2): 5 mg via ORAL
  Filled 2015-05-21 (×2): qty 1

## 2015-05-21 MED ORDER — FUROSEMIDE 40 MG PO TABS
20.0000 mg | ORAL_TABLET | Freq: Every day | ORAL | Status: DC
  Administered 2015-05-21 – 2015-05-22 (×2): 20 mg via ORAL
  Filled 2015-05-21 (×2): qty 1

## 2015-05-21 MED ORDER — CARVEDILOL 12.5 MG PO TABS
12.5000 mg | ORAL_TABLET | Freq: Two times a day (BID) | ORAL | Status: DC
  Administered 2015-05-21 – 2015-05-22 (×3): 12.5 mg via ORAL
  Filled 2015-05-21 (×5): qty 1
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
## 2015-05-21 NOTE — BH Assessment (Signed)
Assessment completed. Consulted Alberteen SamFran Hobson, NP who recommended that pt be evaluated by the psychiatrist. Informed Dr. Karma GanjaLinker of the recommendation.

## 2015-05-21 NOTE — ED Notes (Signed)
Pt's contact--- Adair LaundryLatonya (caregiver): tel# [phone]

## 2015-05-21 NOTE — ED Notes (Signed)
TTS consult completed 
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
## 2015-05-22 NOTE — ED Notes (Signed)
Pt resting comfortably, sitter at bedside.  

## 2015-05-22 NOTE — ED Notes (Signed)
Caregiver at bedside.  [name] called to have pt's EKG and IVC papers faxed over

## 2015-05-22 NOTE — Progress Notes (Signed)
Disposition CSW completed patient referrals to the following [facility] facilities:  [facility]  CSW will continue to follow patient for placement needs.  Seward SpeckLeo Mohini Heathcock [facility] Disposition CSW [phone]

## 2015-05-22 NOTE — BH Assessment (Addendum)
Patient was reassessed by TTS.   Patient was in his bed in his room facing the wall and did not answer when his name was called. When this writer tapped patient on the shoulder he states "i can't hear you ma'am." Patient then states "I wear a hearing aid and i don't have it right now, they are putting it back together and will have it done today." Then patient states "It should be done around 2:30 or 3:30." Patient states that he is unable to participate in the assessment due to not being able to hear until he has his hearing aid. Patients nurse informed and states that she will communicate with his caregiver to see if he needs a hearing aid.   Davina PokeJoVea Sueanne Maniaci, LCSW Therapeutic Triage Specialist [facility] 05/22/2015 11:27 AM

## 2015-05-22 NOTE — ED Notes (Signed)
Bed confirmed by Delorise [facility]inerGrace at [facility], and report given to her.  Sheriff contacted for transport and left a message.

## 2015-05-22 NOTE — ED Notes (Signed)
Sgt. Paschal returned call, states he will be here between 1600-1630 for transport.

## 2015-05-22 NOTE — ED Notes (Signed)
TTS at bedside. 

## 2015-05-22 NOTE — ED Notes (Signed)
Pt refused to eat breakfast at this time.

## 2015-05-22 NOTE — ED Notes (Signed)
Pt is ambulatory to the BR with steady gait.

## 2015-05-25 ENCOUNTER — Encounter: Payer: Self-pay | Admitting: Diagnostic Neuroimaging

## 2015-05-25 ENCOUNTER — Encounter: Payer: Self-pay | Admitting: Physician Assistant

## 2015-05-25 ENCOUNTER — Telehealth: Payer: Self-pay | Admitting: Diagnostic Neuroimaging

## 2015-05-25 ENCOUNTER — Ambulatory Visit (INDEPENDENT_AMBULATORY_CARE_PROVIDER_SITE_OTHER): Payer: Medicare Other | Admitting: Diagnostic Neuroimaging

## 2015-05-25 VITALS — BP 91/51 | HR 67 | Wt 186.0 lb

## 2015-05-25 DIAGNOSIS — K7030 Alcoholic cirrhosis of liver without ascites: Secondary | ICD-10-CM

## 2015-05-25 DIAGNOSIS — F0391 Unspecified dementia with behavioral disturbance: Secondary | ICD-10-CM | POA: Diagnosis not present

## 2015-05-25 DIAGNOSIS — K7682 Hepatic encephalopathy: Secondary | ICD-10-CM

## 2015-05-25 DIAGNOSIS — R413 Other amnesia: Secondary | ICD-10-CM | POA: Diagnosis not present

## 2015-05-25 DIAGNOSIS — K7290 Hepatic failure, unspecified without coma: Secondary | ICD-10-CM | POA: Diagnosis not present

## 2015-05-25 DIAGNOSIS — F1097 Alcohol use, unspecified with alcohol-induced persisting dementia: Secondary | ICD-10-CM | POA: Diagnosis not present

## 2015-05-25 DIAGNOSIS — F1027 Alcohol dependence with alcohol-induced persisting dementia: Secondary | ICD-10-CM

## 2015-05-25 NOTE — Telephone Encounter (Signed)
Patient was in the office with his daughter today. [facility] . Left Message for Baird LyonsCasey and Byrd HesselbachMaria to call patient's daughter as soon as they could Relayed daughter's name and telephone number.
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
## 2015-05-25 NOTE — Patient Instructions (Signed)
-   follow up with PCP, GI and psychiatry in [place]

## 2015-05-26 ENCOUNTER — Telehealth: Payer: Self-pay | Admitting: Diagnostic Neuroimaging

## 2015-05-26 ENCOUNTER — Other Ambulatory Visit: Payer: Medicare Other

## 2015-05-26 ENCOUNTER — Ambulatory Visit: Payer: Self-pay | Admitting: Dietician

## 2015-05-26 DIAGNOSIS — R197 Diarrhea, unspecified: Secondary | ICD-10-CM

## 2015-05-26 NOTE — Telephone Encounter (Signed)
Keith FreestoneMaria [facility] and [facility] has been reached out to [phone] Hospice left messages's . Daughter has not returned Call. Keith [name] from Hospice will try to call again today.

## 2015-05-26 NOTE — Telephone Encounter (Signed)
Keith HesselbachMaria called back form [facility] she spoke to daughter .Daughter relayed she will start fresh when she moves [place] she declined referral.

## 2015-05-27 LAB — CLOSTRIDIUM DIFFICILE BY PCR: CDIFFPCR: NOT DETECTED

## 2015-06-17 ENCOUNTER — Ambulatory Visit: Payer: Self-pay | Admitting: Gastroenterology

## 2016-03-21 IMAGING — DX DG ABD PORTABLE 1V
1 series · 1 of 1 positions shown · non-contrast
Comparison: 08/08/2014

CLINICAL DATA: Ileus

EXAM:
PORTABLE ABDOMEN - 1 VIEW

[abdomen kub]
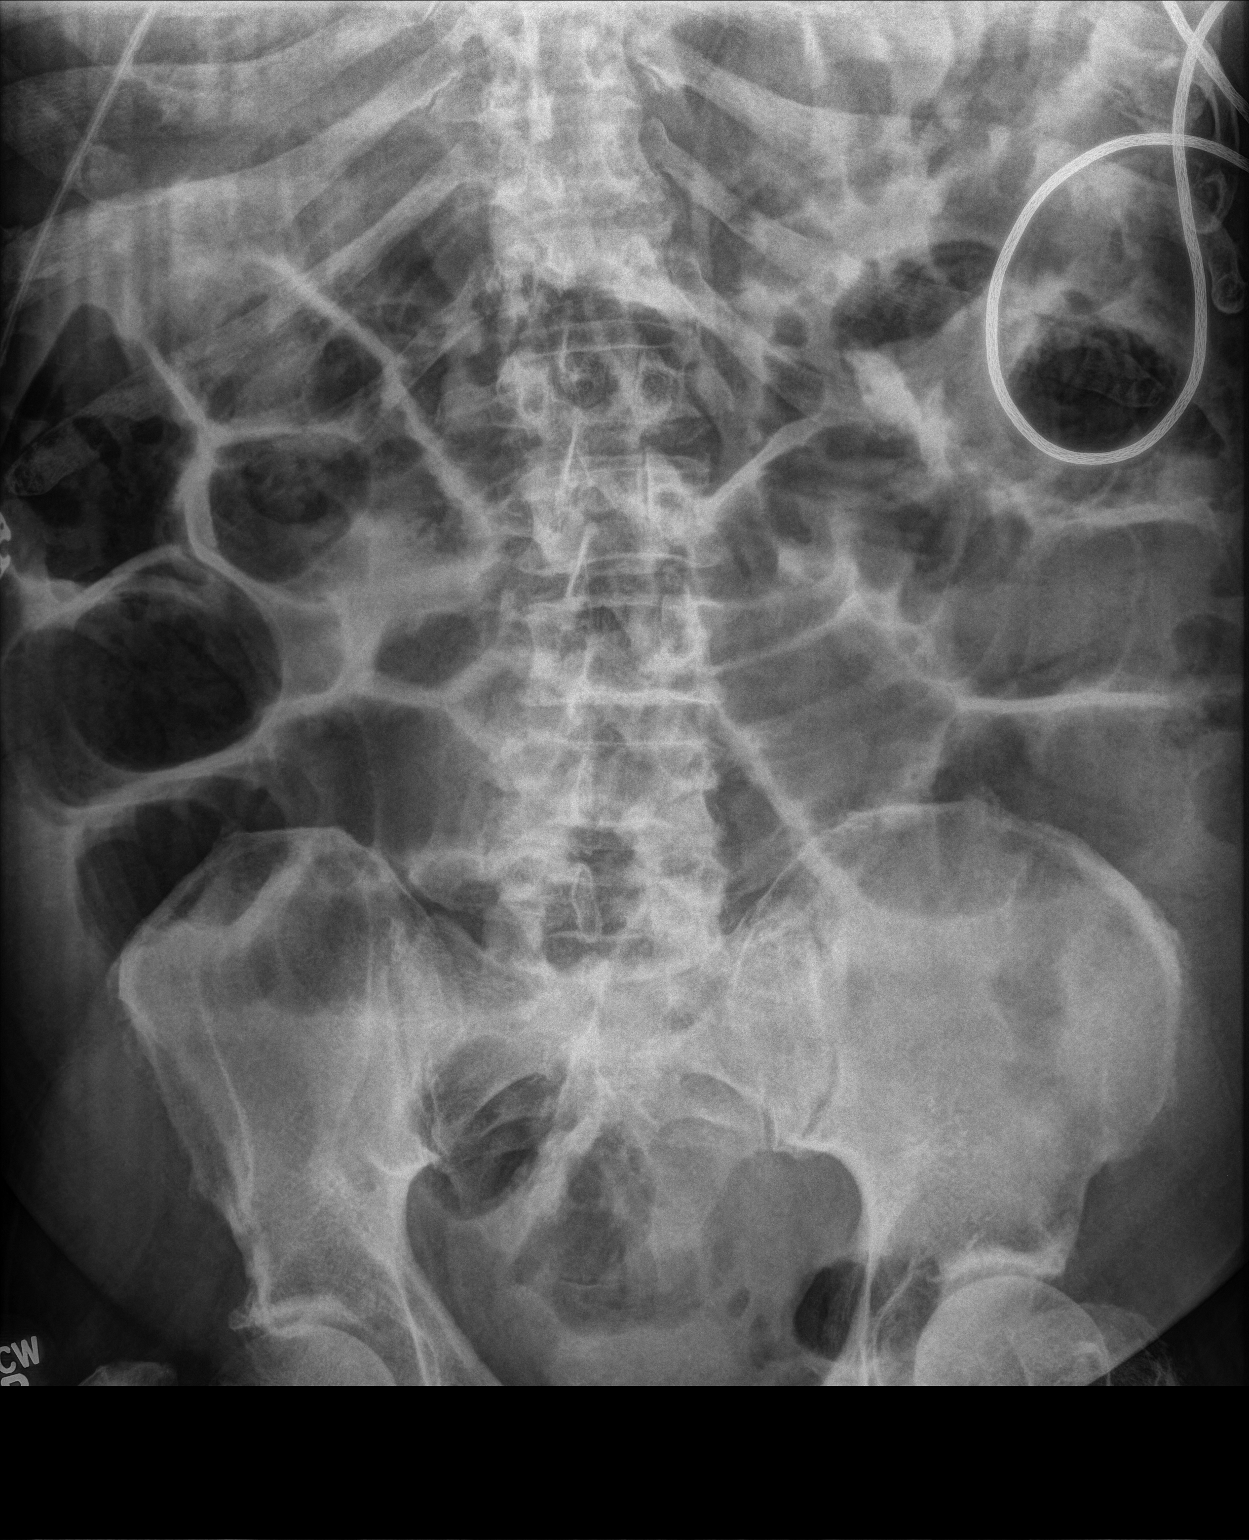

[1 of 1 positions shown; findings below may reference images not displayed]

FINDINGS: Persistent gaseous distended small bowel loops consistent with
significant ileus or partial small bowel obstruction.
IMPRESSION: Persistent gaseous distended small bowel loops consistent with
significant ileus or small bowel obstruction.

## 2016-03-21 IMAGING — US US ABDOMEN LIMITED
1 series · 14 of 18 positions shown · non-contrast
Comparison: CT scan 08/03/2014

CLINICAL DATA: Ascites, potential paracentesis

EXAM:
LIMITED ABDOMINAL ULTRASOUND

[Series 1: us abdomen limited · 0.24mm/px · 14 of 18 slices shown]
[im 1/18]
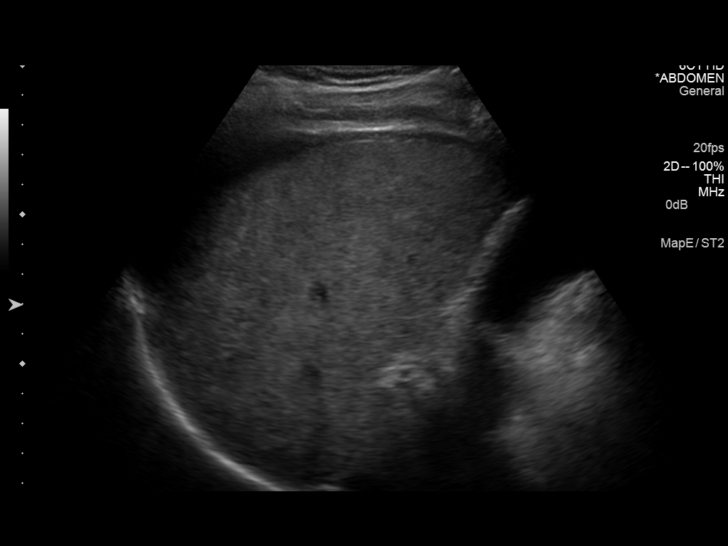
[im 2/18]
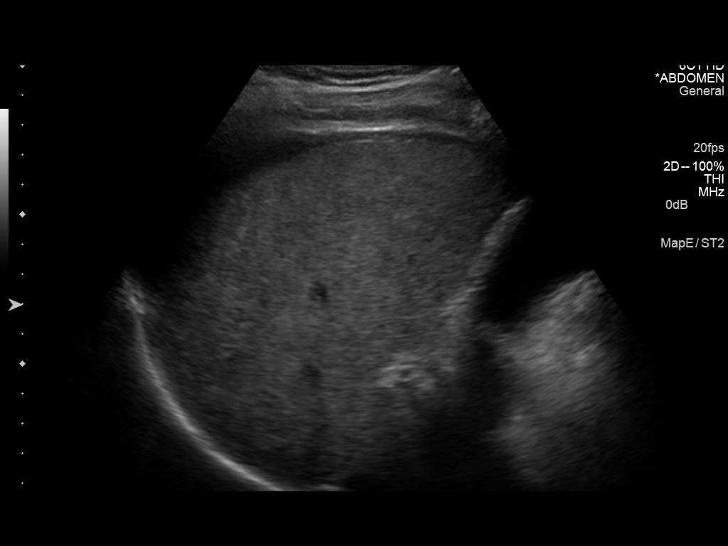
[im 4/18]
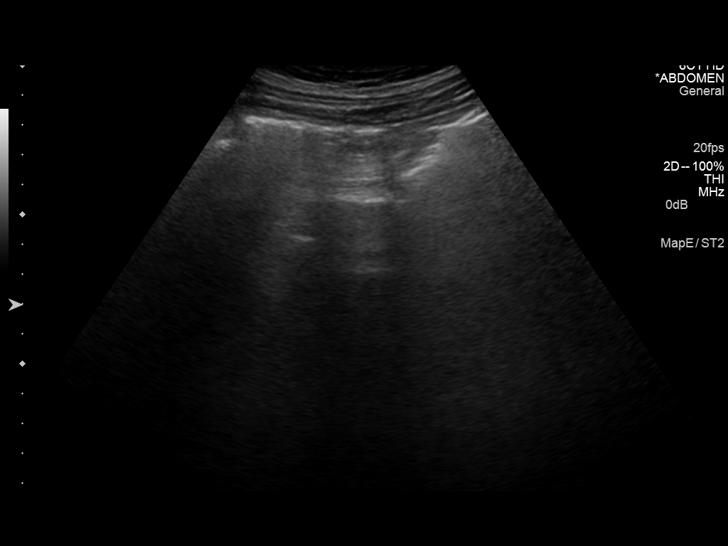
[im 5/18]
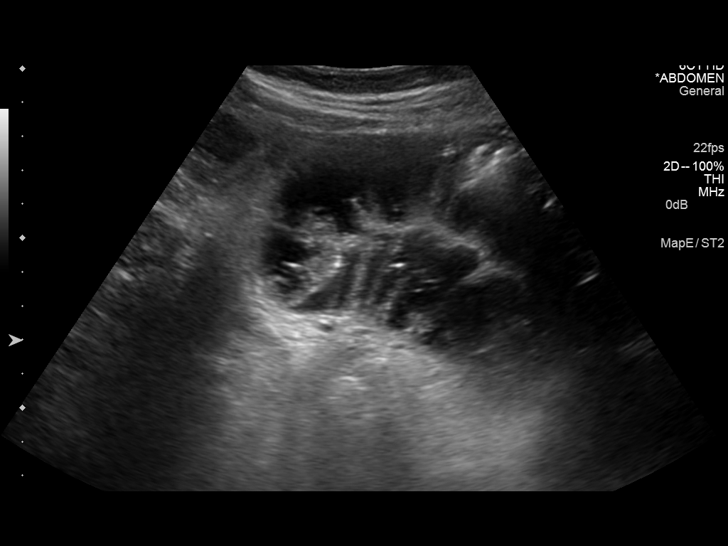
[im 6/18]
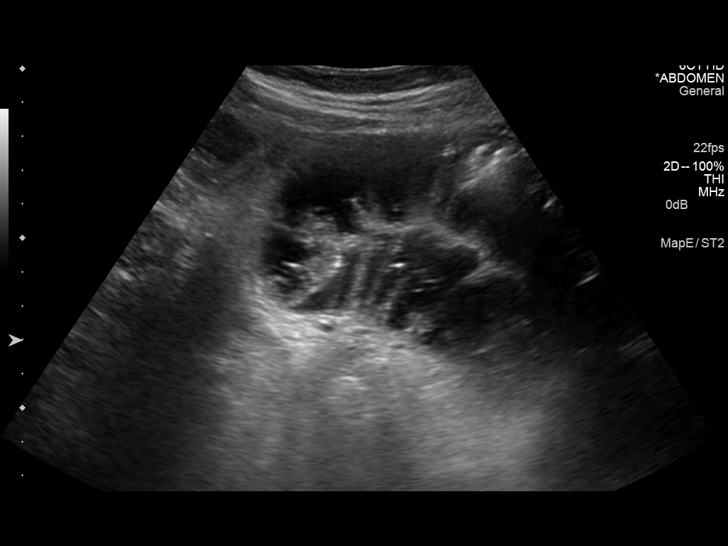
[im 8/18]
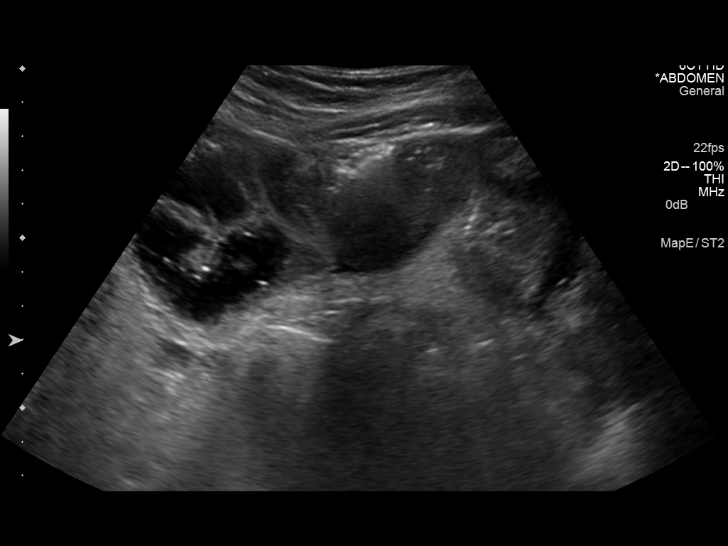
[im 9/18]
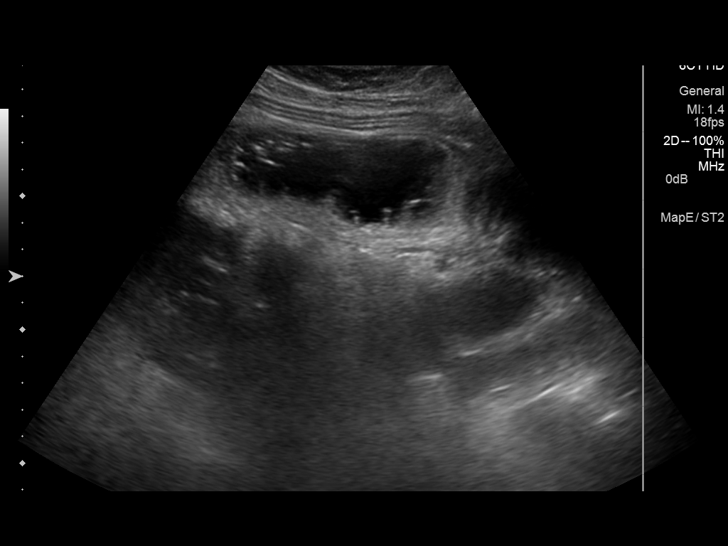
[im 10/18]
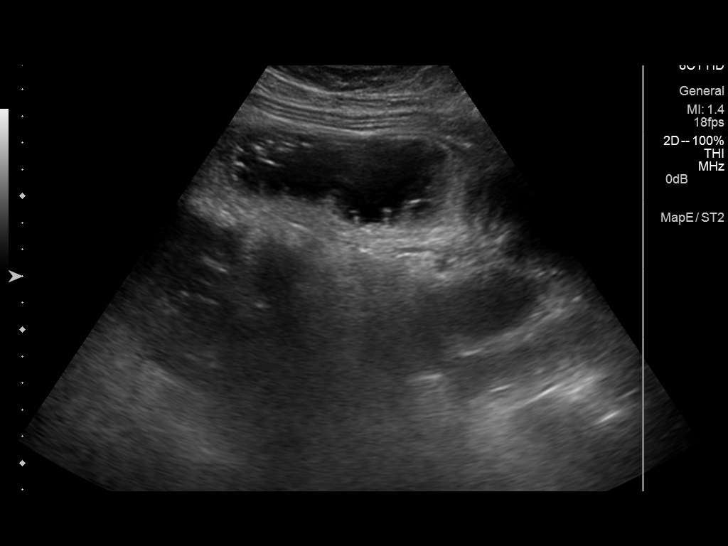
[im 11/18]
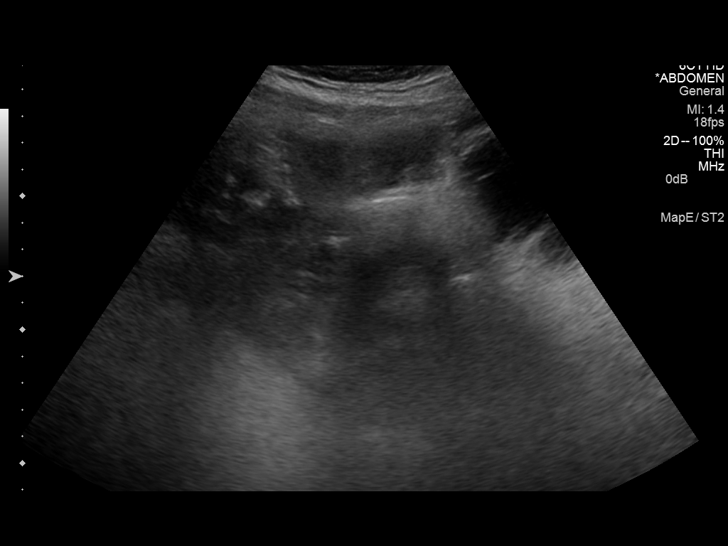
[im 13/18]
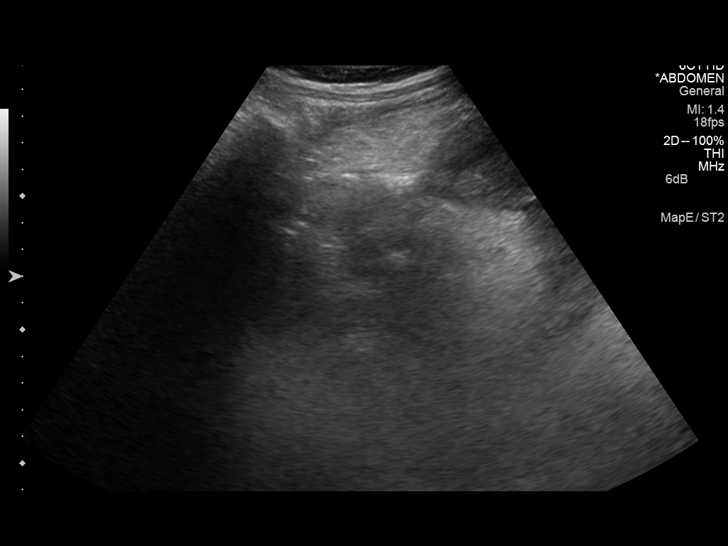
[im 14/18]
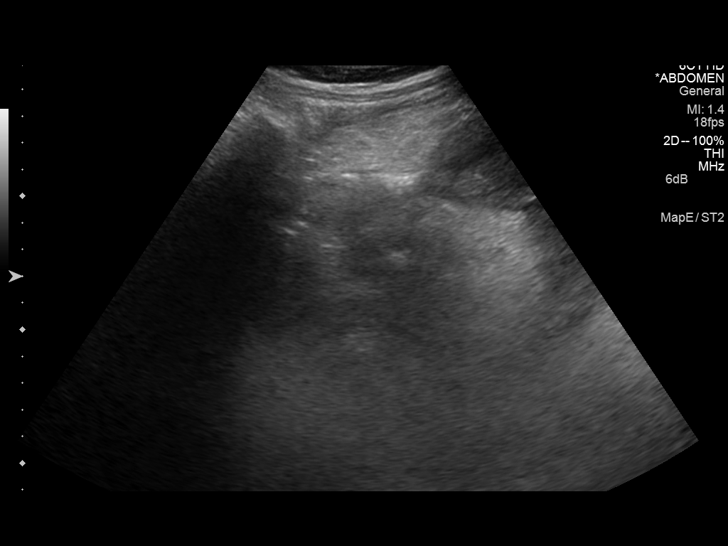
[im 15/18]
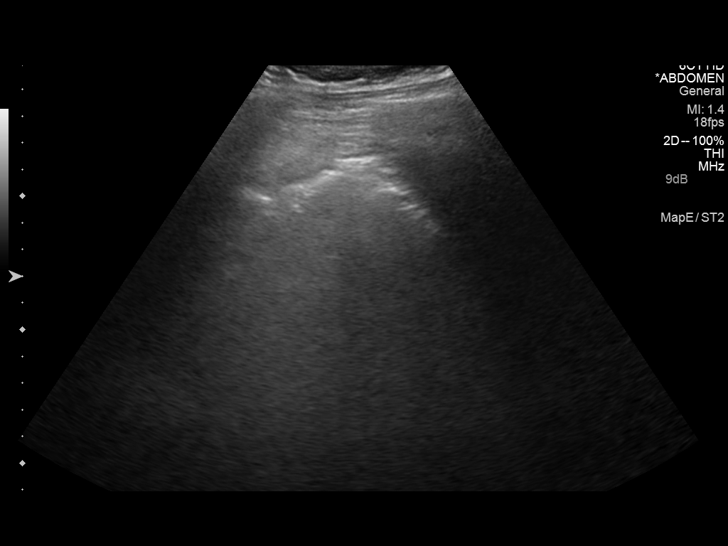
[im 17/18]
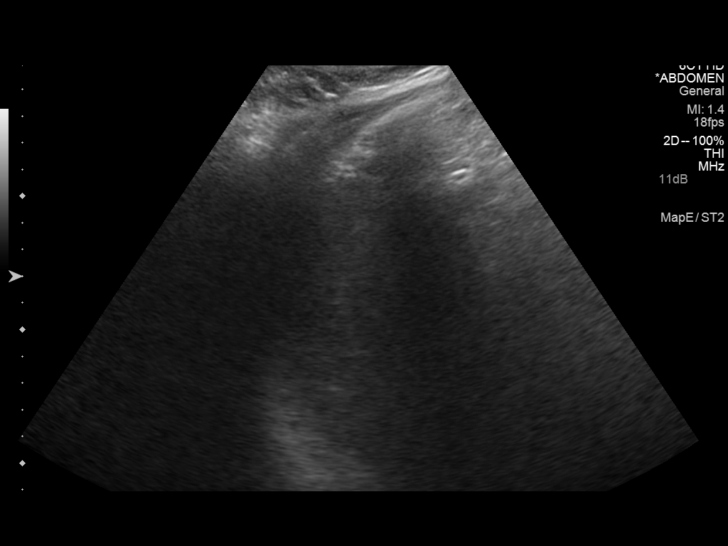
[im 18/18]
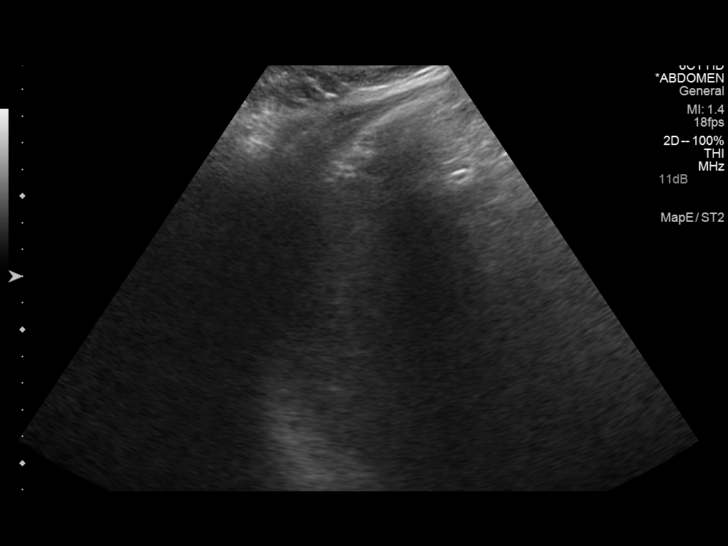

[14 of 18 positions shown; findings below may reference images not displayed]

FINDINGS: There is only small amount of perihepatic ascites not enough for
paracentesis. No ascites is noted in lower quadrants of the abdomen.
IMPRESSION: Only small amount of perihepatic ascites. No ascites is noted in
lower quadrants of the abdomen.

## 2016-03-22 IMAGING — DX DG CHEST 1V PORT
1 series · 1 of 1 positions shown · non-contrast
Comparison: 08/06/2014

CLINICAL DATA: Leukocytosis, CHF

EXAM:
PORTABLE CHEST - 1 VIEW

[chest ap]
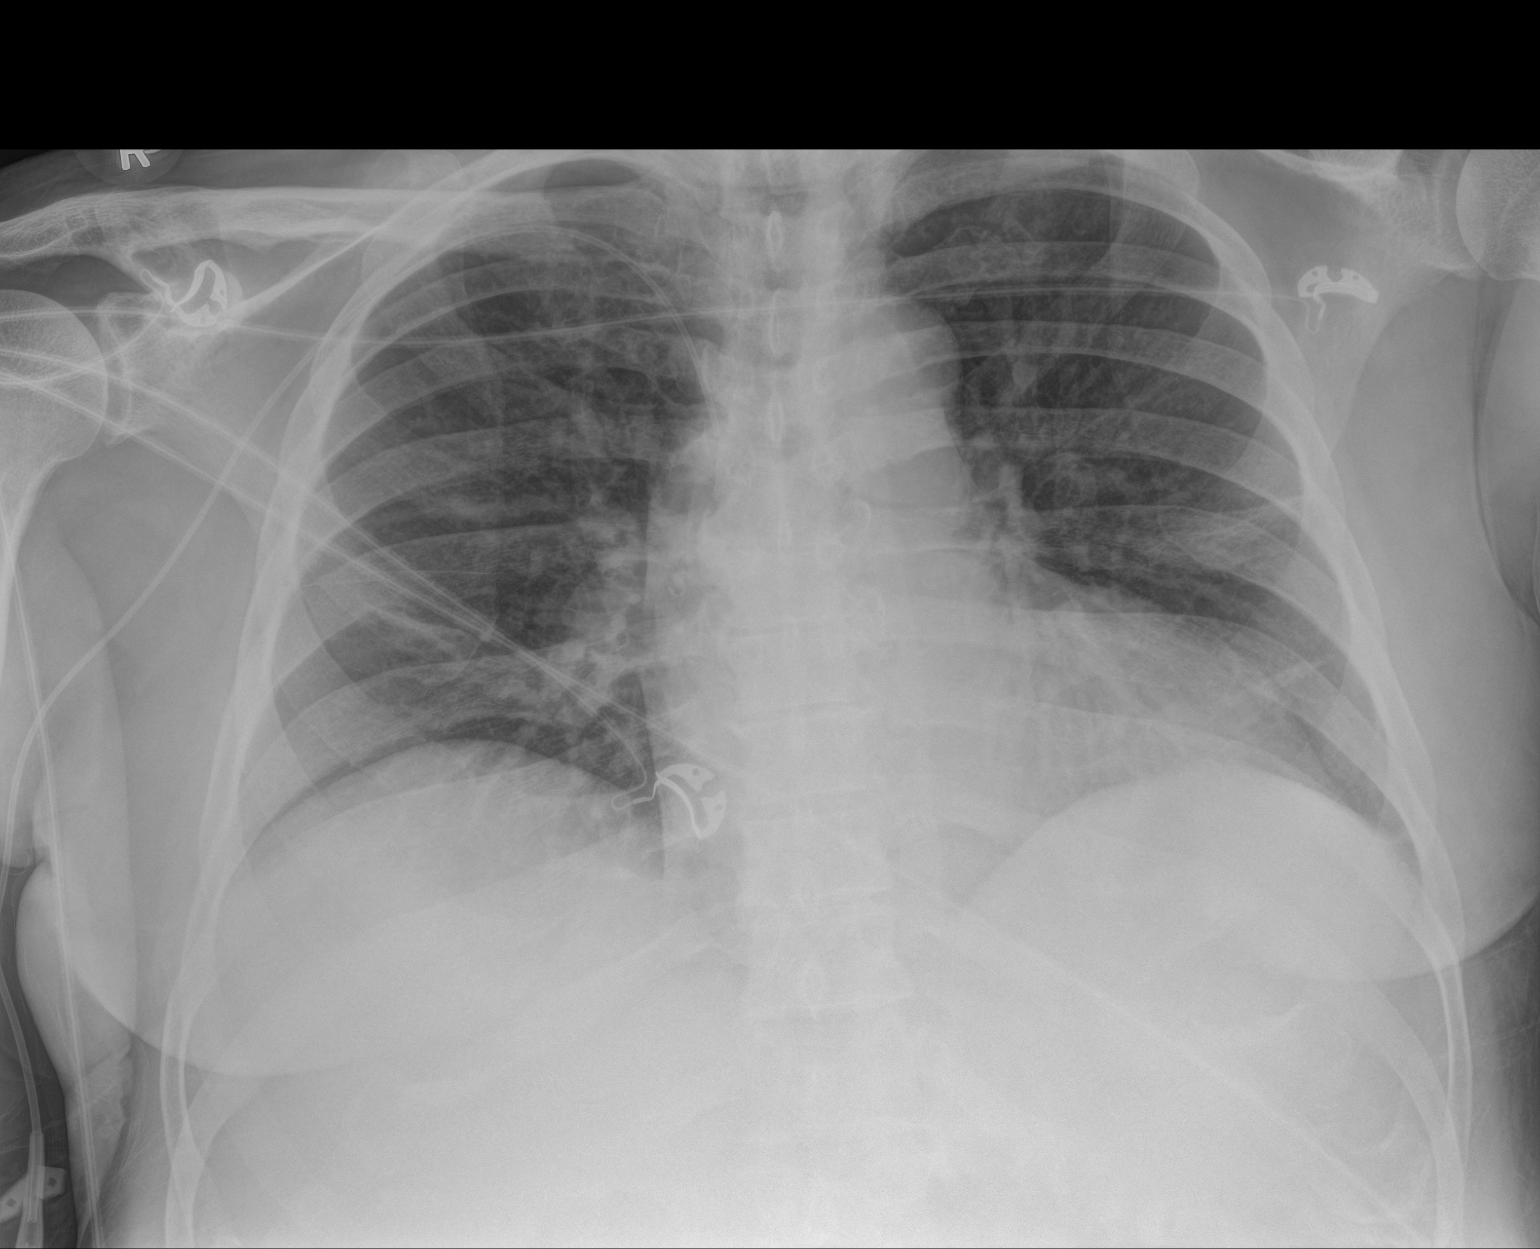

[1 of 1 positions shown; findings below may reference images not displayed]

FINDINGS: Cardiac shadow is stable. The nasogastric catheter is been removed.
A right-sided PICC line is again identified in the proximal superior
vena cava. The lungs are well aerated bilaterally. Some very minimal
linear atelectasis is noted in the bases bilaterally and stable. No
focal confluent infiltrate is seen.
IMPRESSION: Interval removal of nasogastric catheter. Bibasilar atelectatic
changes are again seen.

## 2016-03-25 IMAGING — DX DG ABD PORTABLE 1V
1 series · 1 of 1 positions shown · non-contrast
Comparison: 08/10/2014.

CLINICAL DATA: Ileus.

EXAM:
PORTABLE ABDOMEN - 1 VIEW

[abdomen kub]
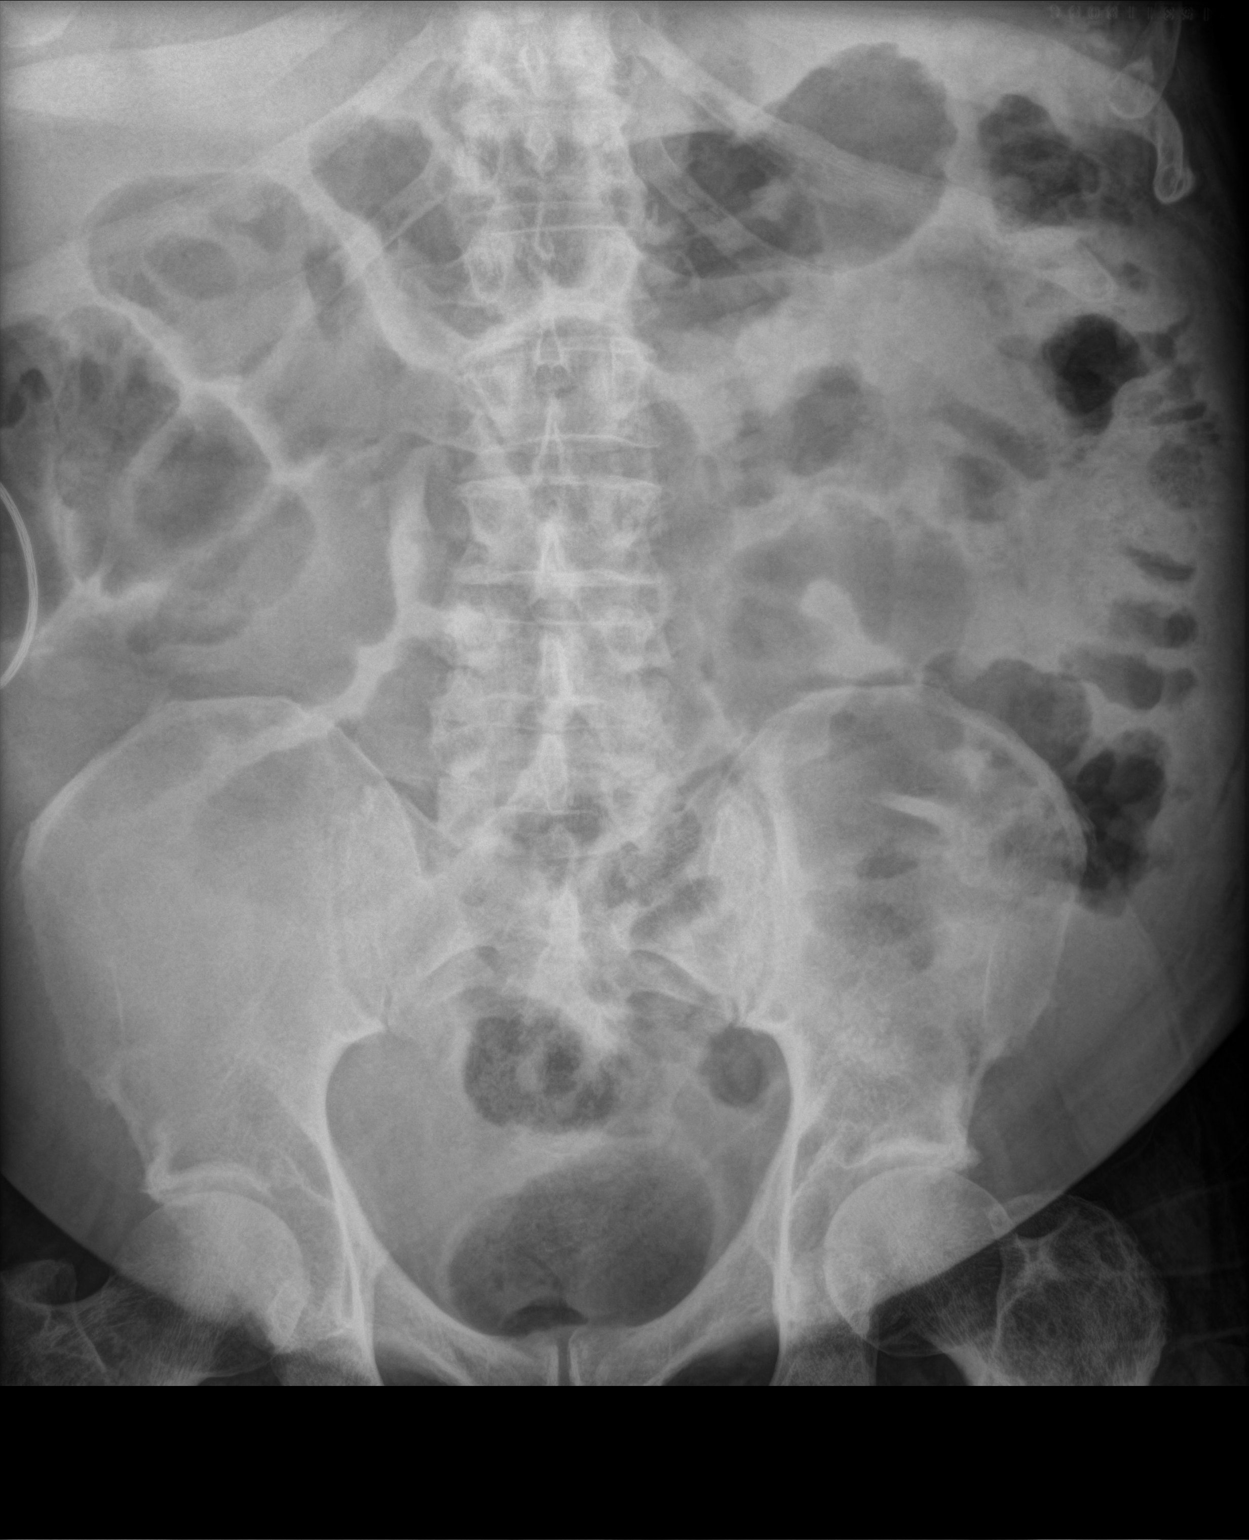

[1 of 1 positions shown; findings below may reference images not displayed]

FINDINGS: Soft tissue structures are unremarkable. Continued improvement of
small and large bowel distention. Findings consistent with improving
adynamic ileus. No pathologic intra-abdominal calcifications noted.
No acute bony abnormality.
IMPRESSION: Findings consistent with continued improvement of adynamic ileus.

## 2016-10-05 IMAGING — MR MR CERVICAL SPINE W/O CM
4 of 5 series · 27 of 48 positions shown · non-contrast
Comparison: CT of the neck 05/22/2012.

CLINICAL DATA: Right-sided neck and arm pain for several months.
Radiculopathy.

EXAM:
MRI CERVICAL SPINE WITHOUT CONTRAST
TECHNIQUE: Multiplanar, multisequence MR imaging of the cervical spine was
performed. No intravenous contrast was administered.

[Series 5: T1 · sagittal · 3.0mm · 0.66mm/px · 6 of 15 slices shown]
[im 1/15]
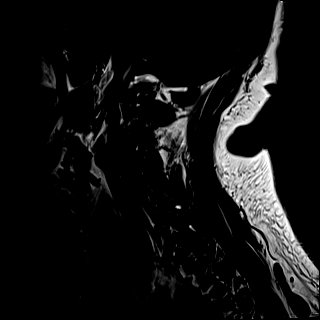
[im 3/15]
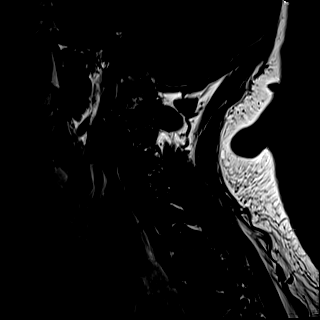
[im 6/15]
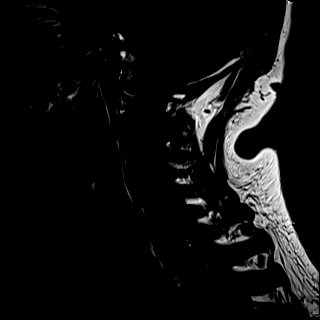
[im 9/15]
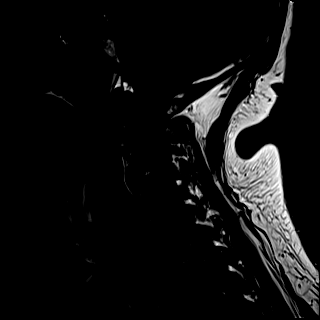
[im 12/15]
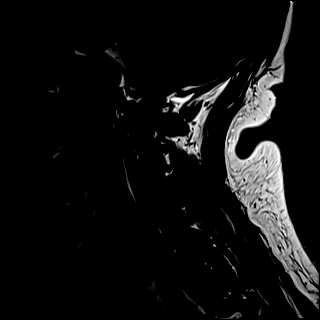
[im 15/15]
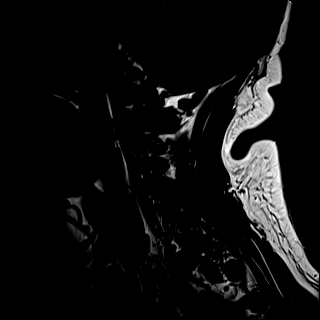

[Series 6: T2 · sagittal · 3.0mm · 0.55mm/px · 7 of 15 slices shown (1 of 2)]
[im 1/15]
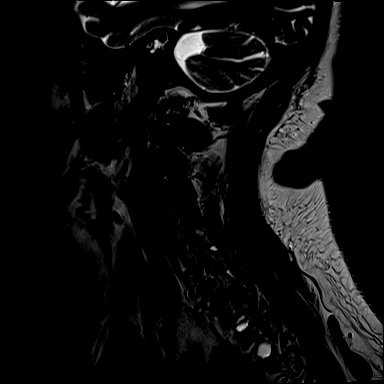
[im 3/15]
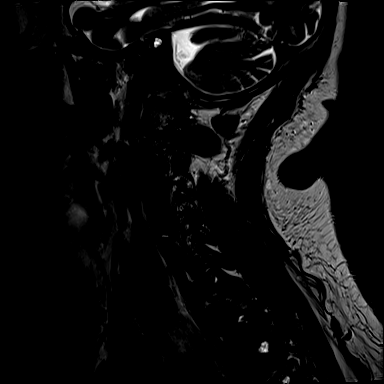
[im 5/15]
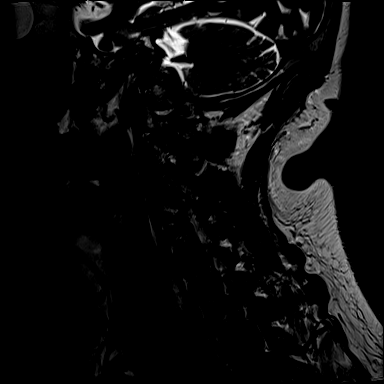
[im 8/15]
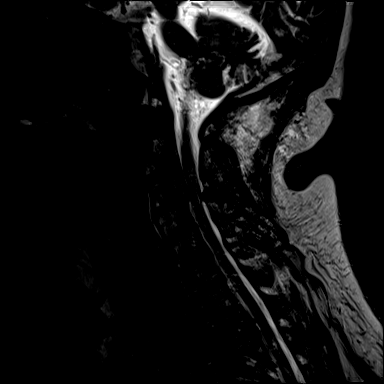
[im 10/15]
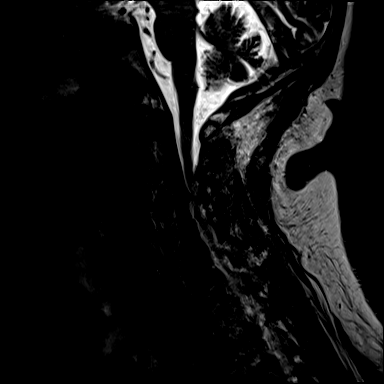
[im 12/15]
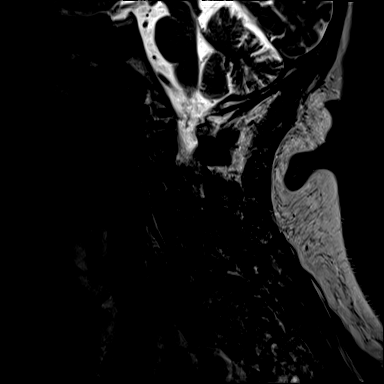
[im 15/15]
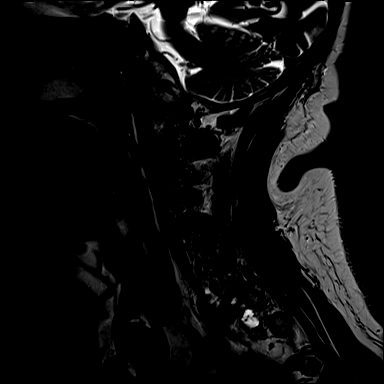

[Series 7: T2 · axial · 3.0mm · 0.50mm/px · z∈[-58,+32]mm · 8 of 31 slices shown (2 of 2)]
[im 1/31]
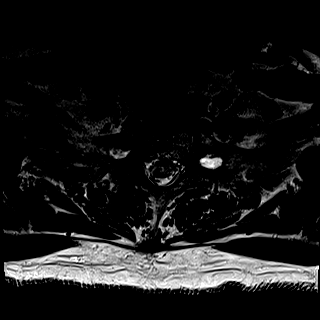
[im 5/31]
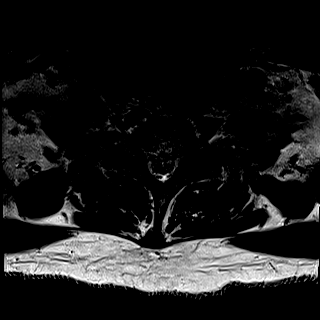
[im 10/31]
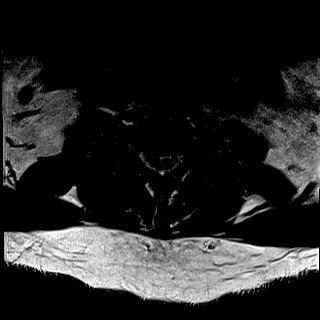
[im 14/31]
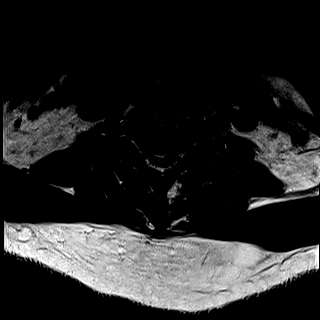
[im 17/31]
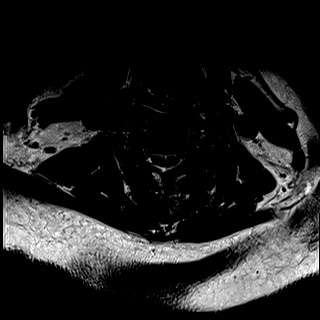
[im 21/31]
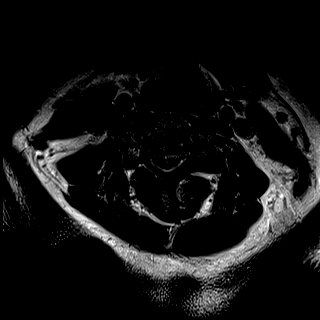
[im 26/31]
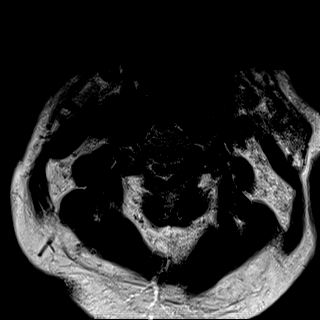
[im 31/31]
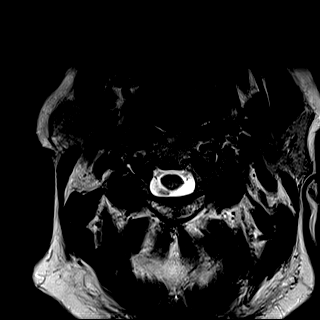

[Series 8: STIR · sagittal · 3.0mm · 0.33mm/px · 6 of 15 slices shown]
[im 1/15]
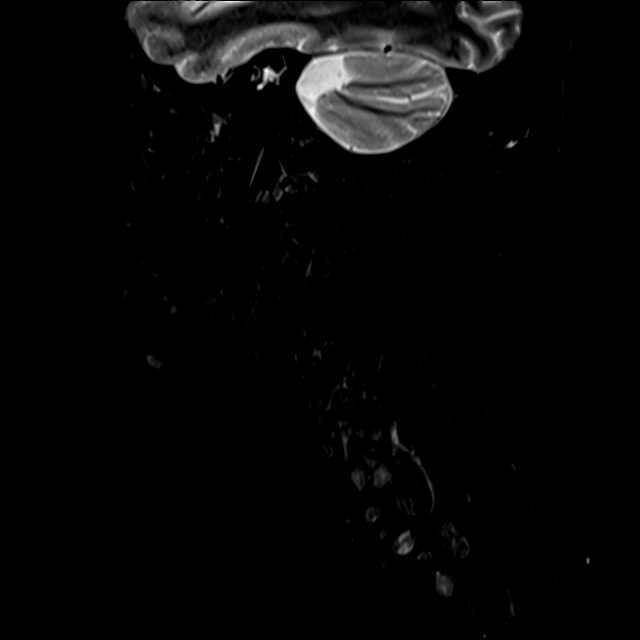
[im 3/15]
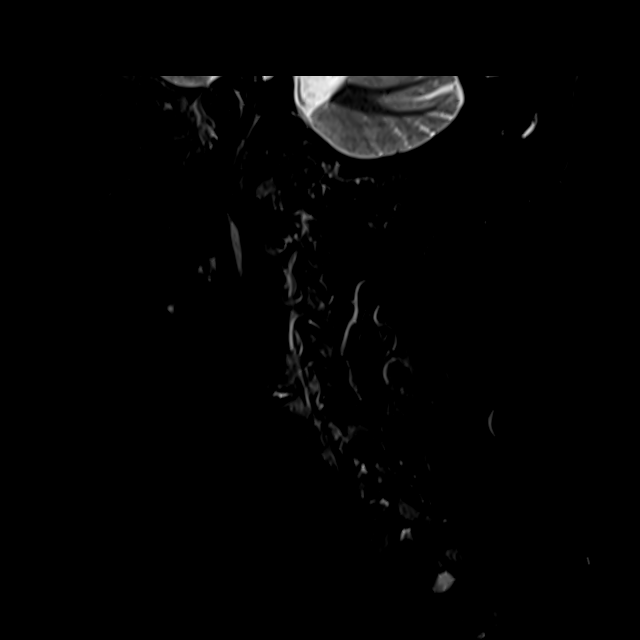
[im 5/15]
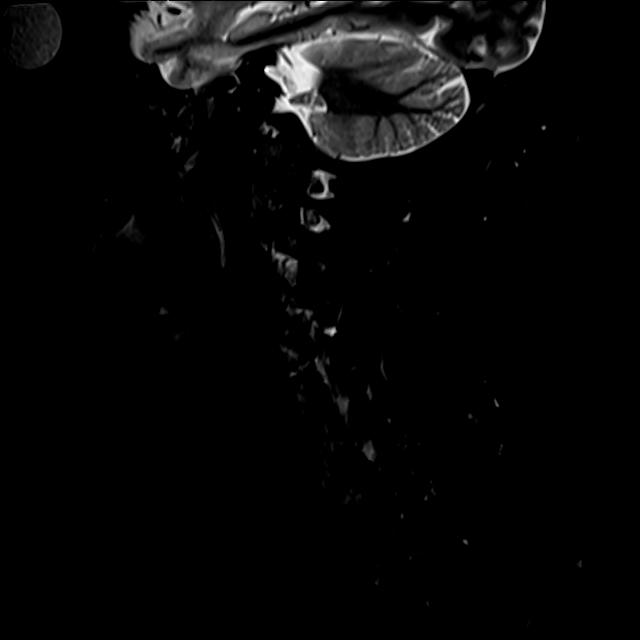
[im 8/15]
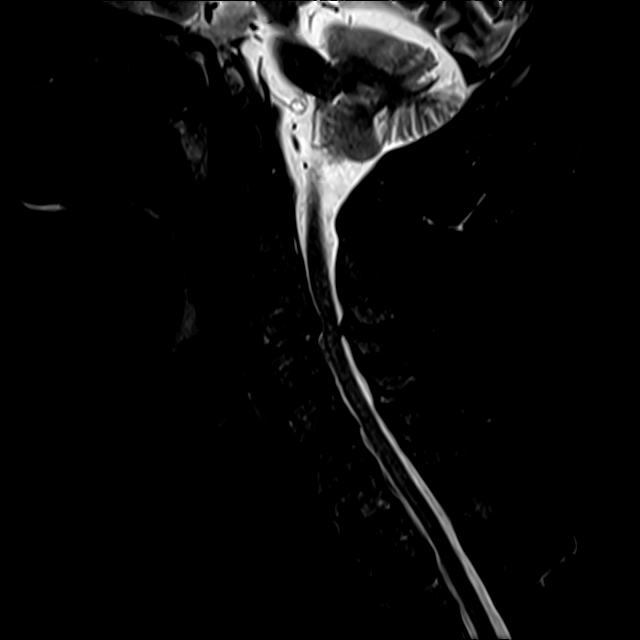
[im 10/15]
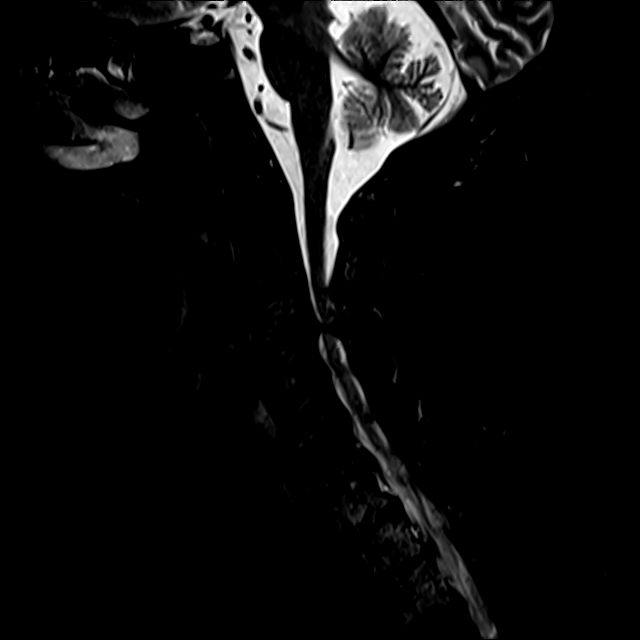
[im 12/15]
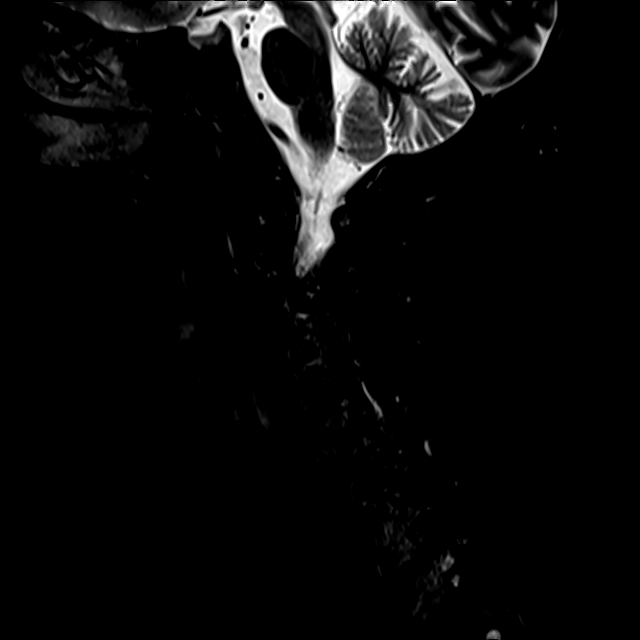

[27 of 48 positions shown; findings below may reference images not displayed]

FINDINGS: The study is mildly degraded by patient motion. There is focal cord
signal abnormality at the C3-4 level. Grade 1 retrolisthesis is
present at the same level, measuring 3.5 mm. Alignment is otherwise
anatomic. There is chronic marrow change at C7-T1 and T1-2.

Craniocervical junction is within normal limits. Visualized
intracranial contents are normal.

Flow is present in the vertebral arteries bilaterally.

Uncovertebral and facet disease results in moderate left foraminal
stenosis. The central canal and right foramen are patent.

Retrolisthesis and a broad-based disc osteophyte complex narrows the
canal to 6 mm. Moderate left and mild right foraminal stenosis is
present.

C4-5: A broad-based disc osteophyte complex is present. There is
partial effacement of the ventral CSF. Moderate left and mild right
foraminal stenosis is present.

C5-6: A broad-based disc osteophyte complex partially effaces the
ventral CSF. Moderate foraminal stenosis is present bilaterally,
right greater than left due to uncovertebral and facet hypertrophy.

C6-7: A broad-based disc osteophyte complex is present. There is
partial effacement of ventral CSF. Uncovertebral and facet disease
leads to moderate foraminal narrowing bilaterally, worse on the
left.

C7-T1: Moderate facet hypertrophy is noted bilaterally. There is
moderate foraminal stenosis bilaterally.
IMPRESSION: 1. Moderate to severe central canal stenosis at C3-4 secondary to 3
mm retrolisthesis. The canal is narrowed to 6 mm.
2. Multilevel foraminal stenosis bilaterally due to uncovertebral
and facet disease as described.
3. Most severe right-sided disease is at C5-6 and C6-7.

## 2023-09-06 ENCOUNTER — Encounter: Payer: Self-pay | Admitting: Advanced Practice Midwife
